# Patient Record
Sex: Male | Born: 1950 | Race: Black or African American | Hispanic: No | Marital: Married | Smoking: Current every day smoker
Health system: Southern US, Community
[De-identification: ages and names within clinical notes are randomized; demographics above are authoritative.]

## PROBLEM LIST (undated history)

## (undated) DIAGNOSIS — F329 Major depressive disorder, single episode, unspecified: Secondary | ICD-10-CM

## (undated) DIAGNOSIS — I10 Essential (primary) hypertension: Principal | ICD-10-CM

## (undated) DIAGNOSIS — C679 Malignant neoplasm of bladder, unspecified: Secondary | ICD-10-CM

## (undated) DIAGNOSIS — J302 Other seasonal allergic rhinitis: Secondary | ICD-10-CM

## (undated) DIAGNOSIS — F32A Depression, unspecified: Secondary | ICD-10-CM

## (undated) DIAGNOSIS — F17200 Nicotine dependence, unspecified, uncomplicated: Secondary | ICD-10-CM

## (undated) DIAGNOSIS — M1990 Unspecified osteoarthritis, unspecified site: Secondary | ICD-10-CM

## (undated) DIAGNOSIS — C61 Malignant neoplasm of prostate: Secondary | ICD-10-CM

## (undated) HISTORY — DX: Essential (primary) hypertension: I10

## (undated) HISTORY — DX: Malignant neoplasm of bladder, unspecified: C67.9

## (undated) HISTORY — PX: HERNIA REPAIR: SHX51

## (undated) HISTORY — DX: Nicotine dependence, unspecified, uncomplicated: F17.200

## (undated) HISTORY — PX: COLONOSCOPY: SHX174

## (undated) HISTORY — DX: Unspecified osteoarthritis, unspecified site: M19.90

## (undated) HISTORY — DX: Other seasonal allergic rhinitis: J30.2

---

## 1998-01-05 DIAGNOSIS — C679 Malignant neoplasm of bladder, unspecified: Secondary | ICD-10-CM

## 1998-01-05 HISTORY — DX: Malignant neoplasm of bladder, unspecified: C67.9

## 1998-01-05 HISTORY — PX: OTHER SURGICAL HISTORY: SHX169

## 2000-01-06 HISTORY — PX: OTHER SURGICAL HISTORY: SHX169

## 2000-05-13 ENCOUNTER — Other Ambulatory Visit: Admission: RE | Admit: 2000-05-13 | Discharge: 2000-05-13 | Payer: Self-pay | Admitting: Urology

## 2000-12-30 ENCOUNTER — Other Ambulatory Visit: Admission: RE | Admit: 2000-12-30 | Discharge: 2000-12-30 | Payer: Self-pay | Admitting: Urology

## 2002-01-05 HISTORY — PX: FOOT SURGERY: SHX648

## 2002-11-16 ENCOUNTER — Encounter: Payer: Self-pay | Admitting: Orthopedic Surgery

## 2002-12-27 ENCOUNTER — Encounter: Payer: Self-pay | Admitting: Orthopedic Surgery

## 2003-01-15 ENCOUNTER — Ambulatory Visit (HOSPITAL_COMMUNITY): Admission: RE | Admit: 2003-01-15 | Discharge: 2003-01-15 | Payer: Self-pay | Admitting: *Deleted

## 2005-03-06 ENCOUNTER — Ambulatory Visit (HOSPITAL_COMMUNITY): Admission: RE | Admit: 2005-03-06 | Discharge: 2005-03-06 | Payer: Self-pay | Admitting: General Surgery

## 2005-10-01 ENCOUNTER — Emergency Department (HOSPITAL_COMMUNITY): Admission: EM | Admit: 2005-10-01 | Discharge: 2005-10-01 | Payer: Self-pay | Admitting: Emergency Medicine

## 2005-10-26 ENCOUNTER — Ambulatory Visit: Payer: Self-pay | Admitting: Family Medicine

## 2005-11-16 ENCOUNTER — Encounter: Payer: Self-pay | Admitting: Family Medicine

## 2005-11-16 LAB — CONVERTED CEMR LAB: PSA: 1.42 ng/mL

## 2005-12-22 ENCOUNTER — Ambulatory Visit: Payer: Self-pay | Admitting: Family Medicine

## 2007-02-09 ENCOUNTER — Ambulatory Visit (HOSPITAL_COMMUNITY): Admission: RE | Admit: 2007-02-09 | Discharge: 2007-02-09 | Payer: Self-pay | Admitting: Family Medicine

## 2007-02-09 ENCOUNTER — Ambulatory Visit: Payer: Self-pay | Admitting: Family Medicine

## 2007-02-10 ENCOUNTER — Encounter: Payer: Self-pay | Admitting: Family Medicine

## 2007-02-10 LAB — CONVERTED CEMR LAB
BUN: 15 mg/dL (ref 6–23)
Basophils Absolute: 0 10*3/uL (ref 0.0–0.1)
Basophils Relative: 1 % (ref 0–1)
CO2: 23 meq/L (ref 19–32)
Calcium: 8.8 mg/dL (ref 8.4–10.5)
Chloride: 106 meq/L (ref 96–112)
Cholesterol: 189 mg/dL (ref 0–200)
Creatinine, Ser: 1.03 mg/dL (ref 0.40–1.50)
Eosinophils Absolute: 0.1 10*3/uL (ref 0.0–0.7)
Eosinophils Relative: 1 % (ref 0–5)
Glucose, Bld: 111 mg/dL — ABNORMAL HIGH (ref 70–99)
HCT: 47.4 % (ref 39.0–52.0)
HDL: 47 mg/dL (ref >39)
Hemoglobin: 15.5 g/dL (ref 13.0–17.0)
LDL Cholesterol: 127 mg/dL — ABNORMAL HIGH (ref 0–99)
Lymphocytes Relative: 29 % (ref 12–46)
Lymphs Abs: 1.9 10*3/uL (ref 0.7–4.0)
MCHC: 32.7 g/dL (ref 30.0–36.0)
MCV: 91.2 fL (ref 78.0–100.0)
Monocytes Absolute: 0.5 10*3/uL (ref 0.1–1.0)
Monocytes Relative: 8 % (ref 3–12)
Neutro Abs: 4 10*3/uL (ref 1.7–7.7)
Neutrophils Relative %: 62 % (ref 43–77)
PSA: 1.37 ng/mL (ref 0.10–4.00)
Platelets: 236 10*3/uL (ref 150–400)
Potassium: 4.2 meq/L (ref 3.5–5.3)
RBC: 5.2 M/uL (ref 4.22–5.81)
RDW: 13.8 % (ref 11.5–15.5)
Sodium: 142 meq/L (ref 135–145)
Total CHOL/HDL Ratio: 4
Triglycerides: 73 mg/dL (ref <150)
VLDL: 15 mg/dL (ref 0–40)
WBC: 6.5 10*3/uL (ref 4.0–10.5)

## 2007-02-11 ENCOUNTER — Encounter: Payer: Self-pay | Admitting: Family Medicine

## 2007-02-11 LAB — CONVERTED CEMR LAB: Hgb A1c MFr Bld: 6.4 % — ABNORMAL HIGH (ref 4.6–6.1)

## 2007-02-22 ENCOUNTER — Ambulatory Visit (HOSPITAL_COMMUNITY): Admission: RE | Admit: 2007-02-22 | Discharge: 2007-02-22 | Payer: Self-pay | Admitting: General Surgery

## 2007-02-22 ENCOUNTER — Encounter: Payer: Self-pay | Admitting: Family Medicine

## 2007-02-22 LAB — HM COLONOSCOPY: HM Colonoscopy: NORMAL

## 2007-02-25 ENCOUNTER — Encounter: Payer: Self-pay | Admitting: Family Medicine

## 2007-02-25 LAB — CONVERTED CEMR LAB
Glucose, 2 hour: 88 mg/dL (ref 70–139)
Glucose, Fasting: 115 mg/dL — ABNORMAL HIGH (ref 70–99)

## 2007-04-11 ENCOUNTER — Ambulatory Visit (HOSPITAL_COMMUNITY): Admission: RE | Admit: 2007-04-11 | Discharge: 2007-04-11 | Payer: Self-pay | Admitting: Family Medicine

## 2007-04-11 ENCOUNTER — Ambulatory Visit: Payer: Self-pay | Admitting: Family Medicine

## 2007-04-11 LAB — CONVERTED CEMR LAB
BUN: 11 mg/dL (ref 6–23)
Basophils Absolute: 0 10*3/uL (ref 0.0–0.1)
Basophils Relative: 1 % (ref 0–1)
CO2: 27 meq/L (ref 19–32)
Calcium: 8.7 mg/dL (ref 8.4–10.5)
Chloride: 102 meq/L (ref 96–112)
Creatinine, Ser: 1.06 mg/dL (ref 0.40–1.50)
Eosinophils Absolute: 0.1 10*3/uL (ref 0.0–0.7)
Eosinophils Relative: 2 % (ref 0–5)
Glucose, Bld: 156 mg/dL — ABNORMAL HIGH (ref 70–99)
HCT: 43.7 % (ref 39.0–52.0)
Hemoglobin: 14.8 g/dL (ref 13.0–17.0)
Lymphocytes Relative: 39 % (ref 12–46)
Lymphs Abs: 1.7 10*3/uL (ref 0.7–4.0)
MCHC: 33.9 g/dL (ref 30.0–36.0)
MCV: 89.7 fL (ref 78.0–100.0)
Monocytes Absolute: 0.5 10*3/uL (ref 0.1–1.0)
Monocytes Relative: 10 % (ref 3–12)
Neutro Abs: 2.1 10*3/uL (ref 1.7–7.7)
Neutrophils Relative %: 48 % (ref 43–77)
Platelets: 215 10*3/uL (ref 150–400)
Potassium: 3.8 meq/L (ref 3.5–5.3)
RBC: 4.87 M/uL (ref 4.22–5.81)
RDW: 14.1 % (ref 11.5–15.5)
Sodium: 136 meq/L (ref 135–145)
WBC: 4.4 10*3/uL (ref 4.0–10.5)

## 2007-04-12 ENCOUNTER — Encounter: Payer: Self-pay | Admitting: Family Medicine

## 2007-04-12 LAB — CONVERTED CEMR LAB: Hgb A1c MFr Bld: 6.2 % — ABNORMAL HIGH

## 2007-04-13 ENCOUNTER — Encounter: Payer: Self-pay | Admitting: Family Medicine

## 2007-04-13 DIAGNOSIS — F17200 Nicotine dependence, unspecified, uncomplicated: Secondary | ICD-10-CM

## 2007-04-13 DIAGNOSIS — C679 Malignant neoplasm of bladder, unspecified: Secondary | ICD-10-CM | POA: Insufficient documentation

## 2007-10-14 ENCOUNTER — Ambulatory Visit: Payer: Self-pay | Admitting: Family Medicine

## 2007-10-14 DIAGNOSIS — R5381 Other malaise: Secondary | ICD-10-CM

## 2007-10-14 DIAGNOSIS — R7303 Prediabetes: Secondary | ICD-10-CM

## 2007-10-14 DIAGNOSIS — R5383 Other fatigue: Secondary | ICD-10-CM

## 2007-10-19 ENCOUNTER — Encounter: Payer: Self-pay | Admitting: Family Medicine

## 2007-10-19 LAB — CONVERTED CEMR LAB
BUN: 19 mg/dL (ref 6–23)
Basophils Absolute: 0 10*3/uL (ref 0.0–0.1)
Basophils Relative: 1 % (ref 0–1)
CO2: 22 meq/L (ref 19–32)
Calcium: 8.9 mg/dL (ref 8.4–10.5)
Chloride: 109 meq/L (ref 96–112)
Cholesterol: 209 mg/dL — ABNORMAL HIGH (ref 0–200)
Creatinine, Ser: 1.09 mg/dL (ref 0.40–1.50)
Eosinophils Absolute: 0.1 10*3/uL (ref 0.0–0.7)
Eosinophils Relative: 2 % (ref 0–5)
Glucose, Bld: 96 mg/dL (ref 70–99)
HCT: 47 % (ref 39.0–52.0)
HDL: 47 mg/dL (ref >39)
Hemoglobin: 15.4 g/dL (ref 13.0–17.0)
LDL Cholesterol: 146 mg/dL — ABNORMAL HIGH (ref 0–99)
Lymphocytes Relative: 39 % (ref 12–46)
Lymphs Abs: 2.2 10*3/uL (ref 0.7–4.0)
MCHC: 32.8 g/dL (ref 30.0–36.0)
MCV: 91.4 fL (ref 78.0–100.0)
Monocytes Absolute: 0.4 10*3/uL (ref 0.1–1.0)
Monocytes Relative: 7 % (ref 3–12)
Neutro Abs: 2.8 10*3/uL (ref 1.7–7.7)
Neutrophils Relative %: 51 % (ref 43–77)
Platelets: 197 10*3/uL (ref 150–400)
Potassium: 4.3 meq/L (ref 3.5–5.3)
RBC: 5.14 M/uL (ref 4.22–5.81)
RDW: 14 % (ref 11.5–15.5)
Sodium: 143 meq/L (ref 135–145)
Total CHOL/HDL Ratio: 4.4
Triglycerides: 81 mg/dL (ref <150)
VLDL: 16 mg/dL (ref 0–40)
WBC: 5.5 10*3/uL (ref 4.0–10.5)

## 2007-11-01 ENCOUNTER — Telehealth: Payer: Self-pay | Admitting: Family Medicine

## 2008-03-22 ENCOUNTER — Telehealth: Payer: Self-pay | Admitting: Family Medicine

## 2008-08-14 ENCOUNTER — Encounter: Payer: Self-pay | Admitting: Family Medicine

## 2008-08-16 ENCOUNTER — Ambulatory Visit: Payer: Self-pay | Admitting: Family Medicine

## 2008-08-16 LAB — CONVERTED CEMR LAB
Bilirubin Urine: NEGATIVE
Blood in Urine, dipstick: NEGATIVE
Glucose, Urine, Semiquant: NEGATIVE
Nitrite: NEGATIVE
Protein, U semiquant: NEGATIVE
Specific Gravity, Urine: 1.02
Urobilinogen, UA: 0.2
WBC Urine, dipstick: NEGATIVE
pH: 5

## 2008-08-17 DIAGNOSIS — E785 Hyperlipidemia, unspecified: Secondary | ICD-10-CM | POA: Insufficient documentation

## 2008-08-31 ENCOUNTER — Encounter: Payer: Self-pay | Admitting: Family Medicine

## 2008-09-03 LAB — CONVERTED CEMR LAB
BUN: 14 mg/dL (ref 6–23)
Basophils Absolute: 0 10*3/uL (ref 0.0–0.1)
Basophils Relative: 0 % (ref 0–1)
CO2: 24 meq/L (ref 19–32)
Calcium: 8.9 mg/dL (ref 8.4–10.5)
Chloride: 108 meq/L (ref 96–112)
Cholesterol: 163 mg/dL (ref 0–200)
Creatinine, Ser: 0.95 mg/dL (ref 0.40–1.50)
Eosinophils Absolute: 0.1 10*3/uL (ref 0.0–0.7)
Eosinophils Relative: 2 % (ref 0–5)
Glucose, Bld: 95 mg/dL (ref 70–99)
HCT: 40.9 % (ref 39.0–52.0)
HDL: 42 mg/dL (ref >39)
Hemoglobin: 13.1 g/dL (ref 13.0–17.0)
LDL Cholesterol: 109 mg/dL — ABNORMAL HIGH (ref 0–99)
Lymphocytes Relative: 34 % (ref 12–46)
Lymphs Abs: 1.9 10*3/uL (ref 0.7–4.0)
MCHC: 32 g/dL (ref 30.0–36.0)
MCV: 92.1 fL (ref 78.0–100.0)
Monocytes Absolute: 0.4 10*3/uL (ref 0.1–1.0)
Monocytes Relative: 6 % (ref 3–12)
Neutro Abs: 3.1 10*3/uL (ref 1.7–7.7)
Neutrophils Relative %: 57 % (ref 43–77)
Platelets: 208 10*3/uL (ref 150–400)
Potassium: 4.1 meq/L (ref 3.5–5.3)
RBC: 4.44 M/uL (ref 4.22–5.81)
RDW: 13.5 % (ref 11.5–15.5)
Sodium: 141 meq/L (ref 135–145)
TSH: 2.168 microintl units/mL (ref 0.350–4.500)
Total CHOL/HDL Ratio: 3.9
Triglycerides: 59 mg/dL (ref <150)
VLDL: 12 mg/dL (ref 0–40)
WBC: 5.5 10*3/uL (ref 4.0–10.5)

## 2008-10-02 ENCOUNTER — Ambulatory Visit: Payer: Self-pay | Admitting: Family Medicine

## 2009-01-03 ENCOUNTER — Ambulatory Visit: Payer: Self-pay | Admitting: Family Medicine

## 2009-07-06 ENCOUNTER — Emergency Department (HOSPITAL_COMMUNITY): Admission: EM | Admit: 2009-07-06 | Discharge: 2009-07-06 | Payer: Self-pay | Admitting: Emergency Medicine

## 2009-09-19 ENCOUNTER — Ambulatory Visit: Payer: Self-pay | Admitting: Family Medicine

## 2009-09-19 DIAGNOSIS — J309 Allergic rhinitis, unspecified: Secondary | ICD-10-CM

## 2009-09-19 DIAGNOSIS — K625 Hemorrhage of anus and rectum: Secondary | ICD-10-CM | POA: Insufficient documentation

## 2009-09-19 LAB — CONVERTED CEMR LAB: OCCULT 1: NEGATIVE

## 2009-09-23 ENCOUNTER — Telehealth: Payer: Self-pay | Admitting: Physician Assistant

## 2009-10-15 ENCOUNTER — Telehealth: Payer: Self-pay | Admitting: Family Medicine

## 2009-10-21 ENCOUNTER — Ambulatory Visit: Payer: Self-pay | Admitting: Family Medicine

## 2009-10-21 DIAGNOSIS — L909 Atrophic disorder of skin, unspecified: Secondary | ICD-10-CM | POA: Insufficient documentation

## 2009-10-21 DIAGNOSIS — L919 Hypertrophic disorder of the skin, unspecified: Secondary | ICD-10-CM

## 2009-10-22 ENCOUNTER — Telehealth (INDEPENDENT_AMBULATORY_CARE_PROVIDER_SITE_OTHER): Payer: Self-pay | Admitting: *Deleted

## 2009-10-24 ENCOUNTER — Telehealth: Payer: Self-pay | Admitting: Family Medicine

## 2009-10-24 ENCOUNTER — Encounter: Payer: Self-pay | Admitting: Family Medicine

## 2009-10-24 LAB — CONVERTED CEMR LAB
BUN: 19 mg/dL (ref 6–23)
Basophils Absolute: 0 10*3/uL (ref 0.0–0.1)
Basophils Relative: 1 % (ref 0–1)
CO2: 23 meq/L (ref 19–32)
Calcium: 8.7 mg/dL (ref 8.4–10.5)
Chloride: 108 meq/L (ref 96–112)
Cholesterol: 188 mg/dL (ref 0–200)
Creatinine, Ser: 1.04 mg/dL (ref 0.40–1.50)
Eosinophils Absolute: 0.1 10*3/uL (ref 0.0–0.7)
Eosinophils Relative: 1 % (ref 0–5)
Glucose, Bld: 94 mg/dL (ref 70–99)
HCT: 43.1 % (ref 39.0–52.0)
HDL: 48 mg/dL (ref >39)
Hemoglobin: 14.3 g/dL (ref 13.0–17.0)
LDL Cholesterol: 129 mg/dL — ABNORMAL HIGH (ref 0–99)
Lymphocytes Relative: 31 % (ref 12–46)
Lymphs Abs: 2 10*3/uL (ref 0.7–4.0)
MCHC: 33.2 g/dL (ref 30.0–36.0)
MCV: 90.9 fL (ref 78.0–100.0)
Monocytes Absolute: 0.4 10*3/uL (ref 0.1–1.0)
Monocytes Relative: 6 % (ref 3–12)
Neutro Abs: 3.8 10*3/uL (ref 1.7–7.7)
Neutrophils Relative %: 61 % (ref 43–77)
PSA: 3.4 ng/mL (ref 0.10–4.00)
Platelets: 221 10*3/uL (ref 150–400)
Potassium: 4.3 meq/L (ref 3.5–5.3)
RBC: 4.74 M/uL (ref 4.22–5.81)
RDW: 13.7 % (ref 11.5–15.5)
Sodium: 142 meq/L (ref 135–145)
TSH: 1.391 microintl units/mL (ref 0.350–4.500)
Total CHOL/HDL Ratio: 3.9
Triglycerides: 55 mg/dL (ref <150)
VLDL: 11 mg/dL (ref 0–40)
WBC: 6.3 10*3/uL (ref 4.0–10.5)

## 2009-10-27 DIAGNOSIS — M25569 Pain in unspecified knee: Secondary | ICD-10-CM

## 2009-11-26 ENCOUNTER — Ambulatory Visit: Payer: Self-pay | Admitting: Orthopedic Surgery

## 2009-11-26 DIAGNOSIS — M1710 Unilateral primary osteoarthritis, unspecified knee: Secondary | ICD-10-CM

## 2009-11-27 ENCOUNTER — Encounter: Payer: Self-pay | Admitting: Orthopedic Surgery

## 2009-12-17 ENCOUNTER — Ambulatory Visit (HOSPITAL_COMMUNITY)
Admission: RE | Admit: 2009-12-17 | Discharge: 2009-12-17 | Payer: Self-pay | Source: Home / Self Care | Attending: Urology | Admitting: Urology

## 2009-12-19 ENCOUNTER — Ambulatory Visit: Payer: Self-pay | Admitting: Family Medicine

## 2009-12-24 ENCOUNTER — Encounter: Payer: Self-pay | Admitting: Orthopedic Surgery

## 2010-01-09 LAB — BASIC METABOLIC PANEL
BUN: 11 mg/dL (ref 6–23)
CO2: 29 mEq/L (ref 19–32)
Calcium: 8.9 mg/dL (ref 8.4–10.5)
Chloride: 106 mEq/L (ref 96–112)
Creatinine, Ser: 1.04 mg/dL (ref 0.4–1.5)
GFR calc Af Amer: >60 mL/min (ref >60)
GFR calc non Af Amer: >60 mL/min (ref >60)
Glucose, Bld: 121 mg/dL — ABNORMAL HIGH (ref 70–99)
Potassium: 3.9 mEq/L (ref 3.5–5.1)
Sodium: 142 mEq/L (ref 135–145)

## 2010-01-09 LAB — CBC
HCT: 42.9 % (ref 39.0–52.0)
Hemoglobin: 14.5 g/dL (ref 13.0–17.0)
MCH: 30.3 pg (ref 26.0–34.0)
MCHC: 33.8 g/dL (ref 30.0–36.0)
MCV: 89.6 fL (ref 78.0–100.0)
Platelets: 180 10*3/uL (ref 150–400)
RBC: 4.79 MIL/uL (ref 4.22–5.81)
RDW: 13.9 % (ref 11.5–15.5)
WBC: 4.8 10*3/uL (ref 4.0–10.5)

## 2010-01-13 ENCOUNTER — Ambulatory Visit (HOSPITAL_COMMUNITY)
Admission: RE | Admit: 2010-01-13 | Discharge: 2010-01-13 | Payer: Self-pay | Source: Home / Self Care | Attending: Urology | Admitting: Urology

## 2010-01-21 ENCOUNTER — Encounter: Payer: Self-pay | Admitting: Family Medicine

## 2010-01-21 ENCOUNTER — Ambulatory Visit: Admit: 2010-01-21 | Payer: Self-pay | Admitting: Family Medicine

## 2010-01-22 ENCOUNTER — Telehealth: Payer: Self-pay | Admitting: Orthopedic Surgery

## 2010-01-23 ENCOUNTER — Encounter: Payer: Self-pay | Admitting: Orthopedic Surgery

## 2010-01-27 ENCOUNTER — Encounter: Payer: Self-pay | Admitting: Family Medicine

## 2010-01-27 NOTE — H&P (Signed)
NAMEMODESTO, GANOE              ACCOUNT NO.:  [id number]  MEDICAL RECORD NO.:  [id number]          PATIENT TYPE:  AMB  LOCATION:  DAY                           FACILITY:  [facility]  PHYSICIAN:  Dennie Maizes, M.D.   DATE OF BIRTH:  [birth year]  DATE OF ADMISSION:  01/13/2010 DATE OF DISCHARGE:  LH                             HISTORY & PHYSICAL   He is coming to the short-stay center on January 13, 2010.  CHIEF COMPLAINT:  History of carcinoma bladder, suspicious urine cytology.  HISTORY OF PRESENT ILLNESS:  This 60 year old male has been under my care for several years.  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  Initial diagnosis was made in 2000, and the patient was treated with transurethral resection of the bladder tumors.  He had a bladder tumor recurrence in 2001.  He received  intravesical BCG treatment.  He had been doing well until recently.  His urine cytology revealed atypical urinary cells suspicious for malignancy, severely dysplastic cells are noted.  Cystoscopy was done under local anesthesia in the office.  There was moderate hypertrophy of the prostate with partial obscuration of the bladder neck area.  The bladder revealed no evidence of recurrent bladder tumor. However, there is erythema noted in the posterior wall of the bladder. It is noted on the left side.  The patient is brought to the operating room today for cystoscopy, bladder biopsy, retrograde pyelogram to rule out upper tract disease.  He is voiding well at present.  He denied having any fever, chills, abdominal pain, or hematuria.  PAST MEDICAL HISTORY:  History of non-muscle invasive carcinoma of the bladder, status post TURBT, chronic prostatitis, impotence, status post atherectomy knee surgery, status post left inguinal hernia repair.  MEDICATIONS:  None.  ALLERGIES:  None.  FAMILY HISTORY:  Unremarkable.  REVIEW OF SYSTEMS:  No history of chest pain or  shortness of breath  PERSONAL HISTORY:  The patient is a smoker.  He drinks occasionally.  SOCIAL HISTORY:  He is married.  PHYSICAL EXAMINATION:  HEENT:  Normal. LUNGS:  Clear to auscultation. HEART:  Regular rate and rhythm.  No murmurs. ABDOMEN:  Soft.  No palpable flank mass or CVA tenderness.  Bladder is not palpable. GU:  Penis and testes are normally. RECTAL:  Benign prostate, about 44-43 grams in size.  IMPRESSION:  History of carcinoma of the bladder, suspicious urine cytology.  PLAN: 1. I have discussed with the patient regarding the significance of     suspicious urine cytology and the need to rule out carcinoma in     situ.  He is brought to the short-stay center today for cystoscopy,     multiple bladder biopsies, bilateral retrograde pyelograms under     anesthesia.  I have discussed with the patient regarding the     diagnosis, operative details, outpatient treatment, outcome,     possible risks and complications, and he has agreed for the     procedure to be done.     Dennie Maizes, M.D.     SK/MEDQ  D:  01/12/2010  T:  01/12/2010  Job:  [id number]  cc:   Dr. Loleta Chance, [facility] Fax: [phone]  Electronically Signed by Dennie Maizes M.D. on 01/27/2010 09:37:19 AM

## 2010-01-27 NOTE — Op Note (Signed)
  Matthew Adkins, Matthew Adkins              ACCOUNT NO.:  [id number]  MEDICAL RECORD NO.:  [id number]          PATIENT TYPE:  AMB  LOCATION:  DAY                           FACILITY:  [facility]  PHYSICIAN:  Dennie Maizes, M.D.   DATE OF BIRTH:  04/21/1950  DATE OF PROCEDURE:  01/13/2010 DATE OF DISCHARGE:                              OPERATIVE REPORT   PREOPERATIVE DIAGNOSES: 1. History of non-muscle invasive carcinoma of bladder. 2. Abnormal urine cytology.  POSTOPERATIVE DIAGNOSES: 1. History of non-muscle invasive carcinoma of bladder. 2. Abnormal urine cytology.  OPERATION:  Cystoscopy, bilateral retrograde pyelograms, multiple bladder biopsies, and fulguration.  ANESTHESIA:  Spinal.  SURGEON:  Dennie Maizes, MD  COMPLICATIONS:  None.  ESTIMATED BLOOD LOSS:  Minimal.  SPECIMEN:  Bladder biopsies x4.  DRAINS:  None.  INDICATIONS FOR PROCEDURE:  This 60 year old male has a history of non- muscle invasive carcinoma of bladder.  He has been treated with intravesical BCG in the past.  BCG demonstrated cytology of normal size, suspicious malignant cells.  Cystoscopy in the office revealed a small pustular erythema on the left lateral of the bladder.  The patient was taken to the operating room today for further evaluation of cystoscopy by retrograde pyelograms, multiple bladder biopsies, possible fulguration of bladder tumor.  DESCRIPTION OF PROCEDURE:  Spinal anesthesia was induced and the patient was placed on the OR table in the dorsal lithotomy position.  The lower abdomen and genitalia were prepped and draped in a sterile fashion. Cystoscopy was done with a 20-French scope.  Urethra was normal.  There was moderate hypertrophy of the prostate with partial obstruction in the bladder neck area.  The bladder was then examined and found to be mildly trabeculated.  The trigone and ureteral orifice were normal.  There was a small paraureteral diverticulum on the right lateral wall  of the bladder.  There was a small patch of erythema on the left lateral wall of bladder.  There was no evidence of recurrent bladder tumor.  With rigid biopsy forceps, mucosal biopsies were done from the right lateral wall, posterior left lateral wall, and trigone of the bladder. The biopsy on the left lateral wall was done on the patchy erythema. The biopsy sites were then fulgurated using a Greenwald electrode.  Complete hemostasis was obtained.  There was no active bleeding.  Instruments were removed.  The patient was transferred to the PACU in a satisfactory condition.     Dennie Maizes, M.D.     SK/MEDQ  D:  01/13/2010  T:  01/14/2010  Job:  [id number]  cc:   Milus Mallick. Lodema Hong, M.D. Fax: [phone]  Electronically Signed by Dennie Maizes M.D. on 01/27/2010 09:38:57 AM

## 2010-01-29 ENCOUNTER — Telehealth: Payer: Self-pay | Admitting: Family Medicine

## 2010-01-29 ENCOUNTER — Telehealth (INDEPENDENT_AMBULATORY_CARE_PROVIDER_SITE_OTHER): Payer: Self-pay | Admitting: *Deleted

## 2010-01-30 LAB — SURGICAL PCR SCREEN
MRSA, PCR: NEGATIVE
Staphylococcus aureus: NEGATIVE

## 2010-01-31 ENCOUNTER — Emergency Department (HOSPITAL_COMMUNITY)
Admission: EM | Admit: 2010-01-31 | Discharge: 2010-01-31 | Payer: Self-pay | Source: Home / Self Care | Admitting: Emergency Medicine

## 2010-01-31 LAB — COMPREHENSIVE METABOLIC PANEL
ALT: 20 U/L (ref 0–53)
AST: 21 U/L (ref 0–37)
Albumin: 3.9 g/dL (ref 3.5–5.2)
Alkaline Phosphatase: 51 U/L (ref 39–117)
BUN: 17 mg/dL (ref 6–23)
CO2: 26 mEq/L (ref 19–32)
Calcium: 8.6 mg/dL (ref 8.4–10.5)
Chloride: 105 mEq/L (ref 96–112)
Creatinine, Ser: 1.05 mg/dL (ref 0.4–1.5)
GFR calc Af Amer: >60 mL/min (ref >60)
GFR calc non Af Amer: >60 mL/min (ref >60)
Glucose, Bld: 86 mg/dL (ref 70–99)
Potassium: 3.9 mEq/L (ref 3.5–5.1)
Sodium: 140 mEq/L (ref 135–145)
Total Bilirubin: 0.5 mg/dL (ref 0.3–1.2)
Total Protein: 6.7 g/dL (ref 6.0–8.3)

## 2010-01-31 LAB — DIFFERENTIAL
Basophils Absolute: 0 10*3/uL (ref 0.0–0.1)
Basophils Relative: 1 % (ref 0–1)
Eosinophils Absolute: 0.1 10*3/uL (ref 0.0–0.7)
Eosinophils Relative: 2 % (ref 0–5)
Lymphocytes Relative: 34 % (ref 12–46)
Lymphs Abs: 1.9 10*3/uL (ref 0.7–4.0)
Monocytes Absolute: 0.5 10*3/uL (ref 0.1–1.0)
Monocytes Relative: 8 % (ref 3–12)
Neutro Abs: 3 10*3/uL (ref 1.7–7.7)
Neutrophils Relative %: 54 % (ref 43–77)

## 2010-01-31 LAB — CBC
HCT: 38.1 % — ABNORMAL LOW (ref 39.0–52.0)
Hemoglobin: 13 g/dL (ref 13.0–17.0)
MCH: 30.6 pg (ref 26.0–34.0)
MCHC: 34.1 g/dL (ref 30.0–36.0)
MCV: 89.6 fL (ref 78.0–100.0)
Platelets: 182 10*3/uL (ref 150–400)
RBC: 4.25 MIL/uL (ref 4.22–5.81)
RDW: 13.9 % (ref 11.5–15.5)
WBC: 5.4 10*3/uL (ref 4.0–10.5)

## 2010-01-31 LAB — POCT CARDIAC MARKERS
CKMB, poc: 2 ng/mL (ref 1.0–8.0)
Myoglobin, poc: 127 ng/mL (ref 12–200)
Troponin i, poc: <0.05 ng/mL (ref 0.00–0.09)

## 2010-02-02 LAB — CONVERTED CEMR LAB: OCCULT 1: NEGATIVE

## 2010-02-03 ENCOUNTER — Encounter: Payer: Self-pay | Admitting: Family Medicine

## 2010-02-04 NOTE — Assessment & Plan Note (Signed)
Summary: OV   Vital Signs:  Patient profile:   60 year old male Height:      73 inches Weight:      209.75 pounds BMI:     27.77 O2 Sat:      100 % on Room air Pulse rate:   57 / minute Pulse rhythm:   regular Resp:     16 per minute BP sitting:   130 / 90  (right arm) Cuff size:   regular  Vitals Entered By: Mauricia Area CMA (October 21, 2009 10:43 AM)  Nutrition Counseling: Patient's BMI is greater than 25 and therefore counseled on weight management options.  O2 Flow:  Room air CC: follow up   Primary Care Provider:  Syliva Overman MD  CC:  follow up.  History of Present Illness: Reports  that he has been doing fairly well.. Denies recent fever or chills. Denies sinus pressure, nasal congestion , ear pain or sore throat. Denies chest congestion, or cough productive of sputum. Denies chest pain, palpitations, PND, orthopnea or leg swelling. Denies abdominal pain, nausea, vomitting, diarrhea or constipation. Denies change in bowel movements or bloody stool. Denies dysuria , frequency, incontinence or hesitancy. c/o roght knee  pain, with tenderness and  reduced mobility. Denies headaches, vertigo, seizures. Denies depression, anxiety or insomnia. c/o skin tag under eye, wants it removed.   Current Medications (verified): 1)  Aspirin 81 Mg Tbec (Aspirin) .... Take 1 Tablet By Mouth Once A Day 2)  Centrum .Marland Kitchen.. 1 Tab Once A Day  Allergies (verified): 1)  ! Codeine  Review of Systems      See HPI General:  Complains of sleep disorder; knee painn disturbing sleep. GI:  Complains of bloody stools; intermittent painless rectal bleeding x 1 month. MS:  Complains of joint pain; right knee pain x 2 weeks medial aspect, esp at night,brace helps, no recent trauma. Derm:  Complains of lesion(s); skin tag under right eye wants it removed. Endo:  Denies cold intolerance, excessive thirst, excessive urination, and heat intolerance. Heme:  Denies abnormal bruising and  bleeding. Allergy:  Complains of seasonal allergies; denies hives or rash and itching eyes.  Physical Exam  General:  Well-developed,well-nourished,in no acute distress; alert,appropriate and cooperative throughout examination HEENT: No facial asymmetry,  EOMI, No sinus tenderness, TM's Clear, oropharynx  pink and moist.   Chest: Clear to auscultation bilaterally.Decreased air entry bilaterally  CVS: S1, S2, No murmurs, No S3.   Abd: Soft, Nontender.  MS: Adequate ROM spine, hips, shoulders and reduced in right knee which is tender over medial aspect  Ext: No edema.   CNS: CN 2-12 intact, power tone and sensation normal throughout.   Skin: Intact, skin tag under right eye  Psych: Good eye contact, normal affect.  Memory intact, not anxious or depressed appearing.    Impression & Recommendations:  Problem # 1:  SKIN TAG (ICD-701.9) Assessment Comment Only  Future Orders: Dermatology Referral (Derma) ... 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   Problem # 4:  KNEE PAIN, RIGHT, ACUTE (ICD-719.46) Assessment: Deteriorated  His updated medication list for this problem includes:    Aspirin 81 Mg Tbec (Aspirin) .Marland Kitchen... Take 1 tablet by mouth once a day    Ibuprofen 800 Mg Tabs (Ibuprofen) .Marland Kitchen... Take 1 tablet by mouth three times a day for  7 days, then 1 to tabs daily  as needed for uncontrolled pain  Complete Medication List: 1)  Aspirin 81 Mg Tbec (Aspirin) .... Take 1 tablet by mouth once a day 2)  Centrum  .Marland Kitchen.. 1 tab once a day 3)  Ibuprofen 800 Mg Tabs (Ibuprofen) .... Take 1 tablet by mouth three times a day for 7 days, then 1 to tabs daily  as needed for uncontrolled pain  Other Orders: T-Basic Metabolic Panel [phone]) T-Lipid Profile [id number]) T-CBC w/Diff  [phone]) T-TSH [phone]) T-PSA [phone]) Tdap => 39yrs IM (02725) Admin 1st Vaccine (36644) Gastroenterology Referral (GI)  Patient Instructions: 1)  CPe in 3 months. 2)  BMP prior to visit, ICD-9: 3)  Lipid Panel prior to visit, ICD-9: 4)  TSH prior to visit, ICD-9:   fasting this week pls 5)  CBC w/ Diff prior to visit, ICD-9:, vit D 6)  PSA prior to visit, ICD-9:, h pylori, dyspepsia 7)  you wwill get a script for vimovo to use for 1 week for the right knee pain, pls call if it continues..you will be refered to dermatology for the skin tag  to be removed. 8)  tdAp today 9)  you will be referred to GI to eval new rectal bleeding Prescriptions: VIMOVO 500-20 MG TBEC (NAPROXEN-ESOMEPRAZOLE) Take 1 tablet by mouth two times a day for 1 week, then as needed  #60 x 0   Entered and Authorized by:   Syliva Overman MD   Signed by:   Syliva Overman MD on 10/21/2009   Method used:   Print then Give to Patient   RxID:   [id number]    Orders Added: 1)  Est. Patient Level IV [33295] 2)  T-Basic Metabolic Panel [[id number]] 3)  T-Lipid Profile [[id number]] 4)  T-CBC w/Diff [[id number]] 5)  T-TSH [[id number]] 6)  T-PSA [31517-61607] 7)  Tdap => 74yrs IM [37106] 8)  Admin 1st Vaccine [26948] 9)  Gastroenterology Referral [GI] 10)  Dermatology Referral [Derma]   Immunizations Administered:  Tetanus Vaccine:    Vaccine Type: Tdap    Site: right deltoid    Mfr: GlaxoSmithKline    Dose: 0.5 ml    Route: IM    Given by: Adella Hare LPN    Exp. Date: 10/25/2011    Lot #: [id number]    VIS given: 11/23/07 version given October 21, 2009.   Immunizations Administered:  Tetanus Vaccine:    Vaccine Type: Tdap    Site: right deltoid    Mfr: GlaxoSmithKline    Dose: 0.5 ml    Route: IM    Given by: Adella Hare LPN    Exp. Date: 10/25/2011    Lot #: [id number]    VIS given: 11/23/07 version given October 21, 2009.

## 2010-02-04 NOTE — Assessment & Plan Note (Signed)
Summary: KNEE PAIN/NEEDS XRAYS/BCBS/CAF   Vital Signs:  Patient profile:   60 year old male Height:      75 inches Weight:      215 pounds Pulse rate:   76 / minute Resp:     16 per minute  Vitals Entered By: Fuller Canada MD (November 26, 2009 4:12 PM)  Visit Type:  new patient Referring Juleen Sorrels:  self Primary Mariabella Nilsen:  Syliva Overman MD  CC:  right knee pain.  History of Present Illness: This is a 60 year old male presents with RIGHT knee pain primarily over the medial aspect which he describes as burning 5/10 intermittent pain which came on gradually and has progressed over the years and especially over the last month.  He denies any swelling but no stiffness after sitting.  He says there is a catch in his knee no locking.  He did have arthroscopy RIGHT knee proximal 14 years ago for torn cartilage.  He sees a [facility] for his back.  Treatment so far includes Aleve and ibuprofen and a knee strap.  Allergies: 1)  ! Codeine  Family History: Mother died brain tumor Father died heart attack, in his late 7's , this was when he was discovered to have heart disease 5 living sisters some of whom are HTN, And Diabetic One sister died of aneurysm in her brain 1 living brother who is healthy Family History of Arthritis  Social History: unemployed Married Two children 36 and 28 Current Smoker Alcohol use-yes Drug use-no caffeine yes 12th grade  Review of Systems Constitutional:  Denies weight loss, weight gain, fever, chills, and fatigue. Cardiovascular:  Denies chest pain, palpitations, fainting, and murmurs. Respiratory:  Denies short of breath, wheezing, couch, tightness, pain on inspiration, and snoring . Gastrointestinal:  Denies heartburn, nausea, vomiting, diarrhea, constipation, and blood in your stools. Genitourinary:  Denies frequency, urgency, difficulty urinating, painful urination, flank pain, and bleeding in urine. Neurologic:  Denies numbness,  tingling, unsteady gait, dizziness, tremors, and seizure. Musculoskeletal:  Complains of joint pain and stiffness; denies swelling, instability, redness, heat, and muscle pain. Endocrine:  Denies excessive thirst, exessive urination, and heat or cold intolerance. Psychiatric:  Denies nervousness, depression, anxiety, and hallucinations. Skin:  Denies changes in the skin, poor healing, rash, itching, and redness. HEENT:  Denies blurred or double vision, eye pain, redness, and watering. Immunology:  Denies seasonal allergies, sinus problems, and allergic to bee stings. Hemoatologic:  Denies easy bleeding and brusing.  Physical Exam  Additional Exam:  GEN: well developed, well nourished, normal grooming and hygiene, no deformity and normal body habitus.   CDV: pulses are normal, no edema, no erythema. no tenderness  Lymph: normal lymph nodes   Skin: no rashes, skin lesions or open sores   NEURO: normal coordination, reflexes, sensation.   Psyche: awake, alert and oriented. Mood normal   upper extremities are well lined without contracture subluxation atrophy or tremor.  RIGHT lower extremity tenderness is noted over the medial joint line with mild varus alignment.  He has a mild to moderate joint effusion on the RIGHT knee He has a lack of extension of about 5 he has full flexion.  He has normal strength in his knee and his knee is stable and has a negative McMurray sign.  His LEFT knee is very similar with minimal joint line tenderness full range of motion normal strength and ligamentous stability.   Impression & Recommendations:  Problem # 1:  DEGENERATIVE JOINT DISEASE, RIGHT KNEE (ICD-715.96) Assessment New  x-rays are obtained of the RIGHT knee AP lateral and tangential view of the RIGHT patella.  There is mild varus deformity multiple loose bodies which appear to be chondrocalcinosis type lesions.  He also has significant medial joint line joint space narrowing with  patellofemoral joint space narrowing  Impression severe arthritis RIGHT knee  I discussed this with the patient he seems to have minimal symptoms at least looking at the x-ray you would expect more problems at this time.  I did tell him that he will come to knee replacement in the future but for now he should continue to exercise, keep his weight down and take medication as needed  Aspiration injection RIGHT knee  We aspirated approximately 15 cc of clear yellow fluid and then injected the knee as follows Verbal consent was obtained. The knee was prepped with alcohol and ethyl chloride. 1 cc of depomedrol 40mg /cc and 4 cc of lidocaine 1% was injected. there were no complications.  His updated medication list for this problem includes:        Hydrocodone-acetaminophen 5-325 Mg Tabs (Hydrocodone-acetaminophen) .Marland Kitchen... 1 q 6 hrs prnpain  Orders: New Patient Level IV (19147) Knee x-ray,  3 views (82956) Joint Aspirate / Injection, Large (20610) Depo- Medrol 40mg  (J1030)  Medications Added to Medication List This Visit: 1)  Hydrocodone-acetaminophen 5-325 Mg Tabs (Hydrocodone-acetaminophen) .Marland Kitchen.. 1 q 6 hrs prnpain  Patient Instructions: 1)  You have received an injection of cortisone today. You may experience increased pain at the injection site. Apply ice pack to the area for 20 minutes every 2 hours and take 2 xtra strength tylenol every 8 hours. This increased pain will usually resolve in 24 hours. The injection will take effect in 3-10 days.  2)  take the hydrocodone as needed for severe pain  Prescriptions: HYDROCODONE-ACETAMINOPHEN 5-325 MG TABS (HYDROCODONE-ACETAMINOPHEN) 1 q 6 hrs prnpain  #60 x 5   Entered and Authorized by:   Fuller Canada MD   Signed by:   Fuller Canada MD on 11/26/2009   Method used:   Print then Give to Patient   RxID:   [id number]    Orders Added: 1)  New Patient Level IV [28413] 2)  Knee x-ray,  3 views [73562] 3)  Joint Aspirate / Injection,  Large [20610] 4)  Depo- Medrol 40mg  [J1030]

## 2010-02-04 NOTE — Progress Notes (Signed)
Summary: returning your call  Phone Note Call from Patient   Summary of Call: patient stopped by and states you called him yesterday please call him at [phone]. Initial call taken by: Lind Guest,  October 22, 2009 11:00 AM  Follow-up for Phone Call        spoke with him about referrals Follow-up by: Rudene Anda,  October 22, 2009 2:12 PM

## 2010-02-04 NOTE — Progress Notes (Signed)
  Phone Note Outgoing Call   Summary of Call: Advise pt that we received  a notice from his pharmacy that the prescription allergy medication is not covered by his ins.  He will need to continue using over the counter allergy meds.   Initial call taken by: Esperanza Sheets PA,  September 23, 2009 2:51 PM  Follow-up for Phone Call        called patient, left message Follow-up by: Adella Hare LPN,  September 23, 2009 3:22 PM  Additional Follow-up for Phone Call Additional follow up Details #1::        patient aware Additional Follow-up by: Adella Hare LPN,  September 23, 2009 4:14 PM

## 2010-02-04 NOTE — Progress Notes (Signed)
Summary: Initial evaluation  Initial evaluation   Imported By: Jacklynn Ganong 11/25/2009 10:47:17  _____________________________________________________________________  External Attachment:    Type:   Image     Comment:   External Document

## 2010-02-04 NOTE — Progress Notes (Signed)
Summary: Progress note  Progress note   Imported By: Jacklynn Ganong 11/25/2009 10:48:00  _____________________________________________________________________  External Attachment:    Type:   Image     Comment:   External Document

## 2010-02-04 NOTE — Letter (Signed)
Summary: Letter  Letter   Imported By: Lind Guest 10/30/2009 13:43:41  _____________________________________________________________________  External Attachment:    Type:   Image     Comment:   External Document

## 2010-02-04 NOTE — Assessment & Plan Note (Signed)
Summary: office visit   Vital Signs:  Patient profile:   60 year old male Height:      73 inches Weight:      210.75 pounds BMI:     27.91 O2 Sat:      95 % Pulse rate:   62 / minute Resp:     16 per minute BP sitting:   120 / 80  (left arm) Cuff size:   large  Vitals Entered By: Everitt Amber LPN (September 19, 2009 4:01 PM) CC: Follow up visit and CPE    Primary Provider:  Syliva Overman MD  CC:  Follow up visit and CPE .  History of Present Illness: Pt was originally scheduled for a physical, but he is 3mos early.  His physical is not due until December. Pt c/o sinus congestion x yrs.  Has been using over the counter sinus meds and states they dont work as well and requests a prescription.  His congestion is yr round though it varies.  No cough, difficulty breathing, sneezing or fever.  Pt reports 2 incidences of red blood after BM last week.  No pain, no constipation.  No hx of hemorrhoids.  Hx of colon polyps. Last colonoscopy 2/09 with Dr Lovell Sheehan.    Hx of bladder CA.  Last saw urologist approx 1 yr ago.    Current Medications (verified): 1)  Aspirin 81 Mg Tbec (Aspirin) .... Take 1 Tablet By Mouth Once A Day  Allergies (verified): 1)  ! Codeine  Past History:  Past medical history reviewed for relevance to current acute and chronic problems.  Past Medical History: Reviewed history from 04/13/2007 and no changes required. Current Problems:  BLADDER CANCER (ICD-188.9) NICOTINE ADDICTION (ICD-305.1)  Review of Systems General:  Denies chills and fever. ENT:  Complains of nasal congestion; denies earache, postnasal drainage, sinus pressure, and sore throat. CV:  Denies chest pain or discomfort and palpitations. Resp:  Denies cough and shortness of breath. GI:  Complains of bloody stools; denies abdominal pain, change in bowel habits, constipation, dark tarry stools, indigestion, nausea, and vomiting.  Physical Exam  General:   Well-developed,well-nourished,in no acute distress; alert,appropriate and cooperative throughout examination Head:  Normocephalic and atraumatic without obvious abnormalities. No apparent alopecia or balding. Ears:  External ear exam shows no significant lesions or deformities.  Otoscopic examination reveals clear canals, tympanic membranes are intact bilaterally without bulging, retraction, inflammation or discharge. Hearing is grossly normal bilaterally. Nose:  no external deformity.  nasal turbs mod swollen, and boggy Mouth:  Oral mucosa and oropharynx without lesions or exudates.  Teeth in good repair. Neck:  No deformities, masses, or tenderness noted. Lungs:  Normal respiratory effort, chest expands symmetrically. Lungs are clear to auscultation, no crackles or wheezes. Heart:  Normal rate and regular rhythm. S1 and S2 normal without gallop, murmur, click, rub or other extra sounds. Abdomen:  Bowel sounds positive,abdomen soft and non-tender without masses, organomegaly or hernias noted. Rectal:  No external abnormalities noted. Normal sphincter tone. No rectal masses or tenderness. Prostate:  Prostate gland firm and smooth, no enlargement, nodularity, tenderness, mass, asymmetry or induration. Cervical Nodes:  No lymphadenopathy noted Psych:  Cognition and judgment appear intact. Alert and cooperative with normal attention span and concentration. No apparent delusions, illusions, hallucinations   Impression & Recommendations:  Problem # 1:  ALLERGIC RHINITIS (ICD-477.9) Assessment Unchanged  His updated medication list for this problem includes:    Levocetirizine Dihydrochloride 5 Mg Tabs (Levocetirizine dihydrochloride) .Marland Kitchen... Take one daily  for nasal congestion and allergies    Azelastine Hcl 137 Mcg/spray Soln (Azelastine hcl) ..... Use 1spray each nostril twice daily  Problem # 2:  RECTAL BLEEDING (ICD-569.3) Assessment: Improved  Discussed with pt probable fissure or hemorrhoid.   Will get last colonoscopy results. Pt to call if has another incident of rectal bleeding.  Orders: Hemoccult Guaiac-1 spec.(in office) (82270)  Problem # 3:  BLADDER CANCER (ICD-188.9) Assessment: Comment Only  Pt due for f/u with urologist.  Orders: Urology Referral (Urology)  Problem # 4:  NICOTINE ADDICTION (ICD-305.1) Assessment: Comment Only  Encouraged smoking cessation   Complete Medication List: 1)  Aspirin 81 Mg Tbec (Aspirin) .... Take 1 tablet by mouth once a day 2)  Levocetirizine Dihydrochloride 5 Mg Tabs (Levocetirizine dihydrochloride) .... Take one daily for nasal congestion and allergies 3)  Azelastine Hcl 137 Mcg/spray Soln (Azelastine hcl) .... Use 1spray each nostril twice daily  Other Orders: Influenza Vaccine NON MCR (16109)  Patient Instructions: 1)  Schedule physical in December with Dr Lodema Hong. 2)  I have prescribed an allergy pill and nasal spray for congestion. 3)  Contact your urologist for follow up. 4)  Notifiy [place] if you have any more rectal bleeding. Prescriptions: AZELASTINE HCL 137 MCG/SPRAY SOLN (AZELASTINE HCL) use 1spray each nostril twice daily  #1 x 5   Entered and Authorized by:   Esperanza Sheets PA   Signed by:   Esperanza Sheets PA on 09/19/2009   Method used:   Electronically to        [facility] (retail)       [address]       [place], [place]  [address]       Ph: [phone]       Fax: [phone]   RxID:   [id number] LEVOCETIRIZINE DIHYDROCHLORIDE 5 MG TABS (LEVOCETIRIZINE DIHYDROCHLORIDE) take one daily for nasal congestion and allergies  #30 x 11   Entered and Authorized by:   Esperanza Sheets PA   Signed by:   Esperanza Sheets PA on 09/19/2009   Method used:   Electronically to        [facility] (retail)       [address]       [place], [place]  [address]       Ph: [phone]       Fax: [phone]   RxID:   [id number]    Influenza  Vaccine    Vaccine Type: Fluvax Non-MCR    Site: left deltoid    Mfr: novartis     Dose: 0.5 ml    Route: IM    Given by: Everitt Amber LPN    Exp. Date: 05/2010    Lot #: [id number]   Laboratory Results    Stool - Occult Blood Hemmoccult #1: negative Date: 09/19/2009

## 2010-02-04 NOTE — Progress Notes (Signed)
Summary: less expensive medication  Phone Note Call from Patient   Summary of Call: Patient states you gave him a medication for his knee he states he had a coupon that was suppose to be free, but the pharmacy said since he had insurance he would have to pay his $70 copay for the medication.  He wanted to know if you could send him another prescription that might not be so expensive. [phone] Initial call taken by: Curtis Sites,  October 24, 2009 10:50 AM  Follow-up for Phone Call        ibuprofen sent in and pt aware Follow-up by: Syliva Overman MD,  October 24, 2009 5:07 PM    New/Updated Medications: IBUPROFEN 800 MG TABS (IBUPROFEN) Take 1 tablet by mouth three times a day for 7 days, then 1 to tabs daily  as needed for uncontrolled pain Prescriptions: IBUPROFEN 800 MG TABS (IBUPROFEN) Take 1 tablet by mouth three times a day for 7 days, then 1 to tabs daily  as needed for uncontrolled pain  #50 x 0   Entered and Authorized by:   Syliva Overman MD   Signed by:   Syliva Overman MD on 10/24/2009   Method used:   Electronically to        [facility] (retail)       [address]       [place], [place]  [address]       Ph: [phone]       Fax: [phone]   RxID:   [id number]

## 2010-02-04 NOTE — Procedures (Signed)
Summary: COLONOSCOPY  COLONOSCOPY   Imported By: Lind Guest 09/20/2009 14:48:44  _____________________________________________________________________  External Attachment:    Type:   Image     Comment:   External Document

## 2010-02-04 NOTE — Progress Notes (Signed)
  Phone Note From Pharmacy   Caller: [facility] Summary of Call: levocetirizine 5mg  not covered alternative? Initial call taken by: Adella Hare LPN,  October 15, 2009 8:59 AM  Follow-up for Phone Call        See 09-23-09 phone note.  Pt was advised to use over the counter. Follow-up by: Esperanza Sheets PA,  October 15, 2009 1:17 PM  Additional Follow-up for Phone Call Additional follow up Details #1::        noted Additional Follow-up by: Adella Hare LPN,  October 15, 2009 2:50 PM

## 2010-02-04 NOTE — Letter (Signed)
Summary: history form  history form   Imported By: Jacklynn Ganong 11/27/2009 11:21:16  _____________________________________________________________________  External Attachment:    Type:   Image     Comment:   External Document

## 2010-02-06 ENCOUNTER — Encounter: Payer: Self-pay | Admitting: Orthopedic Surgery

## 2010-02-06 ENCOUNTER — Telehealth: Payer: Self-pay | Admitting: Orthopedic Surgery

## 2010-02-06 NOTE — Progress Notes (Signed)
Summary: prescription  Phone Note Call from Patient   Summary of Call: patient called in and states he needs the synvise 8 mg/ml injection please send to [facility] [phone], and fax [phone].  He said that you can call them to okay the medication they can then send him the prescription.  Please call him at [phone] Initial call taken by: Curtis Sites,  January 29, 2010 9:24 AM  Follow-up for Phone Call        I faxed [facility] the rx for synvisc Follow-up by: Ether Griffins,  January 29, 2010 9:46 AM

## 2010-02-06 NOTE — Progress Notes (Signed)
Summary: Will Synvisc injection help him?  Phone Note Call from Patient   Summary of Call: Matthew Adkins (10/23/1950) asked if you think the Synvisc injections would help his knee pain. Says he cannot have a knee replacement right now. If you think the injections wil help him I will schedule an appointment for him to come in. His # [phone] Initial call taken by: Jacklynn Ganong,  January 22, 2010 4:30 PM  Follow-up for Phone Call        we need to get it pre approved !  Follow-up by: Fuller Canada MD,  January 22, 2010 4:37 PM  Additional Follow-up for Phone Call Additional follow up Details #1::        Patient advised Additional Follow-up by: Jacklynn Ganong,  January 22, 2010 5:09 PM

## 2010-02-06 NOTE — Assessment & Plan Note (Signed)
Summary: office visit   Vital Signs:  Patient profile:   60 year old male Height:      75 inches Weight:      209.75 pounds BMI:     26.31 O2 Sat:      98 % on Room air Pulse rate:   64 / minute Pulse rhythm:   regular Resp:     16 per minute BP sitting:   130 / 82  (left arm)  Vitals Entered By: Adella Hare LPN (December 20, 2009 8:01 AM)  Nutrition Counseling: Patient's BMI is greater than 25 and therefore counseled on weight management options.  O2 Flow:  Room air CC: follow-up visit Is Patient Diabetic? No   Primary Care Provider:  Syliva Overman MD  CC:  follow-up visit.  History of Present Illness: Pt had a recent urology visit, painless hematuria note , and  he is sched for cystoscopy in Jan due to diffuse bladder wall thickeneing.his stated concern is whether he should see another urologist for a 2nd opinion on management of this problem, I advised that i saw no reason for him to do this, states hiswife wanted to know, and he agrees. Reports  that he has been  doing well. Denies recent fever or chills. Denies sinus pressure, nasal congestion , ear pain or sore throat. Denies chest congestion, or cough productive of sputum. Denies chest pain, palpitations, PND, orthopnea or leg swelling. Denies abdominal pain, nausea, vomitting, diarrhea or constipation. Denies change in bowel movements or bloody stool. Denies dysuria , frequency, incontinence or hesitancy. Occasional  joint pain, swelling, and  reduced mobility. Denies headaches, vertigo, seizures. Denies depression, anxiety or insomnia. Denies  rash, lesions, or itch.     Preventive Screening-Counseling & Management  Alcohol-Tobacco     Smoking Cessation Counseling: yes  Current Medications (verified): 1)  Aspirin 81 Mg Tbec (Aspirin) .... Take 1 Tablet By Mouth Once A Day 2)  Centrum .Marland Kitchen.. 1 Tab Once A Day  Allergies (verified): 1)  ! Codeine  Review of Systems      See HPI Eyes:  Denies blurring  and discharge. MS:  Complains of joint pain and stiffness. Psych:  Denies anxiety and depression. Endo:  Denies cold intolerance, excessive hunger, excessive thirst, and excessive urination. Heme:  Denies abnormal bruising and bleeding. Allergy:  Denies hives or rash and itching eyes.  Physical Exam  General:  Well-developed,well-nourished,in no acute distress; alert,appropriate and cooperative throughout examination HEENT: No facial asymmetry,  EOMI, No sinus tenderness, TM's Clear, oropharynx  pink and moist.   Chest: Clear to auscultation bilaterally.  CVS: S1, S2, No murmurs, No S3.   Abd: Soft, Nontender.  MS: Adequate ROM spine, hips, shoulders and knees.  Ext: No edema.   CNS: CN 2-12 intact, power tone and sensation normal throughout.   Skin: Intact, no visible lesions or rashes.  Psych: Good eye contact, normal affect.  Memory intact, not anxious or depressed appearing.    Impression & Recommendations:  Problem # 1:  DEGENERATIVE JOINT DISEASE, RIGHT KNEE (ICD-715.96) Assessment Unchanged  The following medications were removed from the medication list:    Ibuprofen 800 Mg Tabs (Ibuprofen) .Marland Kitchen... Take 1 tablet by mouth three times a day for 7 days, then 1 to tabs daily  as needed for uncontrolled pain    Hydrocodone-acetaminophen 5-325 Mg Tabs (Hydrocodone-acetaminophen) .Marland Kitchen... 1 q 6 hrs prnpain His updated medication list for this problem includes:    Aspirin 81 Mg Tbec (Aspirin) .Marland Kitchen... Take 1  tablet by mouth once a day  Problem # 2:  DYSLIPIDEMIA (ICD-272.4) Assessment: Deteriorated    HDL:48 (10/22/2009), 42 (08/31/2008)  LDL:129 (10/22/2009), 109 (08/31/2008)  Chol:188 (10/22/2009), 163 (08/31/2008)  Trig:55 (10/22/2009), 59 (08/31/2008) Low fat dietdiscussed and encouraged  Problem # 3:  NICOTINE ADDICTION (ICD-305.1) Assessment: Improved  Encouraged smoking cessation and discussed different methods for smoking cessation.  smokeson avg 2/day now  Problem # 4:   BLADDER CANCER (ICD-188.9) Assessment: Comment Only new hematuria with diffuse bladder wall thickeneing,for cystoscopy in the new yr  Complete Medication List: 1)  Aspirin 81 Mg Tbec (Aspirin) .... Take 1 tablet by mouth once a day 2)  Centrum  .Marland Kitchen.. 1 tab once a day  Patient Instructions: 1)  Follow up appointment in 5.35months 2)  Tobacco is very bad for your health and your loved ones! You Should stop smoking!. 3)  Stop Smoking Tips: Choose a Quit date. Cut down before the Quit date. decide what you will do as a substitute when you feel the urge to smoke(gum,toothpick,exercise). 4)  It is important that you exercise regularly at least 20 minutes 5 times a week. If you develop chest pain, have severe difficulty breathing, or feel very tired , stop exercising immediately and seek medical attention. 5)  You need to lose weight. Consider a lower calorie diet and regular exercise.  6)  pls cut back on use of oils , fatty foods, fried foods, red meat   Orders Added: 1)  Est. Patient Level IV [54098]

## 2010-02-06 NOTE — Medication Information (Signed)
Summary: RX Folder brace  RX Folder brace   Imported By: Cammie Sickle 01/03/2010 10:27:05  _____________________________________________________________________  External Attachment:    Type:   Image     Comment:   External Document

## 2010-02-06 NOTE — Miscellaneous (Signed)
Summary: synvisc rx  Clinical Lists Changes  Medications: Added new medication of SYNVISC 8 MG/ML INJ (HYLAN) 1 BOX OF 3 - Signed Rx of SYNVISC 8 MG/ML INJ (HYLAN) 1 BOX OF 3;  #1 x 0;  Signed;  Entered by: Fuller Canada MD;  Authorized by: Fuller Canada MD;  Method used: Print then Give to Patient    Prescriptions: SYNVISC 8 MG/ML INJ (HYLAN) 1 BOX OF 3  #1 x 0   Entered and Authorized by:   Fuller Canada MD   Signed by:   Fuller Canada MD on 01/23/2010   Method used:   Print then Give to Patient   RxID:   [id number]

## 2010-02-06 NOTE — Progress Notes (Signed)
Summary: new medication  Phone Note Call from Patient   Summary of Call: Patient called in states that he is starting on new medication on monday for bladder problem need to talk with someone about it please called patient back at [phone] cell # Initial call taken by: Lind Guest,  January 29, 2010 1:42 PM  Follow-up for Phone Call        Dr Earney Mallet gave him a med for his bladder and I tried to get more information from him but he said he had asked for the doctor to call him, not the nurse. I told him I was just trying to get more information for you and he said he already gave that to the receptionist. I told him that it would be after you had time and all the patients were gone. He said that was fine  Follow-up by: Everitt Amber LPN,  January 30, 2010 10:01 AM    spoke with pt, he intends to start treatments this monday as per the recommendation of his treating urologist. His wife on the other hand is wanting him to get a 2nd opinion from an oncologist to answer quest about treatment options, and to understand if tests have been done to determine if tnhe cancer is spread. izI encouraged her to spk wih Dr Rito Ehrlich but she was not interested  She is to call back later today with the names of oncologistsshe wants to consult with.Her husband is aware of the difference in opinions.

## 2010-02-06 NOTE — Letter (Signed)
Summary: 1st missed no show  1st missed no show   Imported By: Lind Guest 01/22/2010 10:24:28  _____________________________________________________________________  External Attachment:    Type:   Image     Comment:   External Document

## 2010-02-12 NOTE — Medication Information (Signed)
Summary: Synvisc clarification request faxed to [facility]  Synvisc clarification request faxed to [facility]   Imported By: Jacklynn Ganong 02/07/2010 08:53:02  _____________________________________________________________________  External Attachment:    Type:   Image     Comment:   External Document

## 2010-02-12 NOTE — Progress Notes (Signed)
Summary: [facility] mail in pharmacy information request  Phone Note From Pharmacy   Caller: [facility] mail in pharmacy Summary of Call: Domingo Dimes from [employer] [phone], opt 2, called re: Synvisc Rx.  She fwd'd the call to [facility], Pharmacologist. Verifying how often Rx for Synvisc and for which knee? Please call PH [phone], Ref # [id number]. Initial call taken by: Cammie Sickle,  February 06, 2010 10:58 AM  Follow-up for Phone Call        right knee and injection once per week for 3 weeks Follow-up by: Ether Griffins,  February 06, 2010 11:30 AM  Additional Follow-up for Phone Call Additional follow up Details #1::        Phone call completed per [facility], pharmacy tech [place]. Additional Follow-up by: Cammie Sickle,  February 07, 2010 8:50 AM

## 2010-02-20 ENCOUNTER — Ambulatory Visit: Payer: BC Managed Care – PPO | Admitting: Orthopedic Surgery

## 2010-02-20 NOTE — Letter (Signed)
Summary: [facility]   Imported By: Lind Guest 02/10/2010 13:55:51  _____________________________________________________________________  External Attachment:    Type:   Image     Comment:   External Document

## 2010-02-20 NOTE — Letter (Signed)
Summary: [facility]   Imported By: Lind Guest 02/14/2010 10:47:46  _____________________________________________________________________  External Attachment:    Type:   Image     Comment:   External Document

## 2010-02-25 ENCOUNTER — Encounter: Payer: Self-pay | Admitting: Family Medicine

## 2010-02-25 ENCOUNTER — Ambulatory Visit (HOSPITAL_COMMUNITY): Payer: BC Managed Care – PPO | Admitting: Oncology

## 2010-02-25 DIAGNOSIS — C679 Malignant neoplasm of bladder, unspecified: Secondary | ICD-10-CM

## 2010-03-03 ENCOUNTER — Ambulatory Visit (INDEPENDENT_AMBULATORY_CARE_PROVIDER_SITE_OTHER): Payer: BC Managed Care – PPO | Admitting: Orthopedic Surgery

## 2010-03-03 ENCOUNTER — Encounter: Payer: Self-pay | Admitting: Orthopedic Surgery

## 2010-03-03 DIAGNOSIS — M1710 Unilateral primary osteoarthritis, unspecified knee: Secondary | ICD-10-CM

## 2010-03-12 ENCOUNTER — Encounter: Payer: Self-pay | Admitting: Family Medicine

## 2010-03-12 ENCOUNTER — Encounter: Payer: Self-pay | Admitting: Orthopedic Surgery

## 2010-03-12 ENCOUNTER — Ambulatory Visit (INDEPENDENT_AMBULATORY_CARE_PROVIDER_SITE_OTHER): Payer: BC Managed Care – PPO | Admitting: Orthopedic Surgery

## 2010-03-12 DIAGNOSIS — M1710 Unilateral primary osteoarthritis, unspecified knee: Secondary | ICD-10-CM

## 2010-03-13 NOTE — Assessment & Plan Note (Signed)
Summary: Synvisc injection #1 RT knee/OK for Mon appt/Dr H/Caf   Visit Type:  Follow-up Referring Provider:  self Primary Provider:  Syliva Overman MD  CC:  right knee pain.  History of Present Illness: I saw Matthew Adkins in the office today for a followup visit.  He is a 60 years old man with the complaint of:  right knee.  Medications: Hydrocodone-acetaminophen 5-325 Mg Tabs (Hydrocodone-acetaminophen) .Marland Kitchen... 1 q 6 hrs prn pain  Last seen on 11-26-09.  Today, 1st Synvisc injection  The patient brought in his own. His Synvisc injection, and we injected one vial into the RIGHT knee under sterile conditions  Verbal consent was obtained. The knee was prepped with alcohol and ethyl chloride.  1 vial of synvisc was injected into the knee.    Allergies: 1)  ! Codeine   Impression & Recommendations:  Problem # 1:  DEGENERATIVE JOINT DISEASE, RIGHT KNEE (ICD-715.96) Assessment Unchanged  His updated medication list for this problem includes:    Aspirin 81 Mg Tbec (Aspirin) .Marland Kitchen... Take 1 tablet by mouth once a day    Synvisc 8 Mg/ml Inj (Hylan) .Marland Kitchen... 1 box of 3  Orders: Synvisc (40981X)  Patient Instructions: 1)  Please schedule a follow-up appointment in 1 week. 2)  come back for 2nd in jection of synvisic   Orders Added: 1)  Synvisc [20610M]

## 2010-03-17 ENCOUNTER — Encounter: Payer: Self-pay | Admitting: Orthopedic Surgery

## 2010-03-17 ENCOUNTER — Encounter: Payer: Self-pay | Admitting: Family Medicine

## 2010-03-18 NOTE — Letter (Signed)
Summary: [facility]   Imported By: Lind Guest 03/12/2010 16:32:03  _____________________________________________________________________  External Attachment:    Type:   Image     Comment:   External Document

## 2010-03-18 NOTE — Assessment & Plan Note (Signed)
Summary: SYNVISC INJECTION #2 RT KNEE/BCBS/CAF   Visit Type:  Follow-up Referring Provider:  self Primary Provider:  Syliva Overman MD  CC:  synvisc injection knee.  History of Present Illness: I saw Matthew Adkins in the office today for a followup visit.  He is a 60 years old man with the complaint of:  right knee.  Medications: Hydrocodone-acetaminophen 5-325 Mg Tabs (Hydrocodone-acetaminophen) .Marland Kitchen... 1 q 6 hrs prn pain   On Cipro for 7 days from PCP, Flomax for prostate.  Today, 2nd Synvisc injection  Feels somewhat more mobile after 1st injection.  Pain level of knee is about 2 today.  The patient is having pain and loss of function in the knee. Other conservative measures have failed (nsaids, rest, exercise, attempts at wt loss)    The patient brought in his own. His Synvisc injection, and we injected one vial into the RIGHT knee under sterile conditions  Verbal consent was obtained. The knee was prepped with alcohol and ethyl chloride.  1 vial of synvisc was injected into the knee.      Allergies: 1)  ! Codeine   Impression & Recommendations:  Problem # 1:  DEGENERATIVE JOINT DISEASE, RIGHT KNEE (ICD-715.96) Assessment Comment Only  His updated medication list for this problem includes:    Aspirin 81 Mg Tbec (Aspirin) .Marland Kitchen... Take 1 tablet by mouth once a day    Synvisc 8 Mg/ml Inj (Hylan) .Marland Kitchen... 1 box of 3  Orders: Synvisc (20610M)  Patient Instructions: 1)  f/u 1 week for 3 rd injection    Orders Added: 1)  Synvisc [20610M]

## 2010-03-19 ENCOUNTER — Telehealth: Payer: Self-pay | Admitting: Family Medicine

## 2010-03-20 ENCOUNTER — Ambulatory Visit (INDEPENDENT_AMBULATORY_CARE_PROVIDER_SITE_OTHER): Payer: BC Managed Care – PPO | Admitting: Orthopedic Surgery

## 2010-03-20 ENCOUNTER — Encounter: Payer: Self-pay | Admitting: Orthopedic Surgery

## 2010-03-20 DIAGNOSIS — M1710 Unilateral primary osteoarthritis, unspecified knee: Secondary | ICD-10-CM

## 2010-03-25 NOTE — Progress Notes (Signed)
  Phone Note Call from Patient   Summary of Call: Patient called in with the same symptoms as wife, fever, aching, congestion and wanted to be seen today. No appts available and advised urgent care. Did not want to go. Told him to try calling back in the morning to see if we had any cancellations is all I knew to tell them. Again, he wanted me to send the message to Dr anyway to see if anything could be sent in since they could not be seen Initial call taken by: Everitt Amber LPN,  March 19, 2010 8:23 AM  Follow-up for Phone Call        advise omly symptomatic meds are offered , and it may well be viraland this may be all he needs, but he will have to see how his symptoms progress. so tylenol otc for pain an fever 1 to2 every 6 to 8 hours, also fluids and rest, for ches congestion , robitussin, or mucinex.  Follow-up by: Syliva Overman MD,  March 19, 2010 8:37 AM  Additional Follow-up for Phone Call Additional follow up Details #1::        Patient decided to go to urgent care Additional Follow-up by: Everitt Amber LPN,  March 19, 2010 8:39 AM

## 2010-03-25 NOTE — Assessment & Plan Note (Signed)
Summary: SYNVISC INJEC #3 RT KNEE/BCBS/CAF   Visit Type:  Follow-up Referring Provider:  self Primary Provider:  Syliva Overman MD  CC:  right knee pain.  History of Present Illness: I saw Matthew Adkins in the office today for a followup visit.  He is a 60 years old man with the complaint of:  right knee  Medications: Hydrocodone-acetaminophen 5-325 Mg Tabs (Hydrocodone-acetaminophen) .Marland Kitchen... 1 q 6 hrs prn pain   On Cipro for 7 days from PCP, Flomax for prostate.  Today, 3rd Synvisc injection.  Patient states his knee is feeling better. He is also taking Zithromax for bronchitis.   The patient is having pain and loss of function in the knee. Other conservative measures have failed (nsaids, rest, exercise, attempts at wt loss)   Verbal consent was obtained. The RIGHT knee was prepped with alcohol and ethyl chloride.    1 vial of synvisc was injected into the right knee.    Allergies: 1)  ! Codeine  Physical Exam  Nose:  clear nasal discharge.   Additional Exam:  no changes in the knee exam    Other Orders: Synvisc (20610M)  Patient Instructions: 1)  xrays right knee in 3 months    Orders Added: 1)  Synvisc [20610M]

## 2010-03-25 NOTE — Letter (Signed)
Summary: [facility]   Imported By: Lind Guest 03/21/2010 13:24:17  _____________________________________________________________________  External Attachment:    Type:   Image     Comment:   External Document

## 2010-04-03 NOTE — Letter (Signed)
Summary: [facility]  [facility]   Imported By: Lind Guest 03/25/2010 15:28:33  _____________________________________________________________________  External Attachment:    Type:   Image     Comment:   External Document

## 2010-05-13 ENCOUNTER — Ambulatory Visit: Payer: BC Managed Care – PPO | Admitting: Orthopedic Surgery

## 2010-05-19 ENCOUNTER — Telehealth: Payer: Self-pay | Admitting: Family Medicine

## 2010-05-19 NOTE — Telephone Encounter (Signed)
Noted and agree. 

## 2010-05-19 NOTE — Telephone Encounter (Signed)
Noted  

## 2010-05-20 NOTE — H&P (Signed)
Matthew Adkins, [name]              ACCOUNT NO.:  [id number]   MEDICAL RECORD NO.:  [id number]          PATIENT TYPE:  AMB   LOCATION:  DAY                           FACILITY:  [facility]   PHYSICIAN:  Dalia Heading, M.D.  DATE OF BIRTH:  22-Apr-1950   DATE OF ADMISSION:  DATE OF DISCHARGE:  LH                              HISTORY & PHYSICAL   PREADMISSION HISTORY AND PHYSICAL   CHIEF COMPLAINT:  History of colon polyps.   HISTORY OF PRESENT ILLNESS:  The patient is a 60 year old, black male,  who is referred for endoscopic evaluation.  He needs a colonoscopy for a  followup of colon polyps.  He last had a colonoscopy over five years  ago.  No abdominal pain, weight loss, nausea, vomiting, diarrhea,  constipation, melena, or hematochezia have been noted.  There is no  family history of colon carcinoma.   PAST MEDICAL HISTORY:  Unremarkable.   PAST SURGICAL HISTORY:  Bladder and foot surgeries.   CURRENT MEDICATIONS:  None.   ALLERGIES:  No known drug allergies.   REVIEW OF SYSTEMS:  The patient smokes a half a pack of cigarettes a  day.  Denies any significant alcohol use.  He denies any other  cardiopulmonary difficulties or bleeding disorders.   PHYSICAL EXAMINATION:  GENERAL:  The patient is a well-developed and  well-nourished, black male in no acute distress.  LUNGS:  Clear to auscultation with equal breath sounds bilaterally.  HEART:  Regular rate and rhythm without S3, S4, or murmurs.  ABDOMEN:  Soft, nontender, and nondistended.  No hepatosplenomegaly or  masses are noted.  RECTAL EXAMINATION:  Deferred to the procedure.   IMPRESSION:  History of colon polyps.   PLAN:  The patient is scheduled for a colonoscopy on February 22, 2007.  The risks and benefits of the procedure including bleeding and  perforation were fully explained to the patient, who gave informed  consent.      Dalia Heading, M.D.  Electronically Signed     MAJ/MEDQ  D:  02/15/2007  T:   02/16/2007  Job:  [id number]   cc:   Matthew Adkins, M.D.  Fax: [phone]

## 2010-05-23 NOTE — H&P (Signed)
Matthew Adkins, Matthew Adkins              ACCOUNT NO.:  [id number]   MEDICAL RECORD NO.:  [id number]          PATIENT TYPE:  AMB   LOCATION:  DAY                           FACILITY:  [facility]   PHYSICIAN:  Jerolyn Shin C. Katrinka Blazing, M.D.   DATE OF BIRTH:  1950-05-13   DATE OF ADMISSION:  DATE OF DISCHARGE:  LH                                HISTORY & PHYSICAL   HISTORY OF PRESENT ILLNESS:  A 60 year old male with need for screening  colonoscopy.  The patient has no GI symptoms.  He has guaiac-negative stool.  No family history of colon cancer.  He is scheduled for his initial  screening colonoscopy.   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   MEDICATIONS:  None.   SURGERY:  Bilateral foot surgery, recurrent bladder surgery for transitional  cell cancer, right knee surgery, left inguinal hernia repair.   ALLERGIES:  None.   SOCIAL HISTORY:  He is married.  He is employed.  He smokes one pack of  cigarettes per day.  He does not drink.  No drug use.   REVIEW OF SYSTEMS:  Negative.   PHYSICAL EXAMINATION:  VITAL SIGNS:  Blood pressure 112/74, pulse 78,  respirations 20, weight 280 pounds.  HEENT:  Unremarkable.  NECK:  Supple.  No JVD, bruit, adenopathy, or thyromegaly.  CHEST:  Clear to auscultation.  HEART:  Regular rate and rhythm without murmur, gallop, or rub.  ABDOMEN:  Soft, nontender, no masses.  RECTAL:  Normal.  Stool guaiac negative.  EXTREMITIES:  No cyanosis, clubbing, or edema.  NEUROLOGIC:  No focal motor, sensory, or cerebellar deficits.   IMPRESSION:  1.  Need for screening colonoscopy.  2.  History of transitional cell carcinoma of the bladder status post      resection.  3.  Osteoarthritis.   PLAN:  Total colonoscopy.      Dirk Dress. Katrinka Blazing, M.D.  Electronically Signed     LCS/MEDQ  D:  03/05/2005  T:  03/05/2005  Job:  [id number]   cc:   [facility]  Fax:  [phone]

## 2010-05-27 ENCOUNTER — Encounter: Payer: Self-pay | Admitting: Orthopedic Surgery

## 2010-05-27 ENCOUNTER — Ambulatory Visit (INDEPENDENT_AMBULATORY_CARE_PROVIDER_SITE_OTHER): Payer: BC Managed Care – PPO | Admitting: Orthopedic Surgery

## 2010-05-27 DIAGNOSIS — M5430 Sciatica, unspecified side: Secondary | ICD-10-CM

## 2010-05-27 DIAGNOSIS — M5431 Sciatica, right side: Secondary | ICD-10-CM

## 2010-05-27 MED ORDER — PREDNISONE (PAK) 10 MG PO TABS: ORAL_TABLET | ORAL | Status: DC

## 2010-05-27 MED ORDER — GABAPENTIN 100 MG PO CAPS: 100.0000 mg | ORAL_CAPSULE | Freq: Three times a day (TID) | ORAL | Status: DC

## 2010-05-27 MED ORDER — HYDROCODONE-ACETAMINOPHEN 5-325 MG PO TABS: 1.0000 | ORAL_TABLET | Freq: Four times a day (QID) | ORAL | Status: DC | PRN

## 2010-05-27 NOTE — Patient Instructions (Signed)
Limit lifting   Use heat on back

## 2010-05-27 NOTE — Progress Notes (Signed)
History as noted  A 60 year old male with osteoarthritis RIGHT knee presents with recurrent RIGHT lower extremity radicular pain.  He had a similar episode over 10 years ago.  He went to the beach last week and started to feel some twinges in his back and RIGHT side which worsened starting yesterday associated with radicular pain in the RIGHT lower extremity and back pain  Review of systems no recent excessive weight loss, bowel or bladder dysfunction.  History of bladder cancer  Symptoms present for 2 days  Exam shows a well-developed well-nourished male plumbing and hygiene are normal.  He has normal pulse in his RIGHT foot skin is intact he has tenderness in his lower back positive Lasegue's sign at 60 normal sensation the RIGHT foot.  Muscle tone is excellent.  Hip range of motion is normal.  Impression sciatica  Recommend medication rest heat  If no improvement x-ray back next visit

## 2010-06-17 ENCOUNTER — Ambulatory Visit: Payer: BC Managed Care – PPO | Admitting: Orthopedic Surgery

## 2010-06-17 ENCOUNTER — Encounter: Payer: Self-pay | Admitting: Orthopedic Surgery

## 2010-06-24 ENCOUNTER — Encounter: Payer: Self-pay | Admitting: Family Medicine

## 2010-06-25 ENCOUNTER — Ambulatory Visit (INDEPENDENT_AMBULATORY_CARE_PROVIDER_SITE_OTHER): Payer: BC Managed Care – PPO | Admitting: Family Medicine

## 2010-06-25 ENCOUNTER — Ambulatory Visit: Payer: BC Managed Care – PPO | Admitting: Family Medicine

## 2010-06-25 ENCOUNTER — Encounter: Payer: Self-pay | Admitting: Family Medicine

## 2010-06-25 ENCOUNTER — Ambulatory Visit: Payer: BC Managed Care – PPO | Admitting: Orthopedic Surgery

## 2010-06-25 VITALS — BP 102/80 | HR 82 | Resp 16 | Ht 75.0 in | Wt 205.0 lb

## 2010-06-25 DIAGNOSIS — E785 Hyperlipidemia, unspecified: Secondary | ICD-10-CM

## 2010-06-25 DIAGNOSIS — Z23 Encounter for immunization: Secondary | ICD-10-CM

## 2010-06-25 DIAGNOSIS — F17200 Nicotine dependence, unspecified, uncomplicated: Secondary | ICD-10-CM

## 2010-06-25 DIAGNOSIS — Z2911 Encounter for prophylactic immunotherapy for respiratory syncytial virus (RSV): Secondary | ICD-10-CM

## 2010-06-25 DIAGNOSIS — E739 Lactose intolerance, unspecified: Secondary | ICD-10-CM

## 2010-06-25 DIAGNOSIS — J309 Allergic rhinitis, unspecified: Secondary | ICD-10-CM

## 2010-06-25 DIAGNOSIS — F329 Major depressive disorder, single episode, unspecified: Secondary | ICD-10-CM

## 2010-06-25 DIAGNOSIS — F320 Major depressive disorder, single episode, mild: Secondary | ICD-10-CM | POA: Insufficient documentation

## 2010-06-25 DIAGNOSIS — Z13820 Encounter for screening for osteoporosis: Secondary | ICD-10-CM

## 2010-06-25 DIAGNOSIS — R5381 Other malaise: Secondary | ICD-10-CM

## 2010-06-25 DIAGNOSIS — C679 Malignant neoplasm of bladder, unspecified: Secondary | ICD-10-CM

## 2010-06-25 LAB — CBC WITH DIFFERENTIAL/PLATELET
Basophils Absolute: 0 10*3/uL (ref 0.0–0.1)
HCT: 45.9 % (ref 39.0–52.0)
Lymphocytes Relative: 32 % (ref 12–46)
Monocytes Absolute: 0.4 10*3/uL (ref 0.1–1.0)
Neutro Abs: 2.7 10*3/uL (ref 1.7–7.7)
Neutrophils Relative %: 56 % (ref 43–77)
Platelets: 222 10*3/uL (ref 150–400)
RDW: 13.7 % (ref 11.5–15.5)
WBC: 4.8 10*3/uL (ref 4.0–10.5)

## 2010-06-25 LAB — BASIC METABOLIC PANEL
Chloride: 106 mEq/L (ref 96–112)
Potassium: 4.4 mEq/L (ref 3.5–5.3)
Sodium: 140 mEq/L (ref 135–145)

## 2010-06-25 LAB — HEMOGLOBIN A1C
Hgb A1c MFr Bld: 6.2 % — ABNORMAL HIGH (ref <5.7)
Mean Plasma Glucose: 131 mg/dL — ABNORMAL HIGH (ref <117)

## 2010-06-25 MED ORDER — FLUOXETINE HCL 10 MG PO CAPS: 10.0000 mg | ORAL_CAPSULE | Freq: Every day | ORAL | Status: DC

## 2010-06-25 NOTE — Assessment & Plan Note (Signed)
Improving, still trying to quit

## 2010-06-25 NOTE — Patient Instructions (Signed)
F/u in 2 .5 months  CBC  , chem7, tsh, vit d and hba1c   Today, fatigue, dehydration  New med to be started for anxiety and mild depression

## 2010-06-25 NOTE — Assessment & Plan Note (Signed)
Recently treated earluier this year for recurrence, had difficulty with BCG , however treatment completed

## 2010-06-25 NOTE — Progress Notes (Signed)
  Subjective:    Patient ID: Matthew Adkins, male    DOB: 05/18/1950, 60 y.o.   MRN: [id number]  HPI 2 week h/o increasee d sluggishness and fatigue, anxious at times and depressed, reports some social withdrawal and anhidonia, not suicidal nor homicidal, overwhelmed with health challenges in his wife and in himself. Succesfuly treated for recurrent bladder ca earlier this year, and will continue to follow with urology   Review of Systems Denies recent fever or chills. Denies sinus pressure, nasal congestion, ear pain or sore throat. Denies chest congestion, productive cough or wheezing. Denies chest pains, palpitations, paroxysmal nocturnal dyspnea, orthopnea and leg swelling Denies abdominal pain, nausea, vomiting,diarrhea or constipation.  Denies rectal bleeding or change in bowel movement. Denies dysuria, frequency, hesitancy or incontinence. Denies joint pain, swelling and limitation in mobility. Denies headaches, seizure, numbness, or tingling. Denies skin break down or rash.        Objective:   Physical Exam Patient alert and oriented and in no Cardiopulmonary distress.  HEENT: No facial asymmetry, EOMI, no sinus tenderness, TM's clear, Oropharynx pink and moist.  Neck supple no adenopathy.  Chest: Clear to auscultation bilaterally.  CVS: S1, S2 no murmurs, no S3.  ABD: Soft non tender. Bowel sounds normal.  Ext: No edema  MS: Adequate ROM spine, shoulders, hips and knees.  Skin: Intact, no ulcerations or rash noted.  Psych: Good eye contact, flat affect. Memory intact depressed appearing.Mildly tearful at times  CNS: CN 2-12 intact, power, tone and sensation normal throughout.        Assessment & Plan:

## 2010-06-25 NOTE — Assessment & Plan Note (Signed)
Deteriorated, sample of n/s nasal spray given to pt

## 2010-06-25 NOTE — Assessment & Plan Note (Signed)
Pt to start med today, this is a new diagnosis, he is not suicidal nor homicidal and is not hallucinating

## 2010-06-26 ENCOUNTER — Telehealth: Payer: Self-pay | Admitting: Family Medicine

## 2010-06-26 NOTE — Telephone Encounter (Signed)
Med was refilled yesterday

## 2010-06-27 ENCOUNTER — Telehealth: Payer: Self-pay | Admitting: Family Medicine

## 2010-06-27 DIAGNOSIS — F329 Major depressive disorder, single episode, unspecified: Secondary | ICD-10-CM

## 2010-06-27 LAB — VITAMIN D 1,25 DIHYDROXY
Vitamin D 1, 25 (OH)2 Total: 70 pg/mL (ref 18–72)
Vitamin D2 1, 25 (OH)2: <8 pg/mL
Vitamin D3 1, 25 (OH)2: 70 pg/mL

## 2010-06-27 MED ORDER — FLUOXETINE HCL 10 MG PO CAPS: 10.0000 mg | ORAL_CAPSULE | Freq: Every day | ORAL | Status: DC

## 2010-06-27 NOTE — Telephone Encounter (Signed)
Med resent, patient states pharmacy doesn't have med

## 2010-09-02 ENCOUNTER — Telehealth: Payer: Self-pay | Admitting: Family Medicine

## 2010-09-02 NOTE — Telephone Encounter (Signed)
pls give a work in appt tomorroow

## 2010-09-03 ENCOUNTER — Ambulatory Visit (INDEPENDENT_AMBULATORY_CARE_PROVIDER_SITE_OTHER): Payer: BC Managed Care – PPO | Admitting: Family Medicine

## 2010-09-03 ENCOUNTER — Encounter: Payer: Self-pay | Admitting: Family Medicine

## 2010-09-03 VITALS — BP 124/82 | HR 61 | Resp 14 | Ht 75.0 in | Wt 208.0 lb

## 2010-09-03 DIAGNOSIS — K625 Hemorrhage of anus and rectum: Secondary | ICD-10-CM

## 2010-09-03 DIAGNOSIS — R7309 Other abnormal glucose: Secondary | ICD-10-CM

## 2010-09-03 DIAGNOSIS — F17200 Nicotine dependence, unspecified, uncomplicated: Secondary | ICD-10-CM

## 2010-09-03 DIAGNOSIS — E739 Lactose intolerance, unspecified: Secondary | ICD-10-CM

## 2010-09-03 DIAGNOSIS — E785 Hyperlipidemia, unspecified: Secondary | ICD-10-CM

## 2010-09-03 DIAGNOSIS — C679 Malignant neoplasm of bladder, unspecified: Secondary | ICD-10-CM

## 2010-09-03 DIAGNOSIS — R7303 Prediabetes: Secondary | ICD-10-CM

## 2010-09-03 DIAGNOSIS — M25569 Pain in unspecified knee: Secondary | ICD-10-CM

## 2010-09-03 DIAGNOSIS — R1013 Epigastric pain: Secondary | ICD-10-CM

## 2010-09-03 NOTE — Assessment & Plan Note (Signed)
Low fat diet discussed and encouraged, rept labs in 2 to 3 months

## 2010-09-03 NOTE — Progress Notes (Signed)
  Subjective:    Patient ID: Matthew Adkins, male    DOB: November 15, 1950, 60 y.o.   MRN: [id number]  HPI Pt presents for the 2nd time this year with spontaneous bright red rectal bleeding, no straining or pain. Last colonoscopy in 2009 normal except fopr a polyp, no pathology on that, no known fam h/o colon ca, personal h/o bladder cancer. Denies change in bowel movements. Feels well otherwise C/o intermittent reflux symptoms. Has prilosec at home and will use this  Review of Systems Denies recent fever or chills. Denies sinus pressure, nasal congestion, ear pain or sore throat. Denies chest congestion, productive cough or wheezing. Denies chest pains, palpitations and leg swelling Denies abdominal pain, nausea, vomiting,diarrhea or constipation.   Denies dysuria, frequency, hesitancy or incontinence. Denies joint pain, swelling and limitation in mobility. Denies headaches, seizures, numbness, or tingling. C/o some anxiety regarding his wife's health, feels he is handling it wellDenies skin break down or rash.        Objective:   Physical Exam Patient alert and oriented and in no cardiopulmonary distress.  HEENT: No facial asymmetry, EOMI, no sinus tenderness,  oropharynx pink and moist.  Neck supple no adenopathy.  Chest: Clear to auscultation bilaterally.  CVS: S1, S2 no murmurs, no S3.  ABD: Soft non tender. Bowel sounds normal. Rectal: not done, 2nd episode, no current bleeding, h/o polyp, needs GI eval Ext: No edema  MS: Adequate ROM spine, shoulders, hips and knees.  Skin: Intact, no ulcerations or rash noted.  Psych: Good eye contact, normal affect. Memory intact not anxious or depressed appearing.  CNS: CN 2-12 intact, power, tone and sensation normal throughout.        Assessment & Plan:

## 2010-09-03 NOTE — Patient Instructions (Addendum)
F/u end October.  Fasting lipid, and hBA1C 3 to 5 days before visit.   You are being referred to dr Jena Gauss about the rectal bleeding  H pylori today, pls start prilosec one daily fer reflux or indigestion.  pls stop smoking this will improve your health

## 2010-09-03 NOTE — Assessment & Plan Note (Signed)
Improved, states he smokes at home for stress relief , no quit date set

## 2010-09-03 NOTE — Assessment & Plan Note (Signed)
Persistent pain and has upcoming appt with ortho

## 2010-09-03 NOTE — Assessment & Plan Note (Signed)
Currently no evidence of disease per recent report, f/u in 2013 with urology

## 2010-09-03 NOTE — Assessment & Plan Note (Signed)
Dietary management to reduce risk of or prevent diabetes, rept lab in October

## 2010-09-03 NOTE — Assessment & Plan Note (Addendum)
Recurrent episode, painless, h/o polyp which was not biopsied in 2009, GI eval

## 2010-09-05 LAB — HELICOBACTER PYLORI  ANTIBODY, IGM: Helicobacter pylori, IgM: 7.4 U/mL (ref <9.0)

## 2010-09-10 MED ORDER — AMOXICILL-CLARITHRO-LANSOPRAZ PO MISC: Freq: Two times a day (BID) | ORAL | Status: AC

## 2010-09-10 NOTE — Progress Notes (Signed)
Addended by: Adella Hare B on: 09/10/2010 09:32 AM   Modules accepted: Orders

## 2010-09-11 ENCOUNTER — Ambulatory Visit (INDEPENDENT_AMBULATORY_CARE_PROVIDER_SITE_OTHER): Payer: BC Managed Care – PPO | Admitting: Family Medicine

## 2010-09-11 ENCOUNTER — Telehealth: Payer: Self-pay | Admitting: Family Medicine

## 2010-09-11 ENCOUNTER — Encounter: Payer: Self-pay | Admitting: Family Medicine

## 2010-09-11 VITALS — BP 110/70 | HR 67 | Resp 16 | Ht 75.0 in | Wt 208.0 lb

## 2010-09-11 DIAGNOSIS — R5383 Other fatigue: Secondary | ICD-10-CM

## 2010-09-11 DIAGNOSIS — R5381 Other malaise: Secondary | ICD-10-CM

## 2010-09-11 DIAGNOSIS — K625 Hemorrhage of anus and rectum: Secondary | ICD-10-CM

## 2010-09-11 DIAGNOSIS — R42 Dizziness and giddiness: Secondary | ICD-10-CM

## 2010-09-11 MED ORDER — FLUTICASONE PROPIONATE 50 MCG/ACT NA SUSP: 1.0000 | Freq: Two times a day (BID) | NASAL | Status: DC

## 2010-09-11 NOTE — Patient Instructions (Signed)
Get the blood work done Drink plenty of fluid Take it easy the weekend You can try the nasal spray If you still have symptoms on Monday please call If things get worse, you have chest pain, difficulty breathing or get worse- weakness on one side versus the other than go to the ER

## 2010-09-11 NOTE — Progress Notes (Signed)
  Subjective:    Patient ID: Matthew Adkins, male    DOB: 04-Sep-1950, 60 y.o.   MRN: [id number]  HPI  Dizziness and fatigue x 1 1/2 weeks, denies room spinning,  feels off, no syncope, no seizure, no falls, slight headaache, initially thought it was a sinus problem. Took an Careers adviser which helped after 1st dose but not the second. Worse with walking at times- he just doesn't feel himself. Newest medication for H pylori. Denies any recent rectal bleeding  Review of Systems  No fever, no chills, no CP, no SOB, + nausea, no emesis, no change in bowel, no sick contacts. +nasal congestion     Objective:   Physical Exam   GEN- NAD, alert and oriented x3 HEENT- PERRL, EOMI, non injected sclera, pink conjunctiva, MMM, oropharynx clear, small pupils- unable to visuzlize disc on fundoscopic exam Neck- Supple, no thryomegaly, shotty right nodes CVS- RRR, no murmur RESP-CTAB EXT- No edema Pulses- Radial, DP- 2+ Neuro- CNII-XII grossly in tact, normal finger to nose, normal rapid alternating motions, neg rhomberg, normal gait, normal mentation        Assessment & Plan:   Dizziness- unclear cause, he is not describing typical vertigo. Vitals stable.  Will treat nasal congestion with flonase, pt to rest. Will obtain CBC to check for anemia, lytes and TSH. Discussed meclizine but his episodes are very short and vary therefore will hold at this time. Given red flags  Fatigue- per above for dizziness

## 2010-09-12 LAB — TSH: TSH: 0.942 u[IU]/mL (ref 0.350–4.500)

## 2010-09-12 LAB — CBC
Hemoglobin: 14.2 g/dL (ref 13.0–17.0)
MCHC: 33.3 g/dL (ref 30.0–36.0)
RDW: 13.7 % (ref 11.5–15.5)
WBC: 5.6 10*3/uL (ref 4.0–10.5)

## 2010-09-12 LAB — BASIC METABOLIC PANEL
Chloride: 108 mEq/L (ref 96–112)
Glucose, Bld: 82 mg/dL (ref 70–99)
Potassium: 4.2 mEq/L (ref 3.5–5.3)
Sodium: 141 mEq/L (ref 135–145)

## 2010-09-12 NOTE — Telephone Encounter (Signed)
PATIENT WAS GIVEN OFFICE VISIT

## 2010-09-15 ENCOUNTER — Ambulatory Visit: Payer: BC Managed Care – PPO | Admitting: Internal Medicine

## 2010-09-16 ENCOUNTER — Ambulatory Visit: Payer: BC Managed Care – PPO | Admitting: Gastroenterology

## 2010-09-16 ENCOUNTER — Encounter: Payer: Self-pay | Admitting: Gastroenterology

## 2010-09-16 ENCOUNTER — Ambulatory Visit (INDEPENDENT_AMBULATORY_CARE_PROVIDER_SITE_OTHER): Payer: BC Managed Care – PPO | Admitting: Gastroenterology

## 2010-09-16 VITALS — BP 133/76 | HR 75 | Temp 97.7°F | Ht 75.0 in | Wt 209.2 lb

## 2010-09-16 DIAGNOSIS — K625 Hemorrhage of anus and rectum: Secondary | ICD-10-CM

## 2010-09-16 NOTE — Progress Notes (Signed)
  Subjective:    Patient ID: Matthew Adkins, male    DOB: 07-31-1950, 60 y.o.   MRN: [id number]  PCP: [facility]  HPI Pt has a personal history of polyps. No path available. SAW BLOOD ONE TIME IN THE STOOL AND WHEN HE WIPED. Last TCS: 2-3 years ago. No problems with sedation. No nausea, vomiting, melena, diarrhea, constipation, problems swallowing or heartburn or indigestion.  Past Medical History  Diagnosis Date  . Bladder cancer   . Nicotine addiction   . Seasonal allergies     Past Surgical History  Procedure Date  . Transurethral resection of bladder 2000  . Athroscopy of right knee 2002  . Foot surgery 2004  . Colonoscopy [facility]   Allergies  Allergen Reactions  . Codeine     Current Outpatient Prescriptions  Medication Sig Dispense Refill  . amoxicillin-clarithromycin-lansoprazole (PREVPAC) combo pack Take by mouth 2 (two) times daily. Follow package directions.  1 kit  0  . aspirin (ASPIRIN LOW DOSE) 81 MG EC tablet Take 81 mg by mouth daily.        Marland Kitchen FLUoxetine (PROZAC) 10 MG capsule Take 1 capsule (10 mg total) by mouth daily.  30 capsule  2  . Multiple Vitamins-Minerals (CENTRUM PO) Take 1 tablet by mouth daily.       . Tamsulosin HCl (FLOMAX) 0.4 MG CAPS Take 0.4 mg by mouth daily after supper.       . fluticasone (FLONASE) 50 MCG/ACT nasal spray Place 1 spray into the nose 3 times/day as needed-between meals & bedtime. congestion       . HYDROcodone-acetaminophen (VICODIN) 5-500 MG per tablet Take 1 tablet by mouth every 4 (four) hours as needed. For pain        Family History  Problem Relation Age of Onset  . Heart attack Father   . Diabetes Sister   . Hypertension Sister   . Aneurysm Sister     brain   . Arthritis      family history   . Colon cancer Neg Hx   . Colon polyps Neg Hx     History   Social History  . Marital Status: Married    Spouse Name: N/A    Number of Children: 2   Occupational History  . unemployed     Social History Main Topics    . Smoking status: Current Everyday Smoker -- 0.3 packs/day    Types: Cigarettes  .    Marland Kitchen Alcohol Use: Yes  . Drug Use: No  .     Review of Systems  All other systems reviewed and are negative.       Objective:   Physical Exam  Vitals reviewed. Constitutional: He is oriented to person, place, and time. He appears well-developed and well-nourished. No distress.  HENT:  Head: Normocephalic and atraumatic.  Eyes: Pupils are equal, round, and reactive to light. No scleral icterus.  Neck: Normal range of motion. Neck supple.  Cardiovascular: Normal rate, regular rhythm and normal heart sounds.   Pulmonary/Chest: Effort normal and breath sounds normal.  Abdominal: Soft. Bowel sounds are normal. He exhibits no distension.  Lymphadenopathy:    He has no cervical adenopathy.  Neurological: He is alert and oriented to person, place, and time.          Assessment & Plan:

## 2010-09-17 ENCOUNTER — Ambulatory Visit (INDEPENDENT_AMBULATORY_CARE_PROVIDER_SITE_OTHER): Payer: BC Managed Care – PPO | Admitting: Orthopedic Surgery

## 2010-09-17 ENCOUNTER — Encounter: Payer: Self-pay | Admitting: Orthopedic Surgery

## 2010-09-17 ENCOUNTER — Other Ambulatory Visit: Payer: Self-pay | Admitting: Gastroenterology

## 2010-09-17 DIAGNOSIS — M1710 Unilateral primary osteoarthritis, unspecified knee: Secondary | ICD-10-CM

## 2010-09-17 DIAGNOSIS — K625 Hemorrhage of anus and rectum: Secondary | ICD-10-CM

## 2010-09-17 DIAGNOSIS — M5136 Other intervertebral disc degeneration, lumbar region: Secondary | ICD-10-CM

## 2010-09-17 DIAGNOSIS — M5137 Other intervertebral disc degeneration, lumbosacral region: Secondary | ICD-10-CM

## 2010-09-17 NOTE — Patient Instructions (Addendum)
Attention: You have  indicated that you are still smoking.  If you are interested in quitting please see your primary care physician regarding measures you can take to improve your health and stop smoking.   You have received a steroid shot. 15% of patients experience increased pain at the injection site with in the next 24 hours. This is best treated with ice and tylenol extra strength 2 tabs every 8 hours. If you are still having pain please call the office.

## 2010-09-17 NOTE — Progress Notes (Signed)
Follow up for x-rays on his lumbar spine based on his RIGHT lower extremity pain radicular symptoms and giving way of the RIGHT leg  The patient is ready to retire however his employer as advised him to reconsider  He is unable to continue working because of the pain he is having when he is ambulating.  He noticed when he was out of work over the summer and the holiday he could function just fine.  I will speak to the retirement coordinator at care for [phone] retirement Ms Waynette Buttery discuss his disability  We injected his LEFT knee  His RIGHT knee has been treated with Synvisc injections and has been injected and he is on hydrocodone for pain although he doesn't take it  Diagnoses #1 osteoarthritis RIGHT knee  Diagnosis #2 osteoarthritis LEFT knee  Diagnosis #3 x-rays today show lumbar degenerative disc disease with scoliosis reactive.  Followup later for preop for knee replacement.

## 2010-09-22 ENCOUNTER — Ambulatory Visit: Payer: BC Managed Care – PPO | Admitting: Family Medicine

## 2010-09-24 ENCOUNTER — Encounter: Payer: Self-pay | Admitting: Gastroenterology

## 2010-09-24 DIAGNOSIS — K625 Hemorrhage of anus and rectum: Secondary | ICD-10-CM | POA: Insufficient documentation

## 2010-09-24 NOTE — Assessment & Plan Note (Addendum)
Most likely 2o to hemorrhoids OR PROCTITIS, LESS LIKELY COLON CA OR POLYPS  REPORT FROM [facility] REVIEWED. POLYP REMOVED BUT NOT RETRIEVED. TCS 9/21-MOVIPREP. OPV PRN.

## 2010-09-25 ENCOUNTER — Telehealth: Payer: Self-pay | Admitting: Gastroenterology

## 2010-09-25 MED ORDER — SODIUM CHLORIDE 0.45 % IV SOLN
Freq: Once | INTRAVENOUS | Status: AC
  Administered 2010-09-26: 12:00:00 via INTRAVENOUS

## 2010-09-25 NOTE — Telephone Encounter (Signed)
Pts procedure time changed due to a cancellation- he is aware of the change and ok with it

## 2010-09-26 ENCOUNTER — Encounter (HOSPITAL_COMMUNITY): Payer: Self-pay | Admitting: *Deleted

## 2010-09-26 ENCOUNTER — Other Ambulatory Visit: Payer: Self-pay | Admitting: Gastroenterology

## 2010-09-26 ENCOUNTER — Ambulatory Visit (HOSPITAL_COMMUNITY)
Admission: RE | Admit: 2010-09-26 | Discharge: 2010-09-26 | Disposition: A | Discharge status: Home / Self Care | Payer: BC Managed Care – PPO | Source: Ambulatory Visit | Attending: Gastroenterology | Admitting: Gastroenterology

## 2010-09-26 ENCOUNTER — Encounter (HOSPITAL_COMMUNITY)
Admission: RE | Disposition: A | Discharge status: Home / Self Care | Payer: Self-pay | Source: Ambulatory Visit | Attending: Gastroenterology

## 2010-09-26 DIAGNOSIS — K648 Other hemorrhoids: Secondary | ICD-10-CM

## 2010-09-26 DIAGNOSIS — Z860100 Personal history of colon polyps, unspecified: Secondary | ICD-10-CM | POA: Insufficient documentation

## 2010-09-26 DIAGNOSIS — D126 Benign neoplasm of colon, unspecified: Secondary | ICD-10-CM | POA: Insufficient documentation

## 2010-09-26 DIAGNOSIS — K625 Hemorrhage of anus and rectum: Secondary | ICD-10-CM

## 2010-09-26 DIAGNOSIS — K5730 Diverticulosis of large intestine without perforation or abscess without bleeding: Secondary | ICD-10-CM

## 2010-09-26 DIAGNOSIS — K921 Melena: Secondary | ICD-10-CM | POA: Insufficient documentation

## 2010-09-26 HISTORY — PX: COLONOSCOPY: SHX5424

## 2010-09-26 SURGERY — COLONOSCOPY
Anesthesia: Moderate Sedation

## 2010-09-26 MED ORDER — MIDAZOLAM HCL 5 MG/5ML IJ SOLN
INTRAMUSCULAR | Status: DC | PRN
  Administered 2010-09-26: 1 mg via INTRAVENOUS
  Administered 2010-09-26: 2 mg via INTRAVENOUS
  Administered 2010-09-26: 1 mg via INTRAVENOUS

## 2010-09-26 MED ORDER — MEPERIDINE HCL 100 MG/ML IJ SOLN
INTRAMUSCULAR | Status: AC
  Filled 2010-09-26: qty 2

## 2010-09-26 MED ORDER — MEPERIDINE HCL 100 MG/ML IJ SOLN
INTRAMUSCULAR | Status: DC | PRN
  Administered 2010-09-26: 50 mg via INTRAVENOUS
  Administered 2010-09-26: 25 mg via INTRAVENOUS

## 2010-09-26 MED ORDER — MIDAZOLAM HCL 5 MG/5ML IJ SOLN
INTRAMUSCULAR | Status: AC
  Filled 2010-09-26: qty 10

## 2010-09-26 NOTE — Interval H&P Note (Signed)
History and Physical Interval Note:   09/26/2010   12:35 PM   Quillian Quince  has presented today for surgery, with the diagnosis of rectal bleeding  The various methods of treatment have been discussed with the patient and family. After consideration of risks, benefits and other options for treatment, the patient has consented to  Procedure(s): COLONOSCOPY as a surgical intervention .  I have reviewed the patients' chart and labs.  Questions were answered to the patient's satisfaction.     Jonette Eva  MD

## 2010-09-26 NOTE — H&P (Signed)
Reason for Visit     Rectal Bleeding        Vitals - Last Recorded       BP Pulse Temp(Src) Ht Wt BMI    133/76  75  97.7 F (36.5 C) (Temporal)  6\' 3"  (1.905 m)  209 lb 3.2 oz (94.892 kg)  26.15 kg/m2          Progress Notes     Jonette Eva, MD  09/24/2010  1:53 PM  Signed    Subjective:      Patient ID: Matthew Adkins, male    DOB: 20-Jun-1950, 60 y.o.   MRN: [id number]   PCP: [facility]   HPI Pt has a personal history of polyps. No path available. SAW BLOOD ONE TIME IN THE STOOL AND WHEN HE WIPED. Last TCS: 2-3 years ago. No problems with sedation. No nausea, vomiting, melena, diarrhea, constipation, problems swallowing or heartburn or indigestion.    Past Medical History   Diagnosis  Date   .  Bladder cancer     .  Nicotine addiction     .  Seasonal allergies         Past Surgical History   Procedure  Date   .  Transurethral resection of bladder  2000   .  Athroscopy of right knee  2002   .  Foot surgery  2004   .  Colonoscopy  [facility]    Allergies   Allergen  Reactions   .  Codeine         Current Outpatient Prescriptions   Medication  Sig  Dispense  Refill   .  amoxicillin-clarithromycin-lansoprazole (PREVPAC) combo pack  Take by mouth 2 (two) times daily. Follow package directions.   1 kit   0   .  aspirin (ASPIRIN LOW DOSE) 81 MG EC tablet  Take 81 mg by mouth daily.           Marland Kitchen  FLUoxetine (PROZAC) 10 MG capsule  Take 1 capsule (10 mg total) by mouth daily.   30 capsule   2   .  Multiple Vitamins-Minerals (CENTRUM PO)  Take 1 tablet by mouth daily.          .  Tamsulosin HCl (FLOMAX) 0.4 MG CAPS  Take 0.4 mg by mouth daily after supper.          .  fluticasone (FLONASE) 50 MCG/ACT nasal spray  Place 1 spray into the nose 3 times/day as needed-between meals & bedtime. congestion          .  HYDROcodone-acetaminophen (VICODIN) 5-500 MG per tablet  Take 1 tablet by mouth every 4 (four) hours as needed. For pain             Family History   Problem   Relation  Age of Onset   .  Heart attack  Father     .  Diabetes  Sister     .  Hypertension  Sister     .  Aneurysm  Sister         brain    .  Arthritis           family history    .  Colon cancer  Neg Hx     .  Colon polyps  Neg Hx         History       Social History   .  Marital Status:  Married  Spouse Name:  N/A       Number of Children:  2       Occupational History   .  unemployed          Social History Main Topics   .  Smoking status:  Current Everyday Smoker -- 0.3 packs/day       Types:  Cigarettes   .       Marland Kitchen  Alcohol Use:  Yes   .  Drug Use:  No   .          Review of Systems  All other systems reviewed and are negative.     Objective:    Physical Exam  Vitals reviewed. Constitutional: He is oriented to person, place, and time. He appears well-developed and well-nourished. No distress.  HENT:   Head: Normocephalic and atraumatic.  Eyes: Pupils are equal, round, and reactive to light. No scleral icterus.  Neck: Normal range of motion. Neck supple.  Cardiovascular: Normal rate, regular rhythm and normal heart sounds.   Pulmonary/Chest: Effort normal and breath sounds normal.  Abdominal: Soft. Bowel sounds are normal. He exhibits no distension.  Lymphadenopathy:    He has no cervical adenopathy.  Neurological: He is alert and oriented to person, place, and time.  Assessment & Plan:              BRBPR (bright red blood per rectum) - Jonette Eva, MD  09/24/2010  1:51 PM  Addendum Most likely 2o to hemorrhoids OR PROCTITIS, LESS LIKELY COLON CA OR POLYPS   REPORT FROM [facility] REVIEWED. POLYP REMOVED BUT NOT RETRIEVED. TCS 9/21-MOVIPREP. OPV PRN.

## 2010-09-29 ENCOUNTER — Telehealth: Payer: Self-pay | Admitting: Family Medicine

## 2010-09-29 NOTE — Telephone Encounter (Signed)
noted 

## 2010-09-30 ENCOUNTER — Telehealth: Payer: Self-pay | Admitting: Gastroenterology

## 2010-09-30 NOTE — Telephone Encounter (Signed)
Please call pt. He had simple adenomas removed from hIS colon. TCS in 10 years. High fiber diet.

## 2010-09-30 NOTE — Telephone Encounter (Signed)
Pt informed

## 2010-10-01 NOTE — Telephone Encounter (Signed)
Reminder in epic to have tcs in 10 years 

## 2010-10-03 ENCOUNTER — Encounter (HOSPITAL_COMMUNITY): Payer: Self-pay | Admitting: Gastroenterology

## 2010-10-30 ENCOUNTER — Telehealth: Payer: Self-pay | Admitting: Orthopedic Surgery

## 2010-10-30 NOTE — Telephone Encounter (Signed)
Called patient, relayed that form is ready,  per Dr. Romeo Apple.

## 2010-11-05 ENCOUNTER — Encounter: Payer: Self-pay | Admitting: Family Medicine

## 2010-11-10 ENCOUNTER — Ambulatory Visit (INDEPENDENT_AMBULATORY_CARE_PROVIDER_SITE_OTHER): Payer: BC Managed Care – PPO | Admitting: Family Medicine

## 2010-11-10 ENCOUNTER — Encounter: Payer: Self-pay | Admitting: Family Medicine

## 2010-11-10 VITALS — BP 118/74 | HR 72 | Resp 16 | Ht 75.0 in | Wt 208.0 lb

## 2010-11-10 DIAGNOSIS — F17200 Nicotine dependence, unspecified, uncomplicated: Secondary | ICD-10-CM

## 2010-11-10 DIAGNOSIS — M1990 Unspecified osteoarthritis, unspecified site: Secondary | ICD-10-CM

## 2010-11-10 DIAGNOSIS — G4700 Insomnia, unspecified: Secondary | ICD-10-CM

## 2010-11-10 DIAGNOSIS — M25569 Pain in unspecified knee: Secondary | ICD-10-CM

## 2010-11-10 DIAGNOSIS — R7309 Other abnormal glucose: Secondary | ICD-10-CM

## 2010-11-10 DIAGNOSIS — E739 Lactose intolerance, unspecified: Secondary | ICD-10-CM

## 2010-11-10 DIAGNOSIS — F329 Major depressive disorder, single episode, unspecified: Secondary | ICD-10-CM

## 2010-11-10 DIAGNOSIS — R7303 Prediabetes: Secondary | ICD-10-CM

## 2010-11-10 DIAGNOSIS — M129 Arthropathy, unspecified: Secondary | ICD-10-CM

## 2010-11-10 DIAGNOSIS — F3289 Other specified depressive episodes: Secondary | ICD-10-CM

## 2010-11-10 MED ORDER — TEMAZEPAM 15 MG PO CAPS: 15.0000 mg | ORAL_CAPSULE | Freq: Every evening | ORAL | Status: DC | PRN

## 2010-11-10 NOTE — Progress Notes (Signed)
  Subjective:    Patient ID: Matthew Adkins, male    DOB: [birth year], 60 y.o.   MRN: [id number]  HPI Pt has upcoming right knee surgery and was recently taken out of work by ortho for this. States both knees are bad Was not taking fluoxetine. He went to Ed at [facility] with dizziness and light headedness and has since started the medication, feels better. C/o poor sleep, request med. Smoking 10 ciggs/day , and wants quit before his surgery   Review of Systems See HPI Denies recent fever or chills. Denies sinus pressure, nasal congestion, ear pain or sore throat. Denies chest congestion, productive cough or wheezing. Denies chest pains, palpitations and leg swelling Denies abdominal pain, nausea, vomiting,diarrhea or constipation.   Denies dysuria, frequency, hesitancy or incontinence. Denies headaches, seizures, numbness, or tingling. Reports improvement in depression, at one time had been inconsistent in taking his medication, and actually ended up in the ED at [facility] as a result. Since then he has been consistent. C/o difficulty falling and staying asleep, a chronic [problem, his sleep habits are also poor Denies skin break down or rash.        Objective:   Physical Exam Patient alert and oriented and in no cardiopulmonary distress.  HEENT: No facial asymmetry, EOMI, no sinus tenderness,  oropharynx pink and moist.  Neck supple no adenopathy.  Chest: Clear to auscultation bilaterally.  CVS: S1, S2 no murmurs, no S3.  ABD: Soft non tender. Bowel sounds normal.  Ext: No edema  MS: Adequate ROM spine, shoulders, hips and knees.  Skin: Intact, no ulcerations or rash noted.  Psych: Good eye contact, normal affect. Memory intact not anxious or depressed appearing.  CNS: CN 2-12 intact, power, tone and sensation normal throughout.        Assessment & Plan:

## 2010-11-10 NOTE — Patient Instructions (Addendum)
F/u in 4 months.  You were prediabetic in June, you need HBA1C today, and you also need to follow a low carb diet   Please think about quitting smoking.  This is very important for your health.  Consider setting a quit date, then cutting back or switching brands to prepare to stop.  Also think of the money you will save every day by not smoking.  Quick Tips to Quit Smoking: Fix a date i.e. keep a date in mind from when you would not touch a tobacco product to smoke  Keep yourself busy and block your mind with work loads or reading books or watching movies in malls where smoking is not allowed  Vanish off the things which reminds you about smoking for example match box, or your favorite lighter, or the pipe you used for smoking, or your favorite jeans and shirt with which you used to enjoy smoking, or the club where you used to do smoking  Try to avoid certain people places and incidences where and with whom smoking is a common factor to add on  Praise yourself with some token gifts from the money you saved by stopping smoking  Anti Smoking teams are there to help you. Join their programs  Anti-smoking Gums are there in many medical shops. Try them to quit smoking   Side-effects of Smoking: Disease caused by smoking cigarettes are emphysema, bronchitis, heart failures  Premature death  Cancer is the major side effect of smoking  Heart attacks and strokes are the quick effects of smoking causing sudden death  Some smokers lives end up with limbs amputated  Breathing problem or fast breathing is another side effect of smoking  Due to more intakes of smokes, carbon mono-oxide goes into your brain and other muscles of the body which leads to swelling of the veins and blockage to the air passage to lungs  Carbon monoxide blocks blood vessels which leads to blockage in the flow of blood to different major body organs like heart lungs and thus leads to attacks and deaths  During pregnancy smoking is  very harmful and leads to premature birth of the infant, spontaneous abortions, low weight of the infant during birth  Fat depositions to narrow and blocked blood vessels causing heart attacks  In many cases cigarette smoking caused infertility in men    HBa1C in 4 months  New med to help with sleep, you also need to practice good sleep habits

## 2010-11-14 LAB — HEMOGLOBIN A1C: Mean Plasma Glucose: 128 mg/dL — ABNORMAL HIGH (ref <117)

## 2010-11-16 NOTE — Assessment & Plan Note (Signed)
Sleep hygiene discussed, med to be started

## 2010-11-16 NOTE — Assessment & Plan Note (Signed)
Elevated HBa1C, counseled re necessary dietary changes and encouraged to go to class also

## 2010-11-16 NOTE — Assessment & Plan Note (Signed)
Out of work currently and ha upcoming surgery

## 2010-11-16 NOTE — Assessment & Plan Note (Signed)
Unchanged , cessation counseling done 

## 2010-11-16 NOTE — Assessment & Plan Note (Signed)
Improved on medicatopn

## 2010-11-17 ENCOUNTER — Other Ambulatory Visit: Payer: Self-pay | Admitting: Family Medicine

## 2010-12-18 ENCOUNTER — Encounter: Payer: Self-pay | Admitting: Orthopedic Surgery

## 2010-12-18 ENCOUNTER — Ambulatory Visit (INDEPENDENT_AMBULATORY_CARE_PROVIDER_SITE_OTHER): Payer: BC Managed Care – PPO | Admitting: Orthopedic Surgery

## 2010-12-18 VITALS — Ht 75.0 in | Wt 202.0 lb

## 2010-12-18 DIAGNOSIS — M1710 Unilateral primary osteoarthritis, unspecified knee: Secondary | ICD-10-CM

## 2010-12-18 NOTE — Progress Notes (Signed)
Routine followup.  Scheduled to have a preop today, but his wife has to have I nerve stimulator put in her back, but wants to wait.  He will have some new papers do need to start from today regarding a Management consultant   New papers for car loan will start from today

## 2010-12-18 NOTE — Patient Instructions (Addendum)
Call us in Jan for pre op

## 2011-01-20 ENCOUNTER — Ambulatory Visit: Payer: BC Managed Care – PPO | Admitting: Orthopedic Surgery

## 2011-01-21 ENCOUNTER — Encounter: Payer: Self-pay | Admitting: Family Medicine

## 2011-01-22 ENCOUNTER — Ambulatory Visit (INDEPENDENT_AMBULATORY_CARE_PROVIDER_SITE_OTHER): Payer: BC Managed Care – PPO | Admitting: Family Medicine

## 2011-01-22 ENCOUNTER — Encounter: Payer: Self-pay | Admitting: Family Medicine

## 2011-01-22 VITALS — BP 120/80 | HR 81 | Resp 16 | Ht 75.0 in | Wt 203.8 lb

## 2011-01-22 DIAGNOSIS — E739 Lactose intolerance, unspecified: Secondary | ICD-10-CM

## 2011-01-22 DIAGNOSIS — M25569 Pain in unspecified knee: Secondary | ICD-10-CM

## 2011-01-22 DIAGNOSIS — K148 Other diseases of tongue: Secondary | ICD-10-CM

## 2011-01-22 DIAGNOSIS — K625 Hemorrhage of anus and rectum: Secondary | ICD-10-CM

## 2011-01-22 DIAGNOSIS — F17200 Nicotine dependence, unspecified, uncomplicated: Secondary | ICD-10-CM

## 2011-01-22 DIAGNOSIS — F329 Major depressive disorder, single episode, unspecified: Secondary | ICD-10-CM

## 2011-01-22 DIAGNOSIS — G4700 Insomnia, unspecified: Secondary | ICD-10-CM

## 2011-01-22 NOTE — Assessment & Plan Note (Signed)
Smokes about 7 per day, will start patches

## 2011-01-22 NOTE — Patient Instructions (Signed)
F/U in 3.5 months.  You are referred to eNT to evaluate the lesion on your tongue.  You need to quit smoking, I recommend use of the patches, lowest strength 7 for 3 weeks, then stop, no smoking with patch   HBA1C today, and in 3.5 months before next visit, you are prediabetic and need to follow a low carb diet to prevent diabetes.  Continue current medication

## 2011-01-22 NOTE — Assessment & Plan Note (Signed)
improved

## 2011-01-23 LAB — HEMOGLOBIN A1C
Hgb A1c MFr Bld: 6.2 % — ABNORMAL HIGH (ref <5.7)
Mean Plasma Glucose: 131 mg/dL — ABNORMAL HIGH (ref <117)

## 2011-01-26 NOTE — Assessment & Plan Note (Signed)
Reports difficulty falling and staying asleep, will prescribe med, sleep hygiene discussed

## 2011-01-26 NOTE — Assessment & Plan Note (Signed)
Recurrent episodes, recently evaluated by GI

## 2011-01-26 NOTE — Assessment & Plan Note (Signed)
Hyperpigmented lesion on tongue irregular border, varied pigmentation, long h/o nicotine, will refer to ENT

## 2011-01-26 NOTE — Progress Notes (Signed)
  Subjective:    Patient ID: Matthew Adkins, male    DOB: [birth year], 61 y.o.   MRN: [id number]  HPI The PT is here for follow up and re-evaluation of chronic medical conditions, medication management and review of any available recent lab and radiology data.  Preventive health is updated, specifically  Cancer screening and Immunization.   Questions or concerns regarding consultations or procedures which the PT has had in the interim are  addressed. The PT denies any adverse reactions to current medications since the last visit.  C/o dark lesion on his tongue, painless and not bleeding, noticed approx 1 week ago Poor sleep, needs med to sleep adequately, no regular exercise, ongoing nicotine use, wants to quit and will use patches to  Help, no quit date set     Review of Systems See HPI Denies recent fever or chills. Denies sinus pressure, nasal congestion, ear pain or sore throat. Denies chest congestion, productive cough or wheezing. Denies chest pains, palpitations and leg swelling Denies abdominal pain, nausea, vomiting,diarrhea or constipation.   Denies dysuria, frequency, hesitancy or incontinence. Denies joint pain, swelling and limitation in mobility. Denies headaches, seizures, numbness, or tingling. Denies uncontrolled  depression, anxiety or insomnia. Denies skin break down or rash.        Objective:   Physical Exam Patient alert and oriented and in no cardiopulmonary distress.  HEENT: No facial asymmetry, EOMI, no sinus tenderness,  oropharynx pink and moist.  Neck supple no adenopathy.Tongue has hyperpigmented lesion posteriorly, irreg border and irreg pigmentation Chest: Clear to auscultation bilaterally.decreased air entry bilaterally  CVS: S1, S2 no murmurs, no S3.  ABD: Soft non tender. Bowel sounds normal.  Ext: No edema  MS: Adequate ROM spine, shoulders, hips and knees.  Skin: Intact, no ulcerations or rash noted.  Psych: Good eye contact, normal  affect. Memory intact not anxious or depressed appearing.  CNS: CN 2-12 intact, power, tone and sensation normal throughout.        Assessment & Plan:

## 2011-01-26 NOTE — Assessment & Plan Note (Signed)
Persistently elevated HBA1C, fam h/o DM, counseled again re need to reduce carbs and start regular exercise, will follow every 4 month

## 2011-01-26 NOTE — Assessment & Plan Note (Signed)
Followed by ortho, maintained on pain med, plan for surgery

## 2011-02-24 ENCOUNTER — Ambulatory Visit (HOSPITAL_COMMUNITY): Payer: BC Managed Care – PPO | Admitting: Oncology

## 2011-02-24 ENCOUNTER — Telehealth: Payer: Self-pay | Admitting: Family Medicine

## 2011-02-24 ENCOUNTER — Other Ambulatory Visit: Payer: Self-pay | Admitting: Family Medicine

## 2011-02-24 DIAGNOSIS — G4730 Sleep apnea, unspecified: Secondary | ICD-10-CM

## 2011-02-24 NOTE — Telephone Encounter (Signed)
Pt and spouse c/o excessive fatigue and daytime sleepiness, snoring and stopping breathing while asleep states he was diagnosed with sleep apnea in the past. Pls sched appt for sleep study at [facility], referral is entered

## 2011-03-04 ENCOUNTER — Ambulatory Visit (INDEPENDENT_AMBULATORY_CARE_PROVIDER_SITE_OTHER): Payer: BC Managed Care – PPO | Admitting: Orthopedic Surgery

## 2011-03-04 ENCOUNTER — Encounter: Payer: Self-pay | Admitting: Orthopedic Surgery

## 2011-03-04 VITALS — BP 98/64 | Ht 75.0 in | Wt 203.0 lb

## 2011-03-04 DIAGNOSIS — M1710 Unilateral primary osteoarthritis, unspecified knee: Secondary | ICD-10-CM

## 2011-03-04 DIAGNOSIS — M179 Osteoarthritis of knee, unspecified: Secondary | ICD-10-CM | POA: Insufficient documentation

## 2011-03-04 NOTE — Progress Notes (Signed)
Patient ID: Matthew Adkins, male   DOB: 24-Feb-1950, 61 y.o.   MRN: [id number] Chief Complaint  Patient presents with  . Follow-up    recheck and xray bilateral knees/ discuss surgery   The patient has recently stopped working because of his knees and he has noticed A decrease in the amount of pain he said.  He has some discomfort here and there but his pain is now 2/10.  There is no locking catching or giving way his pain is primarily medial he has not required any medications  He has x-rays scheduled for today  X-rays show severe disease both knees RIGHT greater than LEFT with medial joint space narrowing multiple osteophytes sclerosis and subchondral cyst formation and loose bodies and severe medial joint space narrowing.  However he is asymptomatic for the most part and we will x-ray him again in July.

## 2011-03-04 NOTE — Patient Instructions (Signed)
Instructions were given to perform activities as tolerated call us sooner if things start to change in terms of increasing pain

## 2011-03-09 ENCOUNTER — Telehealth: Payer: Self-pay | Admitting: Orthopedic Surgery

## 2011-03-09 NOTE — Telephone Encounter (Signed)
He is not a candidate due to the severe condition of his knee

## 2011-03-09 NOTE — Telephone Encounter (Signed)
Matthew Adkins asking if he can get Synvisc injections in his left knee. His # [phone]

## 2011-03-10 ENCOUNTER — Ambulatory Visit: Payer: BC Managed Care – PPO | Admitting: Family Medicine

## 2011-03-10 NOTE — Telephone Encounter (Signed)
Tried to call, no answer

## 2011-03-16 ENCOUNTER — Telehealth: Payer: Self-pay | Admitting: Orthopedic Surgery

## 2011-03-16 NOTE — Telephone Encounter (Signed)
Per request regarding patient's form for disability/out of work ([employer]), form + medical office notes had been faxed 03/13/11 (and re-faxed 03/16/11) to attn: Despina Hick, short term disability dpeartment to fax # [phone], ph# [phone] ext [phone].

## 2011-03-16 NOTE — Telephone Encounter (Signed)
Reached the patient, relayed doctor's answer

## 2011-04-10 ENCOUNTER — Other Ambulatory Visit: Payer: Self-pay | Admitting: Family Medicine

## 2011-04-27 ENCOUNTER — Telehealth: Payer: Self-pay | Admitting: Family Medicine

## 2011-05-06 ENCOUNTER — Other Ambulatory Visit: Payer: Self-pay | Admitting: Family Medicine

## 2011-05-06 ENCOUNTER — Other Ambulatory Visit: Payer: Self-pay

## 2011-05-06 MED ORDER — EPINEPHRINE 0.3 MG/0.3ML IJ DEVI: INTRAMUSCULAR | Status: DC

## 2011-05-22 ENCOUNTER — Ambulatory Visit: Payer: BC Managed Care – PPO | Admitting: Family Medicine

## 2011-06-02 ENCOUNTER — Ambulatory Visit: Payer: BC Managed Care – PPO | Admitting: Family Medicine

## 2011-06-30 ENCOUNTER — Ambulatory Visit (INDEPENDENT_AMBULATORY_CARE_PROVIDER_SITE_OTHER): Payer: BC Managed Care – PPO

## 2011-06-30 ENCOUNTER — Encounter: Payer: Self-pay | Admitting: Orthopedic Surgery

## 2011-06-30 ENCOUNTER — Ambulatory Visit (INDEPENDENT_AMBULATORY_CARE_PROVIDER_SITE_OTHER): Payer: BC Managed Care – PPO | Admitting: Orthopedic Surgery

## 2011-06-30 ENCOUNTER — Ambulatory Visit: Payer: BC Managed Care – PPO

## 2011-06-30 ENCOUNTER — Ambulatory Visit (INDEPENDENT_AMBULATORY_CARE_PROVIDER_SITE_OTHER): Payer: BC Managed Care – PPO | Admitting: Family Medicine

## 2011-06-30 ENCOUNTER — Encounter: Payer: Self-pay | Admitting: Family Medicine

## 2011-06-30 VITALS — BP 140/100 | Ht 75.0 in | Wt 201.0 lb

## 2011-06-30 VITALS — BP 130/80 | HR 58 | Resp 18 | Ht 75.0 in | Wt 205.1 lb

## 2011-06-30 DIAGNOSIS — M25569 Pain in unspecified knee: Secondary | ICD-10-CM

## 2011-06-30 DIAGNOSIS — F17200 Nicotine dependence, unspecified, uncomplicated: Secondary | ICD-10-CM

## 2011-06-30 DIAGNOSIS — E739 Lactose intolerance, unspecified: Secondary | ICD-10-CM

## 2011-06-30 DIAGNOSIS — M1710 Unilateral primary osteoarthritis, unspecified knee: Secondary | ICD-10-CM

## 2011-06-30 DIAGNOSIS — K148 Other diseases of tongue: Secondary | ICD-10-CM

## 2011-06-30 DIAGNOSIS — C679 Malignant neoplasm of bladder, unspecified: Secondary | ICD-10-CM

## 2011-06-30 DIAGNOSIS — F329 Major depressive disorder, single episode, unspecified: Secondary | ICD-10-CM

## 2011-06-30 LAB — HEMOGLOBIN A1C: Mean Plasma Glucose: 131 mg/dL — ABNORMAL HIGH (ref <117)

## 2011-06-30 MED ORDER — BUPROPION HCL ER (SR) 150 MG PO TB12: 150.0000 mg | ORAL_TABLET | Freq: Two times a day (BID) | ORAL | Status: DC

## 2011-06-30 NOTE — Patient Instructions (Signed)
Call when you are ready for surgery

## 2011-06-30 NOTE — Patient Instructions (Addendum)
F/u in 4 month  You need to quit smoking by July 1, new med to be started to help with this  You are referred for a chest CT scan  You need HBA1C today    Other  Fasting labs will be ordered when I have info from Dr Chancy Milroy office.  Blood pressure is normal  You will be referred to a new urologist when I get your old notes

## 2011-06-30 NOTE — Progress Notes (Signed)
  Subjective:    Patient ID: Quillian Quince, male    DOB: 24-Jul-1950, 61 y.o.   MRN: [id number] Chief Complaint  Patient presents with  . Follow-up    recheck bilat knees, work status?    HPI chief complaint is bilateral knee pain. Patient has bilateral os arthritis of the knee the right knee is worse than the left on x-ray but he has pain in either side occasionally. Right now he is fairly asymptomatic since he has not had to walk at work. He is on extended leave at this time.  The bony bilateral knee replacements. His social situation with his wife has improved as she is beginning to walk and he does not have to provide 24 7 care.  His stiffness is noted in the morning and after riding for long distances and is primarily medially.  He has no catching locking or giving way.    Review of Systems     Objective:   Physical Exam  Examination deferred RF x-rays bilateral knees bilateral osteoarthritis right worse on left on x-ray both knees could stand knee replacement surgery which we discussed today.      Assessment & Plan:  Osteoarthritis both knees we discussed knee replacement surgery when the patient has severe pain. He says he would like to have his knees done in about a month. He will call us when he is ready and we will schedule him for knee replacement per his request right versus left

## 2011-07-20 ENCOUNTER — Ambulatory Visit (HOSPITAL_COMMUNITY)
Admission: RE | Admit: 2011-07-20 | Discharge: 2011-07-20 | Disposition: A | Discharge status: Home / Self Care | Payer: BC Managed Care – PPO | Source: Ambulatory Visit | Attending: Family Medicine | Admitting: Family Medicine

## 2011-07-20 DIAGNOSIS — R911 Solitary pulmonary nodule: Secondary | ICD-10-CM | POA: Insufficient documentation

## 2011-07-20 DIAGNOSIS — F17200 Nicotine dependence, unspecified, uncomplicated: Secondary | ICD-10-CM

## 2011-07-20 DIAGNOSIS — C679 Malignant neoplasm of bladder, unspecified: Secondary | ICD-10-CM | POA: Insufficient documentation

## 2011-07-20 MED ORDER — IOHEXOL 300 MG/ML  SOLN
80.0000 mL | Freq: Once | INTRAMUSCULAR | Status: AC | PRN
  Administered 2011-07-20: 80 mL via INTRAVENOUS

## 2011-07-21 ENCOUNTER — Other Ambulatory Visit: Payer: Self-pay | Admitting: Family Medicine

## 2011-07-21 DIAGNOSIS — Z8551 Personal history of malignant neoplasm of bladder: Secondary | ICD-10-CM

## 2011-07-21 DIAGNOSIS — R911 Solitary pulmonary nodule: Secondary | ICD-10-CM

## 2011-07-21 DIAGNOSIS — F17210 Nicotine dependence, cigarettes, uncomplicated: Secondary | ICD-10-CM

## 2011-07-21 NOTE — Assessment & Plan Note (Signed)
Pt states a wiullingness to quit ,  Quit date has been set, he will start medication to assist with this

## 2011-07-21 NOTE — Progress Notes (Signed)
  Subjective:    Patient ID: Matthew Adkins, male    DOB: 1950/05/24, 61 y.o.   MRN: [id number]  HPI The PT is here for follow up and re-evaluation of chronic medical conditions, medication management and review of any available recent lab and radiology data.He was sent in from orthopedic office after being told that his blood pressure was high, this would be a new diagnosis  Preventive health is updated, specifically  Cancer screening and Immunization.   Questions or concerns regarding consultations or procedures which the PT has had in the interim are  addressed. The PT denies any adverse reactions to current medications since the last visit.  States he really wants to stop smoking and wants help ith medication for this.Unwilling to pin down a quit date Expects knee surgery later this year    Review of Systems See HPI Denies recent fever or chills. Denies sinus pressure, nasal congestion, ear pain or sore throat. Denies chest congestion, productive cough or wheezing. Denies chest pains, palpitations and leg swelling Denies abdominal pain, nausea, vomiting,diarrhea or constipation.   Denies dysuria, frequency, hesitancy or incontinence. Denies headaches, seizures, numbness, or tingling. Denies uncontrolled  depression, anxiety or insomnia. Denies skin break down or rash.        Objective:   Physical Exam  Patient alert and oriented and in no cardiopulmonary distress.  HEENT: No facial asymmetry, EOMI, no sinus tenderness,  oropharynx pink and moist.  Neck supple no adenopathy.  Chest: Clear to auscultation bilaterally.Decreased throughout  CVS: S1, S2 no murmurs, no S3.  ABD: Soft non tender. Bowel sounds normal.  Ext: No edema  MS: Adequate ROM spine, shoulders, hips and reduced in  knees.  Skin: Intact, no ulcerations or rash noted.  Psych: Good eye contact, normal affect. Memory intact not anxious or depressed appearing.  CNS: CN 2-12 intact, power, tone and  sensation normal throughout.       Assessment & Plan:

## 2011-07-21 NOTE — Assessment & Plan Note (Signed)
Improved and controlled on prozac

## 2011-07-21 NOTE — Assessment & Plan Note (Signed)
Pt currently between urologist, previous one has retired, needs to  establish his care a soon as possible

## 2011-07-21 NOTE — Assessment & Plan Note (Signed)
Unchanged, low carb diet discussed and encouraged to lower chance of becoming diabetic

## 2011-07-21 NOTE — Assessment & Plan Note (Signed)
Severe, surgery planned in the future

## 2011-08-05 ENCOUNTER — Ambulatory Visit: Payer: BC Managed Care – PPO | Admitting: Orthopedic Surgery

## 2011-08-12 ENCOUNTER — Ambulatory Visit: Payer: BC Managed Care – PPO | Admitting: Orthopedic Surgery

## 2011-08-24 ENCOUNTER — Institutional Professional Consult (permissible substitution): Payer: BC Managed Care – PPO | Admitting: Pulmonary Disease

## 2011-08-26 ENCOUNTER — Institutional Professional Consult (permissible substitution): Payer: BC Managed Care – PPO | Admitting: Pulmonary Disease

## 2011-08-31 ENCOUNTER — Other Ambulatory Visit: Payer: Self-pay

## 2011-08-31 ENCOUNTER — Telehealth: Payer: Self-pay | Admitting: Family Medicine

## 2011-08-31 MED ORDER — EPINEPHRINE 0.3 MG/0.3ML IJ DEVI: INTRAMUSCULAR | Status: DC

## 2011-08-31 NOTE — Telephone Encounter (Signed)
Med refill sent in as requested.

## 2011-09-04 ENCOUNTER — Telehealth: Payer: Self-pay | Admitting: Family Medicine

## 2011-09-06 ENCOUNTER — Encounter (HOSPITAL_COMMUNITY): Payer: Self-pay | Admitting: *Deleted

## 2011-09-06 ENCOUNTER — Emergency Department (HOSPITAL_COMMUNITY)
Admission: EM | Admit: 2011-09-06 | Discharge: 2011-09-06 | Disposition: A | Discharge status: Home / Self Care | Payer: BC Managed Care – PPO | Attending: Emergency Medicine | Admitting: Emergency Medicine

## 2011-09-06 DIAGNOSIS — F17200 Nicotine dependence, unspecified, uncomplicated: Secondary | ICD-10-CM | POA: Insufficient documentation

## 2011-09-06 DIAGNOSIS — I10 Essential (primary) hypertension: Secondary | ICD-10-CM

## 2011-09-06 DIAGNOSIS — Z8551 Personal history of malignant neoplasm of bladder: Secondary | ICD-10-CM | POA: Insufficient documentation

## 2011-09-06 NOTE — ED Provider Notes (Signed)
History   Scribed for Benny Lennert, MD, the patient was seen in room APA08/APA08 . This chart was scribed by Lewanda Rife.    CSN: [id number]  Arrival date & time 09/06/11  1944   First MD Initiated Contact with Patient 09/06/11 2030      Chief Complaint  Patient presents with  . Hypertension    (Consider location/radiation/quality/duration/timing/severity/associated sxs/prior Treatment) Matthew Adkins is a 61 y.o. male who presents to the Emergency Department complaining of hypertension. Pt states he has not been diagnosed with hypertension in the past, but was feeling light-headed today. Pt states he is checking his blood pressure at home recently and has an appointment with his PCP on Tuesday. Pt is here today concerned about his blood pressure.  Patient is a 61 y.o. male presenting with hypertension. The history is provided by the patient.  Hypertension This is a new problem. The current episode started 12 to 24 hours ago. The problem occurs rarely (waxing and waning). The problem has not changed since onset.Pertinent negatives include no chest pain, no abdominal pain and no headaches. Associated symptoms comments: Light-headedness  . Nothing aggravates the symptoms. Nothing relieves the symptoms. He has tried nothing for the symptoms.    Past Medical History  Diagnosis Date  . Nicotine addiction   . Seasonal allergies   . Bladder cancer 2000    Past Surgical History  Procedure Date  . Athroscopy of right knee 2002  . Foot surgery 2004  . Colonoscopy MJ 2009 pmhX: POLYPS    POLYP REMOVED BUT NOT RETRIEVED  . Colonoscopy LS 2007 ARS    ? POLYPS, no path available  . Cancerous polyps removed from bladder 2000  . Colonoscopy 09/26/2010    Procedure: COLONOSCOPY;  Surgeon: Arlyce Harman, MD;  Location: [facility] SUITE;  Service: Endoscopy;  Laterality: N/A;  1:00    Family History  Problem Relation Age of Onset  . Heart attack Father   . Diabetes Sister   .  Hypertension Sister   . Aneurysm Sister     brain   . Arthritis      family history   . Colon cancer Neg Hx   . Colon polyps Neg Hx     History  Substance Use Topics  . Smoking status: Current Everyday Smoker -- 0.5 packs/day    Types: Cigarettes  . Smokeless tobacco: Not on file  . Alcohol Use: Yes     pint/month      Review of Systems  Constitutional: Negative for fatigue.  HENT: Negative for congestion, sinus pressure and ear discharge.   Eyes: Negative for discharge.  Respiratory: Negative for cough.   Cardiovascular: Negative for chest pain.  Gastrointestinal: Negative for abdominal pain and diarrhea.  Genitourinary: Negative for frequency and hematuria.  Musculoskeletal: Negative for back pain.  Skin: Negative for rash.  Neurological: Positive for light-headedness. Negative for seizures and headaches.  Hematological: Negative.   Psychiatric/Behavioral: Negative for hallucinations.  All other systems reviewed and are negative.    Allergies  Codeine  Home Medications   Current Outpatient Rx  Name Route Sig Dispense Refill  . ASPIRIN 81 MG PO TBEC Oral Take 81 mg by mouth daily.      . BUPROPION HCL ER (SR) 150 MG PO TB12 Oral Take 1 tablet (150 mg total) by mouth 2 (two) times daily. 60 tablet 3  . EPINEPHRINE 0.3 MG/0.3ML IJ DEVI  USE AS DIRECTED IF FEELING ILL AFTER BEE STINGS. 1 Device  3  . FLUTICASONE PROPIONATE 50 MCG/ACT NA SUSP Nasal Place 1 spray into the nose 3 times/day as needed-between meals & bedtime. congestion     . CENTRUM PO Oral Take 1 tablet by mouth daily.     Marland Kitchen PROZAC 10 MG PO CAPS  TAKE 1 CAPSULE BY MOUTH DAILY. 30 each 3  . TAMSULOSIN HCL 0.4 MG PO CAPS Oral Take 0.4 mg by mouth daily after supper.       BP 139/85  Pulse 60  Temp 98.4 F (36.9 C)  Resp 60  Ht 6\' 3"  (1.905 m)  Wt 205 lb (92.987 kg)  BMI 25.62 kg/m2  SpO2 99%  Physical Exam  Nursing note and vitals reviewed. Constitutional: He is oriented to person, place, and  time. He appears well-developed.  HENT:  Head: Normocephalic and atraumatic.  Eyes: Conjunctivae and EOM are normal. No scleral icterus.  Neck: Neck supple. No thyromegaly present.  Cardiovascular: Normal rate and regular rhythm.  Exam reveals no gallop and no friction rub.   No murmur heard. Pulmonary/Chest: No stridor. He has no wheezes. He has no rales. He exhibits no tenderness.  Abdominal: He exhibits no distension. There is no tenderness. There is no rebound.  Musculoskeletal: Normal range of motion. He exhibits no edema.  Lymphadenopathy:    He has no cervical adenopathy.  Neurological: He is oriented to person, place, and time. Coordination normal.  Skin: No rash noted. No erythema.  Psychiatric: He has a normal mood and affect. His behavior is normal.    ED Course  Procedures (including critical care time)  Labs Reviewed - No data to display No results found.   No diagnosis found.    MDM        The chart was scribed for me under my direct supervision.  I personally performed the history, physical, and medical decision making and all procedures in the evaluation of this patient.Benny Lennert, MD 09/06/11 2153

## 2011-09-06 NOTE — ED Notes (Signed)
Pt sts BP was checked at Chiropractor last Monday and BP was 157/86.  Pt sts continued to monitor and staying increased.  Current BP 139/85.

## 2011-09-06 NOTE — ED Notes (Signed)
Pt discharged. Pt stable at time of discharge. pt has no questions regarding discharge at this time. Pt voiced understanding of discharge instructions.  

## 2011-09-08 ENCOUNTER — Encounter: Payer: Self-pay | Admitting: Family Medicine

## 2011-09-08 ENCOUNTER — Ambulatory Visit (INDEPENDENT_AMBULATORY_CARE_PROVIDER_SITE_OTHER): Payer: BC Managed Care – PPO | Admitting: Family Medicine

## 2011-09-08 ENCOUNTER — Other Ambulatory Visit: Payer: Self-pay | Admitting: Family Medicine

## 2011-09-08 VITALS — BP 128/80 | HR 68 | Resp 18 | Ht 75.0 in | Wt 203.1 lb

## 2011-09-08 DIAGNOSIS — E739 Lactose intolerance, unspecified: Secondary | ICD-10-CM

## 2011-09-08 DIAGNOSIS — F329 Major depressive disorder, single episode, unspecified: Secondary | ICD-10-CM

## 2011-09-08 DIAGNOSIS — M5431 Sciatica, right side: Secondary | ICD-10-CM

## 2011-09-08 DIAGNOSIS — E785 Hyperlipidemia, unspecified: Secondary | ICD-10-CM

## 2011-09-08 DIAGNOSIS — F17200 Nicotine dependence, unspecified, uncomplicated: Secondary | ICD-10-CM

## 2011-09-08 DIAGNOSIS — R030 Elevated blood-pressure reading, without diagnosis of hypertension: Secondary | ICD-10-CM | POA: Insufficient documentation

## 2011-09-08 DIAGNOSIS — M5432 Sciatica, left side: Secondary | ICD-10-CM | POA: Insufficient documentation

## 2011-09-08 DIAGNOSIS — M5430 Sciatica, unspecified side: Secondary | ICD-10-CM

## 2011-09-08 MED ORDER — PREDNISONE (PAK) 5 MG PO TABS: 5.0000 mg | ORAL_TABLET | ORAL | Status: DC

## 2011-09-08 MED ORDER — METHYLPREDNISOLONE ACETATE 80 MG/ML IJ SUSP
80.0000 mg | Freq: Once | INTRAMUSCULAR | Status: AC
  Administered 2011-09-08: 80 mg via INTRAMUSCULAR

## 2011-09-08 MED ORDER — CLONIDINE HCL 0.1 MG PO TABS: 0.1000 mg | ORAL_TABLET | Freq: Every day | ORAL | Status: DC

## 2011-09-08 MED ORDER — KETOROLAC TROMETHAMINE 60 MG/2ML IJ SOLN
60.0000 mg | Freq: Once | INTRAMUSCULAR | Status: AC
  Administered 2011-09-08: 60 mg via INTRAMUSCULAR

## 2011-09-08 NOTE — Patient Instructions (Addendum)
F/u early November.  Please plan on quitting nicotine use totally by November. You report smoking an average of 5 cigarettes per day at this time  New medication at night time only for repeated episodes of slightly elevated blood pressure  Toradol and depo medrol in office for sciatic pain down right leg, and prednisone sent in for 6 days  HBA1C and fasting lchem 7 in November before visit.  You are prediabetic, and need to limit carbohydrates and sweets so that you do not become diabetic  Please see Dr Nile Riggs re eye drainage

## 2011-09-08 NOTE — Progress Notes (Signed)
  Subjective:    Patient ID: Matthew Adkins, male    DOB: April 11, 1950, 61 y.o.   MRN: [id number]  HPI Pt in for Ed follow up, was seen in Ed 2 days ago concerned about high blood pressure, he has been told in several offices that his blood pressure is high, and this concerns him.denies chest pain, pND, leg swelling or orthopnea. Currently smoking 10 cigarettes daily, still states he wants to quit , but continues to find this extremely difficult 3 week h/o right posterior leg pain down to toes, and stiffnes in low back , also tingling and numbness in the foot, has had this before Clear drainage from right eye only for the past week, no change in viion, no   Review of Systems See HPI Denies recent fever or chills. Denies sinus pressure, nasal congestion, ear pain or sore throat. Denies chest congestion, productive cough or wheezing. Denies chest pains, palpitations and leg swelling Denies abdominal pain, nausea, vomiting,diarrhea or constipation.   Denies dysuria, frequency, hesitancy or incontinence.  Denies headaches, seizures, numbness, or tingling. Denies depression, anxiety or insomnia. Denies skin break down or rash.        Objective:   Physical Exam Patient alert and oriented and in no cardiopulmonary distress.  HEENT: No facial asymmetry, EOMI, no sinus tenderness,  oropharynx pink and moist.  Neck supple no adenopathy.  Chest: Clear to auscultation bilaterally.Decreased air entry bilaterally  CVS: S1, S2 no murmurs, no S3.  ABD: Soft non tender. Bowel sounds normal.  Ext: No edema  MS: Adequate ROM spine, shoulders, hips and knees.  Skin: Intact, no ulcerations or rash noted.  Psych: Good eye contact, normal affect. Memory intact not anxious or depressed appearing.  CNS: CN 2-12 intact, power, tone and sensation normal throughout.        Assessment & Plan:

## 2011-09-19 NOTE — Assessment & Plan Note (Signed)
Unchanged. Patient counseled for approximately 5 minutes regarding the health risks of ongoing nicotine use, specifically all types of cancer, heart disease, stroke and respiratory failure. The options available for help with cessation ,the behavioral changes to assist the process, and the option to either gradully reduce usage  Or abruptly stop.is also discussed. Pt is also encouraged to set specific goals in number of cigarettes used daily, as well as to set a quit date.  

## 2011-09-19 NOTE — Assessment & Plan Note (Signed)
Repeated office visits with c/o elevated blood pressure. Mildly elevated at visit, will start low dose of medication. DASH diet and commitment to daily physical activity for a minimum of 30 minutes discussed and encouraged, as a part of hypertension management. The importance of attaining a healthy weight is also discussed.

## 2011-09-19 NOTE — Assessment & Plan Note (Signed)
Patient educated about the importance of limiting  Carbohydrate intake , the need to commit to daily physical activity for a minimum of 30 minutes , and to commit weight loss. The fact that changes in all these areas will reduce or eliminate all together the development of diabetes is stressed.    

## 2011-09-19 NOTE — Assessment & Plan Note (Signed)
Uncontrolled pain , anti inflammatory injection at visit

## 2011-09-19 NOTE — Assessment & Plan Note (Deleted)
Improved and controlled on medication 

## 2011-09-19 NOTE — Assessment & Plan Note (Signed)
Elevated LDL, pt counseled re theneed to lower fatty food intake to improve overall health, no meds at this time

## 2011-09-21 ENCOUNTER — Ambulatory Visit (INDEPENDENT_AMBULATORY_CARE_PROVIDER_SITE_OTHER): Payer: BC Managed Care – PPO | Admitting: Pulmonary Disease

## 2011-09-21 ENCOUNTER — Encounter: Payer: Self-pay | Admitting: Pulmonary Disease

## 2011-09-21 VITALS — BP 122/72 | HR 57 | Temp 98.4°F | Ht 75.0 in | Wt 206.0 lb

## 2011-09-21 DIAGNOSIS — R911 Solitary pulmonary nodule: Secondary | ICD-10-CM

## 2011-09-21 NOTE — Progress Notes (Signed)
  Subjective:    Patient ID: Matthew Adkins, male    DOB: June 08, 1950, 61 y.o.   MRN: [id number]  HPI The patient is a 61 year old male who I've been asked to see for a pulmonary nodule.  He has a long history of smoking, and recently underwent a CT chest that showed a 3 mm left upper lobe nodule but no significant lymphadenopathy.  The patient has never lived in [place] or [place], and he has had a negative TB skin test in the past.  He is unsure if he has ever been exposed to active tuberculosis, but thinks his mother may have had this.  He denies any significant cough or mucus production, and has not had hemoptysis.  He has a good appetite, and denies significant weight loss.  He states that his health maintenance is up to date, and his last colonoscopy was a year ago.  He does have a history of limited bladder cancer, and had surgery in 2000 with no evidence for recurrence.   Review of Systems  Constitutional: Negative for fever and unexpected weight change.  HENT: Negative for ear pain, nosebleeds, congestion, sore throat, rhinorrhea, sneezing, trouble swallowing, dental problem, postnasal drip and sinus pressure.   Eyes: Negative for redness and itching.  Respiratory: Negative for cough, chest tightness, shortness of breath and wheezing.   Cardiovascular: Negative for palpitations and leg swelling.  Gastrointestinal: Negative for nausea and vomiting.  Genitourinary: Negative for dysuria.  Musculoskeletal: Negative for joint swelling.  Skin: Negative for rash.  Neurological: Negative for headaches.  Hematological: Does not bruise/bleed easily.  Psychiatric/Behavioral: Negative for dysphoric mood. The patient is not nervous/anxious.        Objective:   Physical Exam Constitutional:  Well developed, no acute distress  HENT:  Nares patent without discharge  Oropharynx without exudate, palate and uvula are normal  Eyes:  Perrla, eomi, no scleral icterus  Neck:  No JVD, no  TMG  Cardiovascular:  Normal rate, regular rhythm, no rubs or gallops.  No murmurs        Intact distal pulses  Pulmonary :  Decreased bs, no stridor or respiratory distress   No rales, rhonchi, or wheezing  Abdominal:  Soft, nondistended, bowel sounds present.  No tenderness noted.   Musculoskeletal:  minimal ankle edema noted.  Lymph Nodes:  No cervical lymphadenopathy noted  Skin:  No cyanosis noted  Neurologic:  Alert, appropriate, moves all 4 extremities without obvious deficit.         Assessment & Plan:

## 2011-09-21 NOTE — Patient Instructions (Addendum)
Work on total smoking cessation.  This is the best way to reduce your risk of lung cancer.  Will schedule for a followup ct chest in one year to followup your lung spot.  We will put in a reminder for ourselves, but you need to call us next July to get xray scheduled.

## 2011-09-21 NOTE — Assessment & Plan Note (Signed)
The patient has a 3 mm left upper lobe nodule on recent CT chest, but no other significant findings.  He has a long history of tobacco abuse, and a remote history of bladder cancer that more than likely was self limiting.  He tells me that his health maintenance is otherwise up to date.  More than likely, this is a benign process, however, given his history, I think he needs followup using Fleischner criteria.  This would require him to have a followup scan in one year.

## 2011-09-29 ENCOUNTER — Encounter: Payer: Self-pay | Admitting: Gastroenterology

## 2011-09-30 ENCOUNTER — Encounter: Payer: Self-pay | Admitting: Gastroenterology

## 2011-09-30 ENCOUNTER — Ambulatory Visit (INDEPENDENT_AMBULATORY_CARE_PROVIDER_SITE_OTHER): Payer: BC Managed Care – PPO | Admitting: Gastroenterology

## 2011-09-30 ENCOUNTER — Encounter: Payer: Self-pay | Admitting: Orthopedic Surgery

## 2011-09-30 ENCOUNTER — Ambulatory Visit: Payer: BC Managed Care – PPO | Admitting: Gastroenterology

## 2011-09-30 ENCOUNTER — Ambulatory Visit (INDEPENDENT_AMBULATORY_CARE_PROVIDER_SITE_OTHER): Payer: BC Managed Care – PPO | Admitting: Orthopedic Surgery

## 2011-09-30 VITALS — BP 120/76 | Ht 75.0 in | Wt 204.0 lb

## 2011-09-30 VITALS — BP 140/85 | HR 61 | Temp 97.6°F | Ht 75.0 in | Wt 204.4 lb

## 2011-09-30 DIAGNOSIS — M5136 Other intervertebral disc degeneration, lumbar region: Secondary | ICD-10-CM | POA: Insufficient documentation

## 2011-09-30 DIAGNOSIS — M51369 Other intervertebral disc degeneration, lumbar region without mention of lumbar back pain or lower extremity pain: Secondary | ICD-10-CM | POA: Insufficient documentation

## 2011-09-30 DIAGNOSIS — K625 Hemorrhage of anus and rectum: Secondary | ICD-10-CM

## 2011-09-30 DIAGNOSIS — M1710 Unilateral primary osteoarthritis, unspecified knee: Secondary | ICD-10-CM | POA: Insufficient documentation

## 2011-09-30 DIAGNOSIS — M5137 Other intervertebral disc degeneration, lumbosacral region: Secondary | ICD-10-CM

## 2011-09-30 DIAGNOSIS — M179 Osteoarthritis of knee, unspecified: Secondary | ICD-10-CM | POA: Insufficient documentation

## 2011-09-30 DIAGNOSIS — K648 Other hemorrhoids: Secondary | ICD-10-CM

## 2011-09-30 NOTE — Progress Notes (Signed)
  Subjective:    Patient ID: Matthew Adkins, male    DOB: 06-03-1950, 61 y.o.   MRN: [id number]  PCP: SIMPSON  HPI LAST TCS SEP 2012: IH. 3-4 times saw fresh blood/old blood. LAST BRBPR: YESTERDAY. No constipation, rectal pain, itching, or pressure. Bms: once a day, no straining. Appetite: good. Smokes 3-4 cigs a day.  Past Medical History  Diagnosis Date  . Nicotine addiction   . Seasonal allergies   . Bladder cancer 2000   Past Surgical History  Procedure Date  . Athroscopy of right knee 2002  . Foot surgery 2004  . Colonoscopy MJ 2009 pmhX: POLYPS    POLYP REMOVED BUT NOT RETRIEVED  . Colonoscopy LS 2007 ARS    ? POLYPS, no path available  . Cancerous polyps removed from bladder 2000  . Colonoscopy 09/26/2010    sessile poylp(2) pan-colnoic diverticulosis,mild/internal hemorrhoids   Allergies  Allergen Reactions  . Bee Venom   . Codeine Nausea And Vomiting   Current Outpatient Prescriptions  Medication Sig Dispense Refill  . aspirin EC 81 MG tablet Take 81 mg by mouth daily.      . cloNIDine (CATAPRES) 0.1 MG tablet Take 1 tablet (0.1 mg total) by mouth at bedtime.    Marland Kitchen EPINEPHrine (EPIPEN) 0.3 mg/0.3 mL DEVI USE AS DIRECTED IF FEELING ILL AFTER BEE STINGS.    Marland Kitchen FLUoxetine (PROZAC) 10 MG capsule     . Multiple Vitamins-Minerals (CENTRUM PO) Take 1 tablet by mouth daily.     . Tamsulosin HCl (FLOMAX) 0.4 MG CAPS Take 0.4 mg by mouth daily after supper.     .           Review of Systems     Objective:   Physical Exam  Vitals reviewed. Constitutional: He is oriented to person, place, and time. He appears well-nourished. No distress.  HENT:  Head: Normocephalic and atraumatic.  Mouth/Throat: Oropharynx is clear and moist. No oropharyngeal exudate.  Eyes: Pupils are equal, round, and reactive to light. No scleral icterus.  Cardiovascular: Normal rate, regular rhythm and normal heart sounds.   Pulmonary/Chest: Effort normal and breath sounds normal. No  respiratory distress.  Abdominal: Soft. Bowel sounds are normal. He exhibits no distension. There is no tenderness.  Musculoskeletal: He exhibits no edema.  Neurological: He is alert and oriented to person, place, and time.       NO FOCAL DEFICITS   Psychiatric: He has a normal mood and affect.          Assessment & Plan:

## 2011-09-30 NOTE — Progress Notes (Signed)
Patient ID: Matthew Adkins, male   DOB: [birth year], 61 y.o.   MRN: [id number] Chief Complaint  Patient presents with  . Knee Pain    bilateral knee pain    Currently out of work pending knee replacement surgery bilaterally  Currently his symptoms are stabilized but he is having some right lower extremity radicular pain. He got an intramuscular shot of cortisone which helped the right leg pain for about a week. He still having some lower back discomfort  He is interested in physical therapy related to that  We will see him in December to get a definitive date on his surgery were knee replacement to be done in January  Diagnosis osteoarthritis bilateral knees  Diagnosis lower back pain with disc disease and radicular pain

## 2011-09-30 NOTE — Progress Notes (Signed)
Reminder in epic to follow up with SF in E30  In 6 months

## 2011-09-30 NOTE — Assessment & Plan Note (Signed)
DUE TO HEMORRHOIDS.  Use PREPARATION H 3 OR 4 TIMES A DAY FOR 5 DAYS. IF BLEEDING HASN'T COMPLETELY RESOLVED, REPEAT FOR ANOTHER 5 DAYS.  CALL FOR RX CREAM IF BLEEDING NOT RESOLVED IN 10 DAYS.  DRINK WATER.  EAT FIBER. AVOID ITEMS THAT CAUSE BLOATING.  FOLLOW UP IN 6 MOS.

## 2011-09-30 NOTE — Assessment & Plan Note (Signed)
SX NOT IDEALLY CONTROLLED.  Use PREPARATION H 3 OR 4 TIMES A DAY FOR 5 DAYS. IF BLEEDING HASN'T COMPLETELY RESOLVED, REPEAT FOR ANOTHER 5 DAYS.  CALL FOR RX CREAM IF BLEEDING NOT RESOLVED IN 10 DAYS.  DRINK WATER.  EAT FIBER. AVOID ITEMS THAT CAUSE BLOATING.  FOLLOW UP IN 6 MOS.

## 2011-09-30 NOTE — Patient Instructions (Addendum)
Use PREPARATION H 3 OR 4 TIMES A DAY FOR 5 DAYS. IF BLEEDING HASN'T COMPLETELY RESOLVED, REPEAT FOR ANOTHER 5 DAYS.  CALL FOR RX CREAM IF BLEEDING NOT RESOLVED IN 10 DAYS.  DRINK WATER.  EAT FIBER. AVOID ITEMS THAT CAUSE BLOATING.  FOLLOW UP IN 6 MOS.  Hemorrhoids Hemorrhoids are dilated (enlarged) veins around the rectum. THEY CAUSE RECTAL BLEEDING. Sometimes clots will form in the veins. This makes them swollen and painful. These are called thrombosed hemorrhoids.  Causes of hemorrhoids include:  Constipation.   Straining to have a bowel movement.   HEAVY LIFTING  HOME CARE INSTRUCTIONS  Eat a well balanced diet and drink 6 to 8 glasses of water every day to avoid constipation. You may also use a bulk laxative.   Avoid straining to have bowel movements.   Keep anal area dry and clean.   Do not use a donut shaped pillow or sit on the toilet for long periods. This increases blood pooling and pain.   Move your bowels when your body has the urge; this will require less straining and will decrease pain and pressure.

## 2011-09-30 NOTE — Progress Notes (Signed)
Faxed to PCP

## 2011-10-07 ENCOUNTER — Ambulatory Visit: Payer: BC Managed Care – PPO | Admitting: Gastroenterology

## 2011-10-07 ENCOUNTER — Ambulatory Visit (HOSPITAL_COMMUNITY): Payer: BC Managed Care – PPO | Attending: Orthopedic Surgery | Admitting: Physical Therapy

## 2011-10-20 ENCOUNTER — Encounter: Payer: Self-pay | Admitting: Orthopedic Surgery

## 2011-10-27 ENCOUNTER — Ambulatory Visit: Payer: BC Managed Care – PPO | Admitting: Family Medicine

## 2011-11-12 ENCOUNTER — Ambulatory Visit: Payer: BC Managed Care – PPO | Admitting: Family Medicine

## 2011-11-12 NOTE — Telephone Encounter (Signed)
II am guessing that the nurse Merry Proud  has took care of this

## 2011-11-17 ENCOUNTER — Telehealth: Payer: Self-pay | Admitting: Orthopedic Surgery

## 2011-11-17 ENCOUNTER — Ambulatory Visit (HOSPITAL_COMMUNITY): Payer: BC Managed Care – PPO | Admitting: Physical Therapy

## 2011-11-17 ENCOUNTER — Other Ambulatory Visit: Payer: Self-pay | Admitting: Orthopedic Surgery

## 2011-11-17 DIAGNOSIS — M5431 Sciatica, right side: Secondary | ICD-10-CM

## 2011-11-17 MED ORDER — HYDROCODONE-ACETAMINOPHEN 5-325 MG PO TABS: 1.0000 | ORAL_TABLET | Freq: Four times a day (QID) | ORAL | Status: DC | PRN

## 2011-11-17 NOTE — Telephone Encounter (Signed)
Matthew Adkins asked if you will call in a prescription for pain medicine to [facility].  He is having pain in his right knee and back, said you told him to call if he needed a prescription His # [phone]

## 2011-11-18 ENCOUNTER — Other Ambulatory Visit: Payer: Self-pay | Admitting: Orthopedic Surgery

## 2011-11-18 DIAGNOSIS — M5431 Sciatica, right side: Secondary | ICD-10-CM

## 2011-11-18 MED ORDER — HYDROCODONE-ACETAMINOPHEN 5-325 MG PO TABS: 1.0000 | ORAL_TABLET | Freq: Four times a day (QID) | ORAL | Status: AC | PRN

## 2011-11-19 ENCOUNTER — Ambulatory Visit (HOSPITAL_COMMUNITY)
Admission: RE | Admit: 2011-11-19 | Discharge: 2011-11-19 | Disposition: A | Discharge status: Home / Self Care | Payer: BC Managed Care – PPO | Source: Ambulatory Visit | Attending: Orthopedic Surgery | Admitting: Orthopedic Surgery

## 2011-11-19 DIAGNOSIS — IMO0001 Reserved for inherently not codable concepts without codable children: Secondary | ICD-10-CM | POA: Insufficient documentation

## 2011-11-19 DIAGNOSIS — M5416 Radiculopathy, lumbar region: Secondary | ICD-10-CM | POA: Insufficient documentation

## 2011-11-19 DIAGNOSIS — M545 Low back pain, unspecified: Secondary | ICD-10-CM | POA: Insufficient documentation

## 2011-11-19 DIAGNOSIS — M5136 Other intervertebral disc degeneration, lumbar region: Secondary | ICD-10-CM | POA: Diagnosis present

## 2011-11-19 NOTE — Evaluation (Signed)
Physical Therapy Evaluation  Patient Details  Name: Matthew Adkins MRN: [id number] Date of Birth: Feb 05, 1950  Today's Date: 11/19/2011 Time: 1310-1345 PT Time Calculation (min): 35 min Chages: 1 eval, 10' Self Care Visit#: 1  of 12   Re-eval: 12/19/11 Assessment Diagnosis: low back pain Next MD Visit: Dr. Romeo Apple  Past Medical History:  Past Medical History  Diagnosis Date  . Nicotine addiction   . Seasonal allergies   . Bladder cancer 2000   Past Surgical History:  Past Surgical History  Procedure Date  . Athroscopy of right knee 2002  . Foot surgery 2004  . Colonoscopy MJ 2009 pmhX: POLYPS    POLYP REMOVED BUT NOT RETRIEVED  . Colonoscopy LS 2007 ARS    ? POLYPS, no path available  . Cancerous polyps removed from bladder 2000  . Colonoscopy 09/26/2010    sessile poylp(2) pan-colnoic diverticulosis,mild/internal hemorrhoids   Subjective Symptoms/Limitations Pertinent History: Pt is referred to PT for low back pain w/R leg radiculopathy into his feet he has been suffering with this for about 3 months and reports insiduous onset.  How long can you sit comfortably?: no more than 30 minutes in a car How long can you stand comfortably?: 15 minutes worst when he is  How long can you walk comfortably?: no to bad Patient Stated Goals: "I want my pain to go away." Pain Assessment Currently in Pain?: Yes Pain Score:   3 (Range: 1-8/10.) Pain Location: Back Pain Type: Acute pain Pain Relieving Factors: standing up and moving around.   Prior Functioning  Prior Function Vocation Requirements: Currently he is not working.  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   Sensation/Coordination/Flexibility/Functional Tests Coordination Gross Motor Movements are Fluid and Coordinated: No Coordination and Movement Description: decreased coordination to multifidus, min cueing for TrA and PF. Functional Tests Functional  Tests: ODI: 60%  Assessment RLE PROM (degrees) RLE Overall PROM Comments: lacking approximatly 10 degress to knee extension RLE Strength Right Hip Flexion: 3+/5 Right Hip Extension: 3+/5 Right Hip ABduction: 3+/5 Right Hip ADduction: 3+/5 Right Knee Flexion: 4/5 Right Knee Extension: 4/5 Palpation Palpation: mild pain and tenderness to low back errector spinae with decrease PA mobility to L4-5 SP  Mobility/Balance  Ambulation/Gait Ambulation/Gait: Yes Gait Pattern: Lateral hip instability Posture/Postural Control Posture/Postural Control: Postural limitations Postural Limitations: 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  After examiniation he is able to perform core contractions w/mod cueing (most needed w/mulifidus due to ridigid low back).  Pt will benefit from skilled therapeutic intervention in order to improve on the following deficits: Decreased coordination;Impaired perceived functional ability;Impaired flexibility;Decreased strength;Decreased range of motion;Improper body mechanics PT Frequency: Min 3X/week PT Duration: 4 weeks    Goals Home Exercise Program Pt will Perform Home Exercise Program: Independently PT Goal: Perform Home Exercise Program - Progress: Goal set today PT Short Term Goals Time to Complete Short Term Goals: 2 weeks PT Short Term Goal 1: Pt will report pain range of 0-5/10.  PT Short Term Goal 2: Pt will improve core coordination and  demonstrate independent TrA, PF and multifidus activiation.  PT Short Term Goal 3: Pt will improve R LE strength by 1 muscle grade.  PT Short Term Goal 4: Pt will improve Hamstring flexibility to 50 degrees in 90/90 position.  PT Long Term Goals Time to Complete Long Term Goals: 4 weeks PT Long Term Goal 1: Pt will report a reduction in radicular symptoms to RLE by 75% and pain less than 3/10 for 50% of his day.  PT Long Term Goal 2: Pt will improve core strenght and coordination in roder to tolerate standing with appropriate posture for an entire therapy regime.  Long Term Goal 3: Pt will improve his ODI to less than 40% for improved QOL.  Problem List Patient Active Problem List  Diagnosis  . BLADDER CANCER  . Prediabetes  . DYSLIPIDEMIA  . NICOTINE ADDICTION  . ALLERGIC RHINITIS  . RECTAL BLEEDING  . SKIN TAG  . DEGENERATIVE JOINT DISEASE, RIGHT KNEE  . KNEE PAIN, RIGHT, ACUTE  . FATIGUE  . Depression  . BRBPR (bright red blood per rectum)  . Insomnia  . Tongue lesion  . OA (osteoarthritis) of knee  . Osteoarthritis, knee  . Elevated blood-pressure reading without diagnosis of hypertension  . Sciatica of right side  . Pulmonary nodule  . Hemorrhoids, internal  . Arthritis of knee, degenerative  . Degenerative disc disease, lumbar  . Lumbago  . Lumbar radiculopathy    PT Plan of Care PT Home Exercise Plan: see scanned report PT Patient Instructions: discussed proper body mechanics when standing, sitting and with standing dynamic movement to decrease lumbar flexion Consulted and Agree with Plan of Care: Patient  Annett Fabian, PT 11/19/2011, 2:56 PM  Physician Documentation Your signature is required to indicate approval of the treatment plan as stated above.  Please sign and either send electronically or make a copy of this report for your files and return this physician signed original.   Please mark one 1.__approve of plan  2. ___approve of plan with the following  conditions.   ______________________________                                                          _____________________ Physician Signature                                                                                                             Date

## 2011-11-24 ENCOUNTER — Ambulatory Visit (HOSPITAL_COMMUNITY)
Admission: RE | Admit: 2011-11-24 | Discharge: 2011-11-24 | Disposition: A | Discharge status: Home / Self Care | Payer: BC Managed Care – PPO | Source: Ambulatory Visit | Attending: Family Medicine | Admitting: Family Medicine

## 2011-11-24 NOTE — Progress Notes (Signed)
Physical Therapy Treatment Patient Details  Name: Matthew Adkins MRN: [id number] Date of Birth: [birth year]  Today's Date: 11/24/2011 Time: 0857-0927 PT Time Calculation (min): 30 min  Visit#: 2  of 12   Re-eval: 12/18/11    Authorization:    Authorization Time Period:    Authorization Visit#:   of     Subjective: Symptoms/Limitations Symptoms: Pt states he has been doing his exercises. Pain Assessment Currently in Pain?: Yes Pain Score:   3 Pain Location: Back Pain Orientation: Right Pain Type: Acute pain    Exercise/Treatments      Stretches Active Hamstring Stretch: 3 reps;30 seconds Single Knee to Chest Stretch: 3 reps;30 seconds   Seated Other Seated Lumbar Exercises: heel roll in/out x 10 Supine Clam: 10 reps Bent Knee Raise: 10 reps Bridge: 10 reps Straight Leg Raise: 10 reps  Physical Therapy Assessment and Plan PT Assessment and Plan Clinical Impression Statement: Pt needed cuing to complete exercises with good stabilization.  Pt tend to lose stab on left side.  PT Plan: begin functional squat; prone ex next treatment.     Goals    Problem List Patient Active Problem List  Diagnosis  . BLADDER CANCER  . Prediabetes  . DYSLIPIDEMIA  . NICOTINE ADDICTION  . ALLERGIC RHINITIS  . RECTAL BLEEDING  . SKIN TAG  . DEGENERATIVE JOINT DISEASE, RIGHT KNEE  . KNEE PAIN, RIGHT, ACUTE  . FATIGUE  . Depression  . BRBPR (bright red blood per rectum)  . Insomnia  . Tongue lesion  . OA (osteoarthritis) of knee  . Osteoarthritis, knee  . Elevated blood-pressure reading without diagnosis of hypertension  . Sciatica of right side  . Pulmonary nodule  . Hemorrhoids, internal  . Arthritis of knee, degenerative  . Degenerative disc disease, lumbar  . Lumbago  . Lumbar radiculopathy    PT - End of Session Activity Tolerance: Patient tolerated treatment well General Behavior During Session: [facility] for tasks performed Cognition: [facility] for tasks  performed  GP    RUSSELL,CINDY 11/24/2011, 9:28 AM

## 2011-12-01 ENCOUNTER — Ambulatory Visit (HOSPITAL_COMMUNITY)
Admission: RE | Admit: 2011-12-01 | Discharge: 2011-12-01 | Disposition: A | Discharge status: Home / Self Care | Payer: BC Managed Care – PPO | Source: Ambulatory Visit | Attending: Family Medicine | Admitting: Family Medicine

## 2011-12-01 NOTE — Progress Notes (Signed)
Physical Therapy Treatment Patient Details  Name: Matthew Adkins MRN: [id number] Date of Birth: 07/27/1950  Today's Date: 12/01/2011 Time: 0852-0930 PT Time Calculation (min): 38 min  Visit#: 3  of 12   Re-eval: 12/18/11  Charge: therex 38'  Subjective: Symptoms/Limitations Symptoms: Pt reported LBP pain scale 3/10, not bad radicular pain this morning until weight bearing. Pain Assessment Currently in Pain?: Yes Pain Score:   3 Pain Location: Back Pain Orientation: Lower;Right  Objective:   Exercise/Treatments Stretches Active Hamstring Stretch: 1 rep;30 seconds Passive Hamstring Stretch: 2 reps;30 seconds;Limitations Passive Hamstring Stretch Limitations: with rope Single Knee to Chest Stretch: 3 reps;30 seconds Standing Functional Squats: 10 reps;3 seconds Supine Clam: 10 reps Bent Knee Raise: 10 reps Bridge: 10 reps Straight Leg Raise: 10 reps Prone  Single Arm Raise: 5 reps;5 seconds Straight Leg Raise: 5 reps;5 seconds Other Prone Lumbar Exercises: Heel squeeze 8x 5"  Physical Therapy Assessment and Plan PT Assessment and Plan Clinical Impression Statement: Added functional squats and prone exercises with min cueing for form and stability, pt able to demonstrate with good control.  Pt reported pain reducand and no c/o of radicular pain through session. PT Plan: Progress therex per PT POC.    Goals    Problem List Patient Active Problem List  Diagnosis  . BLADDER CANCER  . Prediabetes  . DYSLIPIDEMIA  . NICOTINE ADDICTION  . ALLERGIC RHINITIS  . RECTAL BLEEDING  . SKIN TAG  . DEGENERATIVE JOINT DISEASE, RIGHT KNEE  . KNEE PAIN, RIGHT, ACUTE  . FATIGUE  . Depression  . BRBPR (bright red blood per rectum)  . Insomnia  . Tongue lesion  . OA (osteoarthritis) of knee  . Osteoarthritis, knee  . Elevated blood-pressure reading without diagnosis of hypertension  . Sciatica of right side  . Pulmonary nodule  . Hemorrhoids, internal  . Arthritis of  knee, degenerative  . Degenerative disc disease, lumbar  . Lumbago  . Lumbar radiculopathy    PT - End of Session Activity Tolerance: Patient tolerated treatment well General Behavior During Session: [facility] for tasks performed Cognition: [facility] for tasks performed  GP    Juel Burrow 12/01/2011, 9:52 AM

## 2011-12-02 ENCOUNTER — Inpatient Hospital Stay (HOSPITAL_COMMUNITY): Admission: RE | Admit: 2011-12-02 | Payer: BC Managed Care – PPO | Source: Ambulatory Visit

## 2011-12-09 ENCOUNTER — Ambulatory Visit (INDEPENDENT_AMBULATORY_CARE_PROVIDER_SITE_OTHER): Payer: BC Managed Care – PPO | Admitting: Orthopedic Surgery

## 2011-12-09 DIAGNOSIS — M179 Osteoarthritis of knee, unspecified: Secondary | ICD-10-CM

## 2011-12-09 DIAGNOSIS — M1710 Unilateral primary osteoarthritis, unspecified knee: Secondary | ICD-10-CM

## 2011-12-09 MED ORDER — HYDROCODONE-ACETAMINOPHEN 5-325 MG PO TABS: 1.0000 | ORAL_TABLET | ORAL | Status: DC | PRN

## 2011-12-09 NOTE — Patient Instructions (Signed)
activities as tolerated 

## 2011-12-10 ENCOUNTER — Encounter: Payer: Self-pay | Admitting: Orthopedic Surgery

## 2011-12-10 ENCOUNTER — Other Ambulatory Visit (HOSPITAL_COMMUNITY): Payer: Self-pay | Admitting: General Surgery

## 2011-12-10 ENCOUNTER — Ambulatory Visit (HOSPITAL_COMMUNITY)
Admission: RE | Admit: 2011-12-10 | Discharge: 2011-12-10 | Disposition: A | Discharge status: Home / Self Care | Payer: Disability Insurance | Source: Ambulatory Visit | Attending: General Surgery | Admitting: General Surgery

## 2011-12-10 ENCOUNTER — Other Ambulatory Visit: Payer: Self-pay | Admitting: Family Medicine

## 2011-12-10 DIAGNOSIS — M1710 Unilateral primary osteoarthritis, unspecified knee: Secondary | ICD-10-CM | POA: Insufficient documentation

## 2011-12-10 DIAGNOSIS — M25561 Pain in right knee: Secondary | ICD-10-CM

## 2011-12-10 DIAGNOSIS — M549 Dorsalgia, unspecified: Secondary | ICD-10-CM | POA: Insufficient documentation

## 2011-12-10 DIAGNOSIS — IMO0002 Reserved for concepts with insufficient information to code with codable children: Secondary | ICD-10-CM | POA: Insufficient documentation

## 2011-12-10 DIAGNOSIS — M25569 Pain in unspecified knee: Secondary | ICD-10-CM | POA: Insufficient documentation

## 2011-12-10 NOTE — Progress Notes (Signed)
Patient ID: Matthew Adkins, male   DOB: 12-25-1950, 61 y.o.   MRN: [id number] Chief Complaint  Patient presents with  . Follow-up    Osteoarthritis both knees, lumbar disc disease.   1. OA (osteoarthritis) of knee  HYDROcodone-acetaminophen (NORCO/VICODIN) 5-325 MG per tablet    Currently the patient is functioning at a reasonable level now that he is not up on his feet all day. He still has bilateral knee discomfort and lower back pain. He improved after physical therapy for the lower back. His activities of daily living seem to have improved somewhat.  At this point he is not as symptomatic as he was so we decided to wait on any surgery I will follow him a 3 month basis and let him continue with hydrocodone and activity modification before recommending any knee surgery at this time

## 2012-01-11 ENCOUNTER — Encounter: Payer: Self-pay | Admitting: Orthopedic Surgery

## 2012-01-11 ENCOUNTER — Telehealth: Payer: Self-pay | Admitting: Orthopedic Surgery

## 2012-01-11 NOTE — Telephone Encounter (Signed)
Miner Koral wants a new RTW note with anticipated RTW date of 02/05/12.  If approved, fax to [facility] in Benefits at fax # [phone]

## 2012-01-11 NOTE — Telephone Encounter (Signed)
Approved.  

## 2012-01-11 NOTE — Telephone Encounter (Signed)
Note typed and faxed 

## 2012-03-09 ENCOUNTER — Ambulatory Visit (INDEPENDENT_AMBULATORY_CARE_PROVIDER_SITE_OTHER): Payer: BC Managed Care – PPO | Admitting: Orthopedic Surgery

## 2012-03-09 ENCOUNTER — Encounter: Payer: Self-pay | Admitting: Orthopedic Surgery

## 2012-03-09 VITALS — BP 122/58 | Ht 75.0 in | Wt 204.0 lb

## 2012-03-09 MED ORDER — GABAPENTIN 100 MG PO CAPS: ORAL_CAPSULE | ORAL | Status: DC

## 2012-03-09 NOTE — Patient Instructions (Addendum)
Take gabapentin when you get numbness

## 2012-03-09 NOTE — Progress Notes (Signed)
Patient ID: Matthew Adkins, male   DOB: 08-21-1950, 63 y.o.   MRN: [id number] Chief Complaint  Patient presents with  . 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 He also has a degenerative condition his lumbar spine and is complaining of radicular pain in his right leg worse after driving  Review of systems no bowel or bladder dysfunction occasional stiffness right knee  Today we will put him on a new medication gabapentin 100 mg 1-3 tablets as needed for radicular symptoms right leg  Impression established problem stable  Impression established problem worse  Prescription prescribed followup 3 months

## 2012-03-10 ENCOUNTER — Encounter: Payer: Self-pay | Admitting: *Deleted

## 2012-04-27 ENCOUNTER — Telehealth: Payer: Self-pay | Admitting: Orthopedic Surgery

## 2012-04-27 NOTE — Telephone Encounter (Signed)
Matthew Adkins says the Gabapentin 100mg  is not helping his legs.  He takes 3 pills when his leg goes numb, says he has been taking them about all the time  Now and still has the numbness.  If you change medicines he uses [facility] # [phone]

## 2012-04-27 NOTE — Telephone Encounter (Signed)
Tell Matthew Adkins we need to send him for a series of epidural steroid injections and we need to an MRI 1st

## 2012-04-28 ENCOUNTER — Other Ambulatory Visit: Payer: Self-pay | Admitting: *Deleted

## 2012-04-28 DIAGNOSIS — M48061 Spinal stenosis, lumbar region without neurogenic claudication: Secondary | ICD-10-CM

## 2012-04-28 NOTE — Telephone Encounter (Signed)
Ordered MRI 

## 2012-05-02 ENCOUNTER — Telehealth: Payer: Self-pay | Admitting: Radiology

## 2012-05-02 NOTE — Telephone Encounter (Signed)
Patient has MRI at [facility] on 05-04-12 at 10:45. Patient has [employer], authorization [id number] and it expires on 05-31-12. Patient will be scheduled for ESI injections after his MRI results come back.

## 2012-05-04 ENCOUNTER — Ambulatory Visit (HOSPITAL_COMMUNITY)
Admission: RE | Admit: 2012-05-04 | Discharge: 2012-05-04 | Disposition: A | Discharge status: Home / Self Care | Payer: BC Managed Care – PPO | Source: Ambulatory Visit | Attending: Orthopedic Surgery | Admitting: Orthopedic Surgery

## 2012-05-04 DIAGNOSIS — M545 Low back pain, unspecified: Secondary | ICD-10-CM | POA: Insufficient documentation

## 2012-05-04 DIAGNOSIS — M5126 Other intervertebral disc displacement, lumbar region: Secondary | ICD-10-CM | POA: Insufficient documentation

## 2012-05-04 DIAGNOSIS — Z8546 Personal history of malignant neoplasm of prostate: Secondary | ICD-10-CM | POA: Insufficient documentation

## 2012-05-04 DIAGNOSIS — M48061 Spinal stenosis, lumbar region without neurogenic claudication: Secondary | ICD-10-CM

## 2012-05-04 DIAGNOSIS — M79609 Pain in unspecified limb: Secondary | ICD-10-CM | POA: Insufficient documentation

## 2012-05-16 ENCOUNTER — Telehealth: Payer: Self-pay | Admitting: Orthopedic Surgery

## 2012-05-16 NOTE — Telephone Encounter (Signed)
Matthew Adkins asked if you will call him with his MRI results.  His # [phone]

## 2012-05-18 ENCOUNTER — Other Ambulatory Visit: Payer: Self-pay | Admitting: Orthopedic Surgery

## 2012-05-18 DIAGNOSIS — M48061 Spinal stenosis, lumbar region without neurogenic claudication: Secondary | ICD-10-CM

## 2012-05-18 NOTE — Progress Notes (Signed)
Results given.

## 2012-06-07 ENCOUNTER — Ambulatory Visit (HOSPITAL_COMMUNITY): Payer: BC Managed Care – PPO | Admitting: Physical Therapy

## 2012-06-09 ENCOUNTER — Ambulatory Visit (INDEPENDENT_AMBULATORY_CARE_PROVIDER_SITE_OTHER): Payer: BC Managed Care – PPO | Admitting: Orthopedic Surgery

## 2012-06-09 VITALS — BP 106/60 | Ht 75.0 in | Wt 204.0 lb

## 2012-06-09 DIAGNOSIS — IMO0002 Reserved for concepts with insufficient information to code with codable children: Secondary | ICD-10-CM

## 2012-06-09 DIAGNOSIS — M5416 Radiculopathy, lumbar region: Secondary | ICD-10-CM

## 2012-06-09 DIAGNOSIS — M1710 Unilateral primary osteoarthritis, unspecified knee: Secondary | ICD-10-CM

## 2012-06-09 NOTE — Patient Instructions (Signed)
Call when ready to schedule surgery

## 2012-06-10 ENCOUNTER — Encounter: Payer: Self-pay | Admitting: Orthopedic Surgery

## 2012-06-10 NOTE — Progress Notes (Signed)
Patient ID: Matthew Adkins, male   DOB: 1950-06-06, 62 y.o.   MRN: [id number] Chief Complaint  Patient presents with  . Follow-up    3 month recheck knee, back response to medicine   BP 106/60  Ht 6\' 3"  (1.905 m)  Wt 204 lb (92.534 kg)  BMI 25.5 kg/m2  Encounter Diagnoses  Name Primary?  Marland Kitchen Arthritis of knee, degenerative Yes  . Lumbar radiculopathy     Followup status post MRI for radiculopathy in the right lower extremity, followup bilateral degenerative joint disease both knees.  The patient is on Neurontin. He is doing well on Neurontin with only occasional radicular symptoms exacerbated by sitting in a car riding for 1/2-2 hours at a time otherwise he is primarily symptom free. His pain is well-controlled Norco 5 mg in terms of his knees. He does have some trouble getting up from a low chair.  Review of systems neurologic there is no bowel or bladder dysfunction.  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 He will call us when he is ready.

## 2012-07-06 ENCOUNTER — Ambulatory Visit (HOSPITAL_COMMUNITY)
Admission: RE | Admit: 2012-07-06 | Discharge: 2012-07-06 | Disposition: A | Discharge status: Home / Self Care | Payer: BC Managed Care – PPO | Source: Ambulatory Visit | Attending: Pulmonary Disease | Admitting: Pulmonary Disease

## 2012-07-06 ENCOUNTER — Other Ambulatory Visit: Payer: BC Managed Care – PPO

## 2012-07-06 DIAGNOSIS — R911 Solitary pulmonary nodule: Secondary | ICD-10-CM | POA: Insufficient documentation

## 2012-07-06 DIAGNOSIS — Z09 Encounter for follow-up examination after completed treatment for conditions other than malignant neoplasm: Secondary | ICD-10-CM | POA: Insufficient documentation

## 2012-07-18 ENCOUNTER — Encounter: Payer: Self-pay | Admitting: Pulmonary Disease

## 2012-07-18 ENCOUNTER — Ambulatory Visit (INDEPENDENT_AMBULATORY_CARE_PROVIDER_SITE_OTHER): Payer: BC Managed Care – PPO | Admitting: Pulmonary Disease

## 2012-07-18 VITALS — BP 138/86 | HR 56 | Temp 97.5°F | Ht 75.0 in | Wt 198.2 lb

## 2012-07-18 DIAGNOSIS — R911 Solitary pulmonary nodule: Secondary | ICD-10-CM

## 2012-07-18 NOTE — Progress Notes (Signed)
  Subjective:    Patient ID: Matthew Adkins, male    DOB: 07-Nov-1950, 62 y.o.   MRN: [id number]  HPI No visit, scheduled   Review of Systems  Constitutional: Negative for fever and unexpected weight change.  HENT: Negative for ear pain, nosebleeds, congestion, sore throat, rhinorrhea, sneezing, trouble swallowing, dental problem, postnasal drip and sinus pressure.   Eyes: Negative for redness and itching.  Respiratory: Negative for cough, chest tightness, shortness of breath and wheezing.   Cardiovascular: Negative for palpitations and leg swelling.  Gastrointestinal: Negative for nausea and vomiting.  Genitourinary: Negative for dysuria.  Musculoskeletal: Negative for joint swelling.  Skin: Negative for rash.  Neurological: Negative for headaches.  Hematological: Does not bruise/bleed easily.  Psychiatric/Behavioral: Negative for dysphoric mood. The patient is not nervous/anxious.        Objective:   Physical Exam        Assessment & Plan:

## 2012-07-18 NOTE — Patient Instructions (Signed)
No further f/u ct chest needed.

## 2012-07-25 ENCOUNTER — Other Ambulatory Visit: Payer: Self-pay | Admitting: *Deleted

## 2012-07-25 DIAGNOSIS — M1710 Unilateral primary osteoarthritis, unspecified knee: Secondary | ICD-10-CM

## 2012-07-25 MED ORDER — HYDROCODONE-ACETAMINOPHEN 5-325 MG PO TABS: 1.0000 | ORAL_TABLET | ORAL | Status: DC | PRN

## 2012-09-15 ENCOUNTER — Telehealth: Payer: Self-pay | Admitting: Orthopedic Surgery

## 2012-09-15 NOTE — Telephone Encounter (Signed)
Patient called and stopped in, asking if Dr Romeo Apple can see/treat right leg pain, which is radiating down right leg.  He does not h ave a regular follow up appointment scheduled, as surgery was discussed at last visit - note indicates:  "Patient Instructions"    "Call when ready to schedule surgery"      Please advise patient, ph# [phone].

## 2012-09-15 NOTE — Telephone Encounter (Signed)
Schedule him for 3:00 Monday afternoon and also should have another patient, and at 3:00 Monday afternoon please place him on the schedule as requested

## 2012-09-15 NOTE — Telephone Encounter (Signed)
Routing to Dr Harrison 

## 2012-09-15 NOTE — Telephone Encounter (Signed)
Informed patient of appointment with Dr. Romeo Apple Monday at 3:00 pm

## 2012-09-19 ENCOUNTER — Ambulatory Visit: Payer: BC Managed Care – PPO | Admitting: Orthopedic Surgery

## 2012-09-20 ENCOUNTER — Ambulatory Visit (INDEPENDENT_AMBULATORY_CARE_PROVIDER_SITE_OTHER): Payer: BC Managed Care – PPO | Admitting: Orthopedic Surgery

## 2012-09-20 ENCOUNTER — Encounter: Payer: Self-pay | Admitting: Orthopedic Surgery

## 2012-09-20 VITALS — BP 116/69 | Ht 75.0 in | Wt 204.0 lb

## 2012-09-20 DIAGNOSIS — M5410 Radiculopathy, site unspecified: Secondary | ICD-10-CM | POA: Insufficient documentation

## 2012-09-20 DIAGNOSIS — M5137 Other intervertebral disc degeneration, lumbosacral region: Secondary | ICD-10-CM

## 2012-09-20 DIAGNOSIS — M5136 Other intervertebral disc degeneration, lumbar region: Secondary | ICD-10-CM

## 2012-09-20 DIAGNOSIS — IMO0002 Reserved for concepts with insufficient information to code with codable children: Secondary | ICD-10-CM

## 2012-09-20 NOTE — Progress Notes (Signed)
Patient ID: Matthew Adkins, male   DOB: 11-01-1950, 62 y.o.   MRN: [id number]  Chief Complaint  Patient presents with  . Knee Pain    Right knee pain radiating down leg    The patient comes in with a history of degenerative disc disease osteoarthritis of both knees but on this occasion complains of pain running down the back of his leg into his foot associated with numbness and tingling but no weakness the symptoms started approximately 2-3 weeks ago and became constant last week. Although he did not have weakness in the leg he noticed that when he laid in the fetal position at night it was difficult for him to put weight on his right lower extremity. His symptoms are worse with driving but did not affect his bowel or bladder  As noted genitourinary symptoms are negative numbness and tingling without gait disturbance gastrointestinal constipation not an issue loose stool not an issue  Vital signs are stable as recorded appearance is normal is oriented x3 his mood is normal he ambulates with no assistive device and no evidence of a left his right and left leg have normal sensation and reflexes are 0-1+ knee and ankle bilaterally bilateral distal pulses are intact skin over the right leg is normal strength is normal stability of the knee hip and ankle are normal in range of motion is preserved no tenderness is noted in the knee joint but there is pain and tenderness in the posterior thigh  I reviewed his MRI again  He has a history of degenerative disc disease with the following MRI findings L3-4: Bulge slightly greater right lateral position with associate endplate degenerative changes.  This approaches but does not compress the exiting right L3 nerve root.  Facet joint degenerative changes.  Ligamentum flavum hypertrophy.  Dorsal epidural fat. Mild to slightly moderate thecal sac narrowing.   L4-5:  Moderate bulge/broad-based protrusion most notable centrally with central annular tear.   Ligamentum flavum hypertrophy and facet joint degenerative changes.  Moderate bilateral lateral recess stenosis greater on the left.  Mild to moderate spinal stenosis.   Recommend continue gabapentin 100 mg 3 times a day And epidural steroid injection series with a followup scheduled for 6 weeks

## 2012-09-20 NOTE — Patient Instructions (Addendum)
L4-5 ESI SERIES    Epidural Steroid Injection An epidural steroid injection is given to relieve pain in the neck, back, or legs. This procedure involves injecting a steroid and numbing medicine (local anesthetic) into the epidural space. The epidural space is the space between the outer covering of the spinal cord and the vertebra. The epidural steroid injection helps in reducing the pain that is caused by the irritation or swelling of the nerve root. However, it does not cure the underlying problem. The injection may be given for the following conditions:  Changes in the soft, gel-like cushion between two vertebrae (disk) due to wear and tear.  A reduction in the space within the spinal canal.  Slipped or herniated disk.  Low back (lumbar) sprain.  Sciatica. This is shooting pain that radiates down the buttocks and the back of the leg due to compression of the nerve.  Traumatic compression fracture of the vertebra.  Pain that develops after a surgery of the spine.  Pain that arises after an attack of viral infection affecting the nerves (shingles). LET YOUR CAREGIVER KNOW ABOUT:   Allergies to food or medicine.  Medicines taken, including vitamins, herbs, eyedrops, over-the-counter medicines, and creams.  Use of steroids (by mouth or creams).  Previous problems with anesthetics or numbing medicines.  History of bleeding problems or blood clots.  Previous surgery.  Other health problems, including diabetes and kidney problems.  Possibility of pregnancy, if this applies.     RISKS AND COMPLICATIONS The complications due to the needle insertion are:  Headache.  Bleeding.  Infection.  Allergic reaction to the medicines.  Damage to the nerves. The complications due to the steroid are:  Weight gain.  Hot flashes.  Mood swings.  Lack of sleep.  Increase in blood sugar levels, especially if you are diabetic.  Retention of water. The response to this  procedure depends on the underlying cause of the pain and its duration. Patients who have long-term (chronic) pain are less likely to benefit from epidural steroids than are patients whose pain comes on strong and suddenly. BEFORE THE PROCEDURE   The caregiver may ask about your symptoms, do a detailed exam, and advise some tests. These tests may include imaging studies. Your caregiver may review the results of your tests and discuss the procedure with you.  Ask your caregiver about changing or stopping your regular medicines. You may be advised to stop taking blood-thinning medicines a few days before the procedure.  You may also be given medicines to reduce your anxiety. PROCEDURE  You will remain awake during the whole procedure. Although, you may receive medicine to make you sleepy. You will be asked to lie on your stomach. The site of the injection is cleansed. Then, the injection site is numbed using a small amount of medicine that numbs the area (local anesthetic). A hollow needle is directed through your skin into the epidural space with the help of an X-ray. The X-ray helps to ensure that the steroid is delivered closest to the affected nerve. You may have some minimal discomfort at this time. Once the needle is in the right position, the local anesthetic and the steroid are injected into the epidural space. The needle is then removed. The skin is cleaned and a bandage is applied. The entire procedure takes only a few minutes, although repeated injections may be required (up to 3 to 4 injections over several weeks).  AFTER THE PROCEDURE   You may be monitored for a  short time before you go home.  You may feel weakness or numbness in your arm or leg, which disappears within 1 to 2 hours.  Someone must drive you home or accompany you home if you are taking a taxi.  You may be allowed to eat, drink, and take your regular medicine.  Your pain may improve or worsen right after the  procedure.  You may feel the beneficial effect of the steroid a few days later.  You may have soreness at the site of the injection.  If you have only partial relief of the pain, the injection may be repeated once or even twice within 4 to 8 weeks of the initial injection. Document Released: 03/31/2007 Document Revised: 03/16/2011 Document Reviewed: 05/03/2008 [facility] Patient Information 2014 [place], [place].  MRI SHOWED L3-4: Bulge slightly greater right lateral position with associate  endplate degenerative changes. This approaches but does not  compress the exiting right L3 nerve root. Facet joint degenerative  changes. Ligamentum flavum hypertrophy. Dorsal epidural fat.  Mild to slightly moderate thecal sac narrowing.  L4-5: Moderate bulge/broad-based protrusion most notable centrally  with central annular tear. Ligamentum flavum hypertrophy and facet  joint degenerative changes. Moderate bilateral lateral recess  stenosis greater on the left. Mild to moderate spinal stenosis.

## 2012-09-21 ENCOUNTER — Other Ambulatory Visit: Payer: Self-pay | Admitting: *Deleted

## 2012-09-21 DIAGNOSIS — M48061 Spinal stenosis, lumbar region without neurogenic claudication: Secondary | ICD-10-CM

## 2012-09-28 ENCOUNTER — Ambulatory Visit
Admission: RE | Admit: 2012-09-28 | Discharge: 2012-09-28 | Disposition: A | Discharge status: Home / Self Care | Payer: BC Managed Care – PPO | Source: Ambulatory Visit | Attending: Orthopedic Surgery | Admitting: Orthopedic Surgery

## 2012-09-28 VITALS — BP 150/91 | HR 51

## 2012-09-28 DIAGNOSIS — M5416 Radiculopathy, lumbar region: Secondary | ICD-10-CM

## 2012-09-28 DIAGNOSIS — M5410 Radiculopathy, site unspecified: Secondary | ICD-10-CM

## 2012-09-28 DIAGNOSIS — M48061 Spinal stenosis, lumbar region without neurogenic claudication: Secondary | ICD-10-CM

## 2012-09-28 MED ORDER — METHYLPREDNISOLONE ACETATE 40 MG/ML INJ SUSP (RADIOLOG
120.0000 mg | Freq: Once | INTRAMUSCULAR | Status: AC
  Administered 2012-09-28: 120 mg via EPIDURAL

## 2012-09-28 MED ORDER — IOHEXOL 180 MG/ML  SOLN
1.0000 mL | Freq: Once | INTRAMUSCULAR | Status: AC | PRN
  Administered 2012-09-28: 1 mL via EPIDURAL

## 2012-11-03 ENCOUNTER — Ambulatory Visit: Payer: BC Managed Care – PPO | Admitting: Orthopedic Surgery

## 2012-11-03 ENCOUNTER — Encounter: Payer: Self-pay | Admitting: Orthopedic Surgery

## 2012-11-16 ENCOUNTER — Telehealth: Payer: Self-pay | Admitting: Orthopedic Surgery

## 2012-11-16 NOTE — Telephone Encounter (Signed)
Matthew Adkins wants to get another ESI.  Does he need a new order?

## 2012-11-16 NOTE — Telephone Encounter (Signed)
Faxed order for second ESI to [facility]

## 2012-11-17 ENCOUNTER — Other Ambulatory Visit: Payer: Self-pay | Admitting: Orthopedic Surgery

## 2012-11-17 DIAGNOSIS — M48061 Spinal stenosis, lumbar region without neurogenic claudication: Secondary | ICD-10-CM

## 2012-11-22 ENCOUNTER — Ambulatory Visit
Admission: RE | Admit: 2012-11-22 | Discharge: 2012-11-22 | Disposition: A | Discharge status: Home / Self Care | Payer: BC Managed Care – PPO | Source: Ambulatory Visit | Attending: Orthopedic Surgery | Admitting: Orthopedic Surgery

## 2012-11-22 DIAGNOSIS — M48061 Spinal stenosis, lumbar region without neurogenic claudication: Secondary | ICD-10-CM

## 2012-11-22 MED ORDER — METHYLPREDNISOLONE ACETATE 40 MG/ML INJ SUSP (RADIOLOG
120.0000 mg | Freq: Once | INTRAMUSCULAR | Status: AC
  Administered 2012-11-22: 120 mg via EPIDURAL

## 2012-11-22 MED ORDER — IOHEXOL 180 MG/ML  SOLN
1.0000 mL | Freq: Once | INTRAMUSCULAR | Status: AC | PRN
  Administered 2012-11-22: 1 mL via EPIDURAL

## 2013-01-13 ENCOUNTER — Other Ambulatory Visit: Payer: Self-pay | Admitting: Family Medicine

## 2013-01-31 ENCOUNTER — Encounter: Payer: Self-pay | Admitting: Family Medicine

## 2013-01-31 ENCOUNTER — Ambulatory Visit (INDEPENDENT_AMBULATORY_CARE_PROVIDER_SITE_OTHER): Payer: BC Managed Care – PPO | Admitting: Family Medicine

## 2013-01-31 ENCOUNTER — Encounter (INDEPENDENT_AMBULATORY_CARE_PROVIDER_SITE_OTHER): Payer: Self-pay

## 2013-01-31 VITALS — BP 120/70 | HR 69 | Temp 99.3°F | Resp 16 | Ht 75.0 in | Wt 191.0 lb

## 2013-01-31 DIAGNOSIS — F32A Depression, unspecified: Secondary | ICD-10-CM

## 2013-01-31 DIAGNOSIS — E785 Hyperlipidemia, unspecified: Secondary | ICD-10-CM

## 2013-01-31 DIAGNOSIS — F17200 Nicotine dependence, unspecified, uncomplicated: Secondary | ICD-10-CM

## 2013-01-31 DIAGNOSIS — C679 Malignant neoplasm of bladder, unspecified: Secondary | ICD-10-CM

## 2013-01-31 DIAGNOSIS — M179 Osteoarthritis of knee, unspecified: Secondary | ICD-10-CM

## 2013-01-31 DIAGNOSIS — R7303 Prediabetes: Secondary | ICD-10-CM

## 2013-01-31 DIAGNOSIS — F329 Major depressive disorder, single episode, unspecified: Secondary | ICD-10-CM

## 2013-01-31 DIAGNOSIS — B9689 Other specified bacterial agents as the cause of diseases classified elsewhere: Secondary | ICD-10-CM

## 2013-01-31 DIAGNOSIS — R7309 Other abnormal glucose: Secondary | ICD-10-CM

## 2013-01-31 DIAGNOSIS — J208 Acute bronchitis due to other specified organisms: Principal | ICD-10-CM

## 2013-01-31 DIAGNOSIS — M1710 Unilateral primary osteoarthritis, unspecified knee: Secondary | ICD-10-CM

## 2013-01-31 DIAGNOSIS — IMO0002 Reserved for concepts with insufficient information to code with codable children: Secondary | ICD-10-CM

## 2013-01-31 DIAGNOSIS — J209 Acute bronchitis, unspecified: Secondary | ICD-10-CM

## 2013-01-31 DIAGNOSIS — R5383 Other fatigue: Secondary | ICD-10-CM

## 2013-01-31 DIAGNOSIS — A499 Bacterial infection, unspecified: Secondary | ICD-10-CM

## 2013-01-31 DIAGNOSIS — F3289 Other specified depressive episodes: Secondary | ICD-10-CM

## 2013-01-31 DIAGNOSIS — R5381 Other malaise: Secondary | ICD-10-CM

## 2013-01-31 LAB — LIPID PANEL
Cholesterol: 189 mg/dL (ref 0–200)
HDL: 58 mg/dL (ref >39)
LDL Cholesterol: 119 mg/dL — ABNORMAL HIGH (ref 0–99)
Total CHOL/HDL Ratio: 3.3 Ratio
Triglycerides: 60 mg/dL (ref <150)
VLDL: 12 mg/dL (ref 0–40)

## 2013-01-31 LAB — BASIC METABOLIC PANEL
BUN: 13 mg/dL (ref 6–23)
CO2: 28 mEq/L (ref 19–32)
CREATININE: 0.91 mg/dL (ref 0.50–1.35)
Calcium: 9.2 mg/dL (ref 8.4–10.5)
Chloride: 103 mEq/L (ref 96–112)
GLUCOSE: 114 mg/dL — AB (ref 70–99)
Potassium: 4.2 mEq/L (ref 3.5–5.3)
Sodium: 141 mEq/L (ref 135–145)

## 2013-01-31 LAB — HEMOGLOBIN A1C
HEMOGLOBIN A1C: 6 % — AB (ref <5.7)
MEAN PLASMA GLUCOSE: 126 mg/dL — AB (ref <117)

## 2013-01-31 LAB — TSH: TSH: 0.865 u[IU]/mL (ref 0.350–4.500)

## 2013-01-31 MED ORDER — PROMETHAZINE-DM 6.25-15 MG/5ML PO SYRP: ORAL_SOLUTION | ORAL | Status: DC

## 2013-01-31 MED ORDER — BENZONATATE 100 MG PO CAPS: 100.0000 mg | ORAL_CAPSULE | Freq: Three times a day (TID) | ORAL | Status: DC | PRN

## 2013-01-31 MED ORDER — CEFTRIAXONE SODIUM 500 MG IJ SOLR
500.0000 mg | Freq: Once | INTRAMUSCULAR | Status: AC
  Administered 2013-01-31: 500 mg via INTRAMUSCULAR

## 2013-01-31 MED ORDER — FLUOXETINE HCL (PMDD) 10 MG PO CAPS: 1.0000 | ORAL_CAPSULE | Freq: Every day | ORAL | Status: DC

## 2013-01-31 MED ORDER — LEVOFLOXACIN 500 MG PO TABS: 500.0000 mg | ORAL_TABLET | Freq: Every day | ORAL | Status: DC

## 2013-01-31 NOTE — Patient Instructions (Addendum)
F/u in early May, with rectal, call if you need before  New for depression is fluoxetine    You are being treated for acute bacterial bronchitis, rocephin in office and medication is sent in (3)   Labs today, HBA1C, chem 74m lipid, TSH and HBa1c    Please work on smoking cesstion you need to quit.  You are referred for a chest CT scan

## 2013-02-01 ENCOUNTER — Encounter (HOSPITAL_COMMUNITY): Payer: Self-pay | Admitting: Emergency Medicine

## 2013-02-01 ENCOUNTER — Emergency Department (HOSPITAL_COMMUNITY)
Admission: EM | Admit: 2013-02-01 | Discharge: 2013-02-02 | Disposition: A | Discharge status: Home / Self Care | Payer: BC Managed Care – PPO | Attending: Emergency Medicine | Admitting: Emergency Medicine

## 2013-02-01 DIAGNOSIS — F17200 Nicotine dependence, unspecified, uncomplicated: Secondary | ICD-10-CM | POA: Insufficient documentation

## 2013-02-01 DIAGNOSIS — R52 Pain, unspecified: Secondary | ICD-10-CM | POA: Insufficient documentation

## 2013-02-01 DIAGNOSIS — Z8709 Personal history of other diseases of the respiratory system: Secondary | ICD-10-CM | POA: Insufficient documentation

## 2013-02-01 DIAGNOSIS — J209 Acute bronchitis, unspecified: Secondary | ICD-10-CM | POA: Insufficient documentation

## 2013-02-01 DIAGNOSIS — Z7982 Long term (current) use of aspirin: Secondary | ICD-10-CM | POA: Insufficient documentation

## 2013-02-01 DIAGNOSIS — Z79899 Other long term (current) drug therapy: Secondary | ICD-10-CM | POA: Insufficient documentation

## 2013-02-01 DIAGNOSIS — Z8551 Personal history of malignant neoplasm of bladder: Secondary | ICD-10-CM | POA: Insufficient documentation

## 2013-02-01 DIAGNOSIS — Z8659 Personal history of other mental and behavioral disorders: Secondary | ICD-10-CM | POA: Insufficient documentation

## 2013-02-01 DIAGNOSIS — Z792 Long term (current) use of antibiotics: Secondary | ICD-10-CM | POA: Insufficient documentation

## 2013-02-01 NOTE — ED Notes (Signed)
PT C/O COUGH, CONGESTION, AND HEADACHE X 3DAYS.

## 2013-02-02 ENCOUNTER — Emergency Department (HOSPITAL_COMMUNITY): Payer: BC Managed Care – PPO

## 2013-02-02 MED ORDER — ALBUTEROL SULFATE HFA 108 (90 BASE) MCG/ACT IN AERS
2.0000 | INHALATION_SPRAY | RESPIRATORY_TRACT | Status: DC | PRN
  Filled 2013-02-02: qty 6.7

## 2013-02-02 MED ORDER — PREDNISONE 50 MG PO TABS
60.0000 mg | ORAL_TABLET | Freq: Once | ORAL | Status: AC
  Administered 2013-02-02: 60 mg via ORAL
  Filled 2013-02-02 (×2): qty 1

## 2013-02-02 MED ORDER — ACETAMINOPHEN 500 MG PO TABS
1000.0000 mg | ORAL_TABLET | Freq: Once | ORAL | Status: AC
  Administered 2013-02-02: 1000 mg via ORAL
  Filled 2013-02-02: qty 2

## 2013-02-02 MED ORDER — IPRATROPIUM-ALBUTEROL 0.5-2.5 (3) MG/3ML IN SOLN
3.0000 mL | Freq: Once | RESPIRATORY_TRACT | Status: AC
  Administered 2013-02-02: 3 mL via RESPIRATORY_TRACT
  Filled 2013-02-02: qty 3

## 2013-02-02 MED ORDER — PREDNISONE 50 MG PO TABS: 50.0000 mg | ORAL_TABLET | Freq: Every day | ORAL | Status: DC

## 2013-02-02 NOTE — ED Notes (Signed)
Discharge instructions given and reviewed with patient.  Prescription given for Prednisone; effects and use explained.  Patient verbalized understanding to take medication as directed and to continue taking his antibiotic he received from [facility] yesterday.  Patient ambulatory; discharged home in good condition.

## 2013-02-02 NOTE — Discharge Instructions (Signed)
Continue taking your antibiotic until it is all gone.\   Acute Bronchitis Bronchitis is inflammation of the airways that extend from the windpipe into the lungs (bronchi). The inflammation often causes mucus to develop. This leads to a cough, which is the most common symptom of bronchitis.  In acute bronchitis, the condition usually develops suddenly and goes away over time, usually in a couple weeks. Smoking, allergies, and asthma can make bronchitis worse. Repeated episodes of bronchitis may cause further lung problems.  CAUSES Acute bronchitis is most often caused by the same virus that causes a cold. The virus can spread from person to person (contagious).  SIGNS AND SYMPTOMS   Cough.   Fever.   Coughing up mucus.   Body aches.   Chest congestion.   Chills.   Shortness of breath.   Sore throat.  DIAGNOSIS  Acute bronchitis is usually diagnosed through a physical exam. Tests, such as chest X-rays, are sometimes done to rule out other conditions.  TREATMENT  Acute bronchitis usually goes away in a couple weeks. Often times, no medical treatment is necessary. Medicines are sometimes given for relief of fever or cough. Antibiotics are usually not needed but may be prescribed in certain situations. In some cases, an inhaler may be recommended to help reduce shortness of breath and control the cough. A cool mist vaporizer may also be used to help thin bronchial secretions and make it easier to clear the chest.  HOME CARE INSTRUCTIONS  Get plenty of rest.   Drink enough fluids to keep your urine clear or pale yellow (unless you have a medical condition that requires fluid restriction). Increasing fluids may help thin your secretions and will prevent dehydration.   Only take over-the-counter or prescription medicines as directed by your health care provider.   Avoid smoking and secondhand smoke. Exposure to cigarette smoke or irritating chemicals will make bronchitis  worse. If you are a smoker, consider using nicotine gum or skin patches to help control withdrawal symptoms. Quitting smoking will help your lungs heal faster.   Reduce the chances of another bout of acute bronchitis by washing your hands frequently, avoiding people with cold symptoms, and trying not to touch your hands to your mouth, nose, or eyes.   Follow up with your health care provider as directed.  SEEK MEDICAL CARE IF: Your symptoms do not improve after 1 week of treatment.  SEEK IMMEDIATE MEDICAL CARE IF:  You develop an increased fever or chills.   You have chest pain.   You have severe shortness of breath.  You have bloody sputum.   You develop dehydration.  You develop fainting.  You develop repeated vomiting.  You develop a severe headache. MAKE SURE YOU:   Understand these instructions.  Will watch your condition.  Will get help right away if you are not doing well or get worse. Document Released: 01/30/2004 Document Revised: 08/24/2012 Document Reviewed: 06/14/2012 [facility] Patient Information 2014 [place].  Albuterol inhalation aerosol What is this medicine? ALBUTEROL (al [place]) is a bronchodilator. It helps open up the airways in your lungs to make it easier to breathe. This medicine is used to treat and to prevent bronchospasm. This medicine may be used for other purposes; ask your health care provider or pharmacist if you have questions. COMMON BRAND NAME(S): Proair HFA, Proventil HFA, Proventil, Respirol , Ventolin HFA, Ventolin What should I tell my health care provider before I take this medicine? They need to know if you have  any of the following conditions: -diabetes -heart disease or irregular heartbeat -high blood pressure -pheochromocytoma -seizures -thyroid disease -an unusual or allergic reaction to albuterol, levalbuterol, sulfites, other medicines, foods, dyes, or preservatives -pregnant or trying to get  pregnant -breast-feeding How should I use this medicine? This medicine is for inhalation through the mouth. Follow the directions on your prescription label. Take your medicine at regular intervals. Do not use more often than directed. Make sure that you are using your inhaler correctly. Ask you doctor or health care provider if you have any questions. Talk to your pediatrician regarding the use of this medicine in children. Special care may be needed. Overdosage: If you think you have taken too much of this medicine contact a poison control center or emergency room at once. NOTE: This medicine is only for you. Do not share this medicine with others. What if I miss a dose? If you miss a dose, use it as soon as you can. If it is almost time for your next dose, use only that dose. Do not use double or extra doses. What may interact with this medicine? -anti-infectives like chloroquine and pentamidine -caffeine -cisapride -diuretics -medicines for colds -medicines for depression or for emotional or psychotic conditions -medicines for weight loss including some herbal products -methadone -some antibiotics like clarithromycin, erythromycin, levofloxacin, and linezolid -some heart medicines -steroid hormones like dexamethasone, cortisone, hydrocortisone -theophylline -thyroid hormones This list may not describe all possible interactions. Give your health care provider a list of all the medicines, herbs, non-prescription drugs, or dietary supplements you use. Also tell them if you smoke, drink alcohol, or use illegal drugs. Some items may interact with your medicine. What should I watch for while using this medicine? Tell your doctor or health care professional if your symptoms do not improve. Do not use extra albuterol. If your asthma or bronchitis gets worse while you are using this medicine, call your doctor right away. If your mouth gets dry try chewing sugarless gum or sucking hard candy.  Drink water as directed. What side effects may I notice from receiving this medicine? Side effects that you should report to your doctor or health care professional as soon as possible: -allergic reactions like skin rash, itching or hives, swelling of the face, lips, or tongue -breathing problems -chest pain -feeling faint or lightheaded, falls -high blood pressure -irregular heartbeat -fever -muscle cramps or weakness -pain, tingling, numbness in the hands or feet -vomiting Side effects that usually do not require medical attention (report to your doctor or health care professional if they continue or are bothersome): -cough -difficulty sleeping -headache -nervousness or trembling -stomach upset -stuffy or runny nose -throat irritation -unusual taste This list may not describe all possible side effects. Call your doctor for medical advice about side effects. You may report side effects to FDA at 1-800-FDA-1088. Where should I keep my medicine? Keep out of the reach of children. Store at room temperature between 15 and 30 degrees C (59 and 86 degrees F). The contents are under pressure and may burst when exposed to heat or flame. Do not freeze. This medicine does not work as well if it is too cold. Throw away any unused medicine after the expiration date. Inhalers need to be thrown away after the labeled number of puffs have been used or by the expiration date; whichever comes first. Ventolin HFA should be thrown away 12 months after removing from foil pouch. Check the instructions that come with your medicine. NOTE: This  sheet is a summary. It may not cover all possible information. If you have questions about this medicine, talk to your doctor, pharmacist, or health care provider.  2014, Elsevier/Gold Standard. (2012-06-09 10:57:17)  Prednisone tablets What is this medicine? PREDNISONE (PRED ni sone) is a corticosteroid. It is commonly used to treat inflammation of the skin, joints,  lungs, and other organs. Common conditions treated include asthma, allergies, and arthritis. It is also used for other conditions, such as blood disorders and diseases of the adrenal glands. This medicine may be used for other purposes; ask your health care provider or pharmacist if you have questions. COMMON BRAND NAME(S): Deltasone, Predone, Sterapred DS, Sterapred What should I tell my health care provider before I take this medicine? They need to know if you have any of these conditions: -Cushing's syndrome -diabetes -glaucoma -heart disease -high blood pressure -infection (especially a virus infection such as chickenpox, cold sores, or herpes) -kidney disease -liver disease -mental illness -myasthenia gravis -osteoporosis -seizures -stomach or intestine problems -thyroid disease -an unusual or allergic reaction to lactose, prednisone, other medicines, foods, dyes, or preservatives -pregnant or trying to get pregnant -breast-feeding How should I use this medicine? Take this medicine by mouth with a glass of water. Follow the directions on the prescription label. Take this medicine with food. If you are taking this medicine once a day, take it in the morning. Do not take more medicine than you are told to take. Do not suddenly stop taking your medicine because you may develop a severe reaction. Your doctor will tell you how much medicine to take. If your doctor wants you to stop the medicine, the dose may be slowly lowered over time to avoid any side effects. Talk to your pediatrician regarding the use of this medicine in children. Special care may be needed. Overdosage: If you think you have taken too much of this medicine contact a poison control center or emergency room at once. NOTE: This medicine is only for you. Do not share this medicine with others. What if I miss a dose? If you miss a dose, take it as soon as you can. If it is almost time for your next dose, talk to your  doctor or health care professional. You may need to miss a dose or take an extra dose. Do not take double or extra doses without advice. What may interact with this medicine? Do not take this medicine with any of the following medications: -metyrapone -mifepristone This medicine may also interact with the following medications: -aminoglutethimide -amphotericin B -aspirin and aspirin-like medicines -barbiturates -certain medicines for diabetes, like glipizide or glyburide -cholestyramine -cholinesterase inhibitors -cyclosporine -digoxin -diuretics -ephedrine -male hormones, like estrogens and birth control pills -isoniazid -ketoconazole -NSAIDS, medicines for pain and inflammation, like ibuprofen or naproxen -phenytoin -rifampin -toxoids -vaccines -warfarin This list may not describe all possible interactions. Give your health care provider a list of all the medicines, herbs, non-prescription drugs, or dietary supplements you use. Also tell them if you smoke, drink alcohol, or use illegal drugs. Some items may interact with your medicine. What should I watch for while using this medicine? Visit your doctor or health care professional for regular checks on your progress. If you are taking this medicine over a prolonged period, carry an identification card with your name and address, the type and dose of your medicine, and your doctor's name and address. This medicine may increase your risk of getting an infection. Tell your doctor or health care professional if  you are around anyone with measles or chickenpox, or if you develop sores or blisters that do not heal properly. If you are going to have surgery, tell your doctor or health care professional that you have taken this medicine within the last twelve months. Ask your doctor or health care professional about your diet. You may need to lower the amount of salt you eat. This medicine may affect blood sugar levels. If you have  diabetes, check with your doctor or health care professional before you change your diet or the dose of your diabetic medicine. What side effects may I notice from receiving this medicine? Side effects that you should report to your doctor or health care professional as soon as possible: -allergic reactions like skin rash, itching or hives, swelling of the face, lips, or tongue -changes in emotions or moods -changes in vision -depressed mood -eye pain -fever or chills, cough, sore throat, pain or difficulty passing urine -increased thirst -swelling of ankles, feet Side effects that usually do not require medical attention (report to your doctor or health care professional if they continue or are bothersome): -confusion, excitement, restlessness -headache -nausea, vomiting -skin problems, acne, thin and shiny skin -trouble sleeping -weight gain This list may not describe all possible side effects. Call your doctor for medical advice about side effects. You may report side effects to FDA at 1-800-FDA-1088. Where should I keep my medicine? Keep out of the reach of children. Store at room temperature between 15 and 30 degrees C (59 and 86 degrees F). Protect from light. Keep container tightly closed. Throw away any unused medicine after the expiration date. NOTE: This sheet is a summary. It may not cover all possible information. If you have questions about this medicine, talk to your doctor, pharmacist, or health care provider.  2014, Elsevier/Gold Standard. (2010-08-07 10:57:14)

## 2013-02-02 NOTE — ED Notes (Signed)
Patient awaiting disposition.

## 2013-02-02 NOTE — ED Provider Notes (Signed)
CSN: [id number]     Arrival date & time 02/01/13  2230 History   First MD Initiated Contact with Patient 02/02/13 0022     Chief Complaint  Patient presents with  . Nasal Congestion   (Consider location/radiation/quality/duration/timing/severity/associated sxs/prior Treatment) The history is provided by the patient and the spouse.   63 year old male has had chest congestion and nonproductive cough for the last 3 days. There've been no fever chills or sweats. He denies any sore throat. There've been mild bodyaches. There's been no nausea or vomiting or diarrhea. He saw his PCP yesterday who started him on levofloxacin, but he is not feeling any better. There's been no dyspnea. His wife states that he was supposed to be on oxygen several years ago but had refused.  Past Medical History  Diagnosis Date  . Nicotine addiction   . Seasonal allergies   . Bladder cancer 2000   Past Surgical History  Procedure Laterality Date  . Athroscopy of right knee  2002  . Foot surgery  2004  . Colonoscopy  MJ 2009 pmhX: POLYPS    POLYP REMOVED BUT NOT RETRIEVED  . Colonoscopy  LS 2007 ARS    ? POLYPS, no path available  . Cancerous polyps removed from bladder  2000  . Colonoscopy  09/26/2010    sessile poylp(2) pan-colnoic diverticulosis,mild/internal hemorrhoids   Family History  Problem Relation Age of Onset  . Heart attack Father   . Diabetes Sister   . Hypertension Sister   . Aneurysm Sister     brain   . Arthritis      family history   . Colon cancer Neg Hx   . Colon polyps Neg Hx    History  Substance Use Topics  . Smoking status: Current Every Day Smoker -- 0.50 packs/day for 30 years    Types: Cigarettes  . Smokeless tobacco: Not on file  . Alcohol Use: Yes     Comment: pint/month    Review of Systems  All other systems reviewed and are negative.    Allergies  Bee venom and Codeine  Home Medications   Current Outpatient Rx  Name  Route  Sig  Dispense  Refill  .  aspirin EC 81 MG tablet   Oral   Take 81 mg by mouth daily.         . benzonatate (TESSALON) 100 MG capsule   Oral   Take 1 capsule (100 mg total) by mouth 3 (three) times daily as needed for cough.   30 capsule   0   . EXPIRED: cloNIDine (CATAPRES) 0.1 MG tablet   Oral   Take 1 tablet (0.1 mg total) by mouth at bedtime.   30 tablet   5   . EPINEPHrine (EPIPEN) 0.3 mg/0.3 mL DEVI      USE AS DIRECTED IF FEELING ILL AFTER BEE STINGS.   1 Device   3   . FLUoxetine (PROZAC) 10 MG capsule               . Fluoxetine HCl, PMDD, 10 MG CAPS   Oral   Take 1 capsule (10 mg total) by mouth daily.   30 each   5   . HYDROcodone-acetaminophen (NORCO/VICODIN) 5-325 MG per tablet   Oral   Take 1 tablet by mouth every 4 (four) hours as needed for pain.   90 tablet   5   . levofloxacin (LEVAQUIN) 500 MG tablet   Oral   Take 1 tablet (500 mg  total) by mouth daily.   7 tablet   0   . Multiple Vitamins-Minerals (CENTRUM PO)   Oral   Take 1 tablet by mouth daily.          . promethazine-dextromethorphan (PROMETHAZINE-DM) 6.25-15 MG/5ML syrup      One teaspoon at bedtime as needed, for excessive cough   240 mL   0   . Tamsulosin HCl (FLOMAX) 0.4 MG CAPS   Oral   Take 0.4 mg by mouth daily after supper.           BP 121/69  Pulse 67  Temp(Src) 98.4 F (36.9 C) (Oral)  Resp 16  Ht 6\' 3"  (1.905 m)  Wt 193 lb (87.544 kg)  BMI 24.12 kg/m2  SpO2 100% Physical Exam  Nursing note and vitals reviewed.  63 year old male, resting comfortably and in no acute distress. Vital signs are normal. Oxygen saturation is 100%, which is normal. Head is normocephalic and atraumatic. PERRLA, EOMI. Oropharynx is clear. Neck is nontender and supple without adenopathy or JVD. Back is nontender and there is no CVA tenderness. Lungs are have a slightly prolonged exhalation phase without rales, wheezes, or rhonchi. Chest is nontender. Heart has regular rate and rhythm without  murmur. Abdomen is soft, flat, nontender without masses or hepatosplenomegaly and peristalsis is normoactive. Extremities have no cyanosis or edema, full range of motion is present. Skin is warm and dry without rash. Neurologic: Mental status is normal, cranial nerves are intact, there are no motor or sensory deficits.  ED Course  Procedures (including critical care time) Imaging Review Dg Chest 2 View  02/02/2013   CLINICAL DATA:  Cough, congestion, headache  EXAM: CHEST  2 VIEW  COMPARISON:  01/31/2010  FINDINGS: The heart size and mediastinal contours are within normal limits. Both lungs are clear. The visualized skeletal structures are unremarkable. Stable hyperinflation.  IMPRESSION: No active cardiopulmonary disease.   Electronically Signed   By: Daryll Brod M.D.   On: 02/02/2013 01:25     MDM   1. Acute bronchitis    Respiratory tract infection consistent with influenza, possible bronchitis. His RD on appropriate antibiotics should he actually has pneumonia but chest x-ray will be checked. Given a therapeutic trial of albuterol with ipratropium. Old records are reviewed and he was seen in his doctor's office yesterday. Of note, he does have a borderline elevated hemoglobin A1c and a mildly elevated LDL cholesterol.  He feels much better after nebulizer treatment. He is given a dose of prednisone as discharge with prescription for prednisone and is given an albuterol inhaler to take home.  Delora Fuel, MD [id number]

## 2013-02-02 NOTE — ED Notes (Signed)
Patient requesting coffee; patient instructed to wait until xray results have come back.

## 2013-02-03 ENCOUNTER — Ambulatory Visit (HOSPITAL_COMMUNITY)
Admission: RE | Admit: 2013-02-03 | Discharge: 2013-02-03 | Disposition: A | Discharge status: Home / Self Care | Payer: BC Managed Care – PPO | Source: Ambulatory Visit | Attending: Family Medicine | Admitting: Family Medicine

## 2013-02-03 DIAGNOSIS — Z8551 Personal history of malignant neoplasm of bladder: Secondary | ICD-10-CM | POA: Insufficient documentation

## 2013-02-03 DIAGNOSIS — Z87891 Personal history of nicotine dependence: Secondary | ICD-10-CM | POA: Insufficient documentation

## 2013-02-03 DIAGNOSIS — F17200 Nicotine dependence, unspecified, uncomplicated: Secondary | ICD-10-CM

## 2013-02-03 DIAGNOSIS — J449 Chronic obstructive pulmonary disease, unspecified: Secondary | ICD-10-CM | POA: Insufficient documentation

## 2013-02-03 DIAGNOSIS — J4489 Other specified chronic obstructive pulmonary disease: Secondary | ICD-10-CM | POA: Insufficient documentation

## 2013-02-07 ENCOUNTER — Ambulatory Visit: Payer: BC Managed Care – PPO | Admitting: Family Medicine

## 2013-02-22 NOTE — Assessment & Plan Note (Signed)
Updated lab needed at/ before next visit. Patient educated about the importance of limiting  Carbohydrate intake , the need to commit to daily physical activity for a minimum of 30 minutes , and to commit weight loss. The fact that changes in all these areas will reduce or eliminate all together the development of diabetes is stressed.    

## 2013-02-22 NOTE — Assessment & Plan Note (Signed)
Slightly improved though lDL still high Hyperlipidemia:Low fat diet discussed and encouraged.

## 2013-02-22 NOTE — Assessment & Plan Note (Signed)
Chronic pain and reduced mobility, managed conservatively by ortho at this time

## 2013-02-22 NOTE — Assessment & Plan Note (Signed)
worsened symptoms off medication , resume fluoxetine, mainly anxiety, not suicidal or homicidal, but a feeling of hopelesness is present

## 2013-02-22 NOTE — Assessment & Plan Note (Signed)
Unchanged. Patient counseled for approximately 5 minutes regarding the health risks of ongoing nicotine use, specifically all types of cancer, heart disease, stroke and respiratory failure. The options available for help with cessation ,the behavioral changes to assist the process, and the option to either gradully reduce usage  Or abruptly stop.is also discussed. Pt is also encouraged to set specific goals in number of cigarettes used daily, as well as to set a quit date.  

## 2013-02-22 NOTE — Assessment & Plan Note (Signed)
Antibiotics and decongestants prescribed medication also administered at office visit.    

## 2013-02-22 NOTE — Assessment & Plan Note (Signed)
Treated with no evidence of recurrence, followed by urology

## 2013-02-22 NOTE — Progress Notes (Signed)
   Subjective:    Patient ID: Matthew Adkins, male    DOB: 04/08/1950, 63 y.o.   MRN: [id number]  HPI 1 week h/o excessive chest congestion, cough productive of thick yellow sputum, fever and chills.Chest sore from excessive cough. Increasing malaise , poor sleep due to excessive cough, and reduced  appetite. Denies sinus pressure, nasal congestion or drainage,  ear pain or sore throat. No benefit from OTC medication used.     Review of Systems See HPI Denies sinus pressure, nasal congestion, ear pain or sore throat.  Denies chest pains, palpitations and leg swelling Denies abdominal pain, nausea, vomiting,diarrhea or constipation.   Denies dysuria, frequency, hesitancy or incontinence. bilateral knee pain still holding out on surgery, also mc/o back pain Denies headaches, seizures, numbness, or tingling. C/o depression and  anxiety states mainly over the health of his loved ones and becomes tearful. Denies suicidal or homicidal ideation. Denies skin break down or rash.        Objective:   Physical Exam BP 120/70  Pulse 69  Temp(Src) 99.3 F (37.4 C) (Oral)  Resp 16  Ht 6\' 3"  (1.905 m)  Wt 191 lb (86.637 kg)  BMI 23.87 kg/m2  SpO2 96%  Patient alert and oriented and in no cardiopulmonary distress.  HEENT: No facial asymmetry, EOMI, no sinus tenderness,  oropharynx pink and moist.  Neck supple no adenopathy.  Chest: decreased though adequate air entry, few crackles , no wheezes CVS: S1, S2 no murmurs, no S3.  ABD: Soft non tender. Bowel sounds normal.  Ext: No edema  MS: adequate though reduced ROM spine,  and knees.  Skin: Intact, no ulcerations or rash noted.  Psych: Good eye contact, flatl affect. Memory intact anxious , depressed appearing.Tearful at times  CNS: CN 2-12 intact, power, tone and sensation normal throughout.        Assessment & Plan:  Acute bacterial bronchitis Antibiotics and decongestants prescribed medication also administered at  office visit.    Depression worsened symptoms off medication , resume fluoxetine, mainly anxiety, not suicidal or homicidal, but a feeling of hopelesness is present  OA (osteoarthritis) of knee Chronic pain and reduced mobility, managed conservatively by ortho at this time  NICOTINE ADDICTION Unchanged Patient counseled for approximately 5 minutes regarding the health risks of ongoing nicotine use, specifically all types of cancer, heart disease, stroke and respiratory failure. The options available for help with cessation ,the behavioral changes to assist the process, and the option to either gradully reduce usage  Or abruptly stop.is also discussed. Pt is also encouraged to set specific goals in number of cigarettes used daily, as well as to set a quit date.   Prediabetes Updated lab needed at/ before next visit. Patient educated about the importance of limiting  Carbohydrate intake , the need to commit to daily physical activity for a minimum of 30 minutes , and to commit weight loss. The fact that changes in all these areas will reduce or eliminate all together the development of diabetes is stressed.     BLADDER CANCER Treated with no evidence of recurrence, followed by urology  DYSLIPIDEMIA Slightly improved though lDL still high Hyperlipidemia:Low fat diet discussed and encouraged.

## 2013-03-09 ENCOUNTER — Telehealth: Payer: Self-pay | Admitting: Family Medicine

## 2013-03-09 NOTE — Telephone Encounter (Signed)
I spoke with pt earlier this pm , stated he had not called in and did npot need to speak with me, he will come to office next week with ghois wife for her OV, he is concerned about her "nerves"

## 2013-03-10 NOTE — Telephone Encounter (Signed)
Noted  

## 2013-04-04 ENCOUNTER — Telehealth: Payer: Self-pay | Admitting: Orthopedic Surgery

## 2013-04-04 NOTE — Telephone Encounter (Signed)
Routing to Dr Harrison 

## 2013-04-04 NOTE — Telephone Encounter (Signed)
Matthew Adkins asked for a prescription for Hydrocodone 5/325  His phone # [phone]

## 2013-04-06 ENCOUNTER — Other Ambulatory Visit: Payer: Self-pay | Admitting: Orthopedic Surgery

## 2013-04-06 MED ORDER — HYDROCODONE-ACETAMINOPHEN 5-325 MG PO TABS: 1.0000 | ORAL_TABLET | ORAL | Status: DC | PRN

## 2013-04-06 NOTE — Telephone Encounter (Signed)
Refilled per Dr. Aline Brochure, and called patient to pick up.

## 2013-04-06 NOTE — Telephone Encounter (Signed)
Ok

## 2013-05-09 ENCOUNTER — Ambulatory Visit: Payer: BC Managed Care – PPO | Admitting: Family Medicine

## 2013-08-04 ENCOUNTER — Other Ambulatory Visit: Payer: Self-pay | Admitting: Family Medicine

## 2013-08-24 ENCOUNTER — Encounter: Payer: Self-pay | Admitting: Family Medicine

## 2013-08-24 ENCOUNTER — Encounter (INDEPENDENT_AMBULATORY_CARE_PROVIDER_SITE_OTHER): Payer: Self-pay

## 2013-08-24 ENCOUNTER — Ambulatory Visit (INDEPENDENT_AMBULATORY_CARE_PROVIDER_SITE_OTHER): Payer: BC Managed Care – PPO | Admitting: Family Medicine

## 2013-08-24 ENCOUNTER — Telehealth: Payer: Self-pay

## 2013-08-24 VITALS — BP 128/82 | HR 96 | Temp 98.7°F | Resp 18 | Ht 75.0 in | Wt 194.0 lb

## 2013-08-24 DIAGNOSIS — F17200 Nicotine dependence, unspecified, uncomplicated: Secondary | ICD-10-CM

## 2013-08-24 DIAGNOSIS — F3289 Other specified depressive episodes: Secondary | ICD-10-CM

## 2013-08-24 DIAGNOSIS — IMO0002 Reserved for concepts with insufficient information to code with codable children: Secondary | ICD-10-CM

## 2013-08-24 DIAGNOSIS — M5416 Radiculopathy, lumbar region: Secondary | ICD-10-CM

## 2013-08-24 DIAGNOSIS — F32A Depression, unspecified: Secondary | ICD-10-CM

## 2013-08-24 DIAGNOSIS — F329 Major depressive disorder, single episode, unspecified: Secondary | ICD-10-CM

## 2013-08-24 DIAGNOSIS — J309 Allergic rhinitis, unspecified: Secondary | ICD-10-CM

## 2013-08-24 DIAGNOSIS — R7303 Prediabetes: Secondary | ICD-10-CM

## 2013-08-24 DIAGNOSIS — J45909 Unspecified asthma, uncomplicated: Secondary | ICD-10-CM | POA: Insufficient documentation

## 2013-08-24 DIAGNOSIS — R7309 Other abnormal glucose: Secondary | ICD-10-CM

## 2013-08-24 LAB — HEMOGLOBIN A1C
Hgb A1c MFr Bld: 6.1 % — ABNORMAL HIGH (ref <5.7)
Mean Plasma Glucose: 128 mg/dL — ABNORMAL HIGH (ref <117)

## 2013-08-24 MED ORDER — METHYLPREDNISOLONE ACETATE 80 MG/ML IJ SUSP
80.0000 mg | Freq: Once | INTRAMUSCULAR | Status: AC
  Administered 2013-08-24: 80 mg via INTRAMUSCULAR

## 2013-08-24 MED ORDER — PREDNISONE (PAK) 5 MG PO TABS: 5.0000 mg | ORAL_TABLET | ORAL | Status: DC

## 2013-08-24 MED ORDER — PROMETHAZINE-DM 6.25-15 MG/5ML PO SYRP: ORAL_SOLUTION | ORAL | Status: DC

## 2013-08-24 MED ORDER — MONTELUKAST SODIUM 10 MG PO TABS: 10.0000 mg | ORAL_TABLET | Freq: Every day | ORAL | Status: DC

## 2013-08-24 NOTE — Telephone Encounter (Signed)
Noted.  Patient will be in by 11

## 2013-08-24 NOTE — Telephone Encounter (Signed)
Advise him to come in this am asap

## 2013-08-24 NOTE — Patient Instructions (Addendum)
F/u in December call if you need me before  You are treated for allergic bronchitis  Two tablets prescribed ,  and one syrup for bedtime use if needed   Depo medrol in office today  HBa1c today

## 2013-08-26 NOTE — Progress Notes (Signed)
   Subjective:    Patient ID: Matthew Adkins, male    DOB: 02/08/1950, 63 y.o.   MRN: [id number]  HPI The PT is here for follow up and re-evaluation of chronic medical conditions, medication management and review of any available recent lab and radiology data.  Preventive health is updated, specifically  Cancer screening and Immunization.    The PT denies any adverse reactions to current medications since the last visit.  4 day h/o increased cough, wheeze and chest tightness. Sputum is clear, also having increased nasal congestion and sinus drainage, no fever or chills, no know sick contact    Review of Systems See HPI Denies recent fever or chills. Denies chest pains, palpitations and leg swelling Denies abdominal pain, nausea, vomiting,diarrhea or constipation.   Denies dysuria, frequency, hesitancy or incontinence. Denies uncontrolled  joint pain, swelling and limitation in mobility. Denies headaches, seizures, numbness, or tingling. Denies uncontrolled  depression, anxiety or insomnia.Wife's health has improved, so his has also Denies skin break down or rash.        Objective:   Physical Exam BP 128/82  Pulse 96  Temp(Src) 98.7 F (37.1 C)  Resp 18  Ht 6\' 3"  (1.905 m)  Wt 194 lb (87.998 kg)  BMI 24.25 kg/m2  SpO2 98% Patient alert and oriented and in no cardiopulmonary distress.  HEENT: No facial asymmetry, EOMI,   oropharynx pink and moist.  Neck supple no JVD, no mass. Conjunctival erythema, nasal mucosa eryhtematous and edematous Chest: Clear to auscultation bilaterally.No wheezes or crackles, decreased air entry   CVS: S1, S2 no murmurs, no S3.Regular rate.  ABD: Soft non tender.   Ext: No edema  MS: Adequate ROM spine, shoulders, hips and knees.  Skin: Intact, no ulcerations or rash noted.  Psych: Good eye contact, normal affect. Memory intact not anxious or depressed appearing.  CNS: CN 2-12 intact, power,  normal throughout.no focal deficits  noted.        Assessment & Plan:  Allergic bronchitis Acute flare, steroids and singulair, also cough suppressant Pt to call if symptoms suggestive of acute infection develop in next 1 week  Prediabetes Deteriorated Patient educated about the importance of limiting  Carbohydrate intake , the need to commit to daily physical activity for a minimum of 30 minutes , and to commit weight loss. The fact that changes in all these areas will reduce or eliminate all together the development of diabetes is stressed.     ALLERGIC RHINITIS Uncontrolled x 1 week, increased symptoms , aggressive management to start  Lumbar radiculopathy Improved , no c/o pain at this time  Depression Controlled, no change in medication   NICOTINE ADDICTION Improved, reducing gradually, trying to quit, no date set as yet. Patient counseled for approximately 5 minutes regarding the health risks of ongoing nicotine use, specifically all types of cancer, heart disease, stroke and respiratory failure. The options available for help with cessation ,the behavioral changes to assist the process, and the option to either gradully reduce usage  Or abruptly stop.is also discussed. Pt is also encouraged to set specific goals in number of cigarettes used daily, as well as to set a quit date.   Insomnia Sleep hygiene reviewed and written information offered also. Prescription sent for  medication needed.

## 2013-08-26 NOTE — Assessment & Plan Note (Signed)
Uncontrolled x 1 week, increased symptoms , aggressive management to start

## 2013-08-26 NOTE — Assessment & Plan Note (Signed)
Improved , no c/o pain at this time

## 2013-08-26 NOTE — Assessment & Plan Note (Signed)
Sleep hygiene reviewed and written information offered also. Prescription sent for  medication needed.  

## 2013-08-26 NOTE — Assessment & Plan Note (Signed)
Improved, reducing gradually, trying to quit, no date set as yet. Patient counseled for approximately 5 minutes regarding the health risks of ongoing nicotine use, specifically all types of cancer, heart disease, stroke and respiratory failure. The options available for help with cessation ,the behavioral changes to assist the process, and the option to either gradully reduce usage  Or abruptly stop.is also discussed. Pt is also encouraged to set specific goals in number of cigarettes used daily, as well as to set a quit date.

## 2013-08-26 NOTE — Assessment & Plan Note (Signed)
Acute flare, steroids and singulair, also cough suppressant Pt to call if symptoms suggestive of acute infection develop in next 1 week

## 2013-08-26 NOTE — Assessment & Plan Note (Addendum)
Deteriorated Patient educated about the importance of limiting  Carbohydrate intake , the need to commit to daily physical activity for a minimum of 30 minutes , and to commit weight loss. The fact that changes in all these areas will reduce or eliminate all together the development of diabetes is stressed.    

## 2013-08-26 NOTE — Assessment & Plan Note (Signed)
Controlled, no change in medication  

## 2013-09-14 ENCOUNTER — Other Ambulatory Visit: Payer: Self-pay | Admitting: Family Medicine

## 2013-10-09 ENCOUNTER — Telehealth: Payer: Self-pay

## 2013-10-09 DIAGNOSIS — J45909 Unspecified asthma, uncomplicated: Secondary | ICD-10-CM

## 2013-10-09 MED ORDER — BENZONATATE 100 MG PO CAPS: 100.0000 mg | ORAL_CAPSULE | Freq: Three times a day (TID) | ORAL | Status: DC | PRN

## 2013-10-09 MED ORDER — PREDNISONE (PAK) 5 MG PO TABS: 5.0000 mg | ORAL_TABLET | ORAL | Status: DC

## 2013-10-09 NOTE — Telephone Encounter (Signed)
Meds sent in per verbal order.

## 2014-01-10 ENCOUNTER — Other Ambulatory Visit: Payer: Self-pay | Admitting: *Deleted

## 2014-01-10 MED ORDER — HYDROCODONE-ACETAMINOPHEN 5-325 MG PO TABS: 1.0000 | ORAL_TABLET | ORAL | Status: DC | PRN

## 2014-01-16 ENCOUNTER — Telehealth: Payer: Self-pay | Admitting: Orthopedic Surgery

## 2014-01-16 ENCOUNTER — Other Ambulatory Visit: Payer: Self-pay | Admitting: *Deleted

## 2014-01-16 MED ORDER — HYDROCODONE-ACETAMINOPHEN 5-325 MG PO TABS: 1.0000 | ORAL_TABLET | ORAL | Status: AC | PRN

## 2014-01-16 NOTE — Telephone Encounter (Signed)
Patient is calling requesting a refill on his pain medication HYDROcodone-acetaminophen (NORCO/VICODIN) 5-325 MG per tablet [[id number]] please advise?

## 2014-01-17 NOTE — Telephone Encounter (Signed)
Prescription available, patient aware  

## 2014-01-18 NOTE — Telephone Encounter (Signed)
Patient picked up Rx

## 2014-01-24 ENCOUNTER — Other Ambulatory Visit: Payer: Self-pay | Admitting: Family Medicine

## 2014-05-14 ENCOUNTER — Other Ambulatory Visit: Payer: Self-pay

## 2014-05-14 DIAGNOSIS — R7303 Prediabetes: Secondary | ICD-10-CM

## 2014-05-14 DIAGNOSIS — E785 Hyperlipidemia, unspecified: Secondary | ICD-10-CM

## 2014-05-14 DIAGNOSIS — Z125 Encounter for screening for malignant neoplasm of prostate: Secondary | ICD-10-CM

## 2014-05-19 ENCOUNTER — Other Ambulatory Visit: Payer: Self-pay | Admitting: Family Medicine

## 2014-05-19 DIAGNOSIS — E785 Hyperlipidemia, unspecified: Secondary | ICD-10-CM | POA: Diagnosis not present

## 2014-05-19 DIAGNOSIS — R7309 Other abnormal glucose: Secondary | ICD-10-CM | POA: Diagnosis not present

## 2014-05-19 DIAGNOSIS — R5381 Other malaise: Secondary | ICD-10-CM | POA: Diagnosis not present

## 2014-05-20 LAB — CBC WITH DIFFERENTIAL/PLATELET
Basophils Absolute: 0.1 10*3/uL (ref 0.0–0.1)
Basophils Relative: 1 % (ref 0–1)
Eosinophils Absolute: 0.1 10*3/uL (ref 0.0–0.7)
Eosinophils Relative: 2 % (ref 0–5)
HCT: 44.7 % (ref 39.0–52.0)
Hemoglobin: 15.3 g/dL (ref 13.0–17.0)
Lymphocytes Relative: 27 % (ref 12–46)
Lymphs Abs: 1.7 10*3/uL (ref 0.7–4.0)
MCH: 31 pg (ref 26.0–34.0)
MCHC: 34.2 g/dL (ref 30.0–36.0)
MCV: 90.5 fL (ref 78.0–100.0)
MPV: 11.1 fL (ref 8.6–12.4)
Monocytes Absolute: 0.5 10*3/uL (ref 0.1–1.0)
Monocytes Relative: 8 % (ref 3–12)
NEUTROS PCT: 62 % (ref 43–77)
Neutro Abs: 3.9 10*3/uL (ref 1.7–7.7)
Platelets: 212 10*3/uL (ref 150–400)
RBC: 4.94 MIL/uL (ref 4.22–5.81)
RDW: 13.5 % (ref 11.5–15.5)
WBC: 6.3 10*3/uL (ref 4.0–10.5)

## 2014-05-20 LAB — LIPID PANEL
Cholesterol: 184 mg/dL (ref 0–200)
HDL: 52 mg/dL (ref >=40)
LDL Cholesterol: 118 mg/dL — ABNORMAL HIGH (ref 0–99)
TRIGLYCERIDES: 69 mg/dL (ref <150)
Total CHOL/HDL Ratio: 3.5 Ratio
VLDL: 14 mg/dL (ref 0–40)

## 2014-05-20 LAB — TSH: TSH: 1.463 u[IU]/mL (ref 0.350–4.500)

## 2014-05-21 ENCOUNTER — Encounter: Payer: BC Managed Care – PPO | Admitting: Family Medicine

## 2014-05-21 LAB — COMPREHENSIVE METABOLIC PANEL
ALT: 12 U/L (ref 0–53)
AST: 10 U/L (ref 0–37)
Albumin: 3.8 g/dL (ref 3.5–5.2)
Alkaline Phosphatase: 52 U/L (ref 39–117)
BUN: 14 mg/dL (ref 6–23)
CHLORIDE: 103 meq/L (ref 96–112)
CO2: 26 meq/L (ref 19–32)
Calcium: 8.9 mg/dL (ref 8.4–10.5)
Creat: 0.96 mg/dL (ref 0.50–1.35)
Glucose, Bld: 105 mg/dL — ABNORMAL HIGH (ref 70–99)
Potassium: 4.3 mEq/L (ref 3.5–5.3)
SODIUM: 138 meq/L (ref 135–145)
TOTAL PROTEIN: 6.3 g/dL (ref 6.0–8.3)
Total Bilirubin: 0.8 mg/dL (ref 0.2–1.2)

## 2014-05-22 LAB — HEMOGLOBIN A1C
HEMOGLOBIN A1C: 6.2 % — AB (ref <5.7)
Mean Plasma Glucose: 131 mg/dL — ABNORMAL HIGH (ref <117)

## 2014-05-30 ENCOUNTER — Ambulatory Visit (INDEPENDENT_AMBULATORY_CARE_PROVIDER_SITE_OTHER): Payer: Medicare Other | Admitting: Family Medicine

## 2014-05-30 ENCOUNTER — Encounter: Payer: Self-pay | Admitting: Family Medicine

## 2014-05-30 VITALS — BP 122/72 | HR 73 | Resp 18 | Ht 75.0 in | Wt 195.0 lb

## 2014-05-30 DIAGNOSIS — Z0000 Encounter for general adult medical examination without abnormal findings: Secondary | ICD-10-CM

## 2014-05-30 DIAGNOSIS — Z23 Encounter for immunization: Secondary | ICD-10-CM

## 2014-05-30 DIAGNOSIS — Z8551 Personal history of malignant neoplasm of bladder: Secondary | ICD-10-CM

## 2014-05-30 DIAGNOSIS — Z720 Tobacco use: Secondary | ICD-10-CM

## 2014-05-30 DIAGNOSIS — R972 Elevated prostate specific antigen [PSA]: Secondary | ICD-10-CM

## 2014-05-30 DIAGNOSIS — Z1211 Encounter for screening for malignant neoplasm of colon: Secondary | ICD-10-CM

## 2014-05-30 DIAGNOSIS — F17200 Nicotine dependence, unspecified, uncomplicated: Secondary | ICD-10-CM

## 2014-05-30 DIAGNOSIS — Z125 Encounter for screening for malignant neoplasm of prostate: Secondary | ICD-10-CM

## 2014-05-30 DIAGNOSIS — Z1159 Encounter for screening for other viral diseases: Secondary | ICD-10-CM

## 2014-05-30 DIAGNOSIS — F17210 Nicotine dependence, cigarettes, uncomplicated: Secondary | ICD-10-CM

## 2014-05-30 DIAGNOSIS — J45909 Unspecified asthma, uncomplicated: Secondary | ICD-10-CM

## 2014-05-30 LAB — POC HEMOCCULT BLD/STL (OFFICE/1-CARD/DIAGNOSTIC): FECAL OCCULT BLD: NEGATIVE

## 2014-05-30 MED ORDER — MONTELUKAST SODIUM 10 MG PO TABS: 10.0000 mg | ORAL_TABLET | Freq: Every day | ORAL | Status: DC

## 2014-05-30 NOTE — Patient Instructions (Signed)
Annual wrllness in 5 month, call if you need me before  Prevnar today  You are referred for abdominal US to screen for AAA (aortic aneurysm) due to long smoking history , also for low dose chest scan due to over 30 pack year history.  PLEASE STOP SMOKING TODAY, call 1800 Quit now for help  You are referred to urologist due to h/o bladder cancer  Reduce chesse, chocolate and sweets blood sugar and bad cholesterol are slightly high  BP is excellent  Please work on good  health habits so that your health will improve. 1. Commitment to daily physical activity for 30 to 60  minutes, if you are able to do this.  2. Commitment to wise food choices. Aim for half of your  food intake to be vegetable and fruit, one quarter starchy foods, and one quarter protein. Try to eat on a regular schedule  3 meals per day, snacking between meals should be limited to vegetables or fruits or small portions of nuts. 64 ounces of water per day is generally recommended, unless you have specific health conditions, like heart failure or kidney failure where you will need to limit fluid intake.  3. Commitment to sufficient and a  good quality of physical and mental rest daily, generally between 6 to 8 hours per day.  WITH PERSISTANCE AND PERSEVERANCE, THE IMPOSSIBLE , BECOMES THE NORM!   Thanks for choosing [facility], we consider it a privelige to serve you. Singulair is refilled

## 2014-05-30 NOTE — Progress Notes (Signed)
   Subjective:    Patient ID: Matthew Adkins, male    DOB: 1950-11-28, 64 y.o.   MRN: [id number]  HPI Patient is in for annual physical exam. No other health concerns are expressed or addressed at the visit. Recent labs, if available are reviewed. Immunization is reviewed , and  updated if needed.    Review of Systems See HPI Denies recent fever or chills. Denies sinus pressure, nasal congestion, ear pain or sore throat. Denies chest congestion, productive cough or wheezing. Denies chest pains, palpitations and leg swelling Denies abdominal pain, nausea, vomiting,diarrhea or constipation.   Denies dysuria, frequency, hesitancy or incontinence. Denies joint pain, swelling and limitation in mobility. Denies headaches, seizures, numbness, or tingling. Denies uncontrolled  depression, anxiety or insomnia. Denies skin break down or rash.        Objective:   Physical Exam  BP 122/72 mmHg  Pulse 73  Resp 18  Ht 6\' 3"  (1.905 m)  Wt 195 lb 0.6 oz (88.47 kg)  BMI 24.38 kg/m2  SpO2 99% Pleasant well nourished male, alert and oriented x 3, in no cardio-pulmonary distress. Afebrile. HEENT No facial trauma or asymetry. Sinuses non tender. EOMI, PERTL,  External ears normal, tympanic membranes clear. Oropharynx moist, no exudate, fair  dentition. Neck: supple, no adenopathy,JVD or thyromegaly.No bruits.  Chest: Clear to ascultation bilaterally.No crackles or wheezes.Decreased though adequate air entry  Non tender to palpation  Breast: No asymetry,no masses. No nipple discharge or inversion. No axillary or supraclavicular adenopathy  Cardiovascular system; Heart sounds normal,  S1 and  S2 ,no S3.  No murmur, or thrill. Apical beat not displaced Peripheral pulses normal.  Abdomen: Soft, non tender, no organomegaly or masses. No bruits. Bowel sounds normal. No guarding, tenderness or rebound.  Rectal:  Normal sphincter tone. No hemorrhoids or  masses. guaiac  negative stool. Prostate smooth and firm  GU: Not examined  Musculoskeletal exam: Full ROM of spine, hips , shoulders and knees. No deformity ,swelling or crepitus noted. No muscle wasting or atrophy.   Neurologic: Cranial nerves 2 to 12 intact. Power, tone ,sensation and reflexes normal throughout. No disturbance in gait. No tremor.  Skin: Intact, no ulceration, erythema , scaling or rash noted. Pigmentation normal throughout  Psych; Normal mood and affect. Judgement and concentration normal        Assessment & Plan:  Annual physical exam Annual exam as documented. Counseling done  re healthy lifestyle involving commitment to 150 minutes exercise per week, heart healthy diet, and nicotine cessation.The importance of adequate sleep also discussed. Regular seat belt use and home safety, is also discussed. Changes in health habits are decided on by the patient with goals and time frames  set for achieving them. Immunization and cancer screening needs are specifically addressed at this visit.    Need for vaccination with 13-polyvalent pneumococcal conjugate vaccine After obtaining informed consent, the vaccine is  administered by LPN.    NICOTINE ADDICTION Patient counseled for approximately 5 minutes regarding the health risks of ongoing nicotine use, specifically all types of cancer, heart disease, stroke and respiratory failure. The options available for help with cessation ,the behavioral changes to assist the process, and the option to either gradully reduce usage  Or abruptly stop.is also discussed. Pt is also encouraged to set specific goals in number of cigarettes used daily, as well as to set a quit date.  Number of cigarettes/cigars currently smoking daily: 7 to 10

## 2014-05-31 ENCOUNTER — Other Ambulatory Visit: Payer: Self-pay | Admitting: Family Medicine

## 2014-05-31 DIAGNOSIS — Z8551 Personal history of malignant neoplasm of bladder: Secondary | ICD-10-CM

## 2014-05-31 DIAGNOSIS — R972 Elevated prostate specific antigen [PSA]: Secondary | ICD-10-CM

## 2014-05-31 LAB — PSA: PSA: 19.85 ng/mL — ABNORMAL HIGH (ref <=4.00)

## 2014-05-31 LAB — HIV ANTIBODY (ROUTINE TESTING W REFLEX): HIV: NONREACTIVE

## 2014-06-01 ENCOUNTER — Other Ambulatory Visit: Payer: Self-pay | Admitting: Urology

## 2014-06-01 ENCOUNTER — Ambulatory Visit (INDEPENDENT_AMBULATORY_CARE_PROVIDER_SITE_OTHER): Payer: Medicare Other | Admitting: Urology

## 2014-06-01 DIAGNOSIS — Z8551 Personal history of malignant neoplasm of bladder: Secondary | ICD-10-CM | POA: Diagnosis not present

## 2014-06-01 DIAGNOSIS — R351 Nocturia: Secondary | ICD-10-CM | POA: Diagnosis not present

## 2014-06-01 DIAGNOSIS — R972 Elevated prostate specific antigen [PSA]: Secondary | ICD-10-CM | POA: Diagnosis not present

## 2014-06-01 DIAGNOSIS — N401 Enlarged prostate with lower urinary tract symptoms: Secondary | ICD-10-CM

## 2014-06-05 ENCOUNTER — Ambulatory Visit (HOSPITAL_COMMUNITY)
Admission: RE | Admit: 2014-06-05 | Discharge: 2014-06-05 | Disposition: A | Discharge status: Home / Self Care | Payer: Medicare Other | Source: Ambulatory Visit | Attending: Family Medicine | Admitting: Family Medicine

## 2014-06-05 DIAGNOSIS — Z87891 Personal history of nicotine dependence: Secondary | ICD-10-CM | POA: Insufficient documentation

## 2014-06-05 DIAGNOSIS — Z1389 Encounter for screening for other disorder: Secondary | ICD-10-CM | POA: Insufficient documentation

## 2014-06-05 DIAGNOSIS — E785 Hyperlipidemia, unspecified: Secondary | ICD-10-CM | POA: Insufficient documentation

## 2014-06-05 DIAGNOSIS — Z136 Encounter for screening for cardiovascular disorders: Secondary | ICD-10-CM | POA: Diagnosis not present

## 2014-06-05 DIAGNOSIS — F17200 Nicotine dependence, unspecified, uncomplicated: Secondary | ICD-10-CM

## 2014-06-06 DIAGNOSIS — R972 Elevated prostate specific antigen [PSA]: Secondary | ICD-10-CM | POA: Diagnosis not present

## 2014-06-07 ENCOUNTER — Encounter: Payer: Self-pay | Admitting: Family Medicine

## 2014-06-07 DIAGNOSIS — Z0000 Encounter for general adult medical examination without abnormal findings: Secondary | ICD-10-CM | POA: Insufficient documentation

## 2014-06-07 DIAGNOSIS — Z23 Encounter for immunization: Secondary | ICD-10-CM | POA: Insufficient documentation

## 2014-06-07 NOTE — Assessment & Plan Note (Signed)
Patient counseled for approximately 5 minutes regarding the health risks of ongoing nicotine use, specifically all types of cancer, heart disease, stroke and respiratory failure. The options available for help with cessation ,the behavioral changes to assist the process, and the option to either gradully reduce usage  Or abruptly stop.is also discussed. Pt is also encouraged to set specific goals in number of cigarettes used daily, as well as to set a quit date.  Number of cigarettes/cigars currently smoking daily: 7 to 10  

## 2014-06-07 NOTE — Assessment & Plan Note (Signed)
Annual exam as documented. Counseling done  re healthy lifestyle involving commitment to 150 minutes exercise per week, heart healthy diet, and nicotine cessation.The importance of adequate sleep also discussed. Regular seat belt use and home safety, is also discussed. Changes in health habits are decided on by the patient with goals and time frames  set for achieving them. Immunization and cancer screening needs are specifically addressed at this visit.

## 2014-06-07 NOTE — Assessment & Plan Note (Signed)
After obtaining informed consent, the vaccine is  administered by LPN.  

## 2014-06-11 ENCOUNTER — Ambulatory Visit (HOSPITAL_COMMUNITY)
Admission: RE | Admit: 2014-06-11 | Discharge: 2014-06-11 | Disposition: A | Discharge status: Home / Self Care | Payer: Medicare Other | Source: Ambulatory Visit | Attending: Family Medicine | Admitting: Family Medicine

## 2014-06-11 ENCOUNTER — Encounter (HOSPITAL_COMMUNITY): Payer: Self-pay

## 2014-06-11 ENCOUNTER — Ambulatory Visit (HOSPITAL_COMMUNITY): Payer: BC Managed Care – PPO

## 2014-06-11 DIAGNOSIS — Z8551 Personal history of malignant neoplasm of bladder: Secondary | ICD-10-CM | POA: Diagnosis not present

## 2014-06-11 DIAGNOSIS — I700 Atherosclerosis of aorta: Secondary | ICD-10-CM | POA: Insufficient documentation

## 2014-06-11 DIAGNOSIS — R972 Elevated prostate specific antigen [PSA]: Secondary | ICD-10-CM

## 2014-06-11 DIAGNOSIS — J432 Centrilobular emphysema: Secondary | ICD-10-CM | POA: Diagnosis not present

## 2014-06-11 DIAGNOSIS — N281 Cyst of kidney, acquired: Secondary | ICD-10-CM | POA: Insufficient documentation

## 2014-06-11 DIAGNOSIS — F17200 Nicotine dependence, unspecified, uncomplicated: Secondary | ICD-10-CM | POA: Insufficient documentation

## 2014-06-11 DIAGNOSIS — K769 Liver disease, unspecified: Secondary | ICD-10-CM | POA: Diagnosis not present

## 2014-06-11 DIAGNOSIS — J439 Emphysema, unspecified: Secondary | ICD-10-CM | POA: Diagnosis not present

## 2014-06-11 DIAGNOSIS — R05 Cough: Secondary | ICD-10-CM | POA: Diagnosis present

## 2014-06-11 MED ORDER — IOHEXOL 300 MG/ML  SOLN
80.0000 mL | Freq: Once | INTRAMUSCULAR | Status: AC | PRN
  Administered 2014-06-11: 80 mL via INTRAVENOUS

## 2014-07-02 ENCOUNTER — Other Ambulatory Visit: Payer: Self-pay | Admitting: Urology

## 2014-07-02 DIAGNOSIS — R972 Elevated prostate specific antigen [PSA]: Secondary | ICD-10-CM

## 2014-07-06 ENCOUNTER — Encounter (HOSPITAL_COMMUNITY): Payer: Self-pay

## 2014-07-06 ENCOUNTER — Ambulatory Visit (INDEPENDENT_AMBULATORY_CARE_PROVIDER_SITE_OTHER): Payer: Medicare Other | Admitting: Urology

## 2014-07-06 ENCOUNTER — Ambulatory Visit (HOSPITAL_COMMUNITY)
Admission: RE | Admit: 2014-07-06 | Discharge: 2014-07-06 | Disposition: A | Discharge status: Home / Self Care | Payer: Medicare Other | Source: Ambulatory Visit | Attending: Urology | Admitting: Urology

## 2014-07-06 DIAGNOSIS — R972 Elevated prostate specific antigen [PSA]: Secondary | ICD-10-CM | POA: Diagnosis not present

## 2014-07-06 DIAGNOSIS — Z8551 Personal history of malignant neoplasm of bladder: Secondary | ICD-10-CM | POA: Diagnosis not present

## 2014-07-06 DIAGNOSIS — C61 Malignant neoplasm of prostate: Secondary | ICD-10-CM | POA: Insufficient documentation

## 2014-07-06 DIAGNOSIS — N401 Enlarged prostate with lower urinary tract symptoms: Secondary | ICD-10-CM | POA: Diagnosis not present

## 2014-07-06 HISTORY — PX: PROSTATE BIOPSY: SHX241

## 2014-07-06 HISTORY — DX: Malignant neoplasm of prostate: C61

## 2014-07-06 MED ORDER — CEFTRIAXONE SODIUM 1 G IJ SOLR
1.0000 g | Freq: Once | INTRAMUSCULAR | Status: AC
  Administered 2014-07-06: 1 g via INTRAMUSCULAR

## 2014-07-06 MED ORDER — LIDOCAINE HCL (PF) 1 % IJ SOLN
INTRAMUSCULAR | Status: AC
  Administered 2014-07-06: 5 mL
  Filled 2014-07-06: qty 5

## 2014-07-06 MED ORDER — LIDOCAINE HCL (PF) 2 % IJ SOLN
INTRAMUSCULAR | Status: AC
  Administered 2014-07-06: 10 mL
  Filled 2014-07-06: qty 10

## 2014-07-06 MED ORDER — CEFTRIAXONE SODIUM 1 G IJ SOLR
INTRAMUSCULAR | Status: AC
  Administered 2014-07-06: 1 g via INTRAMUSCULAR
  Filled 2014-07-06: qty 10

## 2014-07-06 NOTE — Discharge Instructions (Signed)
Transrectal Ultrasound-Guided Biopsy °A transrectal ultrasound-guided biopsy is a procedure to remove samples of tissue from your prostate using ultrasound images to guide the procedure. The procedure is usually done to evaluate the prostate gland of men who have an elevated prostate-specific antigen (PSA). PSA is a blood test to screen for prostate cancer. The biopsy samples are taken to check for prostate cancer.  °LET YOUR HEALTH CARE PROVIDER KNOW ABOUT: °· Any allergies you have. °· All medicines you are taking, including vitamins, herbs, eye drops, creams, and over-the-counter medicines. °· Previous problems you or members of your family have had with the use of anesthetics. °· Any blood disorders you have. °· Previous surgeries you have had. °· Medical conditions you have. °RISKS AND COMPLICATIONS °Generally, this is a safe procedure. However, as with any procedure, problems can occur. Possible problems include: °· Infection of your prostate. °· Bleeding from your rectum or blood in your urine. °· Difficulty urinating. °· Nerve damage (this is usually temporary). °· Damage to surrounding structures such as blood vessels, organs, and muscles, which would require other procedures. °BEFORE THE PROCEDURE °· Do not eat or drink anything after midnight on the night before the procedure or as directed by your health care provider. °· Take medicines only as directed by your health care provider. °· Your health care provider may have you stop taking certain medicines 5-7 days before the procedure. °· You will be given an enema before the procedure. During an enema, a liquid is injected into your rectum to clear out waste. °· You may have lab tests the day of your procedure.   °· Plan to have someone take you home after the procedure. °PROCEDURE  °· You will be given medicine to help you relax (sedative) before the procedure. An IV tube will be inserted into one of your veins and used to give fluids and  medicine. °· You will be given antibiotic medicine to reduce the risk of an infection. °· You will be placed on your side for the procedure. °· A probe with lubricated gel will be placed into your rectum, and images will be taken of your prostate and surrounding structures. °· Numbing medicine will be injected into the prostate before the biopsy samples are taken. °· A biopsy needle will then be inserted and guided to your prostate with the use of the ultrasound images. °· Samples of prostate tissue will be taken, and the needle will then be removed. °· The biopsy samples will be sent to a lab to be analyzed. Results are usually back in 2-3 days. °AFTER THE PROCEDURE °· You will be taken to a recovery area where you will be monitored. °· You may have some discomfort in the rectal area. You will be given pain medicines to control this. °· You may be allowed to go home the same day, or you may need to stay in the hospital overnight. °Document Released: 05/08/2013 Document Reviewed: 08/10/2012 °[employer]® Patient Information ©2015 [employer]. This information is not intended to replace advice given to you by your health care provider. Make sure you discuss any questions you have with your health care provider. ° °

## 2014-07-10 ENCOUNTER — Other Ambulatory Visit: Payer: Self-pay | Admitting: Family Medicine

## 2014-07-13 ENCOUNTER — Other Ambulatory Visit: Payer: Self-pay | Admitting: Urology

## 2014-07-13 DIAGNOSIS — C61 Malignant neoplasm of prostate: Secondary | ICD-10-CM

## 2014-07-16 ENCOUNTER — Encounter (HOSPITAL_COMMUNITY): Payer: Self-pay

## 2014-07-16 ENCOUNTER — Encounter (HOSPITAL_COMMUNITY)
Admission: RE | Admit: 2014-07-16 | Discharge: 2014-07-16 | Disposition: A | Discharge status: Home / Self Care | Payer: Medicare Other | Source: Ambulatory Visit | Attending: Urology | Admitting: Urology

## 2014-07-16 DIAGNOSIS — C61 Malignant neoplasm of prostate: Secondary | ICD-10-CM | POA: Diagnosis not present

## 2014-07-16 MED ORDER — TECHNETIUM TC 99M MEDRONATE IV KIT
25.0000 | PACK | Freq: Once | INTRAVENOUS | Status: AC | PRN
  Administered 2014-07-16: 27 via INTRAVENOUS

## 2014-07-18 ENCOUNTER — Encounter (HOSPITAL_COMMUNITY): Payer: Self-pay

## 2014-07-18 ENCOUNTER — Ambulatory Visit (HOSPITAL_COMMUNITY)
Admission: RE | Admit: 2014-07-18 | Discharge: 2014-07-18 | Disposition: A | Discharge status: Home / Self Care | Payer: Medicare Other | Source: Ambulatory Visit | Attending: Urology | Admitting: Urology

## 2014-07-18 DIAGNOSIS — C61 Malignant neoplasm of prostate: Secondary | ICD-10-CM | POA: Diagnosis not present

## 2014-07-18 DIAGNOSIS — R978 Other abnormal tumor markers: Secondary | ICD-10-CM | POA: Diagnosis not present

## 2014-07-18 DIAGNOSIS — R972 Elevated prostate specific antigen [PSA]: Secondary | ICD-10-CM | POA: Diagnosis not present

## 2014-07-18 LAB — POCT I-STAT CREATININE: Creatinine, Ser: 1.1 mg/dL (ref 0.61–1.24)

## 2014-07-18 MED ORDER — IOHEXOL 300 MG/ML  SOLN
100.0000 mL | Freq: Once | INTRAMUSCULAR | Status: AC | PRN
  Administered 2014-07-18: 100 mL via INTRAVENOUS

## 2014-07-20 ENCOUNTER — Other Ambulatory Visit: Payer: Self-pay

## 2014-07-20 ENCOUNTER — Emergency Department (HOSPITAL_COMMUNITY)
Admission: EM | Admit: 2014-07-20 | Discharge: 2014-07-20 | Disposition: A | Discharge status: Home / Self Care | Payer: Medicare Other | Attending: Emergency Medicine | Admitting: Emergency Medicine

## 2014-07-20 ENCOUNTER — Encounter (HOSPITAL_COMMUNITY): Payer: Self-pay | Admitting: *Deleted

## 2014-07-20 DIAGNOSIS — Z7982 Long term (current) use of aspirin: Secondary | ICD-10-CM | POA: Diagnosis not present

## 2014-07-20 DIAGNOSIS — R55 Syncope and collapse: Secondary | ICD-10-CM | POA: Insufficient documentation

## 2014-07-20 DIAGNOSIS — R42 Dizziness and giddiness: Secondary | ICD-10-CM | POA: Diagnosis not present

## 2014-07-20 DIAGNOSIS — Z8551 Personal history of malignant neoplasm of bladder: Secondary | ICD-10-CM | POA: Diagnosis not present

## 2014-07-20 DIAGNOSIS — R739 Hyperglycemia, unspecified: Secondary | ICD-10-CM | POA: Diagnosis not present

## 2014-07-20 DIAGNOSIS — Z720 Tobacco use: Secondary | ICD-10-CM | POA: Diagnosis not present

## 2014-07-20 DIAGNOSIS — Z79899 Other long term (current) drug therapy: Secondary | ICD-10-CM | POA: Insufficient documentation

## 2014-07-20 DIAGNOSIS — R61 Generalized hyperhidrosis: Secondary | ICD-10-CM | POA: Diagnosis not present

## 2014-07-20 LAB — COMPREHENSIVE METABOLIC PANEL
ALK PHOS: 47 U/L (ref 38–126)
ALT: 14 U/L — AB (ref 17–63)
ANION GAP: 7 (ref 5–15)
AST: 15 U/L (ref 15–41)
Albumin: 3.8 g/dL (ref 3.5–5.0)
BUN: 12 mg/dL (ref 6–20)
CO2: 28 mmol/L (ref 22–32)
CREATININE: 1.02 mg/dL (ref 0.61–1.24)
Calcium: 8.7 mg/dL — ABNORMAL LOW (ref 8.9–10.3)
Chloride: 105 mmol/L (ref 101–111)
GFR calc non Af Amer: >60 mL/min (ref >60)
Glucose, Bld: 146 mg/dL — ABNORMAL HIGH (ref 65–99)
POTASSIUM: 4.2 mmol/L (ref 3.5–5.1)
SODIUM: 140 mmol/L (ref 135–145)
TOTAL PROTEIN: 6.8 g/dL (ref 6.5–8.1)
Total Bilirubin: 0.8 mg/dL (ref 0.3–1.2)

## 2014-07-20 LAB — CBC
HEMATOCRIT: 43.8 % (ref 39.0–52.0)
Hemoglobin: 14.4 g/dL (ref 13.0–17.0)
MCH: 30.7 pg (ref 26.0–34.0)
MCHC: 32.9 g/dL (ref 30.0–36.0)
MCV: 93.4 fL (ref 78.0–100.0)
Platelets: 162 10*3/uL (ref 150–400)
RBC: 4.69 MIL/uL (ref 4.22–5.81)
RDW: 13.8 % (ref 11.5–15.5)
WBC: 5 10*3/uL (ref 4.0–10.5)

## 2014-07-20 LAB — URINALYSIS, ROUTINE W REFLEX MICROSCOPIC
Bilirubin Urine: NEGATIVE
GLUCOSE, UA: NEGATIVE mg/dL
Ketones, ur: NEGATIVE mg/dL
LEUKOCYTES UA: NEGATIVE
NITRITE: NEGATIVE
Protein, ur: NEGATIVE mg/dL
Specific Gravity, Urine: >1.03 — ABNORMAL HIGH (ref 1.005–1.030)
Urobilinogen, UA: 0.2 mg/dL (ref 0.0–1.0)
pH: 5.5 (ref 5.0–8.0)

## 2014-07-20 LAB — URINE MICROSCOPIC-ADD ON

## 2014-07-20 LAB — CBG MONITORING, ED: GLUCOSE-CAPILLARY: 120 mg/dL — AB (ref 65–99)

## 2014-07-20 LAB — TROPONIN I: Troponin I: <0.03 ng/mL (ref <0.031)

## 2014-07-20 MED ORDER — METFORMIN HCL 500 MG PO TABS: 500.0000 mg | ORAL_TABLET | Freq: Two times a day (BID) | ORAL | Status: DC

## 2014-07-20 NOTE — ED Provider Notes (Signed)
CSN: [id number]     Arrival date & time 07/20/14  54 History   First MD Initiated Contact with Patient 07/20/14 1703     Chief Complaint  Patient presents with  . Hyperglycemia     (Consider location/radiation/quality/duration/timing/severity/associated sxs/prior Treatment) Patient is a 64 y.o. male presenting with hyperglycemia. The history is provided by the patient.  Hyperglycemia Associated symptoms: diaphoresis   Associated symptoms: no abdominal pain and no chest pain    patient presents with lightheadedness. States he was at home after having golfed in the morning and got up and began to feel lightheaded. states he began to sweat. He fell a little dizzy and drink some orange juice since he thought his sugar may have been low. He checked it on his wife's meter and was found to be in the 400s. He rechecked again and went up to 500. Then took 2 of his wife's metformin and came to the ER. States it was 120 upon arrival. No urinary frequency. No chest pain. No weight loss or weight gain. No polyuria or polydipsia.  Past Medical History  Diagnosis Date  . Nicotine addiction   . Seasonal allergies   . Bladder cancer 2000   Past Surgical History  Procedure Laterality Date  . Athroscopy of right knee  2002  . Foot surgery  2004  . Colonoscopy  MJ 2009 pmhX: POLYPS    POLYP REMOVED BUT NOT RETRIEVED  . Colonoscopy  LS 2007 ARS    ? POLYPS, no path available  . Cancerous polyps removed from bladder  2000  . Colonoscopy  09/26/2010    sessile poylp(2) pan-colnoic diverticulosis,mild/internal hemorrhoids   Family History  Problem Relation Age of Onset  . Heart attack Father   . Diabetes Sister   . Hypertension Sister   . Aneurysm Sister     brain   . Arthritis      family history   . Colon cancer Neg Hx   . Colon polyps Neg Hx    History  Substance Use Topics  . Smoking status: Current Every Day Smoker -- 0.50 packs/day for 30 years    Types: Cigarettes  . Smokeless  tobacco: Not on file  . Alcohol Use: Yes     Comment: pint/month    Review of Systems  Constitutional: Positive for diaphoresis and activity change. Negative for appetite change.  Respiratory: Negative for chest tightness.   Cardiovascular: Negative for chest pain.  Gastrointestinal: Negative for abdominal pain.  Genitourinary: Negative for enuresis.  Musculoskeletal: Negative for back pain.  Skin: Negative for wound.  Neurological: Positive for light-headedness. Negative for syncope and headaches.      Allergies  Bee venom and Codeine  Home Medications   Prior to Admission medications   Medication Sig Start Date End Date Taking? Authorizing Provider  aspirin EC 81 MG tablet Take 81 mg by mouth 3 (three) times a week.    Yes Historical Provider, MD  EPINEPHrine (EPIPEN) 0.3 mg/0.3 mL DEVI USE AS DIRECTED IF FEELING ILL AFTER BEE STINGS. 08/31/11  Yes Fayrene Helper, MD  FLUoxetine (PROZAC) 10 MG capsule TAKE 1 CAPSULE BY MOUTH DAILY. 07/10/14  Yes Fayrene Helper, MD  montelukast (SINGULAIR) 10 MG tablet Take 1 tablet (10 mg total) by mouth at bedtime. Patient taking differently: Take 10 mg by mouth daily as needed (FOR ALLERGIES).  05/30/14  Yes Fayrene Helper, MD  Multiple Vitamins-Minerals (CENTRUM PO) Take 1 tablet by mouth daily.    Yes Historical  Provider, MD  Tamsulosin HCl (FLOMAX) 0.4 MG CAPS Take 0.4 mg by mouth daily after supper.    Yes Historical Provider, MD  metFORMIN (GLUCOPHAGE) 500 MG tablet Take 1 tablet (500 mg total) by mouth 2 (two) times daily with a meal. 07/20/14   Davonna Belling, MD   BP 102/62 mmHg  Pulse 48  Temp(Src) 97.8 F (36.6 C) (Oral)  Resp 18  Ht 6\' 3"  (1.905 m)  Wt 195 lb (88.451 kg)  BMI 24.37 kg/m2  SpO2 100% Physical Exam  Constitutional: He appears well-developed.  HENT:  Head: Normocephalic.  Eyes: EOM are normal.  Neck: Neck supple.  Cardiovascular: Normal rate and regular rhythm.   Abdominal: Soft.   Musculoskeletal: Normal range of motion.  Neurological: He is alert.  Skin: Skin is warm.    ED Course  Procedures (including critical care time) Labs Review Labs Reviewed  COMPREHENSIVE METABOLIC PANEL - Abnormal; Notable for the following:    Glucose, Bld 146 (*)    Calcium 8.7 (*)    ALT 14 (*)    All other components within normal limits  URINALYSIS, ROUTINE W REFLEX MICROSCOPIC (NOT AT [facility]) - Abnormal; Notable for the following:    Specific Gravity, Urine >1.030 (*)    Hgb urine dipstick LARGE (*)    All other components within normal limits  URINE MICROSCOPIC-ADD ON - Abnormal; Notable for the following:    Bacteria, UA FEW (*)    All other components within normal limits  CBG MONITORING, ED - Abnormal; Notable for the following:    Glucose-Capillary 120 (*)    All other components within normal limits  CBC  TROPONIN I    Imaging Review No results found.   EKG Interpretation   Date/Time:  Friday July 20 2014 18:16:59 EDT Ventricular Rate:  47 PR Interval:  186 QRS Duration: 92 QT Interval:  466 QTC Calculation: 412 R Axis:   41 Text Interpretation:  Sinus bradycardia Abnormal R-wave progression, early  transition No significant change since last tracing Confirmed by Alvino Chapel   MD, Ovid Curd [phone]) on 07/20/2014 6:19:55 PM      MDM   Final diagnoses:  Hyperglycemia  Near syncope    Hyperglycemia. Has had elevated HgbA1C in the past, but not diabetes. Labs reassuring now. Sugar was 500 and will start metformin with follow up.    Davonna Belling, MD 07/23/14 [phone]

## 2014-07-20 NOTE — Discharge Instructions (Signed)
Hyperglycemia °Hyperglycemia occurs when the glucose (sugar) in your blood is too high. Hyperglycemia can happen for many reasons, but it most often happens to people who do not know they have diabetes or are not managing their diabetes properly.  °CAUSES  °Whether you have diabetes or not, there are other causes of hyperglycemia. Hyperglycemia can occur when you have diabetes, but it can also occur in other situations that you might not be as aware of, such as: °Diabetes °· If you have diabetes and are having problems controlling your blood glucose, hyperglycemia could occur because of some of the following reasons: °· Not following your meal plan. °· Not taking your diabetes medications or not taking it properly. °· Exercising less or doing less activity than you normally do. °· Being sick. °Pre-diabetes °· This cannot be ignored. Before people develop Type 2 diabetes, they almost always have "pre-diabetes." This is when your blood glucose levels are higher than normal, but not yet high enough to be diagnosed as diabetes. Research has shown that some long-term damage to the body, especially the heart and circulatory system, may already be occurring during pre-diabetes. If you take action to manage your blood glucose when you have pre-diabetes, you may delay or prevent Type 2 diabetes from developing. °Stress °· If you have diabetes, you may be "diet" controlled or on oral medications or insulin to control your diabetes. However, you may find that your blood glucose is higher than usual in the hospital whether you have diabetes or not. This is often referred to as "stress hyperglycemia." Stress can elevate your blood glucose. This happens because of hormones put out by the body during times of stress. If stress has been the cause of your high blood glucose, it can be followed regularly by your caregiver. That way he/she can make sure your hyperglycemia does not continue to get worse or progress to  diabetes. °Steroids °· Steroids are medications that act on the infection fighting system (immune system) to block inflammation or infection. One side effect can be a rise in blood glucose. Most people can produce enough extra insulin to allow for this rise, but for those who cannot, steroids make blood glucose levels go even higher. It is not unusual for steroid treatments to "uncover" diabetes that is developing. It is not always possible to determine if the hyperglycemia will go away after the steroids are stopped. A special blood test called an A1c is sometimes done to determine if your blood glucose was elevated before the steroids were started. °SYMPTOMS °· Thirsty. °· Frequent urination. °· Dry mouth. °· Blurred vision. °· Tired or fatigue. °· Weakness. °· Sleepy. °· Tingling in feet or leg. °DIAGNOSIS  °Diagnosis is made by monitoring blood glucose in one or all of the following ways: °· A1c test. This is a chemical found in your blood. °· Fingerstick blood glucose monitoring. °· Laboratory results. °TREATMENT  °First, knowing the cause of the hyperglycemia is important before the hyperglycemia can be treated. Treatment may include, but is not be limited to: °· Education. °· Change or adjustment in medications. °· Change or adjustment in meal plan. °· Treatment for an illness, infection, etc. °· More frequent blood glucose monitoring. °· Change in exercise plan. °· Decreasing or stopping steroids. °· Lifestyle changes. °HOME CARE INSTRUCTIONS  °· Test your blood glucose as directed. °· Exercise regularly. Your caregiver will give you instructions about exercise. Pre-diabetes or diabetes which comes on with stress is helped by exercising. °· Eat wholesome,   balanced meals. Eat often and at regular, fixed times. Your caregiver or nutritionist will give you a meal plan to guide your sugar intake.  Being at an ideal weight is important. If needed, losing as little as 10 to 15 pounds may help improve blood  glucose levels. SEEK MEDICAL CARE IF:   You have questions about medicine, activity, or diet.  You continue to have symptoms (problems such as increased thirst, urination, or weight gain). SEEK IMMEDIATE MEDICAL CARE IF:   You are vomiting or have diarrhea.  Your breath smells fruity.  You are breathing faster or slower.  You are very sleepy or incoherent.  You have numbness, tingling, or pain in your feet or hands.  You have chest pain.  Your symptoms get worse even though you have been following your caregiver's orders.  If you have any other questions or concerns. Document Released: 06/17/2000 Document Revised: 03/16/2011 Document Reviewed: 04/20/2011 [facility] Patient Information 2015 [place], [place]. This information is not intended to replace advice given to you by your health care provider. Make sure you discuss any questions you have with your health care provider.  Near-Syncope Near-syncope (commonly known as near fainting) is sudden weakness, dizziness, or feeling like you might pass out. During an episode of near-syncope, you may also develop pale skin, have tunnel vision, or feel sick to your stomach (nauseous). Near-syncope may occur when getting up after sitting or while standing for a long time. It is caused by a sudden decrease in blood flow to the brain. This decrease can result from various causes or triggers, most of which are not serious. However, because near-syncope can sometimes be a sign of something serious, a medical evaluation is required. The specific cause is often not determined. HOME CARE INSTRUCTIONS  Monitor your condition for any changes. The following actions may help to alleviate any discomfort you are experiencing:  Have someone stay with you until you feel stable.  Lie down right away and prop your feet up if you start feeling like you might faint. Breathe deeply and steadily. Wait until all the symptoms have passed. Most of these episodes last  only a few minutes. You may feel tired for several hours.   Drink enough fluids to keep your urine clear or pale yellow.   If you are taking blood pressure or heart medicine, get up slowly when seated or lying down. Take several minutes to sit and then stand. This can reduce dizziness.  Follow up with your health care provider as directed. SEEK IMMEDIATE MEDICAL CARE IF:   You have a severe headache.   You have unusual pain in the chest, abdomen, or back.   You are bleeding from the mouth or rectum, or you have black or tarry stool.   You have an irregular or very fast heartbeat.   You have repeated fainting or have seizure-like jerking during an episode.   You faint when sitting or lying down.   You have confusion.   You have difficulty walking.   You have severe weakness.   You have vision problems.  MAKE SURE YOU:   Understand these instructions.  Will watch your condition.  Will get help right away if you are not doing well or get worse. Document Released: 12/22/2004 Document Revised: 12/27/2012 Document Reviewed: 05/27/2012 [facility] Patient Information 2015 [place], [place]. This information is not intended to replace advice given to you by your health care provider. Make sure you discuss any questions you have with your health care  provider.

## 2014-07-20 NOTE — ED Notes (Addendum)
Pt states he got up to answer the door and he got dizzy, sweaty, and hot. States he drank some OJ and it was 433, then rechecked and was 466, and 500's. Pt states he took two of his wife's 5mg  Metformin an hour ago. NAD at this time. States he feels okay at present. CBG 120

## 2014-07-23 ENCOUNTER — Encounter: Payer: Self-pay | Admitting: Medical Oncology

## 2014-07-23 ENCOUNTER — Telehealth: Payer: Self-pay | Admitting: Medical Oncology

## 2014-07-23 NOTE — Telephone Encounter (Signed)
Oncology Nurse Navigator Documentation  Oncology Nurse Navigator Flowsheets 07/23/2014  Referral date to RadOnc/MedOnc 07/20/2014  Navigator Encounter Type Introductory phone call   I called pt to introduce myself as the Prostate Nurse Navigator and the Coordinator of the [facility].  1. I confirmed with the patient he is aware of his referral to the clinic 07/31/2014 arriving at 12:15pm.  2. I discussed the format of the clinic and the physicians he will be seeing that day.  3. I discussed where the clinic is located and how to contact me.  4. I confirmed his address and informed him I would be mailing a packet of information and forms to be completed. I asked him to bring them with him the day of his appointment.   He voiced understanding of the above. I asked him to call me if he has any questions or concerns regarding his appointments or the forms he needs to complete.

## 2014-07-23 NOTE — Progress Notes (Signed)
Oncology Nurse Navigator Documentation  Oncology Nurse Navigator Flowsheets 07/23/2014 07/23/2014  Referral date to RadOnc/MedOnc 07/20/2014 -  Navigator Encounter Type Introductory phone call Telephone- I called [facility] and requested prostate biopsy slides from 07/06/2014 be sent to Dr. Orene Desanctis at [facility].

## 2014-07-24 ENCOUNTER — Telehealth: Payer: Self-pay | Admitting: Medical Oncology

## 2014-07-25 ENCOUNTER — Ambulatory Visit (INDEPENDENT_AMBULATORY_CARE_PROVIDER_SITE_OTHER): Payer: Medicare Other | Admitting: Family Medicine

## 2014-07-25 ENCOUNTER — Encounter: Payer: Self-pay | Admitting: Family Medicine

## 2014-07-25 VITALS — BP 120/74 | HR 64 | Resp 16 | Ht 75.0 in | Wt 193.0 lb

## 2014-07-25 DIAGNOSIS — R7309 Other abnormal glucose: Secondary | ICD-10-CM

## 2014-07-25 DIAGNOSIS — E785 Hyperlipidemia, unspecified: Secondary | ICD-10-CM

## 2014-07-25 DIAGNOSIS — C61 Malignant neoplasm of prostate: Secondary | ICD-10-CM | POA: Diagnosis not present

## 2014-07-25 DIAGNOSIS — F17200 Nicotine dependence, unspecified, uncomplicated: Secondary | ICD-10-CM

## 2014-07-25 DIAGNOSIS — F17210 Nicotine dependence, cigarettes, uncomplicated: Secondary | ICD-10-CM | POA: Diagnosis not present

## 2014-07-25 DIAGNOSIS — R7303 Prediabetes: Secondary | ICD-10-CM

## 2014-07-25 DIAGNOSIS — Z720 Tobacco use: Secondary | ICD-10-CM

## 2014-07-25 DIAGNOSIS — F329 Major depressive disorder, single episode, unspecified: Secondary | ICD-10-CM

## 2014-07-25 DIAGNOSIS — F32A Depression, unspecified: Secondary | ICD-10-CM

## 2014-07-25 LAB — GLUCOSE, POCT (MANUAL RESULT ENTRY): POC GLUCOSE: 112 mg/dL — AB (ref 70–99)

## 2014-07-25 NOTE — Assessment & Plan Note (Signed)
Controlled, no change in medication  

## 2014-07-25 NOTE — Assessment & Plan Note (Signed)
Deteriorated recently due to stress of new dx  Of prostate cancer, working on quitting Patient counseled for approximately 5 minutes regarding the health risks of ongoing nicotine use, specifically all types of cancer, heart disease, stroke and respiratory failure. The options available for help with cessation ,the behavioral changes to assist the process, and the option to either gradully reduce usage  Or abruptly stop.is also discussed. Pt is also encouraged to set specific goals in number of cigarettes used daily, as well as to set a quit date.  Number of cigarettes/cigars currently smoking daily:10

## 2014-07-25 NOTE — Assessment & Plan Note (Signed)
Hyperlipidemia:Low fat diet discussed and encouraged.   Lipid Panel  Lab Results  Component Value Date   CHOL 184 05/19/2014   HDL 52 05/19/2014   LDLCALC 118* 05/19/2014   TRIG 69 05/19/2014   CHOLHDL 3.5 05/19/2014      Updated lab needed at/ before next visit.

## 2014-07-25 NOTE — Patient Instructions (Signed)
F/U 8/21 or after , call if you need me sooner   DO NOT TAKE METFORMIN  Fasting LDL, HBA1C, chem 7 and EGFR 8/14 or after  It is important that you exercise regularly at least 30 minutes 5 times a week. If you develop chest pain, have severe difficulty breathing, or feel very tired, stop exercising immediately and seek medical attention    YOU ARE NOT DIABETIC,  Keep working on healthy diet and regular physical activity  Plan to stop smoking as soon as possible also please

## 2014-07-25 NOTE — Assessment & Plan Note (Addendum)
Pt understands he is not diabetic , and needs to follow low car diet and commit to daily exercise to reduce risk of becoming diabetic. Blood sugar numbers at home varied from 18 to over 500 and strips were old Pt advised not to take metformin Will rept HBa1C  When due and bring him back to discuss

## 2014-07-25 NOTE — Progress Notes (Signed)
   Matthew Adkins     MRN: [id number]      DOB: Sep 29, 1950   HPI Matthew Adkins is in from recent Ed visit when he reported blood sugar over 500 and just before states the reading was 18. New dx of prediabetes and anxious. Does state that he knew if his blood sugar was 18 he would not be able to speak, nd al;so states the strips he was using were outdated. Recently learned that his [prostate cancer is localized which is a relief, and has upcoming appt with team next week as far as best treatment options  ROS Denies recent fever or chills. Denies sinus pressure, nasal congestion, ear pain or sore throat. Denies chest congestion, productive cough or wheezing. Denies chest pains, palpitations and leg swelling Denies abdominal pain, nausea, vomiting,diarrhea or constipation.   Denies dysuria, frequency, hesitancy or incontinence. Denies joint pain, swelling and limitation in mobility. Denies headaches, seizures, numbness, or tingling. Denies depression, anxiety or insomnia. Denies skin break down or rash.   PE  BP 120/74 mmHg  Pulse 64  Resp 16  Ht 6\' 3"  (1.905 m)  Wt 193 lb (87.544 kg)  BMI 24.12 kg/m2  SpO2 98%  Patient alert and oriented and in no cardiopulmonary distress.  HEENT: No facial asymmetry, EOMI,   oropharynx pink and moist.  Neck supple no JVD, no mass.  Chest: Clear to auscultation bilaterally.  CVS: S1, S2 no murmurs, no S3.Regular rate.  ABD: Soft non tender.   Ext: No edema  MS: Adequate ROM spine, shoulders, hips and knees.  Psych: Good eye contact, normal affect. Memory intact not anxious or depressed appearing.  CNS: CN 2-12 intact, power,  normal throughout.no focal deficits noted.   Assessment & Plan  Prediabetes Pt understands he is not diabetic , and needs to follow low car diet and commit to daily exercise to reduce risk of becoming diabetic. Blood sugar numbers at home varied from 18 to over 500 and strips were old Pt advised not to take  metformin Will rept HBa1C  When due and bring him back to discuss  Dyslipidemia Hyperlipidemia:Low fat diet discussed and encouraged.   Lipid Panel  Lab Results  Component Value Date   CHOL 184 05/19/2014   HDL 52 05/19/2014   LDLCALC 118* 05/19/2014   TRIG 69 05/19/2014   CHOLHDL 3.5 05/19/2014      Updated lab needed at/ before next visit.   NICOTINE ADDICTION Deteriorated recently due to stress of new dx  Of prostate cancer, working on quitting Patient counseled for approximately 5 minutes regarding the health risks of ongoing nicotine use, specifically all types of cancer, heart disease, stroke and respiratory failure. The options available for help with cessation ,the behavioral changes to assist the process, and the option to either gradully reduce usage  Or abruptly stop.is also discussed. Pt is also encouraged to set specific goals in number of cigarettes used daily, as well as to set a quit date.  Number of cigarettes/cigars currently smoking daily:10  Depression Controlled, no change in medication

## 2014-07-30 ENCOUNTER — Encounter: Payer: Self-pay | Admitting: Radiation Oncology

## 2014-07-30 ENCOUNTER — Telehealth: Payer: Self-pay | Admitting: Medical Oncology

## 2014-07-30 NOTE — Progress Notes (Signed)
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 Lelon Huh on 06/01/14 with an elevated PSA of 19.85.  Has a history of Bladder Cancer resected in 2000 by Dr. Maryland Pink.   Past/Anticipated interventions by urology, if any: Dr. 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  Nocturia 1-2. No straining.  Nausea/Vomiting, if any: None  Pain issues, if any:   SAFETY ISSUES:  Prior radiation? No   Pacemaker/ICD? No  Possible current pregnancy? N/A  Is the patient on methotrexate? No  Current Complaints / other details:

## 2014-07-30 NOTE — Telephone Encounter (Signed)
Oncology Nurse Navigator Documentation  Oncology Nurse Navigator Flowsheets 07/23/2014 07/23/2014 07/30/2014  Referral date to RadOnc/MedOnc 07/20/2014 - -  Navigator Encounter Type Introductory phone call Telephone Telephone- I left Matthew Adkins a reminder message for his appointment 07/31/14 arriving at 12:15. I asked him to bring back his completed medical forms and to have some lunch before he arrives. I asked him to call me back to confirm.  Treatment Phase - Other -  Time Spent with Patient 15 - (No Data)

## 2014-07-31 ENCOUNTER — Encounter: Payer: Self-pay | Admitting: Radiation Oncology

## 2014-07-31 ENCOUNTER — Ambulatory Visit
Admit: 2014-07-31 | Discharge: 2014-07-31 | Disposition: A | Discharge status: Home / Self Care | Payer: Medicare Other | Source: Ambulatory Visit | Attending: Radiation Oncology | Admitting: Radiation Oncology

## 2014-07-31 ENCOUNTER — Encounter: Payer: Self-pay | Admitting: Medical Oncology

## 2014-07-31 ENCOUNTER — Ambulatory Visit (HOSPITAL_BASED_OUTPATIENT_CLINIC_OR_DEPARTMENT_OTHER): Payer: Medicare Other | Admitting: Oncology

## 2014-07-31 ENCOUNTER — Encounter: Payer: Self-pay | Admitting: General Practice

## 2014-07-31 VITALS — BP 108/63 | HR 55 | Temp 97.8°F | Ht 75.0 in | Wt 190.5 lb

## 2014-07-31 DIAGNOSIS — C61 Malignant neoplasm of prostate: Secondary | ICD-10-CM | POA: Diagnosis not present

## 2014-07-31 HISTORY — DX: Malignant neoplasm of prostate: C61

## 2014-07-31 NOTE — Addendum Note (Signed)
Encounter addended by: Benn Moulder, RN on: 07/31/2014  6:37 PM<BR>     Documentation filed: Charges VN

## 2014-07-31 NOTE — Progress Notes (Signed)
Spiritual Care Note  Met with Mr Dockter in [facility] to introduce [facility] team/resources (including print materials and contact info).  Provided pastoral presence, reflective listening, and normalization of feelings.  Mr Ritchey reports good support and engaged readily in conversation; per pt, he likes to talk with people because he knows he may learn something from others.  He used opportunity to share and reflect on his story, verbalizing appreciation for conversation partner.  He is aware of ongoing chaplain availability for support, but please also page as needs arise.  Thank you.  Chaplain Lisa Lundeen, MDiv Pager [phone] Voicemail  [phone]  

## 2014-07-31 NOTE — Progress Notes (Unsigned)
                               Care Plan Summary  Name: Mr. Alaric Gladwin DOB: Jan 10, 1950   Your Medical Team:   Urologist -  Dr. Raynelle Bring, [facility]  Radiation Oncologist - Dr. Arloa Koh, [facility]   Medical Oncologist - Dr. Zola Button, [place]  Recommendations: 1) Surgery 2) Androgen Deprivation Therapy(Hormone Therapy) 3) Radiation Therapy  * These recommendations are based on information available as of today's consult.      Recommendations may change depending on the results of further tests or exams.    Next Steps: 1) Call Jabier Gauss or Dr. Alinda Money with your decision.    When appointments need to be scheduled, you will be contacted by [facility] and/or [facility].  Questions?  Please do not hesitate to call Cira Rue, RN, BSN, CRNI at [phone] any questions or concerns.  Shirlean Mylar is your Oncology Nurse Navigator and is available to assist you while you're receiving your medical care at [facility].

## 2014-07-31 NOTE — Progress Notes (Signed)
Please see consult note.  

## 2014-07-31 NOTE — Consult Note (Signed)
Chief Complaint  Prostate Cancer   Reason For Visit  Reason for consult: To discuss treatment options for prostate cancer and specifically to consider surgical therapy. Physician requesting consult: Dr. Irine Seal PCP: Dr. Moshe Cipro Location of consult: [facility]   History of Present Illness   Matthew Adkins is a 64 year old gentleman who was found to have an elevated PSA of 16.69 prompting a prostate needle biopsy by Dr. Jeffie Pollock on 07/06/14 that confirmed Gleason 4+3=7 adenocarcinoma of the prostate with 10 out of 12 biopsy cores positive for malignancy. He has no family history of prostate cancer.  A staging bone scan on 07/16/14 did not demonstrate findings worrisome for metastatic disease except for a small area of increased tracer uptake at the right acetabulum that were felt to be most likely degenerative changes.  In addition, other small sclerotic lesions were noted in the bony pelvis particular tore the right acetabulum where uptake was also noted on his bone scan.  However, evaluating his prior imaging from 2011 also demonstrated the sclerotic area and his right acetabulum which has since shown minimal if any growth indicating that this likely does not represent measurable cancer.    ** He does have a history of superficial bladder cancer diagnosed on TURBT in 2000 by Dr. [place] Pink.  He has not had recurrences.  Dr. Jeffie Pollock performed cystoscopy most recently on 07/06/14 without evidence of recurrence.  TNM stage: cT2b Nx M0 (induration along the right lateral border) PSA: 16.69 Gleason score: 4+3=7 Biopsy (07/06/14): 10/12 cores    Right: R lateral apex (5%, 3+3=6), R apex (10%, 3+3=6), R mid (5%, 3+3=6), R lateral base (90%, 3+4=7), R base (70%, 3+4=7)    Left: L lateral apex (20%, 3+4=7), L mid (90%, 3+4=7), L lateral mid (10%, 3+3=6), L base (70%, 4+3=7), L lateral base (50%, 3+4=7) Prostate volume: 23 cc  Nomogram OC disease: 11% EPE:  86% SVI: 31% LNI: 28% PFS (surgery): 34% at 5 years, 22% at 10 years  Urinary function: He has mild-to-moderate lower urinary tract symptoms.  IPSS is 9. Erectile function: He does have mild erectile dysfunction.  SHIM score is 18.  He can reliably obtain erections without medication.   Past Medical History  1. History of Anxiety (F41.9)  2. History of bladder cancer (Z85.51)  Surgical History  1. History of Bladder Surgery  2. History of Foot Surgery  3. History of Inguinal Hernia Repair  4. History of Knee Surgery  Current Meds  1. PROzac TABS;  Therapy: (Recorded:27May2016) to Recorded  2. Tamsulosin HCl - 0.4 MG Oral Capsule; TAKE 1 CAPSULE Daily;  Therapy: [phone] to (Evaluate:18Jun2017)  Requested for: 23Jun2016; Last  [id number] Ordered  Allergies  1. No Known Drug Allergies  Family History  1. No pertinent family history : Mother, Father  Social History   Alcohol use (Z78.9)   Current smoker (F17.200)   Married   Number of children   Retired  Review of Systems AU Complete-Male: Genitourinary, constitutional, skin, eye, otolaryngeal, hematologic/lymphatic, cardiovascular, pulmonary, endocrine, musculoskeletal, gastrointestinal, neurological and psychiatric system(s) were reviewed and pertinent findings if present are noted and are otherwise negative.    Physical Exam Constitutional: Well nourished and well developed . No acute distress.  ENT:. The ears and nose are normal in appearance.  Neck: The appearance of the neck is normal and no neck mass is present.  Pulmonary: No respiratory distress, normal respiratory rhythm and effort and clear bilateral breath sounds.  Cardiovascular: Heart rate and rhythm are normal . No peripheral edema.  Abdomen: The abdomen is soft and nontender. No masses are palpated. No CVA tenderness. No hernias are palpable. No hepatosplenomegaly noted.  Rectal: Prostate size is estimated to be 30 g. There is induration of the  right lateral prostate noted. No definite extraprostatic extension noted.  Lymphatics: The femoral and inguinal nodes are not enlarged or tender.  Skin: Normal skin turgor, no visible rash and no visible skin lesions.  Neuro/Psych:. Mood and affect are appropriate.    Results/Data  We have reviewed his medical records, PSA results, and pathology reports.  We have independently reviewed his imaging studies including his bone scan and CT scan as well as his historical imaging.  Findings are as dictated above.     Assessment  1. Prostate cancer (C61)  Discussion/Summary  Intermediate/high risk prostate cancer: I had a detailed discussion with Mr. Lipscomb today regarding his prostate cancer.  Fortunately, his risk for metastatic disease would appear to be relatively low based on review of his imaging studies.  However, he understands that we cannot absolutely rule out the presence of metastatic disease.  That being said, he is a candidate for therapy of curative intent with either primary surgery or primary radiation therapy.  Based on his discussions with Dr. Valere Dross, he will be offered IMRT with 2 years of androgen deprivation therapy.  He also is a candidate for a unilateral left nerve sparing robot-assisted laparoscopic radical prostatectomy and bilateral pelvic lymphadenectomy.  We have discussed the pros and cons of each of these approaches today in detail.   The patient was counseled about the natural history of prostate cancer and the standard treatment options that are available for prostate cancer. It was explained to him how his age and life expectancy, clinical stage, Gleason score, and PSA affect his prognosis, the decision to proceed with additional staging studies, as well as how that information influences recommended treatment strategies. We discussed the roles for active surveillance, radiation therapy, surgical therapy, androgen deprivation, as well as ablative therapy options for the  treatment of prostate cancer as appropriate to his individual cancer situation. We discussed the risks and benefits of these options with regard to their impact on cancer control and also in terms of potential adverse events, complications, and impact on quiality of life particularly related to urinary, bowel, and sexual function. The patient was encouraged to ask questions throughout the discussion today and all questions were answered to his stated satisfaction. In addition, the patient was provided with and/or directed to appropriate resources and literature for further education about prostate cancer and treatment options.   We discussed surgical therapy for prostate cancer including the different available surgical approaches. We discussed, in detail, the risks and expectations of surgery with regard to cancer control, urinary control, and erectile function as well as the expected postoperative recovery process. Additional risks of surgery including but not limited to bleeding, infection, hernia formation, nerve damage, lymphocele formation, bowel/rectal injury potentially necessitating colostomy, damage to the urinary tract resulting in urine leakage, urethral stricture, and the cardiopulmonary risks such as myocardial infarction, stroke, death, venothromboembolism, etc. were explained. The risk of open surgical conversion for robotic/laparoscopic prostatectomy was also discussed.   He will consider his options and notify me if he would like to proceed with surgical treatment.  My tentative plan would be to perform a unilateral left nerve sparing robot-assisted laparoscopic radical prostatectomy and pelvic lymphadenectomy.  Cc: Dr. Moshe Cipro Dr. Roxy Cedar  LNLGXQ Dr. Irine Seal Dr. Arloa Koh  A total of 55 minutes were spent in the overall care of the patient today with 45 minutes in direct face to face consultation.    Signatures Electronically signed by : Raynelle Bring, M.D.; Jul 31 2014  5:01PM  EST

## 2014-07-31 NOTE — Consult Note (Signed)
Reason for Referral: prostate cancer.  HPI: 64 year old gentleman currently of [place] where he lived the majority of his life.gentleman in reasonably good health and was found to have to have an elevated PSA up to 16.69 by his primary care provider. He subsequently was referred to Dr. Jeffie Pollock and subsequently under went a biopsy on 07/06/2014. His PSA showed a Gleason score 4+3 = 7 in 10 out of 12 cores. Staging workup including a bone scan showed no evidence of metastatic disease but slight abnormalities noted around the right acetabulum.CT scan of the abdomen and pelvis did not show any evidence of any lymphadenopathy and showed abnormalities around the right acetabulum that have been relatively chronic compared to previous CT scans. He was referred to the prostate cancer multidisciplinary clinic for discussion about treatment options. Clinically he reports his symptoms of lower urinary tract including nocturia and hesitancy that have been relieved by Flomax. He is not report any headaches or blurry vision or syncope. He does not report any fevers or chills or sweats. He does not report any back pain or shoulder pain or any bone pain. He does not report any nausea or vomiting or abdominal pain. He does not report any hematuria or dysuria. Remaining review of systems unremarkable.  Past Medical History  Diagnosis Date  . Nicotine addiction   . Seasonal allergies   . Bladder cancer 2000  . Prostate cancer 07/06/2014  :  Past Surgical History  Procedure Laterality Date  . Athroscopy of right knee  2002  . Foot surgery  2004  . Colonoscopy  MJ 2009 pmhX: POLYPS    POLYP REMOVED BUT NOT RETRIEVED  . Colonoscopy  LS 2007 ARS    ? POLYPS, no path available  . Cancerous polyps removed from bladder  2000  . Colonoscopy  09/26/2010    sessile poylp(2) pan-colnoic diverticulosis,mild/internal hemorrhoids  . Prostate biopsy  07/06/14  :   Current outpatient prescriptions:  .  aspirin  EC 81 MG tablet, Take 81 mg by mouth 3 (three) times a week. , Disp: , Rfl:  .  EPINEPHrine (EPIPEN) 0.3 mg/0.3 mL DEVI, USE AS DIRECTED IF FEELING ILL AFTER BEE STINGS., Disp: 1 Device, Rfl: 3 .  FLUoxetine (PROZAC) 10 MG capsule, TAKE 1 CAPSULE BY MOUTH DAILY., Disp: 30 capsule, Rfl: 3 .  montelukast (SINGULAIR) 10 MG tablet, Take 1 tablet (10 mg total) by mouth at bedtime. (Patient taking differently: Take 10 mg by mouth daily as needed (FOR ALLERGIES). ), Disp: 30 tablet, Rfl: 3 .  Multiple Vitamins-Minerals (CENTRUM PO), Take 1 tablet by mouth daily. , Disp: , Rfl:  .  Tamsulosin HCl (FLOMAX) 0.4 MG CAPS, Take 0.4 mg by mouth daily after supper. , Disp: , Rfl: :  Allergies  Allergen Reactions  . Bee Venom   . Codeine Nausea And Vomiting  :  Family History  Problem Relation Age of Onset  . Heart attack Father   . Diabetes Sister   . Hypertension Sister   . Aneurysm Sister     brain   . Arthritis      family history   . Colon cancer Neg Hx   . Colon polyps Neg Hx   :  History   Social History  . Marital Status: Married    Spouse Name: N/A  . Number of Children: 2  . Years of Education: N/A   Occupational History  . unemployed     Social History Main Topics  . Smoking status:  Current Every Day Smoker -- 0.50 packs/day for 30 years    Types: Cigarettes  . Smokeless tobacco: Not on file  . Alcohol Use: Yes     Comment: pint/month  . Drug Use: Yes    Special: Marijuana  . Sexual Activity: Not on file   Other Topics Concern  . Not on file   Social History Narrative  :  Pertinent items are noted in HPI.  Exam: ECOG 0 General appearance: alert and cooperative Head: Normocephalic, without obvious abnormality Nose: Nares normal. Septum midline. Mucosa normal. No drainage or sinus tenderness. Throat: lips, mucosa, and tongue normal; teeth and gums normal Neck: no adenopathy Back: negative Resp: clear to auscultation bilaterally Cardio: regular rate and rhythm,  S1, S2 normal, no murmur, click, rub or gallop GI: soft, non-tender; bowel sounds normal; no masses,  no organomegaly Extremities: extremities normal, atraumatic, no cyanosis or edema Pulses: 2+ and symmetric Lymph nodes: Cervical, supraclavicular, and axillary nodes normal.     Ct Pelvis W Contrast  07/18/2014   CLINICAL DATA:  Elevated PSA, positive prostate biopsies.  EXAM: CT PELVIS WITH CONTRAST  TECHNIQUE: Multidetector CT imaging of the pelvis was performed using the standard protocol following the bolus administration of intravenous contrast.  CONTRAST:  161mL OMNIPAQUE IOHEXOL 300 MG/ML  SOLN  COMPARISON:  Bone scan 07/16/2014.  CT abdomen pelvis 12/17/2009.  FINDINGS: Prostate is grossly normal in size and fairly uniform in attenuation. No contour nodularity or irregularity. No adenopathy. Scattered lymph nodes along the iliac chains are sub cm in short axis size.  Visualized portions of the small bowel and colon are unremarkable. Bladder wall is thickened but the bladder is under distended. Atherosclerotic calcification of the arterial vasculature. Right common iliac artery measures 1.6 cm and left common iliac artery, 1.9 cm.  A 6 mm sclerotic lesion in lateral right iliac wing (series 2, image 11) may have been millimetric on the prior exam. Additional scattered sclerotic lesions in the pelvis are unchanged. Partial osseous bridging of the sacroiliac joints.  IMPRESSION: 1. No metastatic adenopathy. 2. Sclerotic lesion in the lateral right iliac wing was likely present, but smaller, on 12/17/2009. Attention on followup exams is warranted. 3. Cannot exclude bladder wall thickening.   Electronically Signed   By: Lorin Picket M.D.   On: 07/18/2014 15:59   Nm Bone Scan Whole Body  07/16/2014   CLINICAL DATA:  New diagnosis of prostate malignancy, no bone pain, no history of fractures  EXAM: NUCLEAR MEDICINE WHOLE BODY BONE SCAN  TECHNIQUE: Whole body anterior and posterior images were  obtained approximately 3 hours after intravenous injection of radiopharmaceutical.  RADIOPHARMACEUTICALS:  Twenty-seven mCi Technetium-9m MDP IV  COMPARISON:  None  FINDINGS: There is adequate uptake of the radiopharmaceutical by the skeleton. Adequate soft tissue clearance and renal activity is observed. Motion artifact limits evaluation of the calvarium uptake over the spine, pelvis, and ribs is normal. A focus of increased uptake is noted along the superior margin of the acetabulum on the right most compatible with degenerative change. Mildly increased uptake is noted about the shoulders and elbows and wrists consistent with degenerative change. Mildly increased uptake associated with the knees and feet is thus consistent with degenerative change.  IMPRESSION: No abnormal uptake is demonstrated to suggest metastatic disease. A plain film series of the right hip would be useful to confirm presumed degenerative change along the superior margin of the acetabulum.   Electronically Signed   By: David  Martinique M.D.  On: 07/16/2014 13:54    Assessment and Plan:    64 year old gentleman with the prostate cancer diagnosed in July 2016. He presented with a Gleason score of 16.69 and a Gleason score of 4+3 = 7 in 10 out of 12 cores. His pathology was discussed with the reviewing pathologist as a part of the prostate cancer clinic. Imaging studies also reviewed by radiology. The sclerotic lesions in the pelvis compared to previous imaging studies did not show any changes indicating likely benign findings.  Treatment options were discussed with the patient today including radical prostatectomy versus radiation therapy with 2 years of antigen deprivation given the high-risk features associated with his cancer that includes high PSA, height Gleason score and volume of disease.  Risks and benefits of both approaches approaches were reviewed and the logistics of antigen deprivation were discussed. Complications  associated with this therapy. Includes weight gain, hot flashes, erectile dysfunction, cognitive decline among others. He'll consider his options and make up his mind in the near future. He is leaning toward surgery but certainly can change after further discussion with Dr. Alinda Money and Dr. Valere Dross.

## 2014-07-31 NOTE — Progress Notes (Signed)
[facility] NEW PATIENT EVALUATION  Name: Matthew Adkins MRN: [id number]  Date:   07/31/2014           DOB: [birth year]  Status: outpatient   CC: [name], MD  Raynelle Bring, MD    REFERRING PHYSICIAN: Raynelle Bring, MD   DIAGNOSIS: Stage T2b high-risk adenocarcinoma the prostate   HISTORY OF PRESENT ILLNESS:  Matthew Adkins is a 64 y.o. male who is  seen today through the courtesy Dr. Alinda Money at the prostate multidisciplinary clinic for evaluation of his stage T2b high-risk adenocarcinoma prostate.  His PSA was 3.4 approximately 4 years ago.  This past May his PSA rose to 19.85.  A repeat PSA on 06/06/2014 was 16.69.  He was seen by Dr. Jeffie Pollock who performed ultrasound-guided biopsies on 07/06/2014.  He was found to have 10 of 12 biopsies diagnostic for adenocarcinoma.  He was found have Gleason 7 (4+3) involving 70% of one core from the left base.  He had Gleason 7 (3+4) involving 90% of one core from the right lateral base, 70% of one core from the right base, 50% of one core from the left lateral base, 90% of one core from the left mid gland and 20% of one core from the left lateral apex.  He had Gleason 6 (3+3) involving 5% of one core from the right mid gland, less than 5% of one core from the right lateral apex, 10% of one core from the right apex, and 10% of one core from the left lateral mid gland.  His gland volume was 23 mL.  He is doing well from a GU and GI standpoint.  His I PSS score is 9.  He is been on tamsulosin for the past 2 years.  He does have mild erectile dysfunction.  Of note is that he did have excision of a superficial bladder cancer back in 2000 with Dr. Maryland Pink.  His staging workup included a bone scan and also a CT scan of the pelvis.  Scans through the pelvis showed small sclerotic foci representing either metastatic disease or small bone islands.  When compared to a CT scan from 2011, there was no significant change , and thus  these are not felt to represent metastatic disease.  A bone scan showed uptake along the right acetabulum, probably degenerative but really no significant change seen on his CT scan, thus we do not feel that this represents metastatic disease.  PREVIOUS RADIATION THERAPY: No   PAST MEDICAL HISTORY:  has a past medical history of Nicotine addiction; Seasonal allergies; Bladder cancer (2000); and Prostate cancer (07/06/2014).     PAST SURGICAL HISTORY:  Past Surgical History  Procedure Laterality Date  . Athroscopy of right knee  2002  . Foot surgery  2004  . Colonoscopy  MJ 2009 pmhX: POLYPS    POLYP REMOVED BUT NOT RETRIEVED  . Colonoscopy  LS 2007 ARS    ? POLYPS, no path available  . Cancerous polyps removed from bladder  2000  . Colonoscopy  09/26/2010    sessile poylp(2) pan-colnoic diverticulosis,mild/internal hemorrhoids  . Prostate biopsy  07/06/14     FAMILY HISTORY: family history includes Aneurysm in his sister; Arthritis in an other family member; Diabetes in his sister; Heart attack in his father; Hypertension in his sister. There is no history of Colon cancer or Colon polyps.  His father died of a heart attack at 7.  His mother died from a brain tumor or  metastatic disease to the brain is 60.  No family history of prostate cancer.     SOCIAL HISTORY:  reports that he has been smoking Cigarettes.  He has a 15 pack-year smoking history. He does not have any smokeless tobacco history on file. He reports that he drinks alcohol. He reports that he uses illicit drugs (Marijuana). Married, 2 children.  Retired  Chiropodist.   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     ALLERGIES: Bee venom and Codeine   MEDICATIONS:  Current Outpatient Prescriptions  Medication Sig Dispense Refill  . aspirin EC 81 MG tablet Take 81 mg by mouth 3 (three) times a week.     Marland Kitchen EPINEPHrine (EPIPEN) 0.3 mg/0.3 mL DEVI USE AS DIRECTED IF FEELING ILL AFTER BEE STINGS. 1 Device 3   . FLUoxetine (PROZAC) 10 MG capsule TAKE 1 CAPSULE BY MOUTH DAILY. 30 capsule 3  . montelukast (SINGULAIR) 10 MG tablet Take 1 tablet (10 mg total) by mouth at bedtime. (Patient taking differently: Take 10 mg by mouth daily as needed (FOR ALLERGIES). ) 30 tablet 3  . Multiple Vitamins-Minerals (CENTRUM PO) Take 1 tablet by mouth daily.     . Tamsulosin HCl (FLOMAX) 0.4 MG CAPS Take 0.4 mg by mouth daily after supper.      No current facility-administered medications for this encounter.     REVIEW OF SYSTEMS:  Pertinent items are noted in HPI.    PHYSICAL EXAM:  height is 6\' 3"  (1.905 m) and weight is 190 lb 8 oz (86.41 kg). His temperature is 97.8 F (36.6 C). His blood pressure is 108/63 and his pulse is 55.   Alert and oriented 64 year old African-American male appearing younger than his stated age.  Head and neck examination: Grossly unremarkable.  Rectal: The prostate gland is normal in size and is without focal induration except  along the right lateral border as noted by Dr.Borden. I feel that this is subtle.     LABORATORY DATA:  Lab Results  Component Value Date   WBC 5.0 07/20/2014   HGB 14.4 07/20/2014   HCT 43.8 07/20/2014   MCV 93.4 07/20/2014   PLT 162 07/20/2014   Lab Results  Component Value Date   NA 140 07/20/2014   K 4.2 07/20/2014   CL 105 07/20/2014   CO2 28 07/20/2014   Lab Results  Component Value Date   ALT 14* 07/20/2014   AST 15 07/20/2014   ALKPHOS 47 07/20/2014   BILITOT 0.8 07/20/2014    PSA 16.69 from 06/06/2014    IMPRESSION: Stage T2b high-risk adenocarcinoma prostate.  I explained to the patient that his prognosis is related to his stage, Gleason score, and PSA level.  His Gleason score of 7 along with a PSA over 10 places him in the high risk disease category.  Management options include surgery versus radiation therapy.  I favor external beam/IMRT over 5 weeks of external beam followed by a seed implant boost.  We discussed the potential  acute and late toxicities of external beam/IMR T.  We discussed the need for placement of 3 gold seed markers for image guidance.  We discussed bladder filling to minimize urinary toxicity.  We also discussed androgen deprivation therapy which ideally should be given for a total of 2 years, this has been shown to improve survival in patients undergoing external beam radiation therapy.  We discussed the side effects of androgen deprivation therapy including hot flashes fatigue, and loss of sex  drive.     PLAN: The patient will think things over and get back in touch with me if he wants to consider radiation therapy.  Otherwise, he will proceed with surgery with Dr. Alinda Money.  I spent 30  minutes face to face with the patient and more than 50% of that time was spent in counseling and/or coordination of care.

## 2014-08-06 ENCOUNTER — Encounter: Payer: Self-pay | Admitting: *Deleted

## 2014-08-06 NOTE — Progress Notes (Signed)
[facility] Psychosocial Distress Screening Clinical Social Work  Clinical Social Work was referred by distress screening protocol. The patient scored a 5 on the Psychosocial Distress Thermometer which indicates moderate distress. Patient was seen and assessed by [facility] on the same day as screen was completed. [facility] addressed patients needs and concerns and provided appropriate interventions.   ONCBCN DISTRESS SCREENING 07/31/2014  Screening Type Initial Screening  Distress experienced in past week (1-10) 5  Emotional problem type Adjusting to illness  Information Concerns Type Lack of info about diagnosis;Lack of info about treatment   Johnnye Lana, MSW, LCSW, OSW-C Clinical Social Worker [facility] [phone]

## 2014-08-09 ENCOUNTER — Telehealth: Payer: Self-pay | Admitting: Medical Oncology

## 2014-08-09 NOTE — Telephone Encounter (Signed)
Oncology Nurse Navigator Documentation  Oncology Nurse Navigator Flowsheets 07/30/2014 07/31/2014 08/09/2014  Referral date to RadOnc/MedOnc - - -  Navigator Encounter Type Telephone Clinic/MDC Telephone- I followed up with Matthew Adkins post Prostate [place]. He states that he has made his decision to do surgery for his prostate cancer. I informed him that I will notify Dr. Alinda Money and his office will contact him with a date. I asked him to call me with any questions or concerns and he voiced understanding.  Treatment Phase - - -  Barriers/Navigation Needs - - No barriers at this time  Interventions - - Coordination of Care  Support Groups/Services - - Friends and Family  Time Spent with Patient (No Data) 30 15

## 2014-08-13 ENCOUNTER — Other Ambulatory Visit: Payer: Self-pay | Admitting: Urology

## 2014-08-14 DIAGNOSIS — C61 Malignant neoplasm of prostate: Secondary | ICD-10-CM | POA: Diagnosis not present

## 2014-08-14 DIAGNOSIS — M6281 Muscle weakness (generalized): Secondary | ICD-10-CM | POA: Diagnosis not present

## 2014-08-21 DIAGNOSIS — M6281 Muscle weakness (generalized): Secondary | ICD-10-CM | POA: Diagnosis not present

## 2014-08-21 DIAGNOSIS — C61 Malignant neoplasm of prostate: Secondary | ICD-10-CM | POA: Diagnosis not present

## 2014-08-22 DIAGNOSIS — E785 Hyperlipidemia, unspecified: Secondary | ICD-10-CM | POA: Diagnosis not present

## 2014-08-22 DIAGNOSIS — R7309 Other abnormal glucose: Secondary | ICD-10-CM | POA: Diagnosis not present

## 2014-08-23 LAB — BASIC METABOLIC PANEL WITH GFR
BUN: 14 mg/dL (ref 7–25)
CHLORIDE: 106 mmol/L (ref 98–110)
CO2: 26 mmol/L (ref 20–31)
CREATININE: 0.94 mg/dL (ref 0.70–1.25)
Calcium: 8.9 mg/dL (ref 8.6–10.3)
GFR, Est African American: >89 mL/min (ref >=60)
GFR, Est Non African American: 85 mL/min (ref >=60)
Glucose, Bld: 89 mg/dL (ref 65–99)
Potassium: 4.3 mmol/L (ref 3.5–5.3)
Sodium: 140 mmol/L (ref 135–146)

## 2014-08-23 LAB — LDL CHOLESTEROL, DIRECT: Direct LDL: 105 mg/dL (ref <130)

## 2014-08-23 LAB — HEMOGLOBIN A1C
HEMOGLOBIN A1C: 6 % — AB (ref <5.7)
MEAN PLASMA GLUCOSE: 126 mg/dL — AB (ref <117)

## 2014-08-27 ENCOUNTER — Ambulatory Visit: Payer: Medicare Other | Admitting: Family Medicine

## 2014-08-27 ENCOUNTER — Encounter: Payer: Self-pay | Admitting: *Deleted

## 2014-08-28 ENCOUNTER — Telehealth: Payer: Self-pay | Admitting: *Deleted

## 2014-08-28 NOTE — Telephone Encounter (Signed)
Patient aware of labs.  

## 2014-08-28 NOTE — Telephone Encounter (Signed)
Pt called stating he did not mean to miss his appt yesterday he thought it was today, pt wants to know if his blood work results are back and pt stated he is having surgery Monday on his prostate and he will call back to reschedule his appt after the surgery

## 2014-08-28 NOTE — Patient Instructions (Addendum)
Matthew Adkins  08/28/2014   Your procedure is scheduled on: Monday 09/03/2014  Report to [facility] Main  Entrance take [place]  elevators to 3rd floor to  [place] at  [place] AM.  Call this number if you have problems the morning of surgery [phone]   Remember: ONLY 1 PERSON MAY GO WITH YOU TO SHORT STAY TO GET  READY MORNING OF [place].                        FOLLOW BOWEL PREP INSTRUCTIONS FROM DR. BORDEN'S OFFICE FOR THE DAY BEFORE SURGERY! FOLLOW CLEAR LIQUID DIET DAY BEFORE SURGERY ALL DAY ON Sunday 09/02/2014   Do not eat food or drink liquids :After Midnight.     Take these medicines the morning of surgery with A SIP OF WATER: PROZAC                               You may not have any metal on your body including hair pins and              piercings  Do not wear jewelry, make-up, lotions, powders or perfumes, deodorant             Do not wear nail polish.  Do not shave  48 hours prior to surgery.              Men may shave face and neck.   Do not bring valuables to the hospital. [place].  Contacts, dentures or bridgework may not be worn into surgery.  Leave suitcase in the car. After surgery it may be brought to your room.     Patients discharged the day of surgery will not be allowed to drive home.  Name and phone number of your driver:  Special Instructions: N/A              Please read over the following fact sheets you were given: _____________________________________________________________________             [facility] - Preparing for Surgery Before surgery, you can play an important role.  Because skin is not sterile, your skin needs to be as free of germs as possible.  You can reduce the number of germs on your skin by washing with CHG (chlorahexidine gluconate) soap before surgery.  CHG is an antiseptic cleaner which kills germs and bonds with the skin to continue killing  germs even after washing. Please DO NOT use if you have an allergy to CHG or antibacterial soaps.  If your skin becomes reddened/irritated stop using the CHG and inform your nurse when you arrive at Short Stay. Do not shave (including legs and underarms) for at least 48 hours prior to the first CHG shower.  You may shave your face/neck. Please follow these instructions carefully:  1.  Shower with CHG Soap the night before surgery and the  morning of Surgery.  2.  If you choose to wash your hair, wash your hair first as usual with your  normal  shampoo.  3.  After you shampoo, rinse your hair and body thoroughly to remove the  shampoo.  4.  Use CHG as you would any other liquid soap.  You can apply chg directly  to the skin and wash                       Gently with a scrungie or clean washcloth.  5.  Apply the CHG Soap to your body ONLY FROM THE NECK DOWN.   Do not use on face/ open                           Wound or open sores. Avoid contact with eyes, ears mouth and genitals (private parts).                       Wash face,  Genitals (private parts) with your normal soap.             6.  Wash thoroughly, paying special attention to the area where your surgery  will be performed.  7.  Thoroughly rinse your body with warm water from the neck down.  8.  DO NOT shower/wash with your normal soap after using and rinsing off  the CHG Soap.                9.  Pat yourself dry with a clean towel.            10.  Wear clean pajamas.            11.  Place clean sheets on your bed the night of your first shower and do not  sleep with pets. Day of Surgery : Do not apply any lotions/deodorants the morning of surgery.  Please wear clean clothes to the hospital/surgery center.  FAILURE TO FOLLOW THESE INSTRUCTIONS MAY RESULT IN THE CANCELLATION OF YOUR SURGERY PATIENT SIGNATURE_________________________________  NURSE  SIGNATURE__________________________________  ________________________________________________________________________   Matthew Adkins  An incentive spirometer is a tool that can help keep your lungs clear and active. This tool measures how well you are filling your lungs with each breath. Taking long deep breaths may help reverse or decrease the chance of developing breathing (pulmonary) problems (especially infection) following:  A long period of time when you are unable to move or be active. BEFORE THE PROCEDURE   If the spirometer includes an indicator to show your best effort, your nurse or respiratory therapist will set it to a desired goal.  If possible, sit up straight or lean slightly forward. Try not to slouch.  Hold the incentive spirometer in an upright position. INSTRUCTIONS FOR USE   Sit on the edge of your bed if possible, or sit up as far as you can in bed or on a chair.  Hold the incentive spirometer in an upright position.  Breathe out normally.  Place the mouthpiece in your mouth and seal your lips tightly around it.  Breathe in slowly and as deeply as possible, raising the piston or the ball toward the top of the column.  Hold your breath for 3-5 seconds or for as long as possible. Allow the piston or ball to fall to the bottom of the column.  Remove the mouthpiece from your mouth and breathe out normally.  Rest for a few seconds and repeat Steps 1 through 7 at least 10 times every 1-2 hours when you are awake. Take your time and take a few normal breaths between deep breaths.  The spirometer may include an indicator to show  your best effort. Use the indicator as a goal to work toward during each repetition.  After each set of 10 deep breaths, practice coughing to be sure your lungs are clear. If you have an incision (the cut made at the time of surgery), support your incision when coughing by placing a pillow or rolled up towels firmly against it. Once  you are able to get out of bed, walk around indoors and cough well. You may stop using the incentive spirometer when instructed by your caregiver.  RISKS AND COMPLICATIONS  Take your time so you do not get dizzy or light-headed.  If you are in pain, you may need to take or ask for pain medication before doing incentive spirometry. It is harder to take a deep breath if you are having pain. AFTER USE  Rest and breathe slowly and easily.  It can be helpful to keep track of a log of your progress. Your caregiver can provide you with a simple table to help with this. If you are using the spirometer at home, follow these instructions: [place] IF:   You are having difficultly using the spirometer.  You have trouble using the spirometer as often as instructed.  Your pain medication is not giving enough relief while using the spirometer.  You develop fever of 100.5 F (38.1 C) or higher. SEEK IMMEDIATE MEDICAL CARE IF:   You cough up bloody sputum that had not been present before.  You develop fever of 102 F (38.9 C) or greater.  You develop worsening pain at or near the incision site. MAKE SURE YOU:   Understand these instructions.  Will watch your condition.  Will get help right away if you are not doing well or get worse. Document Released: 05/04/2006 Document Revised: 03/16/2011 Document Reviewed: 07/05/2006 ExitCare Patient Information 2014 ExitCare, [place].   ________________________________________________________________________  WHAT IS A BLOOD TRANSFUSION? Blood Transfusion Information  A transfusion is the replacement of blood or some of its parts. Blood is made up of multiple cells which provide different functions.  Red blood cells carry oxygen and are used for blood loss replacement.  White blood cells fight against infection.  Platelets control bleeding.  Plasma helps clot blood.  Other blood products are available for specialized needs, such as  hemophilia or other clotting disorders. BEFORE THE TRANSFUSION  Who gives blood for transfusions?   Healthy volunteers who are fully evaluated to make sure their blood is safe. This is blood bank blood. Transfusion therapy is the safest it has ever been in the practice of medicine. Before blood is taken from a donor, a complete history is taken to make sure that person has no history of diseases nor engages in risky social behavior (examples are intravenous drug use or sexual activity with multiple partners). The donor's travel history is screened to minimize risk of transmitting infections, such as malaria. The donated blood is tested for signs of infectious diseases, such as HIV and hepatitis. The blood is then tested to be sure it is compatible with you in order to minimize the chance of a transfusion reaction. If you or a relative donates blood, this is often done in anticipation of surgery and is not appropriate for emergency situations. It takes many days to process the donated blood. RISKS AND COMPLICATIONS Although transfusion therapy is very safe and saves many lives, the main dangers of transfusion include:   Getting an infectious disease.  Developing a transfusion reaction. This is an allergic reaction to  something in the blood you were given. Every precaution is taken to prevent this. The decision to have a blood transfusion has been considered carefully by your caregiver before blood is given. Blood is not given unless the benefits outweigh the risks. AFTER THE TRANSFUSION  Right after receiving a blood transfusion, you will usually feel much better and more energetic. This is especially true if your red blood cells have gotten low (anemic). The transfusion raises the level of the red blood cells which carry oxygen, and this usually causes an energy increase.  The nurse administering the transfusion will monitor you carefully for complications. HOME CARE INSTRUCTIONS  No special  instructions are needed after a transfusion. You may find your energy is better. Speak with your caregiver about any limitations on activity for underlying diseases you may have. SEEK MEDICAL CARE IF:   Your condition is not improving after your transfusion.  You develop redness or irritation at the intravenous (IV) site. SEEK IMMEDIATE MEDICAL CARE IF:  Any of the following symptoms occur over the next 12 hours:  Shaking chills.  You have a temperature by mouth above 102 F (38.9 C), not controlled by medicine.  Chest, back, or muscle pain.  People around you feel you are not acting correctly or are confused.  Shortness of breath or difficulty breathing.  Dizziness and fainting.  You get a rash or develop hives.  You have a decrease in urine output.  Your urine turns a dark color or changes to pink, red, or brown. Any of the following symptoms occur over the next 10 days:  You have a temperature by mouth above 102 F (38.9 C), not controlled by medicine.  Shortness of breath.  Weakness after normal activity.  The white part of the eye turns yellow (jaundice).  You have a decrease in the amount of urine or are urinating less often.  Your urine turns a dark color or changes to pink, red, or brown. Document Released: 12/20/1999 Document Revised: 03/16/2011 Document Reviewed: 08/08/2007 ExitCare Patient Information 2014 ExitCare, [place].  _______________________________________________________________________   CLEAR LIQUID DIET   Foods Allowed                                                                     Foods Excluded  Coffee and tea, regular and decaf                             liquids that you cannot  Plain Jell-O in any flavor                                             see through such as: Fruit ices (not with fruit pulp)                                     milk, soups, orange juice  Iced Popsicles  All solid  food Carbonated beverages, regular and diet                                    Cranberry, grape and apple juices Sports drinks like Gatorade Lightly seasoned clear broth or consume(fat free) Sugar, honey syrup  Sample Menu Breakfast                                Lunch                                     Supper Cranberry juice                    Beef broth                            Chicken broth Jell-O                                     Grape juice                           Apple juice Coffee or tea                        Jell-O                                      Popsicle                                                Coffee or tea                        Coffee or tea  _____________________________________________________________________

## 2014-08-30 ENCOUNTER — Encounter (HOSPITAL_COMMUNITY): Payer: Self-pay

## 2014-08-30 ENCOUNTER — Encounter (HOSPITAL_COMMUNITY)
Admission: RE | Admit: 2014-08-30 | Discharge: 2014-08-30 | Disposition: A | Discharge status: Home / Self Care | Payer: Medicare Other | Source: Ambulatory Visit | Attending: Urology | Admitting: Urology

## 2014-08-30 DIAGNOSIS — C61 Malignant neoplasm of prostate: Secondary | ICD-10-CM | POA: Insufficient documentation

## 2014-08-30 DIAGNOSIS — Z01818 Encounter for other preprocedural examination: Secondary | ICD-10-CM | POA: Diagnosis not present

## 2014-08-30 HISTORY — DX: Depression, unspecified: F32.A

## 2014-08-30 HISTORY — DX: Major depressive disorder, single episode, unspecified: F32.9

## 2014-08-30 LAB — BASIC METABOLIC PANEL
ANION GAP: 4 — AB (ref 5–15)
BUN: 10 mg/dL (ref 6–20)
CHLORIDE: 107 mmol/L (ref 101–111)
CO2: 28 mmol/L (ref 22–32)
CREATININE: 0.99 mg/dL (ref 0.61–1.24)
Calcium: 9 mg/dL (ref 8.9–10.3)
GFR calc non Af Amer: >60 mL/min (ref >60)
Glucose, Bld: 98 mg/dL (ref 65–99)
POTASSIUM: 4.3 mmol/L (ref 3.5–5.1)
SODIUM: 139 mmol/L (ref 135–145)

## 2014-08-30 LAB — CBC
HEMATOCRIT: 45.1 % (ref 39.0–52.0)
HEMOGLOBIN: 15 g/dL (ref 13.0–17.0)
MCH: 31 pg (ref 26.0–34.0)
MCHC: 33.3 g/dL (ref 30.0–36.0)
MCV: 93.2 fL (ref 78.0–100.0)
PLATELETS: 214 10*3/uL (ref 150–400)
RBC: 4.84 MIL/uL (ref 4.22–5.81)
RDW: 13.8 % (ref 11.5–15.5)
WBC: 4.4 10*3/uL (ref 4.0–10.5)

## 2014-08-30 LAB — ABO/RH: ABO/RH(D): O POS

## 2014-08-30 NOTE — Progress Notes (Signed)
   08/30/14 1419  OBSTRUCTIVE SLEEP APNEA  Have you ever been diagnosed with sleep apnea through a sleep study? No  Do you snore loudly (loud enough to be heard through closed doors)?  1  Do you often feel tired, fatigued, or sleepy during the daytime? 0  Has anyone observed you stop breathing during your sleep? 0  Do you have, or are you being treated for high blood pressure? 0  BMI more than 35 kg/m2? 0  Age over 66 years old? 1  Neck circumference greater than 40 cm/16 inches? 1  Gender: 1

## 2014-08-31 NOTE — H&P (Signed)
Chief Complaint  Prostate Cancer    History of Present Illness   Matthew Adkins is a 64 year old gentleman who was found to have an elevated PSA of 16.69 prompting a prostate needle biopsy by Dr. Jeffie Pollock on 07/06/14 that confirmed Gleason 4+3=7 adenocarcinoma of the prostate with 10 out of 12 biopsy cores positive for malignancy. He has no family history of prostate cancer.  A staging bone scan on 07/16/14 did not demonstrate findings worrisome for metastatic disease except for a small area of increased tracer uptake at the right acetabulum that were felt to be most likely degenerative changes.  In addition, other small sclerotic lesions were noted in the bony pelvis particular tore the right acetabulum where uptake was also noted on his bone scan.  However, evaluating his prior imaging from 2011 also demonstrated the sclerotic area and his right acetabulum which has since shown minimal if any growth indicating that this likely does not represent measurable cancer.    ** He does have a history of superficial bladder cancer diagnosed on TURBT in 2000 by Dr. [place] Pink.  He has not had recurrences.  Dr. Jeffie Pollock performed cystoscopy most recently on 07/06/14 without evidence of recurrence.  TNM stage: cT2b Nx M0 (induration along the right lateral border) PSA: 16.69 Gleason score: 4+3=7 Biopsy (07/06/14): 10/12 cores    Right: R lateral apex (5%, 3+3=6), R apex (10%, 3+3=6), R mid (5%, 3+3=6), R lateral base (90%, 3+4=7), R base (70%, 3+4=7)    Left: L lateral apex (20%, 3+4=7), L mid (90%, 3+4=7), L lateral mid (10%, 3+3=6), L base (70%, 4+3=7), L lateral base (50%, 3+4=7) Prostate volume: 23 cc  Nomogram OC disease: 11% EPE: 86% SVI: 31% LNI: 28% PFS (surgery): 34% at 3 years, 22% at 8 years  Urinary function: He has mild-to-moderate lower urinary tract symptoms.  IPSS is 9. Erectile function: He does have mild erectile dysfunction.  SHIM score is 18.  He can reliably obtain erections without medication.    Past Medical History  1. History of Anxiety (F41.9)  2. History of bladder cancer (Z85.51)  Surgical History  1. History of Bladder Surgery  2. History of Foot Surgery  3. History of Inguinal Hernia Repair  4. History of Knee Surgery  Current Meds  1. PROzac TABS;  Therapy: (Recorded:27May2016) to Recorded  2. Tamsulosin HCl - 0.4 MG Oral Capsule; TAKE 1 CAPSULE Daily;  Therapy: [phone] to (Evaluate:18Jun2017)  Requested for: 23Jun2016; Last  [id number] Ordered  Allergies  1. No Known Drug Allergies  Family History  1. No pertinent family history : Mother, Father  Social History   Alcohol use (Z78.9)   Current smoker (F17.200)   Married   Number of children   Retired  Review of Systems AU Complete-Male: Genitourinary, constitutional, skin, eye, otolaryngeal, hematologic/lymphatic, cardiovascular, pulmonary, endocrine, musculoskeletal, gastrointestinal, neurological and psychiatric system(s) were reviewed and pertinent findings if present are noted and are otherwise negative.    Physical Exam Constitutional: Well nourished and well developed . No acute distress.  ENT:. The ears and nose are normal in appearance.  Neck: The appearance of the neck is normal and no neck mass is present.  Pulmonary: No respiratory distress, normal respiratory rhythm and effort and clear bilateral breath sounds.  Cardiovascular: Heart rate and rhythm are normal . No peripheral edema.  Abdomen: The abdomen is soft and nontender. No masses are palpated. No CVA tenderness. No hernias are palpable. No hepatosplenomegaly noted.     Assessment  1. Prostate  cancer (C61)  Discussion/Summary  Intermediate/high risk prostate cancer:He has elected to undergo surgical therapy with a unilateral left nerve sparing robot-assisted laparoscopic radical prostatectomy and pelvic lymphadenectomy.

## 2014-09-02 NOTE — Anesthesia Preprocedure Evaluation (Addendum)
Anesthesia Evaluation  Patient identified by MRN, date of birth, ID band Patient awake    Reviewed: Allergy & Precautions, Patient's Chart, lab work & pertinent test results  History of Anesthesia Complications Negative for: history of anesthetic complications  Airway Mallampati: II  TM Distance: >3 FB Neck ROM: Full    Dental  (+) Teeth Intact, Dental Advisory Given   Pulmonary Current Smoker,    Pulmonary exam normal       Cardiovascular negative cardio ROS  Rhythm:Regular Rate:Normal     Neuro/Psych PSYCHIATRIC DISORDERS Depression negative neurological ROS     GI/Hepatic negative GI ROS, Neg liver ROS,   Endo/Other  negative endocrine ROS  Renal/GU negative Renal ROS     Musculoskeletal   Abdominal   Peds  Hematology   Anesthesia Other Findings   Reproductive/Obstetrics                           Anesthesia Physical Anesthesia Plan  ASA: II  Anesthesia Plan: General   Post-op Pain Management:    Induction: Intravenous  Airway Management Planned: Oral ETT  Additional Equipment:   Intra-op Plan:   Post-operative Plan: Extubation in OR  Informed Consent: I have reviewed the patients History and Physical, chart, labs and discussed the procedure including the risks, benefits and alternatives for the proposed anesthesia with the patient or authorized representative who has indicated his/her understanding and acceptance.   Dental advisory given  Plan Discussed with: CRNA, Anesthesiologist and Surgeon  Anesthesia Plan Comments:        Anesthesia Quick Evaluation

## 2014-09-03 ENCOUNTER — Encounter (HOSPITAL_COMMUNITY)
Admission: RE | Disposition: A | Discharge status: Home / Self Care | Payer: Self-pay | Source: Ambulatory Visit | Attending: Urology

## 2014-09-03 ENCOUNTER — Encounter (HOSPITAL_COMMUNITY): Payer: Self-pay | Admitting: *Deleted

## 2014-09-03 ENCOUNTER — Inpatient Hospital Stay (HOSPITAL_COMMUNITY): Payer: Medicare Other | Admitting: Anesthesiology

## 2014-09-03 ENCOUNTER — Inpatient Hospital Stay (HOSPITAL_COMMUNITY)
Admission: RE | Admit: 2014-09-03 | Discharge: 2014-09-04 | DRG: 708 | Disposition: A | Discharge status: Home / Self Care | Payer: Medicare Other | Source: Ambulatory Visit | Attending: Urology | Admitting: Urology

## 2014-09-03 DIAGNOSIS — C61 Malignant neoplasm of prostate: Principal | ICD-10-CM | POA: Diagnosis present

## 2014-09-03 DIAGNOSIS — Z8551 Personal history of malignant neoplasm of bladder: Secondary | ICD-10-CM | POA: Diagnosis not present

## 2014-09-03 DIAGNOSIS — Z01812 Encounter for preprocedural laboratory examination: Secondary | ICD-10-CM

## 2014-09-03 DIAGNOSIS — F17200 Nicotine dependence, unspecified, uncomplicated: Secondary | ICD-10-CM | POA: Diagnosis not present

## 2014-09-03 DIAGNOSIS — N529 Male erectile dysfunction, unspecified: Secondary | ICD-10-CM | POA: Diagnosis present

## 2014-09-03 DIAGNOSIS — F17210 Nicotine dependence, cigarettes, uncomplicated: Secondary | ICD-10-CM | POA: Diagnosis present

## 2014-09-03 DIAGNOSIS — Z79899 Other long term (current) drug therapy: Secondary | ICD-10-CM | POA: Diagnosis not present

## 2014-09-03 DIAGNOSIS — C775 Secondary and unspecified malignant neoplasm of intrapelvic lymph nodes: Secondary | ICD-10-CM | POA: Diagnosis not present

## 2014-09-03 HISTORY — PX: LYMPHADENECTOMY: SHX5960

## 2014-09-03 HISTORY — PX: ROBOT ASSISTED LAPAROSCOPIC RADICAL PROSTATECTOMY: SHX5141

## 2014-09-03 LAB — TYPE AND SCREEN
ABO/RH(D): O POS
Antibody Screen: NEGATIVE

## 2014-09-03 LAB — HEMOGLOBIN AND HEMATOCRIT, BLOOD
HCT: 42.2 % (ref 39.0–52.0)
HEMOGLOBIN: 13.5 g/dL (ref 13.0–17.0)

## 2014-09-03 SURGERY — ROBOTIC ASSISTED LAPAROSCOPIC RADICAL PROSTATECTOMY LEVEL 2
Anesthesia: General

## 2014-09-03 MED ORDER — MORPHINE SULFATE (PF) 2 MG/ML IV SOLN
2.0000 mg | INTRAVENOUS | Status: DC | PRN
  Administered 2014-09-03 – 2014-09-04 (×3): 2 mg via INTRAVENOUS
  Filled 2014-09-03 (×4): qty 1

## 2014-09-03 MED ORDER — CEFAZOLIN SODIUM-DEXTROSE 2-3 GM-% IV SOLR
INTRAVENOUS | Status: AC
  Filled 2014-09-03: qty 50

## 2014-09-03 MED ORDER — SODIUM CHLORIDE 0.9 % IR SOLN
Status: DC | PRN
  Administered 2014-09-03: 1000 mL

## 2014-09-03 MED ORDER — GLYCOPYRROLATE 0.2 MG/ML IJ SOLN
INTRAMUSCULAR | Status: DC | PRN
  Administered 2014-09-03: 0.4 mg via INTRAVENOUS

## 2014-09-03 MED ORDER — SCOPOLAMINE 1 MG/3DAYS TD PT72: MEDICATED_PATCH | TRANSDERMAL | Status: DC | PRN

## 2014-09-03 MED ORDER — MIDAZOLAM HCL 2 MG/2ML IJ SOLN
INTRAMUSCULAR | Status: AC
  Filled 2014-09-03: qty 4

## 2014-09-03 MED ORDER — CEFAZOLIN SODIUM 1-5 GM-% IV SOLN
1.0000 g | Freq: Three times a day (TID) | INTRAVENOUS | Status: AC
  Administered 2014-09-03 (×2): 1 g via INTRAVENOUS
  Filled 2014-09-03 (×3): qty 50

## 2014-09-03 MED ORDER — SODIUM CHLORIDE 0.9 % IV BOLUS (SEPSIS)
1000.0000 mL | Freq: Once | INTRAVENOUS | Status: AC
  Administered 2014-09-03: 1000 mL via INTRAVENOUS

## 2014-09-03 MED ORDER — HYDROMORPHONE HCL 1 MG/ML IJ SOLN
0.2500 mg | INTRAMUSCULAR | Status: DC | PRN
  Administered 2014-09-03 (×2): 0.5 mg via INTRAVENOUS

## 2014-09-03 MED ORDER — BUPIVACAINE-EPINEPHRINE (PF) 0.25% -1:200000 IJ SOLN
INTRAMUSCULAR | Status: AC
  Filled 2014-09-03: qty 30

## 2014-09-03 MED ORDER — LACTATED RINGERS IV SOLN
INTRAVENOUS | Status: DC | PRN
  Administered 2014-09-03 (×3): via INTRAVENOUS

## 2014-09-03 MED ORDER — MONTELUKAST SODIUM 10 MG PO TABS
10.0000 mg | ORAL_TABLET | Freq: Every day | ORAL | Status: DC
  Filled 2014-09-03: qty 1

## 2014-09-03 MED ORDER — DEXAMETHASONE SODIUM PHOSPHATE 10 MG/ML IJ SOLN
INTRAMUSCULAR | Status: AC
  Filled 2014-09-03: qty 1

## 2014-09-03 MED ORDER — MIDAZOLAM HCL 5 MG/5ML IJ SOLN
INTRAMUSCULAR | Status: DC | PRN
  Administered 2014-09-03: 2 mg via INTRAVENOUS

## 2014-09-03 MED ORDER — PROPOFOL 10 MG/ML IV BOLUS
INTRAVENOUS | Status: DC | PRN
  Administered 2014-09-03: 200 mg via INTRAVENOUS

## 2014-09-03 MED ORDER — CETYLPYRIDINIUM CHLORIDE 0.05 % MT LIQD
7.0000 mL | Freq: Two times a day (BID) | OROMUCOSAL | Status: DC
  Administered 2014-09-03 – 2014-09-04 (×2): 7 mL via OROMUCOSAL

## 2014-09-03 MED ORDER — EPHEDRINE SULFATE 50 MG/ML IJ SOLN
INTRAMUSCULAR | Status: DC | PRN
  Administered 2014-09-03 (×4): 10 mg via INTRAVENOUS

## 2014-09-03 MED ORDER — INFLUENZA VAC SPLIT QUAD 0.5 ML IM SUSY
0.5000 mL | PREFILLED_SYRINGE | INTRAMUSCULAR | Status: DC
  Filled 2014-09-03 (×2): qty 0.5

## 2014-09-03 MED ORDER — DOCUSATE SODIUM 100 MG PO CAPS
100.0000 mg | ORAL_CAPSULE | Freq: Two times a day (BID) | ORAL | Status: DC
  Administered 2014-09-03 – 2014-09-04 (×2): 100 mg via ORAL
  Filled 2014-09-03 (×2): qty 1

## 2014-09-03 MED ORDER — DEXAMETHASONE SODIUM PHOSPHATE 4 MG/ML IJ SOLN
INTRAMUSCULAR | Status: DC | PRN
  Administered 2014-09-03: 10 mg via INTRAVENOUS

## 2014-09-03 MED ORDER — HEPARIN SODIUM (PORCINE) 1000 UNIT/ML IJ SOLN
INTRAMUSCULAR | Status: AC
  Filled 2014-09-03: qty 1

## 2014-09-03 MED ORDER — LACTATED RINGERS IV SOLN
INTRAVENOUS | Status: DC | PRN
  Administered 2014-09-03: 10:00:00

## 2014-09-03 MED ORDER — SULFAMETHOXAZOLE-TRIMETHOPRIM 800-160 MG PO TABS: 1.0000 | ORAL_TABLET | Freq: Two times a day (BID) | ORAL | Status: DC

## 2014-09-03 MED ORDER — LIDOCAINE HCL (CARDIAC) 20 MG/ML IV SOLN
INTRAVENOUS | Status: AC
  Filled 2014-09-03: qty 5

## 2014-09-03 MED ORDER — HYDROMORPHONE HCL 2 MG/ML IJ SOLN
INTRAMUSCULAR | Status: AC
Start: 2014-09-03 — End: 2014-09-03
  Filled 2014-09-03: qty 1

## 2014-09-03 MED ORDER — STERILE WATER FOR IRRIGATION IR SOLN
Status: DC | PRN
  Administered 2014-09-03: 1000 mL

## 2014-09-03 MED ORDER — KCL IN DEXTROSE-NACL 20-5-0.45 MEQ/L-%-% IV SOLN
INTRAVENOUS | Status: AC
  Filled 2014-09-03: qty 1000

## 2014-09-03 MED ORDER — GLYCOPYRROLATE 0.2 MG/ML IJ SOLN
INTRAMUSCULAR | Status: AC
  Filled 2014-09-03: qty 3

## 2014-09-03 MED ORDER — DIPHENHYDRAMINE HCL 50 MG/ML IJ SOLN
INTRAMUSCULAR | Status: AC
  Filled 2014-09-03: qty 1

## 2014-09-03 MED ORDER — BUPIVACAINE-EPINEPHRINE 0.25% -1:200000 IJ SOLN
INTRAMUSCULAR | Status: DC | PRN
  Administered 2014-09-03: 30 mL

## 2014-09-03 MED ORDER — KCL IN DEXTROSE-NACL 20-5-0.45 MEQ/L-%-% IV SOLN
INTRAVENOUS | Status: DC
  Administered 2014-09-03 – 2014-09-04 (×2): via INTRAVENOUS
  Filled 2014-09-03 (×4): qty 1000

## 2014-09-03 MED ORDER — METOCLOPRAMIDE HCL 5 MG/ML IJ SOLN
INTRAMUSCULAR | Status: DC | PRN
  Administered 2014-09-03: 10 mg via INTRAVENOUS

## 2014-09-03 MED ORDER — DIPHENHYDRAMINE HCL 12.5 MG/5ML PO ELIX: 12.5000 mg | ORAL_SOLUTION | Freq: Four times a day (QID) | ORAL | Status: DC | PRN

## 2014-09-03 MED ORDER — HYDROCODONE-ACETAMINOPHEN 5-325 MG PO TABS: 1.0000 | ORAL_TABLET | Freq: Four times a day (QID) | ORAL | Status: DC | PRN

## 2014-09-03 MED ORDER — HYDROMORPHONE HCL 1 MG/ML IJ SOLN
INTRAMUSCULAR | Status: DC | PRN
  Administered 2014-09-03 (×2): 1 mg via INTRAVENOUS

## 2014-09-03 MED ORDER — NEOSTIGMINE METHYLSULFATE 10 MG/10ML IV SOLN
INTRAVENOUS | Status: DC | PRN
  Administered 2014-09-03: 5 mg via INTRAVENOUS

## 2014-09-03 MED ORDER — ROCURONIUM BROMIDE 100 MG/10ML IV SOLN
INTRAVENOUS | Status: AC
  Filled 2014-09-03: qty 1

## 2014-09-03 MED ORDER — ACETAMINOPHEN 325 MG PO TABS: 650.0000 mg | ORAL_TABLET | ORAL | Status: DC | PRN

## 2014-09-03 MED ORDER — PROMETHAZINE HCL 25 MG/ML IJ SOLN
6.2500 mg | INTRAMUSCULAR | Status: DC | PRN
Start: 2014-09-03 — End: 2014-09-03

## 2014-09-03 MED ORDER — METOCLOPRAMIDE HCL 5 MG/ML IJ SOLN
INTRAMUSCULAR | Status: AC
  Filled 2014-09-03: qty 2

## 2014-09-03 MED ORDER — HYDROMORPHONE HCL 1 MG/ML IJ SOLN
INTRAMUSCULAR | Status: AC
  Filled 2014-09-03: qty 1

## 2014-09-03 MED ORDER — LIDOCAINE HCL (CARDIAC) 20 MG/ML IV SOLN
INTRAVENOUS | Status: DC | PRN
  Administered 2014-09-03: 50 mg via INTRAVENOUS

## 2014-09-03 MED ORDER — DIPHENHYDRAMINE HCL 50 MG/ML IJ SOLN
INTRAMUSCULAR | Status: DC | PRN
  Administered 2014-09-03: 12.5 mg via INTRAVENOUS

## 2014-09-03 MED ORDER — ROCURONIUM BROMIDE 100 MG/10ML IV SOLN
INTRAVENOUS | Status: DC | PRN
  Administered 2014-09-03: 50 mg via INTRAVENOUS
  Administered 2014-09-03: 5 mg via INTRAVENOUS
  Administered 2014-09-03: 10 mg via INTRAVENOUS

## 2014-09-03 MED ORDER — FENTANYL CITRATE (PF) 100 MCG/2ML IJ SOLN
INTRAMUSCULAR | Status: DC | PRN
  Administered 2014-09-03: 50 ug via INTRAVENOUS
  Administered 2014-09-03 (×2): 25 ug via INTRAVENOUS

## 2014-09-03 MED ORDER — ONDANSETRON HCL 4 MG/2ML IJ SOLN
INTRAMUSCULAR | Status: AC
Start: 2014-09-03 — End: 2014-09-03
  Filled 2014-09-03: qty 2

## 2014-09-03 MED ORDER — CEFAZOLIN SODIUM-DEXTROSE 2-3 GM-% IV SOLR
2.0000 g | INTRAVENOUS | Status: AC
  Administered 2014-09-03: 2 g via INTRAVENOUS

## 2014-09-03 MED ORDER — PROPOFOL 10 MG/ML IV BOLUS
INTRAVENOUS | Status: AC
  Filled 2014-09-03: qty 20

## 2014-09-03 MED ORDER — NEOSTIGMINE METHYLSULFATE 10 MG/10ML IV SOLN
INTRAVENOUS | Status: AC
Start: 2014-09-03 — End: 2014-09-03
  Filled 2014-09-03: qty 1

## 2014-09-03 MED ORDER — DIPHENHYDRAMINE HCL 50 MG/ML IJ SOLN: 12.5000 mg | Freq: Four times a day (QID) | INTRAMUSCULAR | Status: DC | PRN

## 2014-09-03 MED ORDER — FLUOXETINE HCL 10 MG PO CAPS
10.0000 mg | ORAL_CAPSULE | Freq: Every day | ORAL | Status: DC
  Administered 2014-09-04: 10 mg via ORAL
  Filled 2014-09-03: qty 1

## 2014-09-03 MED ORDER — FENTANYL CITRATE (PF) 250 MCG/5ML IJ SOLN
INTRAMUSCULAR | Status: AC
  Filled 2014-09-03: qty 25

## 2014-09-03 MED ORDER — KETOROLAC TROMETHAMINE 15 MG/ML IJ SOLN
15.0000 mg | Freq: Four times a day (QID) | INTRAMUSCULAR | Status: DC
  Administered 2014-09-03 – 2014-09-04 (×4): 15 mg via INTRAVENOUS
  Filled 2014-09-03 (×6): qty 1

## 2014-09-03 MED ORDER — SCOPOLAMINE 1 MG/3DAYS TD PT72
MEDICATED_PATCH | TRANSDERMAL | Status: AC
  Administered 2014-09-03: 1 via TRANSDERMAL
  Filled 2014-09-03: qty 1

## 2014-09-03 SURGICAL SUPPLY — 53 items
CABLE HIGH FREQUENCY MONO STRZ (ELECTRODE) ×4 IMPLANT
CATH FOLEY 2WAY SLVR 18FR 30CC (CATHETERS) ×4 IMPLANT
CATH ROBINSON RED A/P 16FR (CATHETERS) ×4 IMPLANT
CATH ROBINSON RED A/P 8FR (CATHETERS) ×4 IMPLANT
CATH TIEMANN FOLEY 18FR 5CC (CATHETERS) ×4 IMPLANT
CHLORAPREP W/TINT 26ML (MISCELLANEOUS) ×4 IMPLANT
CLIP LIGATING HEM O LOK PURPLE (MISCELLANEOUS) ×12 IMPLANT
CLOTH BEACON ORANGE TIMEOUT ST (SAFETY) ×4 IMPLANT
COVER SURGICAL LIGHT HANDLE (MISCELLANEOUS) ×8 IMPLANT
COVER TIP SHEARS 8 DVNC (MISCELLANEOUS) ×2 IMPLANT
COVER TIP SHEARS 8MM DA VINCI (MISCELLANEOUS) ×2
CUTTER ECHEON FLEX ENDO 45 340 (ENDOMECHANICALS) ×4 IMPLANT
DECANTER SPIKE VIAL GLASS SM (MISCELLANEOUS) ×4 IMPLANT
DRAPE SURG IRRIG POUCH 19X23 (DRAPES) ×4 IMPLANT
DRSG TEGADERM 4X4.75 (GAUZE/BANDAGES/DRESSINGS) ×4 IMPLANT
DRSG TEGADERM 6X8 (GAUZE/BANDAGES/DRESSINGS) IMPLANT
ELECT REM PT RETURN 9FT ADLT (ELECTROSURGICAL) ×4
ELECTRODE REM PT RTRN 9FT ADLT (ELECTROSURGICAL) ×2 IMPLANT
GAUZE SPONGE 2X2 8PLY STRL LF (GAUZE/BANDAGES/DRESSINGS) ×2 IMPLANT
GLOVE BIO SURGEON STRL SZ 6.5 (GLOVE) ×3 IMPLANT
GLOVE BIO SURGEONS STRL SZ 6.5 (GLOVE) ×1
GLOVE BIOGEL M STRL SZ7.5 (GLOVE) ×8 IMPLANT
GOWN STRL REUS W/TWL LRG LVL3 (GOWN DISPOSABLE) ×12 IMPLANT
HOLDER FOLEY CATH W/STRAP (MISCELLANEOUS) ×4 IMPLANT
IV LACTATED RINGERS 1000ML (IV SOLUTION) ×4 IMPLANT
KIT ACCESSORY DA VINCI DISP (KITS) ×2
KIT ACCESSORY DVNC DISP (KITS) ×2 IMPLANT
LIQUID BAND (GAUZE/BANDAGES/DRESSINGS) ×4 IMPLANT
MANIFOLD NEPTUNE II (INSTRUMENTS) ×4 IMPLANT
NDL SAFETY ECLIPSE 18X1.5 (NEEDLE) ×2 IMPLANT
NEEDLE HYPO 18GX1.5 SHARP (NEEDLE) ×2
PACK ROBOT UROLOGY CUSTOM (CUSTOM PROCEDURE TRAY) ×4 IMPLANT
PEN SKIN MARKING BROAD (MISCELLANEOUS) ×4 IMPLANT
RELOAD GREEN ECHELON 45 (STAPLE) ×4 IMPLANT
SET TUBE IRRIG SUCTION NO TIP (IRRIGATION / IRRIGATOR) ×4 IMPLANT
SHEET LAVH (DRAPES) ×4 IMPLANT
SOLUTION ELECTROLUBE (MISCELLANEOUS) ×4 IMPLANT
SPONGE GAUZE 2X2 STER 10/PKG (GAUZE/BANDAGES/DRESSINGS) ×2
SUT ETHILON 3 0 PS 1 (SUTURE) ×4 IMPLANT
SUT MNCRL 3 0 RB1 (SUTURE) ×2 IMPLANT
SUT MNCRL 3 0 VIOLET RB1 (SUTURE) ×6 IMPLANT
SUT MNCRL AB 4-0 PS2 18 (SUTURE) ×8 IMPLANT
SUT MONOCRYL 3 0 RB1 (SUTURE) ×8
SUT VIC AB 0 CT1 27 (SUTURE) ×2
SUT VIC AB 0 CT1 27XBRD ANTBC (SUTURE) ×2 IMPLANT
SUT VIC AB 0 UR5 27 (SUTURE) ×4 IMPLANT
SUT VIC AB 2-0 SH 27 (SUTURE) ×4
SUT VIC AB 2-0 SH 27X BRD (SUTURE) ×4 IMPLANT
SUT VICRYL 0 UR6 27IN ABS (SUTURE) ×8 IMPLANT
SYR 27GX1/2 1ML LL SAFETY (SYRINGE) ×4 IMPLANT
TOWEL OR 17X26 10 PK STRL BLUE (TOWEL DISPOSABLE) ×4 IMPLANT
TOWEL OR NON WOVEN STRL DISP B (DISPOSABLE) ×4 IMPLANT
WATER STERILE IRR 1500ML POUR (IV SOLUTION) ×4 IMPLANT

## 2014-09-03 NOTE — Op Note (Signed)
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 Robotic assisted laparoscopic radical prostatectomy (left nerve sparing) 2. Bilateral robotic assisted laparoscopic pelvic lymphadenectomy  Surgeon: [name]. M.D.  Assistant(s):  Debbrah Alar, PA-C  Anesthesia: General  Complications: None  EBL: 100 mL  IVF:  1500 mL crystalloid  Specimens: 1. Prostate and seminal vesicles 2. Right pelvic lymph nodes 3. Left pelvic lymph nodes  Disposition of specimens: Pathology  Drains: 1. 20 Fr coude catheter 2. # 19 Blake pelvic drain  Indication: Matthew Adkins is a 64 y.o. patient with clinically localized prostate cancer.  After a thorough review of the management options for treatment of prostate cancer, he elected to proceed with surgical therapy and the above procedure(s).  We have discussed the potential benefits and risks of the procedure, side effects of the proposed treatment, the likelihood of the patient achieving the goals of the procedure, and any potential problems that might occur during the procedure or recuperation. Informed consent has been obtained.  Description of procedure:  The patient was taken to the operating room and a general anesthetic was administered. He was given preoperative antibiotics, placed in the dorsal lithotomy position, and prepped and draped in the usual sterile fashion. Next a preoperative timeout was performed. A urethral catheter was placed into the bladder and a site was selected near the umbilicus for placement of the camera port. This was placed using a standard open Hassan technique which allowed entry into the peritoneal cavity under direct vision and without difficulty. A 12 mm port was placed and a pneumoperitoneum established. The camera was then used to  inspect the abdomen and there was no evidence of any intra-abdominal injuries or other abnormalities. The remaining abdominal ports were then placed. 8 mm robotic ports were placed in the right lower quadrant, left lower quadrant, and far left lateral abdominal wall. A 5 mm port was placed in the right upper quadrant and a 12 mm port was placed in the right lateral abdominal wall for laparoscopic assistance. All ports were placed under direct vision without difficulty. The surgical cart was then docked.   Utilizing the cautery scissors, the bladder was reflected posteriorly allowing entry into the space of Retzius and identification of the endopelvic fascia and prostate. The periprostatic fat was then removed from the prostate allowing full exposure of the endopelvic fascia. The endopelvic fascia was then incised from the apex back to the base of the prostate bilaterally and the underlying levator muscle fibers were swept laterally off the prostate thereby isolating the dorsal venous complex. The dorsal vein was then stapled and divided with a 45 mm Flex Echelon stapler. Attention then turned to the bladder neck which was divided anteriorly thereby allowing entry into the bladder and exposure of the urethral catheter. The catheter balloon was deflated and the catheter was brought into the operative field and used to retract the prostate anteriorly. The posterior bladder neck was then examined and was divided allowing further dissection between the bladder and prostate posteriorly until the vasa deferentia and seminal vessels were identified. The vasa deferentia were isolated, divided, and lifted anteriorly. The seminal vesicles were dissected down to their tips with care to control the seminal vascular arterial blood supply. These structures were then lifted anteriorly and the space between Denonvillier's fascia and the anterior rectum was developed with a combination of  sharp and blunt dissection. This isolated  the vascular pedicles of the prostate.  The lateral prostatic fascia on the left side of the prostate was then sharply incised allowing release of the neurovascular bundle. The vascular pedicle of the prostate on the left side was then ligated with Weck clips between the prostate and neurovascular bundle and divided with sharp cold scissor dissection resulting in neurovascular bundle preservation. On the right side, a wide non nerve sparing dissection was performed with Weck clips used to ligate the vascular pedicle of the prostate. The neurovascular bundle on the left side was then separated off the apex of the prostate and urethra.   The urethra was then sharply transected allowing the prostate specimen to be disarticulated. The pelvis was copiously irrigated and hemostasis was ensured. There was no evidence for rectal injury.  Attention then turned to the right pelvic sidewall. The fibrofatty tissue between the external iliac vein, confluence of the iliac vessels, hypogastric artery, and Cooper's ligament was dissected free from the pelvic sidewall with care to preserve the obturator nerve. Weck clips were used for lymphostasis and hemostasis. An identical procedure was performed on the contralateral side and the lymphatic packets were removed for permanent pathologic analysis.  Attention then turned to the urethral anastomosis. A 2-0 Vicryl slip knot was placed between Denonvillier's fascia, the posterior bladder neck, and the posterior urethra to reapproximate these structures. A double-armed 3-0 Monocryl suture was then used to perform a 360 running tension-free anastomosis between the bladder neck and urethra. A new urethral catheter was then placed into the bladder and irrigated. There were no blood clots within the bladder and the anastomosis appeared to be watertight. A #19 Blake drain was then brought through the left lateral 8 mm port site and positioned appropriately within the pelvis. It was  secured to the skin with a nylon suture. The surgical cart was then undocked. The right lateral 12 mm port site was closed at the fascial level with a 0 Vicryl suture placed laparoscopically. All remaining ports were then removed under direct vision. The prostate specimen was removed intact within the Endopouch retrieval bag via the periumbilical camera port site. This fascial opening was closed with two running 0 Vicryl sutures. 0.25% Marcaine was then injected into all port sites and all incisions were reapproximated at the skin level with 4-0 Monocryl subcuticular sutures and Dermabond. The patient appeared to tolerate the procedure well and without complications. The patient was able to be extubated and transferred to the recovery unit in satisfactory condition.   [name] MD

## 2014-09-03 NOTE — Progress Notes (Signed)
Patient ID: Matthew Adkins, male   DOB: 06-Jul-1950, 64 y.o.   MRN: [id number]  Post-op note  Subjective: The patient is doing well.  No complaints.  Objective: Vital signs in last 24 hours: Temp:  [97.7 F (36.5 C)-97.9 F (36.6 C)] 97.7 F (36.5 C) (08/29 1201) Pulse Rate:  [61-100] 67 (08/29 1145) Resp:  [11-16] 11 (08/29 1145) BP: (129-143)/(64-80) 143/80 mmHg (08/29 1201) SpO2:  [98 %-100 %] 100 % (08/29 1145) Weight:  [87.714 kg (193 lb 6 oz)] 87.714 kg (193 lb 6 oz) (08/29 0550)  Intake/Output from previous day:   Intake/Output this shift: Total I/O In: 3050 [I.V.:2000; IV Piggyback:1050] Out: 270 [Urine:100; Drains:70; Blood:100]  Physical Exam:  General: Alert and oriented. Abdomen: Soft, Nondistended. Incisions: Clean and dry. GU: Urine clear  Lab Results:  Recent Labs  09/03/14 1113  HGB 13.5  HCT 42.2    Assessment/Plan: POD#0   1) Continue to monitor, ambulate, IS   [name]. MD   LOS: 0 days   [name],LES 09/03/2014, 3:43 PM

## 2014-09-03 NOTE — Anesthesia Postprocedure Evaluation (Signed)
Anesthesia Post Note  Patient: Matthew Adkins  Procedure(s) Performed: Procedure(s) (LRB): ROBOTIC ASSISTED LAPAROSCOPIC RADICAL PROSTATECTOMY LEVEL 2 (N/A) PELVIC LYMPHADENECTOMY (Bilateral)  Anesthesia type: general  Patient location: PACU  Post pain: Pain level controlled  Post assessment: Patient's Cardiovascular Status Stable  Last Vitals:  Filed Vitals:   09/03/14 1115  BP: 131/72  Pulse: 82  Temp:   Resp: 13    Post vital signs: Reviewed and stable  Level of consciousness: sedated  Complications: No apparent anesthesia complications

## 2014-09-03 NOTE — Progress Notes (Signed)
Utilization review completed.  

## 2014-09-03 NOTE — Discharge Instructions (Signed)

## 2014-09-03 NOTE — Anesthesia Procedure Notes (Signed)
Procedure Name: Intubation Date/Time: 09/03/2014 7:30 AM Performed by: Deliah Boston Pre-anesthesia Checklist: Patient identified, Emergency Drugs available, Suction available and Patient being monitored Patient Re-evaluated:Patient Re-evaluated prior to inductionOxygen Delivery Method: Circle System Utilized Preoxygenation: Pre-oxygenation with 100% oxygen Intubation Type: IV induction Ventilation: Mask ventilation without difficulty Laryngoscope Size: Mac and 4 Grade View: Grade II Tube type: Oral Tube size: 8.0 mm Number of attempts: 1 Airway Equipment and Method: Oral airway Placement Confirmation: ETT inserted through vocal cords under direct vision,  positive ETCO2 and breath sounds checked- equal and bilateral Secured at: 22 cm Tube secured with: Tape Dental Injury: Teeth and Oropharynx as per pre-operative assessment

## 2014-09-03 NOTE — Transfer of Care (Signed)
Immediate Anesthesia Transfer of Care Note  Patient: Matthew Adkins  Procedure(s) Performed: Procedure(s): ROBOTIC ASSISTED LAPAROSCOPIC RADICAL PROSTATECTOMY LEVEL 2 (N/A) PELVIC LYMPHADENECTOMY (Bilateral)  Patient Location: PACU  Anesthesia Type:General  Level of Consciousness: awake and patient cooperative  Airway & Oxygen Therapy: Patient Spontanous Breathing and Patient connected to face mask oxygen  Post-op Assessment: Report given to RN and Post -op Vital signs reviewed and stable  Post vital signs: Reviewed and stable  Last Vitals:  Filed Vitals:   09/03/14 0550  BP: 143/78  Pulse: 61  Temp: 36.6 C  Resp: 16    Complications: No apparent anesthesia complications

## 2014-09-04 ENCOUNTER — Encounter (HOSPITAL_COMMUNITY): Payer: Self-pay | Admitting: Urology

## 2014-09-04 ENCOUNTER — Encounter: Payer: Self-pay | Admitting: Medical Oncology

## 2014-09-04 LAB — HEMOGLOBIN AND HEMATOCRIT, BLOOD
HEMATOCRIT: 39.1 % (ref 39.0–52.0)
HEMOGLOBIN: 12.3 g/dL — AB (ref 13.0–17.0)

## 2014-09-04 MED ORDER — HYDROCODONE-ACETAMINOPHEN 5-325 MG PO TABS
1.0000 | ORAL_TABLET | Freq: Four times a day (QID) | ORAL | Status: DC | PRN
  Administered 2014-09-04: 2 via ORAL
  Filled 2014-09-04: qty 2

## 2014-09-04 MED ORDER — BISACODYL 10 MG RE SUPP
10.0000 mg | Freq: Once | RECTAL | Status: AC
  Administered 2014-09-04: 10 mg via RECTAL
  Filled 2014-09-04: qty 1

## 2014-09-04 NOTE — Progress Notes (Signed)
Oncology Nurse Navigator Documentation  Oncology Nurse Navigator Flowsheets 07/31/2014 08/09/2014 09/04/2014  Referral date to RadOnc/MedOnc - - -  Navigator Encounter Type Clinic/MDC Telephone -  Patient Visit Type - - Surgery- Mr. Gomes states that he is doing well post prostatectomy 8/29. He had a good night and he is planing to go home today. We discussed the importance of continue to ambulate and to do his deep breathing exercises. I asked him to call me with any questions or concerns. He voiced understanding of the above.  Treatment Phase - - -  Barriers/Navigation Needs - No barriers at this time No barriers at this time  Interventions - Coordination of Care -  Support Groups/Services - Friends and Family Friends and Family  Time Spent with Patient [phone]

## 2014-09-04 NOTE — Discharge Summary (Signed)
  Date of admission: 09/03/2014  Date of discharge: 09/04/2014  Admission diagnosis: Prostate Cancer  Discharge diagnosis: Prostate Cancer  History and Physical: For full details, please see admission history and physical. Briefly, Matthew Adkins is a 64 y.o. gentleman with localized prostate cancer.  After discussing management/treatment options, he elected to proceed with surgical treatment.  Hospital Course: ANIL HAVARD was taken to the operating room on 09/03/2014 and underwent a robotic assisted laparoscopic radical prostatectomy. He tolerated this procedure well and without complications. Postoperatively, he was able to be transferred to a regular hospital room following recovery from anesthesia.  He was able to begin ambulating the night of surgery. He remained hemodynamically stable overnight.  He had excellent urine output with appropriately minimal output from his pelvic drain and his pelvic drain was removed on POD #1.  He was transitioned to oral pain medication, tolerated a clear liquid diet, and had met all discharge criteria and was able to be discharged home later on POD#1.  Laboratory values:  Recent Labs  09/03/14 1113 09/04/14 0437  HGB 13.5 12.3*  HCT 42.2 39.1    Disposition: Home  Discharge instruction: He was instructed to be ambulatory but to refrain from heavy lifting, strenuous activity, or driving. He was instructed on urethral catheter care.  Discharge medications:     Medication List    STOP taking these medications        aspirin EC 81 MG tablet     CENTRUM PO     FLOMAX 0.4 MG Caps capsule  Generic drug:  tamsulosin      TAKE these medications        EPINEPHrine 0.3 mg/0.3 mL Devi  Commonly known as:  EPIPEN  USE AS DIRECTED IF FEELING ILL AFTER BEE STINGS.     FLUoxetine 10 MG capsule  Commonly known as:  PROZAC  TAKE 1 CAPSULE BY MOUTH DAILY.     HYDROcodone-acetaminophen 5-325 MG per tablet  Commonly known as:  NORCO  Take 1-2  tablets by mouth every 6 (six) hours as needed.     montelukast 10 MG tablet  Commonly known as:  SINGULAIR  Take 1 tablet (10 mg total) by mouth at bedtime.     sulfamethoxazole-trimethoprim 800-160 MG per tablet  Commonly known as:  BACTRIM DS,SEPTRA DS  Take 1 tablet by mouth 2 (two) times daily. Start the day prior to foley removal appointment        Followup: He will followup in 1 week for catheter removal and to discuss his surgical pathology results.

## 2014-09-04 NOTE — Progress Notes (Signed)
D/C'd w/ family via w/c voices no C/O

## 2014-09-04 NOTE — Progress Notes (Signed)
Patient ID: Matthew Adkins, male   DOB: [birth year], 64 y.o.   MRN: [id number]  1 Day Post-Op Subjective: The patient is doing well.  No nausea or vomiting. Pain is adequately controlled.  Objective: Vital signs in last 24 hours: Temp:  [97.6 F (36.4 C)-98.3 F (36.8 C)] 98.3 F (36.8 C) (08/30 0531) Pulse Rate:  [52-100] 52 (08/30 0531) Resp:  [11-18] 18 (08/30 0531) BP: (126-143)/(64-80) 128/66 mmHg (08/30 0531) SpO2:  [99 %-100 %] 99 % (08/30 0531)  Intake/Output from previous day: 08/29 0701 - 08/30 0700 In: 5722.5 [I.V.:4672.5; IV Piggyback:1050] Out: 2725 [Urine:2425; Drains:200; Blood:100] Intake/Output this shift:    Physical Exam:  General: Alert and oriented. CV: RRR Lungs: Clear bilaterally. GI: Soft, Nondistended. Incisions: Clean, dry, and intact Urine: Clear Extremities: Nontender, no erythema, no edema.  Lab Results:  Recent Labs  09/03/14 1113 09/04/14 0437  HGB 13.5 12.3*  HCT 42.2 39.1      Assessment/Plan: POD# 1 s/p robotic prostatectomy.  1) SL IVF 2) Ambulate, Incentive spirometry 3) Transition to oral pain medication 4) Dulcolax suppository 5) D/C pelvic drain 6) Plan for likely discharge later today   [name]. MD   LOS: 1 day   Sincere Berlanga,LES 09/04/2014, 7:32 AM

## 2014-09-04 NOTE — Care Management Note (Signed)
Case Management Note  Patient Details  Name: ULICE FOLLETT MRN: [id number] Date of Birth: [birth year]  Subjective/Objective:63 y/o m admitted w/Prostate Ca.                    Action/Plan:d/c plan home.No needs or orders.   Expected Discharge Date:                  Expected Discharge Plan:  Home/Self Care  In-House Referral:     Discharge planning Services  CM Consult  Post Acute Care Choice:    Choice offered to:     DME Arranged:    DME Agency:     HH Arranged:    [place] Agency:     Status of Service:  Completed, signed off  Medicare Important Message Given:    Date Medicare IM Given:    Medicare IM give by:    Date Additional Medicare IM Given:    Additional Medicare Important Message give by:     If discussed at [place] of Stay Meetings, dates discussed:    Additional Comments:  Dessa Phi, RN 09/04/2014, 11:52 AM

## 2014-09-19 ENCOUNTER — Ambulatory Visit (INDEPENDENT_AMBULATORY_CARE_PROVIDER_SITE_OTHER): Payer: Medicare Other | Admitting: Family Medicine

## 2014-09-19 ENCOUNTER — Encounter: Payer: Self-pay | Admitting: Family Medicine

## 2014-09-19 VITALS — BP 126/64 | HR 60 | Resp 18 | Ht 75.0 in | Wt 186.0 lb

## 2014-09-19 DIAGNOSIS — F329 Major depressive disorder, single episode, unspecified: Secondary | ICD-10-CM

## 2014-09-19 DIAGNOSIS — M179 Osteoarthritis of knee, unspecified: Secondary | ICD-10-CM

## 2014-09-19 DIAGNOSIS — M1712 Unilateral primary osteoarthritis, left knee: Secondary | ICD-10-CM

## 2014-09-19 DIAGNOSIS — Z23 Encounter for immunization: Secondary | ICD-10-CM

## 2014-09-19 DIAGNOSIS — M51369 Other intervertebral disc degeneration, lumbar region without mention of lumbar back pain or lower extremity pain: Secondary | ICD-10-CM

## 2014-09-19 DIAGNOSIS — F17210 Nicotine dependence, cigarettes, uncomplicated: Secondary | ICD-10-CM

## 2014-09-19 DIAGNOSIS — M5136 Other intervertebral disc degeneration, lumbar region: Secondary | ICD-10-CM | POA: Diagnosis not present

## 2014-09-19 DIAGNOSIS — C61 Malignant neoplasm of prostate: Secondary | ICD-10-CM

## 2014-09-19 DIAGNOSIS — Z720 Tobacco use: Secondary | ICD-10-CM

## 2014-09-19 DIAGNOSIS — F32A Depression, unspecified: Secondary | ICD-10-CM

## 2014-09-19 DIAGNOSIS — F17200 Nicotine dependence, unspecified, uncomplicated: Secondary | ICD-10-CM

## 2014-09-19 MED ORDER — METHYLPREDNISOLONE ACETATE 80 MG/ML IJ SUSP
80.0000 mg | Freq: Once | INTRAMUSCULAR | Status: AC
  Administered 2014-09-19: 80 mg via INTRAMUSCULAR

## 2014-09-19 MED ORDER — GABAPENTIN 100 MG PO CAPS: 100.0000 mg | ORAL_CAPSULE | Freq: Every day | ORAL | Status: DC

## 2014-09-19 MED ORDER — IBUPROFEN 600 MG PO TABS: 600.0000 mg | ORAL_TABLET | Freq: Three times a day (TID) | ORAL | Status: DC

## 2014-09-19 MED ORDER — PREDNISONE 5 MG PO TABS: 5.0000 mg | ORAL_TABLET | Freq: Two times a day (BID) | ORAL | Status: DC

## 2014-09-19 MED ORDER — KETOROLAC TROMETHAMINE 60 MG/2ML IM SOLN
60.0000 mg | Freq: Once | INTRAMUSCULAR | Status: AC
  Administered 2014-09-19: 60 mg via INTRAMUSCULAR

## 2014-09-19 NOTE — Patient Instructions (Addendum)
Annual wellness as before in Nov, call if you need me sooner  Flu vaccine today  Pain on left leg is from arthritis and bulging discs in low back, injections in office and 3 medications are prescribed,  Start nicoderm patch 21 mg for 2 weeks , then 14 mg for 2 weeks , then 7 mg for  2 weeks then STOP  DO NOT SMOKE when you use the patch

## 2014-09-19 NOTE — Assessment & Plan Note (Addendum)
Patient counseled for approximately 5 minutes regarding the health risks of ongoing nicotine use, specifically all types of cancer, heart disease, stroke and respiratory failure. The options available for help with cessation ,the behavioral changes to assist the process, and the option to either gradully reduce usage  Or abruptly stop.is also discussed. Ready to quit , plans to start the patch, current use is 15 per day

## 2014-09-19 NOTE — Progress Notes (Signed)
   Subjective:    Patient ID: Matthew Adkins, male    DOB: 1950/07/12, 64 y.o.   MRN: [id number]  HPI   Matthew Adkins     MRN: [id number]      DOB: [birth year]   HPI Matthew Adkins is here for follow up and re-evaluation of chronic medical conditions, medication management and review of any available recent lab and radiology data.  Preventive health is updated, specifically  Cancer screening and Immunization.   Recovering well from total prostatectomy on 09/03/2014, will see oncology team in near future The PT denies any adverse reactions to current medications since the last visit.  2 week h/o low back and left lower extremity pain, no trauma, has established arthrtis and disc disease  ROS Denies recent fever or chills. Denies sinus pressure, nasal congestion, ear pain or sore throat. Denies chest congestion, productive cough or wheezing. Denies chest pains, palpitations and leg swelling Denies abdominal pain, nausea, vomiting,diarrhea or constipation.   Denies dysuria, frequency, hesitancy or incontinence.  Denies headaches, seizures, numbness, or tingling. Denies uncontrolled  depression, anxiety or insomnia. Denies skin break down or rash.   PE  BP 126/64 mmHg  Pulse 60  Resp 18  Ht 6\' 3"  (1.905 m)  Wt 186 lb (84.369 kg)  BMI 23.25 kg/m2  SpO2 99%  Patient alert and oriented and in no cardiopulmonary distress.  HEENT: No facial asymmetry, EOMI,   oropharynx pink and moist.  Neck supple no JVD, no mass.  Chest: Clear to auscultation bilaterally.Decreased air entry throughout  CVS: S1, S2 no murmurs, no S3.Regular rate.  ABD: Soft non tender.   Ext: No edema  MS: Decreased  ROM lumbar  Spine, and left lower extremity, otherwise normal .  Skin: Intact, no ulcerations or rash noted.  Psych: Good eye contact, normal affect. Memory intact not anxious slightly  depressed appearing.  CNS: CN 2-12 intact, power,  normal throughout.no focal deficits  noted.   Assessment & Plan   NICOTINE ADDICTION Patient counseled for approximately 5 minutes regarding the health risks of ongoing nicotine use, specifically all types of cancer, heart disease, stroke and respiratory failure. The options available for help with cessation ,the behavioral changes to assist the process, and the option to either gradully reduce usage  Or abruptly stop.is also discussed. Ready to quit , plans to start the patch, current use is 15 per day   Degenerative disc disease, lumbar Presenting with acute left sciatica Uncontrolled.Toradol and depo medrol administered IM in the office , to be followed by a short course of oral prednisone and NSAIDS.   Need for prophylactic vaccination and inoculation against influenza After obtaining informed consent, the vaccine is  administered by LPN.   Prostate cancer Had radical prostatectomy on 09/03/2014, meets with treatment team i near future to discuss management plan. No discussion by me during visit as to path report seen i chart, he stated to me that the "cancer had not spread" Oncology will address with pt at his upcoming appt  Depression Controlled, no change in medication       Review of Systems     Objective:   Physical Exam        Assessment & Plan:

## 2014-09-23 NOTE — Assessment & Plan Note (Signed)
After obtaining informed consent, the vaccine is  administered by LPN.  

## 2014-09-23 NOTE — Assessment & Plan Note (Signed)
Had radical prostatectomy on 09/03/2014, meets with treatment team i near future to discuss management plan. No discussion by me during visit as to path report seen i chart, he stated to me that the "cancer had not spread" Oncology will address with pt at his upcoming appt

## 2014-09-23 NOTE — Assessment & Plan Note (Signed)
Controlled, no change in medication  

## 2014-09-23 NOTE — Assessment & Plan Note (Signed)
Presenting with acute left sciatica Uncontrolled.Toradol and depo medrol administered IM in the office , to be followed by a short course of oral prednisone and NSAIDS.

## 2014-09-24 ENCOUNTER — Telehealth: Payer: Self-pay | Admitting: Family Medicine

## 2014-09-24 MED ORDER — GABAPENTIN 300 MG PO CAPS: 300.0000 mg | ORAL_CAPSULE | Freq: Three times a day (TID) | ORAL | Status: DC

## 2014-09-24 NOTE — Telephone Encounter (Signed)
C/o uncontrolled pain "doesnt have any pain pills. Advise increase gabapentintin to three  (100 mg  Tabs at bedtime )m and ned dose sent in for 300 mg one at bedtime

## 2014-09-25 NOTE — Telephone Encounter (Signed)
Patient aware and medication sent to pharmacy  

## 2014-09-27 ENCOUNTER — Telehealth: Payer: Self-pay | Admitting: *Deleted

## 2014-09-27 MED ORDER — TRAMADOL HCL 50 MG PO TABS: 50.0000 mg | ORAL_TABLET | Freq: Three times a day (TID) | ORAL | Status: DC | PRN

## 2014-09-27 NOTE — Telephone Encounter (Signed)
pls send in/ call in  tramdol 50 mg one tablet every 8 hours as needed for pain # 90 refill x 1 and let him know

## 2014-09-27 NOTE — Telephone Encounter (Signed)
Medication called in to pharmacy.  Left message for patient to return call on both listed #s

## 2014-09-27 NOTE — Telephone Encounter (Signed)
Patient states that increase in Gabapentin is not helping with pain.

## 2014-09-27 NOTE — Addendum Note (Signed)
Addended by: Denman George B on: 09/27/2014 03:07 PM   Modules accepted: Orders

## 2014-09-27 NOTE — Telephone Encounter (Signed)
Matthew Adkins called stating he was told to call back if the medcation was not helping. Please advise

## 2014-10-01 DIAGNOSIS — M6281 Muscle weakness (generalized): Secondary | ICD-10-CM | POA: Diagnosis not present

## 2014-10-01 DIAGNOSIS — C61 Malignant neoplasm of prostate: Secondary | ICD-10-CM | POA: Diagnosis not present

## 2014-10-11 ENCOUNTER — Encounter: Payer: Self-pay | Admitting: Orthopedic Surgery

## 2014-10-11 ENCOUNTER — Ambulatory Visit (INDEPENDENT_AMBULATORY_CARE_PROVIDER_SITE_OTHER): Payer: Medicare Other | Admitting: Orthopedic Surgery

## 2014-10-11 VITALS — BP 143/78 | Ht 75.0 in | Wt 186.0 lb

## 2014-10-11 DIAGNOSIS — M170 Bilateral primary osteoarthritis of knee: Secondary | ICD-10-CM

## 2014-10-11 DIAGNOSIS — M4806 Spinal stenosis, lumbar region: Secondary | ICD-10-CM

## 2014-10-11 DIAGNOSIS — M48061 Spinal stenosis, lumbar region without neurogenic claudication: Secondary | ICD-10-CM

## 2014-10-11 NOTE — Progress Notes (Signed)
Patient ID: Matthew Adkins, male   DOB: [birth year], 64 y.o.   MRN: [id number]  Chief Complaint  Patient presents with  . Follow-up    Recheck for left leg pain.    HPI Matthew Adkins is a 64 y.o. male.  Who presents for follow-up visit status post recent prostate surgery. He says he's had some urinary related symptoms from that but is recovering nicely he is also noted some leg pain. His MRI back in 2014 showed L3-4, L4-5 disc disease with bulging disks and bilateral foraminal stenosis consistent with spinal stenosis  He also has bilateral knee pain and bilateral osteoarthritis of the knee  He came in for routine checkup today. Review of systems otherwise negative  Review of Systems Review of Systems  Past Medical History  Diagnosis Date  . Nicotine addiction   . Seasonal allergies   . Bladder cancer 2000  . Prostate cancer 07/06/2014  . Depression     Past Surgical History  Procedure Laterality Date  . Athroscopy of right knee  2002  . Foot surgery  2004  . Colonoscopy  MJ 2009 pmhX: POLYPS    POLYP REMOVED BUT NOT RETRIEVED  . Colonoscopy  LS 2007 ARS    ? POLYPS, no path available  . Cancerous polyps removed from bladder  2000  . Colonoscopy  09/26/2010    sessile poylp(2) pan-colnoic diverticulosis,mild/internal hemorrhoids  . Prostate biopsy  07/06/14  . Hernia repair  1980's    left inguinal hernia  . Robot assisted laparoscopic radical prostatectomy N/A 09/03/2014    Procedure: ROBOTIC ASSISTED LAPAROSCOPIC RADICAL PROSTATECTOMY LEVEL 2;  Surgeon: Raynelle Bring, MD;  Location: [facility];  Service: Urology;  Laterality: N/A;  . Lymphadenectomy Bilateral 09/03/2014    Procedure: PELVIC LYMPHADENECTOMY;  Surgeon: Raynelle Bring, MD;  Location: [facility];  Service: Urology;  Laterality: Bilateral;    Family History  Problem Relation Age of Onset  . Heart attack Father   . Diabetes Sister   . Hypertension Sister   . Aneurysm Sister     brain   . Arthritis      family  history   . Colon cancer Neg Hx   . Colon polyps Neg Hx     Social History Social History  Substance Use Topics  . Smoking status: Current Every Day Smoker -- 0.50 packs/day for 30 years    Types: Cigarettes  . Smokeless tobacco: Not on file  . Alcohol Use: Yes     Comment: pint/month    Allergies  Allergen Reactions  . Bee Venom   . Codeine Nausea And Vomiting    Current Outpatient Prescriptions  Medication Sig Dispense Refill  . EPINEPHrine (EPIPEN) 0.3 mg/0.3 mL DEVI USE AS DIRECTED IF FEELING ILL AFTER BEE STINGS. 1 Device 3  . FLUoxetine (PROZAC) 10 MG capsule TAKE 1 CAPSULE BY MOUTH DAILY. 30 capsule 3  . gabapentin (NEURONTIN) 300 MG capsule Take 1 capsule (300 mg total) by mouth 3 (three) times daily. 30 capsule 3  . HYDROcodone-acetaminophen (NORCO) 5-325 MG per tablet Take 1-2 tablets by mouth every 6 (six) hours as needed. 30 tablet 0  . ibuprofen (ADVIL,MOTRIN) 600 MG tablet Take 1 tablet (600 mg total) by mouth 3 (three) times daily. 21 tablet 0  . montelukast (SINGULAIR) 10 MG tablet Take 1 tablet (10 mg total) by mouth at bedtime. (Patient taking differently: Take 10 mg by mouth daily as needed (FOR ALLERGIES). ) 30 tablet 3  . predniSONE (DELTASONE) 5  MG tablet Take 1 tablet (5 mg total) by mouth 2 (two) times daily with a meal. 10 tablet 0  . traMADol (ULTRAM) 50 MG tablet Take 1 tablet (50 mg total) by mouth every 8 (eight) hours as needed. 90 tablet 1   No current facility-administered medications for this visit.       Physical Exam Physical Exam Blood pressure 143/78, height 6\' 3"  (1.905 m), weight 186 lb (84.369 kg). Appearance, there are no abnormalities in terms of appearance the patient was well-developed and well-nourished. The grooming and hygiene were normal.  Mental status orientation, there was normal alertness and orientation Mood pleasant Ambulatory status normal with no assistive devices  Examination of the lumbar spine reveals no  tenderness in the thoracic cervical or lumbar spine.  His right knee has a flexion contracture 10 the left knee has a flexion contracture 5 both knees flex 115 or more both knees are stable strength is normal in both legs hip range of motion is normal without pain skin is warm dry and intact without laceration ulceration or erythema he has 2+ reflexes at the knee on both legs and he has good distal pulse  Capillary refill normal     Data Reviewed MRI report from 2014 as described  Assessment  Stable bilateral knee osteoarthritis primary Stable spinal stenosis   Plan  Check up in 6 months and x-ray bilateral knees

## 2014-10-12 ENCOUNTER — Other Ambulatory Visit: Payer: Self-pay

## 2014-10-12 MED ORDER — METFORMIN HCL 500 MG PO TABS: 500.0000 mg | ORAL_TABLET | Freq: Two times a day (BID) | ORAL | Status: DC

## 2014-10-15 ENCOUNTER — Other Ambulatory Visit: Payer: Self-pay

## 2014-10-15 DIAGNOSIS — C61 Malignant neoplasm of prostate: Secondary | ICD-10-CM | POA: Diagnosis not present

## 2014-10-15 DIAGNOSIS — M6281 Muscle weakness (generalized): Secondary | ICD-10-CM | POA: Diagnosis not present

## 2014-10-15 MED ORDER — METFORMIN HCL 500 MG PO TABS: 500.0000 mg | ORAL_TABLET | Freq: Two times a day (BID) | ORAL | Status: DC

## 2014-10-22 ENCOUNTER — Other Ambulatory Visit: Payer: Self-pay | Admitting: Family Medicine

## 2014-10-25 DIAGNOSIS — C61 Malignant neoplasm of prostate: Secondary | ICD-10-CM | POA: Diagnosis not present

## 2014-11-01 DIAGNOSIS — N393 Stress incontinence (female) (male): Secondary | ICD-10-CM | POA: Diagnosis not present

## 2014-11-01 DIAGNOSIS — C61 Malignant neoplasm of prostate: Secondary | ICD-10-CM | POA: Diagnosis not present

## 2014-11-14 ENCOUNTER — Encounter: Payer: BC Managed Care – PPO | Admitting: Family Medicine

## 2014-11-14 ENCOUNTER — Encounter: Payer: Self-pay | Admitting: Family Medicine

## 2015-02-07 ENCOUNTER — Other Ambulatory Visit: Payer: Self-pay | Admitting: Family Medicine

## 2015-03-26 ENCOUNTER — Other Ambulatory Visit: Payer: Self-pay | Admitting: Acute Care

## 2015-03-26 DIAGNOSIS — F17210 Nicotine dependence, cigarettes, uncomplicated: Principal | ICD-10-CM

## 2015-04-11 ENCOUNTER — Encounter: Payer: Self-pay | Admitting: Orthopedic Surgery

## 2015-04-11 ENCOUNTER — Ambulatory Visit: Payer: Medicare Other

## 2015-04-11 ENCOUNTER — Encounter: Payer: Medicare Other | Admitting: Orthopedic Surgery

## 2015-04-24 DIAGNOSIS — N393 Stress incontinence (female) (male): Secondary | ICD-10-CM | POA: Diagnosis not present

## 2015-04-24 DIAGNOSIS — C61 Malignant neoplasm of prostate: Secondary | ICD-10-CM | POA: Diagnosis not present

## 2015-04-24 DIAGNOSIS — Z0000 Encounter for general adult medical examination without abnormal findings: Secondary | ICD-10-CM | POA: Diagnosis not present

## 2015-05-22 ENCOUNTER — Encounter: Payer: Self-pay | Admitting: Orthopedic Surgery

## 2015-05-22 ENCOUNTER — Ambulatory Visit (INDEPENDENT_AMBULATORY_CARE_PROVIDER_SITE_OTHER): Payer: Medicare Other | Admitting: Orthopedic Surgery

## 2015-05-22 ENCOUNTER — Ambulatory Visit (INDEPENDENT_AMBULATORY_CARE_PROVIDER_SITE_OTHER): Payer: Medicare Other

## 2015-05-22 VITALS — BP 122/69 | Ht 75.0 in | Wt 199.0 lb

## 2015-05-22 DIAGNOSIS — M75102 Unspecified rotator cuff tear or rupture of left shoulder, not specified as traumatic: Secondary | ICD-10-CM | POA: Diagnosis not present

## 2015-05-22 DIAGNOSIS — M170 Bilateral primary osteoarthritis of knee: Secondary | ICD-10-CM | POA: Diagnosis not present

## 2015-05-22 DIAGNOSIS — M48061 Spinal stenosis, lumbar region without neurogenic claudication: Secondary | ICD-10-CM

## 2015-05-22 DIAGNOSIS — M4806 Spinal stenosis, lumbar region: Secondary | ICD-10-CM

## 2015-05-22 DIAGNOSIS — M25512 Pain in left shoulder: Secondary | ICD-10-CM | POA: Diagnosis not present

## 2015-05-22 NOTE — Patient Instructions (Signed)

## 2015-05-22 NOTE — Progress Notes (Signed)
Chief Complaint  Patient presents with  . Follow-up    LEFT SHOULDER PAIN   Osteoarthritis left and right knee  Lower back pain  Patient follows up for evaluation of recent onset left shoulder pain though he has noted improvement taking omega plus supplement.  Lower back is stable intermittent pain without radiculopathy. Arthritis left and right knee stable at this time using braces with occasional pain related to damp weather.  Left shoulder started hurting a few weeks ago to call for an appointment. He took some omega-3 supplements and noticed improvement though he still has a slight catch in pain moderate in the left shoulder especially associated with reaching out to pick up even small objects  Review of systems there is been no fever associated with this and no numbness or tingling in the left arm. Does not notice any weakness in his left arm  Past Medical History  Diagnosis Date  . Nicotine addiction   . Seasonal allergies   . Bladder cancer ([place]) 2000  . Prostate cancer ([place]) 07/06/2014  . Depression      BP 122/69 mmHg  Ht 6\' 3"  (1.905 m)  Wt 199 lb (90.266 kg)  BMI 24.87 kg/m2 We find him today to be in good spirits normal mood and affect oriented to person place and time well-groomed normal appearance thin body habitus. Gait is remarkable for slight antalgic gait in both legs.  Left shoulder is tender over the posterior inferior acromion with painful range of motion and catching but full range of motion. The catching is especially when he is reaching out and he demonstrated that in the office. There is no instability. He has good strength in his rotator cuff supraspinatus external rotators. The skin is warm dry and intact without erythema. He has a good distal radial pulse. He has no supraclavicular palpable lymph nodes. There is normal sensation in the left arm.  The x-ray was taken he has mild proximal migration of the humerus break in Shenton's line medially with  sclerosis of the undersurface of the acromion and greater tuberosity  Impression chronic rotator cuff disease Bursitis and rotator cuff syndrome left shoulder  Recommend subacromial injection follow-up 6 months  Procedure note the subacromial injection shoulder left   Verbal consent was obtained to inject the  Left   Shoulder  Timeout was completed to confirm the injection site is a subacromial space of the  left  shoulder  Medication used Depo-Medrol 40 mg and lidocaine 1% 3 cc  Anesthesia was provided by ethyl chloride  The injection was performed in the left  posterior subacromial space. After pinning the skin with alcohol and anesthetized the skin with ethyl chloride the subacromial space was injected using a 20-gauge needle. There were no complications  Sterile dressing was applied.

## 2015-05-29 ENCOUNTER — Ambulatory Visit (INDEPENDENT_AMBULATORY_CARE_PROVIDER_SITE_OTHER): Payer: Medicare Other | Admitting: Family Medicine

## 2015-05-29 ENCOUNTER — Encounter: Payer: Self-pay | Admitting: Family Medicine

## 2015-05-29 VITALS — BP 122/78 | HR 57 | Resp 16 | Ht 75.0 in | Wt 201.0 lb

## 2015-05-29 DIAGNOSIS — R7309 Other abnormal glucose: Secondary | ICD-10-CM | POA: Diagnosis not present

## 2015-05-29 DIAGNOSIS — Z1159 Encounter for screening for other viral diseases: Secondary | ICD-10-CM

## 2015-05-29 DIAGNOSIS — R7303 Prediabetes: Secondary | ICD-10-CM | POA: Diagnosis not present

## 2015-05-29 DIAGNOSIS — C61 Malignant neoplasm of prostate: Secondary | ICD-10-CM

## 2015-05-29 DIAGNOSIS — E785 Hyperlipidemia, unspecified: Secondary | ICD-10-CM

## 2015-05-29 DIAGNOSIS — F32A Depression, unspecified: Secondary | ICD-10-CM

## 2015-05-29 DIAGNOSIS — F17200 Nicotine dependence, unspecified, uncomplicated: Secondary | ICD-10-CM

## 2015-05-29 DIAGNOSIS — F329 Major depressive disorder, single episode, unspecified: Secondary | ICD-10-CM | POA: Diagnosis not present

## 2015-05-29 LAB — HEMOGLOBIN A1C
HEMOGLOBIN A1C: 6 % — AB (ref <5.7)
Mean Plasma Glucose: 126 mg/dL

## 2015-05-29 LAB — CBC
HCT: 48 % (ref 38.5–50.0)
Hemoglobin: 15.9 g/dL (ref 13.2–17.1)
MCH: 30.4 pg (ref 27.0–33.0)
MCHC: 33.1 g/dL (ref 32.0–36.0)
MCV: 91.8 fL (ref 80.0–100.0)
MPV: 11.5 fL (ref 7.5–12.5)
PLATELETS: 223 10*3/uL (ref 140–400)
RBC: 5.23 MIL/uL (ref 4.20–5.80)
RDW: 14.1 % (ref 11.0–15.0)
WBC: 6 10*3/uL (ref 3.8–10.8)

## 2015-05-29 LAB — TSH: TSH: 1.06 mIU/L (ref 0.40–4.50)

## 2015-05-29 NOTE — Progress Notes (Signed)
Subjective:    Patient ID: Matthew Adkins, male    DOB: 26-Aug-1950, 65 y.o.   MRN: [id number]  HPI   Matthew Adkins     MRN: [id number]      DOB: [birth year]   HPI Matthew Adkins is here for follow up and re-evaluation of chronic medical conditions, medication management and review of any available recent lab and radiology data.  Preventive health is updated, specifically  Cancer screening and Immunization.   Questions or concerns regarding consultations or procedures which the PT has had in the interim are  addressed. The PT denies any adverse reactions to current medications since the last visit.  There are no new concerns.  There are no specific complaints   ROS Denies recent fever or chills. Denies sinus pressure, nasal congestion, ear pain or sore throat. Denies chest congestion, productive cough or wheezing. Denies chest pains, palpitations and leg swelling Denies abdominal pain, nausea, vomiting,diarrhea or constipation.   Denies dysuria, frequency, hesitancy or incontinence. Denies joint pain, swelling and limitation in mobility. Denies headaches, seizures, numbness, or tingling. Denies depression, anxiety or insomnia. Denies skin break down or rash.   PE  BP 122/78 mmHg  Pulse 57  Resp 16  Ht 6\' 3"  (1.905 m)  Wt 201 lb (91.173 kg)  BMI 25.12 kg/m2  SpO2 97%  Patient alert and oriented and in no cardiopulmonary distress.  HEENT: No facial asymmetry, EOMI,   oropharynx pink and moist.  Neck supple no JVD, no mass.  Chest: Clear to auscultation bilaterally.  CVS: S1, S2 no murmurs, no S3.Regular rate.  ABD: Soft non tender.   Ext: No edema  MS: Adequate ROM spine, shoulders, hips and knees.  Skin: Intact, no ulcerations or rash noted.  Psych: Good eye contact, normal affect. Memory intact not anxious or depressed appearing.  CNS: CN 2-12 intact, power,  normal throughout.no focal deficits noted.   Assessment & Plan  Depression Controlled, no  change in medication   Dyslipidemia Hyperlipidemia:Low fat diet discussed and encouraged.   Lipid Panel  Lab Results  Component Value Date   CHOL 175 05/29/2015   HDL 63 05/29/2015   LDLCALC 100 05/29/2015   LDLDIRECT 105 08/22/2014   TRIG 62 05/29/2015   CHOLHDL 2.8 05/29/2015        NICOTINE ADDICTION Patient counseled for approximately 5 minutes regarding the health risks of ongoing nicotine use, specifically all types of cancer, heart disease, stroke and respiratory failure. The options available for help with cessation ,the behavioral changes to assist the process, and the option to either gradully reduce usage  Or abruptly stop.is also discussed. Pt is also encouraged to set specific goals in number of cigarettes used daily, as well as to set a quit date.  Number of cigarettes/cigars currently smoking daily: 6 to 8   Prostate cancer S/p total [prostatectomy, and followed by urology, doing well  Prediabetes unchanged Patient educated about the importance of limiting  Carbohydrate intake , the need to commit to daily physical activity for a minimum of 30 minutes , and to commit weight loss. The fact that changes in all these areas will reduce or eliminate all together the development of diabetes is stressed.   Diabetic Labs Latest Ref Rng 05/29/2015 08/30/2014 08/22/2014 07/20/2014 07/18/2014  HbA1c <5.7 % 6.0(H) - 6.0(H) - -  Chol 125 - 200 mg/dL 175 - - - -  HDL >=40 mg/dL 63 - - - -  Calc LDL <130 mg/dL 100 - - - -  Triglycerides <150 mg/dL 62 - - - -  Creatinine 0.70 - 1.25 mg/dL 0.92 0.99 0.94 1.02 1.10   BP/Weight 05/29/2015 05/22/2015 10/11/2014 09/19/2014 09/04/2014 09/03/2014 [id number]  Systolic BP 123XX123 123XX123 A999333 123XX123 Q000111Q - Q000111Q  Diastolic BP 78 69 78 64 68 - 81  Wt. (Lbs) 201 199 186 186 - 193.38 193.4  BMI 25.12 24.87 23.25 23.25 - 24.17 24.17   No flowsheet data found.         Review of Systems     Objective:   Physical Exam        Assessment &  Plan:

## 2015-05-29 NOTE — Patient Instructions (Signed)
Annual physical exam in 3 months  Please work on good  health habits so that your health will improve. 1. Commitment to daily physical activity for 30 to 60  minutes, if you are able to do this.  2. Commitment to wise food choices. Aim for half of your  food intake to be vegetable and fruit, one quarter starchy foods, and one quarter protein. Try to eat on a regular schedule  3 meals per day, snacking between meals should be limited to vegetables or fruits or small portions of nuts. 64 ounces of water per day is generally recommended, unless you have specific health conditions, like heart failure or kidney failure where you will need to limit fluid intake.  3. Commitment to sufficient and a  good quality of physical and mental rest daily, generally between 6 to 8 hours per day.  WITH PERSISTANCE AND PERSEVERANCE, THE IMPOSSIBLE , BECOMES THE NORM!   Thank you  for choosing [facility]. We consider it a privelige to serve you.  Delivering excellent health care in a caring and  compassionate way is our goal.  Partnering with you,  so that together we can achieve this goal is our strategy.     Labs today, cBC, lipid, cmp, HBA1C andTSH and Hep C

## 2015-05-30 LAB — COMPREHENSIVE METABOLIC PANEL
ALK PHOS: 52 U/L (ref 40–115)
ALT: 13 U/L (ref 9–46)
AST: 11 U/L (ref 10–35)
Albumin: 4 g/dL (ref 3.6–5.1)
BILIRUBIN TOTAL: 0.7 mg/dL (ref 0.2–1.2)
BUN: 15 mg/dL (ref 7–25)
CALCIUM: 8.9 mg/dL (ref 8.6–10.3)
CO2: 24 mmol/L (ref 20–31)
CREATININE: 0.92 mg/dL (ref 0.70–1.25)
Chloride: 105 mmol/L (ref 98–110)
GLUCOSE: 87 mg/dL (ref 65–99)
Potassium: 4.4 mmol/L (ref 3.5–5.3)
SODIUM: 139 mmol/L (ref 135–146)
Total Protein: 6.8 g/dL (ref 6.1–8.1)

## 2015-05-30 LAB — LIPID PANEL
CHOLESTEROL: 175 mg/dL (ref 125–200)
HDL: 63 mg/dL (ref >=40)
LDL Cholesterol: 100 mg/dL (ref <130)
Total CHOL/HDL Ratio: 2.8 Ratio (ref <=5.0)
Triglycerides: 62 mg/dL (ref <150)
VLDL: 12 mg/dL (ref <30)

## 2015-05-30 LAB — HEPATITIS C ANTIBODY: HCV Ab: NEGATIVE

## 2015-06-02 ENCOUNTER — Encounter: Payer: Self-pay | Admitting: Family Medicine

## 2015-06-02 NOTE — Assessment & Plan Note (Signed)
unchanged Patient educated about the importance of limiting  Carbohydrate intake , the need to commit to daily physical activity for a minimum of 30 minutes , and to commit weight loss. The fact that changes in all these areas will reduce or eliminate all together the development of diabetes is stressed.   Diabetic Labs Latest Ref Rng 05/29/2015 08/30/2014 08/22/2014 07/20/2014 07/18/2014  HbA1c <5.7 % 6.0(H) - 6.0(H) - -  Chol 125 - 200 mg/dL 175 - - - -  HDL >=40 mg/dL 63 - - - -  Calc LDL <130 mg/dL 100 - - - -  Triglycerides <150 mg/dL 62 - - - -  Creatinine 0.70 - 1.25 mg/dL 0.92 0.99 0.94 1.02 1.10   BP/Weight 05/29/2015 05/22/2015 10/11/2014 09/19/2014 09/04/2014 09/03/2014 [id number]  Systolic BP 123XX123 123XX123 A999333 123XX123 Q000111Q - Q000111Q  Diastolic BP 78 69 78 64 68 - 81  Wt. (Lbs) 201 199 186 186 - 193.38 193.4  BMI 25.12 24.87 23.25 23.25 - 24.17 24.17   No flowsheet data found.

## 2015-06-02 NOTE — Assessment & Plan Note (Signed)
S/p total [prostatectomy, and followed by urology, doing well

## 2015-06-02 NOTE — Assessment & Plan Note (Signed)
Controlled, no change in medication  

## 2015-06-02 NOTE — Assessment & Plan Note (Signed)
Hyperlipidemia:Low fat diet discussed and encouraged.   Lipid Panel  Lab Results  Component Value Date   CHOL 175 05/29/2015   HDL 63 05/29/2015   LDLCALC 100 05/29/2015   LDLDIRECT 105 08/22/2014   TRIG 62 05/29/2015   CHOLHDL 2.8 05/29/2015

## 2015-06-02 NOTE — Assessment & Plan Note (Signed)

## 2015-06-12 ENCOUNTER — Ambulatory Visit (HOSPITAL_COMMUNITY)
Admission: RE | Admit: 2015-06-12 | Discharge: 2015-06-12 | Disposition: A | Discharge status: Home / Self Care | Payer: Medicare Other | Source: Ambulatory Visit | Attending: Acute Care | Admitting: Acute Care

## 2015-06-12 DIAGNOSIS — F17210 Nicotine dependence, cigarettes, uncomplicated: Secondary | ICD-10-CM | POA: Diagnosis not present

## 2015-06-12 DIAGNOSIS — Z122 Encounter for screening for malignant neoplasm of respiratory organs: Secondary | ICD-10-CM | POA: Diagnosis not present

## 2015-06-12 DIAGNOSIS — J432 Centrilobular emphysema: Secondary | ICD-10-CM | POA: Insufficient documentation

## 2015-07-31 ENCOUNTER — Other Ambulatory Visit: Payer: Self-pay | Admitting: Family Medicine

## 2015-08-29 ENCOUNTER — Encounter: Payer: Medicare Other | Admitting: Family Medicine

## 2015-09-04 DIAGNOSIS — C61 Malignant neoplasm of prostate: Secondary | ICD-10-CM | POA: Diagnosis not present

## 2015-10-23 ENCOUNTER — Ambulatory Visit (INDEPENDENT_AMBULATORY_CARE_PROVIDER_SITE_OTHER): Payer: Medicare Other

## 2015-10-23 DIAGNOSIS — Z8701 Personal history of pneumonia (recurrent): Secondary | ICD-10-CM

## 2015-10-23 DIAGNOSIS — Z23 Encounter for immunization: Secondary | ICD-10-CM

## 2015-10-23 DIAGNOSIS — Z9189 Other specified personal risk factors, not elsewhere classified: Principal | ICD-10-CM

## 2015-10-23 NOTE — Addendum Note (Signed)
Addended by: Eual Fines on: 10/23/2015 04:42 PM   Modules accepted: Orders

## 2015-10-30 ENCOUNTER — Encounter: Payer: Self-pay | Admitting: Family Medicine

## 2015-10-30 ENCOUNTER — Ambulatory Visit (INDEPENDENT_AMBULATORY_CARE_PROVIDER_SITE_OTHER): Payer: Medicare Other | Admitting: Family Medicine

## 2015-10-30 VITALS — BP 126/74 | HR 64 | Resp 18 | Ht 75.0 in | Wt 197.0 lb

## 2015-10-30 DIAGNOSIS — M25512 Pain in left shoulder: Secondary | ICD-10-CM | POA: Insufficient documentation

## 2015-10-30 DIAGNOSIS — F17200 Nicotine dependence, unspecified, uncomplicated: Secondary | ICD-10-CM

## 2015-10-30 DIAGNOSIS — R7303 Prediabetes: Secondary | ICD-10-CM

## 2015-10-30 DIAGNOSIS — Z0000 Encounter for general adult medical examination without abnormal findings: Secondary | ICD-10-CM

## 2015-10-30 DIAGNOSIS — Z1211 Encounter for screening for malignant neoplasm of colon: Secondary | ICD-10-CM | POA: Diagnosis not present

## 2015-10-30 LAB — HEMOCCULT GUIAC POC 1CARD (OFFICE): FECAL OCCULT BLD: NEGATIVE

## 2015-10-30 NOTE — Assessment & Plan Note (Signed)

## 2015-10-30 NOTE — Patient Instructions (Addendum)
Annual wellnerss by end of year, call if you need me sooner  MD f/u in 5.5 months hBA1C today   You are referred to Dr Aline Brochure re shoulder pain  VCommit to max of 4 cigarettes daily and continue to worlk on quitting, need to [facility]!     You Can Quit Smoking If you are ready to quit smoking or are thinking about it, congratulations! You have chosen to help yourself be healthier and live longer! There are lots of different ways to quit smoking. Nicotine gum, nicotine patches, a nicotine inhaler, or nicotine nasal spray can help with physical craving. Hypnosis, support groups, and medicines help break the habit of smoking. TIPS TO GET OFF AND STAY OFF CIGARETTES  Learn to predict your moods. Do not let a bad situation be your excuse to have a cigarette. Some situations in your life might tempt you to have a cigarette.  Ask friends and co-workers not to smoke around you.  Make your home smoke-free.  Never have "just one" cigarette. It leads to wanting another and another. Remind yourself of your decision to quit.  On a card, make a list of your reasons for not smoking. Read it at least the same number of times a day as you have a cigarette. Tell yourself everyday, "I do not want to smoke. I choose not to smoke."  Ask someone at home or work to help you with your plan to quit smoking.  Have something planned after you eat or have a cup of coffee. Take a walk or get other exercise to perk you up. This will help to keep you from overeating.  Try a relaxation exercise to calm you down and decrease your stress. Remember, you may be tense and nervous the first two weeks after you quit. This will pass.  Find new activities to keep your hands busy. Play with a pen, coin, or rubber band. Doodle or draw things on paper.  Brush your teeth right after eating. This will help cut down the craving for the taste of tobacco after meals. You can try mouthwash too.  Try gum, breath mints, or diet candy  to keep something in your mouth. IF YOU SMOKE AND WANT TO QUIT:  Do not stock up on cigarettes. Never buy a carton. Wait until one pack is finished before you buy another.  Never carry cigarettes with you at work or at home.  Keep cigarettes as far away from you as possible. Leave them with someone else.  Never carry matches or a lighter with you.  Ask yourself, "Do I need this cigarette or is this just a reflex?"  Bet with someone that you can quit. Put cigarette money in a piggy bank every morning. If you smoke, you give up the money. If you do not smoke, by the end of the week, you keep the money.  Keep trying. It takes 21 days to change a habit!  Talk to your doctor about using medicines to help you quit. These include nicotine replacement gum, lozenges, or skin patches.   This information is not intended to replace advice given to you by your health care provider. Make sure you discuss any questions you have with your health care provider.   Document Released: 10/18/2008 Document Revised: 03/16/2011 Document Reviewed: 10/18/2008 Elsevier Interactive Patient Education [employer].

## 2015-10-30 NOTE — Assessment & Plan Note (Signed)
2 week h/o uncontrolled pain with pop, refer to ortho

## 2015-10-30 NOTE — Progress Notes (Signed)
   RONDALD JANOFF     MRN: [id number]      DOB: Jul 21, 1950   HPI: Patient is in for annual physical exam.  Immunization is reviewed , and  updated if needed. 2 week h/o increased left shoulder pain and reduced mobility, no recent trauma, requests ortho eval    PE; Pleasant male, alert and oriented x 3, in no cardio-pulmonary distress. Afebrile. HEENT No facial trauma or asymetry. Sinuses non tender. EOMI, pupils equally reactive to light. External ears normal, tympanic membranes clear. Oropharynx moist, no exudate. Neck: supple, no adenopathy,JVD or thyromegaly.No bruits.  Chest: Clear to ascultation bilaterally.No crackles or wheezes. Non tender to palpation  Breast: No asymetry,no masses. No nipple discharge or inversion. No axillary or supraclavicular adenopathy  Cardiovascular system; Heart sounds normal,  S1 and  S2 ,no S3.  No murmur, or thrill. Apical beat not displaced Peripheral pulses normal.  Abdomen: Soft, non tender, no organomegaly or masses. No bruits. Bowel sounds normal. No guarding, tenderness or rebound.  Rectal:  Normal sphincter tone. No hemorrhoids or  masses. guaiac negative stool. Prostate removed  Musculoskeletal exam: Full ROM of spine, hips ,  and right knee. Decreased ROM left shoulder and knee. deformity ,swelling and  crepitus noted of the knee No muscle wasting or atrophy.   Neurologic: Cranial nerves 2 to 12 intact. Power, tone ,sensation and reflexes normal throughout. No disturbance in gait. No tremor.  Skin: Intact, no ulceration, erythema , scaling or rash noted. Pigmentation normal throughout  Psych; Normal mood and affect. Judgement and concentration normal   Assessment & Plan:  Annual physical exam Annual exam as documented. Counseling done  re healthy lifestyle involving commitment to 150 minutes exercise per week, heart healthy diet, and attaining healthy weight.The importance of adequate sleep also  discussed. Regular seat belt use and home safety, is also discussed. Changes in health habits are decided on by the patient with goals and time frames  set for achieving them. Immunization and cancer screening needs are specifically addressed at this visit.   NICOTINE ADDICTION Patient counseled for approximately 5 minutes regarding the health risks of ongoing nicotine use, specifically all types of cancer, heart disease, stroke and respiratory failure. The options available for help with cessation ,the behavioral changes to assist the process, and the option to either gradully reduce usage  Or abruptly stop.is also discussed. Pt is also encouraged to set specific goals in number of cigarettes used daily, as well as to set a quit date.     Left shoulder pain 2 week h/o uncontrolled pain with pop, refer to ortho

## 2015-10-30 NOTE — Assessment & Plan Note (Signed)

## 2015-10-31 LAB — HEMOGLOBIN A1C
HEMOGLOBIN A1C: 5.9 % — AB (ref <5.7)
Mean Plasma Glucose: 123 mg/dL

## 2015-11-02 ENCOUNTER — Encounter: Payer: Self-pay | Admitting: Family Medicine

## 2015-11-19 ENCOUNTER — Encounter: Payer: Self-pay | Admitting: Orthopedic Surgery

## 2015-11-19 ENCOUNTER — Ambulatory Visit (INDEPENDENT_AMBULATORY_CARE_PROVIDER_SITE_OTHER): Payer: Medicare Other | Admitting: Orthopedic Surgery

## 2015-11-19 VITALS — BP 119/78 | HR 63 | Ht 75.0 in | Wt 201.0 lb

## 2015-11-19 DIAGNOSIS — M75102 Unspecified rotator cuff tear or rupture of left shoulder, not specified as traumatic: Secondary | ICD-10-CM

## 2015-11-19 DIAGNOSIS — M48061 Spinal stenosis, lumbar region without neurogenic claudication: Secondary | ICD-10-CM

## 2015-11-19 DIAGNOSIS — M170 Bilateral primary osteoarthritis of knee: Secondary | ICD-10-CM | POA: Diagnosis not present

## 2015-11-19 NOTE — Patient Instructions (Signed)

## 2015-11-19 NOTE — Progress Notes (Signed)
Patient ID: Matthew Adkins, male   DOB: [birth year], 65 y.o.   MRN: [id number]  Chief Complaint  Patient presents with  . Follow-up    left shoulder and knee pain    HPI Matthew Adkins is a 65 y.o. male.   HPI F/U  Encounter Diagnoses  Name Primary?  . Spinal stenosis of lumbar region without neurogenic claudication   . Primary osteoarthritis of both knees   . Rotator cuff syndrome of left shoulder Yes   The patient has no current symptoms in his back his knee is stable only having trouble if he twists or turns in an awkward direction  He still having pain in the shoulder worse at night  Review of Systems Review of Systems Fortunately no numbness or tingling in the left arm or lower extremities   Examination BP 119/78   Pulse 63   Ht 6\' 3"  (1.905 m)   Wt 201 lb (91.2 kg)   BMI 25.12 kg/m   Gen. appearance the patient's appearance is normal with normal grooming and  hygiene The patient is oriented to person place and time Mood and affect are normal   Ortho Exam Gait is remarkable for non-antalgic gait Inspection reveals tenderness in the anterolateral acromion ROM is normal in the right shoulder with a positive impingement sign Stability tests are normal  Motor exam 5/5 manual muscle testing , no atrophy  Skin is normal (no rash or erythema)    Medical decision-making Diagnosis, Data, Plan (risk)  Encounter Diagnoses  Name Primary?  . Spinal stenosis of lumbar region without neurogenic claudication   . Primary osteoarthritis of both knees   . Rotator cuff syndrome of left shoulder Yes    Procedure note the subacromial injection shoulder left   Verbal consent was obtained to inject the  Left   Shoulder  Timeout was completed to confirm the injection site is a subacromial space of the  left  shoulder  Medication used Depo-Medrol 40 mg and lidocaine 1% 3 cc  Anesthesia was provided by ethyl chloride  The injection was performed in the left  posterior  subacromial space. After pinning the skin with alcohol and anesthetized the skin with ethyl chloride the subacromial space was injected using a 20-gauge needle. There were no complications  Sterile dressing was applied.  Six-month follow-up  Arther Abbott, MD 11/19/2015 3:38 PM

## 2015-11-21 ENCOUNTER — Ambulatory Visit: Payer: Medicare Other | Admitting: Orthopedic Surgery

## 2015-12-06 ENCOUNTER — Other Ambulatory Visit: Payer: Self-pay | Admitting: Family Medicine

## 2015-12-18 ENCOUNTER — Other Ambulatory Visit: Payer: Self-pay | Admitting: Acute Care

## 2015-12-18 DIAGNOSIS — F17210 Nicotine dependence, cigarettes, uncomplicated: Secondary | ICD-10-CM

## 2016-01-01 ENCOUNTER — Ambulatory Visit (INDEPENDENT_AMBULATORY_CARE_PROVIDER_SITE_OTHER): Payer: Medicare Other

## 2016-01-01 VITALS — BP 130/76 | HR 64 | Temp 98.7°F | Resp 16 | Ht 75.0 in | Wt 200.1 lb

## 2016-01-01 DIAGNOSIS — Z0000 Encounter for general adult medical examination without abnormal findings: Secondary | ICD-10-CM | POA: Diagnosis not present

## 2016-01-01 MED ORDER — FLUOXETINE HCL 20 MG PO CAPS: 20.0000 mg | ORAL_CAPSULE | Freq: Every day | ORAL | 1 refills | Status: DC

## 2016-01-01 NOTE — Patient Instructions (Addendum)
Health maintenance: Up to date  Abnormal screenings: None  Patient concerns: Feels like his Prozac is not working like it used to   Nurse concerns: Dr. Moshe Cipro has increased your Prozac to 20 mg 1 tablet once a day.    Next PCP appt: in 8 weeks with Dr. Moshe Cipro to assess medication increase   Health Maintenance, Male A healthy lifestyle and preventative care can promote health and wellness.  Maintain regular health, dental, and eye exams.  Eat a healthy diet. Foods like vegetables, fruits, whole grains, low-fat dairy products, and lean protein foods contain the nutrients you need and are low in calories. Decrease your intake of foods high in solid fats, added sugars, and salt. Get information about a proper diet from your health care provider, if necessary.  Regular physical exercise is one of the most important things you can do for your health. Most adults should get at least 150 minutes of moderate-intensity exercise (any activity that increases your heart rate and causes you to sweat) each week. In addition, most adults need muscle-strengthening exercises on 2 or more days a week.   Maintain a healthy weight. The body mass index (BMI) is a screening tool to identify possible weight problems. It provides an estimate of body fat based on height and weight. Your health care provider can find your BMI and can help you achieve or maintain a healthy weight. For males 20 years and older:  A BMI below 18.5 is considered underweight.  A BMI of 18.5 to 24.9 is normal.  A BMI of 25 to 29.9 is considered overweight.  A BMI of 30 and above is considered obese.  Maintain normal blood lipids and cholesterol by exercising and minimizing your intake of saturated fat. Eat a balanced diet with plenty of fruits and vegetables. Blood tests for lipids and cholesterol should begin at age 54 and be repeated every 5 years. If your lipid or cholesterol levels are high, you are over age 53, or you are at  high risk for heart disease, you may need your cholesterol levels checked more frequently.Ongoing high lipid and cholesterol levels should be treated with medicines if diet and exercise are not working.  If you smoke, find out from your health care provider how to quit. If you do not use tobacco, do not start.  Lung cancer screening is recommended for adults aged 15-80 years who are at high risk for developing lung cancer because of a history of smoking. A yearly low-dose CT scan of the lungs is recommended for people who have at least a 30-pack-year history of smoking and are current smokers or have quit within the past 15 years. A pack year of smoking is smoking an average of 1 pack of cigarettes a day for 1 year (for example, a 30-pack-year history of smoking could mean smoking 1 pack a day for 30 years or 2 packs a day for 15 years). Yearly screening should continue until the smoker has stopped smoking for at least 15 years. Yearly screening should be stopped for people who develop a health problem that would prevent them from having lung cancer treatment.  If you choose to drink alcohol, do not have more than 2 drinks per day. One drink is considered to be 12 oz (360 mL) of beer, 5 oz (150 mL) of wine, or 1.5 oz (45 mL) of liquor.  Avoid the use of street drugs. Do not share needles with anyone. Ask for help if you need support or  instructions about stopping the use of drugs.  High blood pressure causes heart disease and increases the risk of stroke. High blood pressure is more likely to develop in:  People who have blood pressure in the end of the normal range (100-139/85-89 mm Hg).  People who are overweight or obese.  People who are African American.  If you are 80-56 years of age, have your blood pressure checked every 3-5 years. If you are 20 years of age or older, have your blood pressure checked every year. You should have your blood pressure measured twice-once when you are at a  hospital or clinic, and once when you are not at a hospital or clinic. Record the average of the two measurements. To check your blood pressure when you are not at a hospital or clinic, you can use:  An automated blood pressure machine at a pharmacy.  A home blood pressure monitor.  If you are 18-62 years old, ask your health care provider if you should take aspirin to prevent heart disease.  Diabetes screening involves taking a blood sample to check your fasting blood sugar level. This should be done once every 3 years after age 60 if you are at a normal weight and without risk factors for diabetes. Testing should be considered at a younger age or be carried out more frequently if you are overweight and have at least 1 risk factor for diabetes.  Colorectal cancer can be detected and often prevented. Most routine colorectal cancer screening begins at the age of 64 and continues through age 34. However, your health care provider may recommend screening at an earlier age if you have risk factors for colon cancer. On a yearly basis, your health care provider may provide home test kits to check for hidden blood in the stool. A small camera at the end of a tube may be used to directly examine the colon (sigmoidoscopy or colonoscopy) to detect the earliest forms of colorectal cancer. Talk to your health care provider about this at age 20 when routine screening begins. A direct exam of the colon should be repeated every 5-10 years through age 18, unless early forms of precancerous polyps or small growths are found.  People who are at an increased risk for hepatitis B should be screened for this virus. You are considered at high risk for hepatitis B if:  You were born in a country where hepatitis B occurs often. Talk with your health care provider about which countries are considered high risk.  Your parents were born in a high-risk country and you have not received a shot to protect against hepatitis B  (hepatitis B vaccine).  You have HIV or AIDS.  You use needles to inject street drugs.  You live with, or have sex with, someone who has hepatitis B.  You are a man who has sex with other men (MSM).  You get hemodialysis treatment.  You take certain medicines for conditions like cancer, organ transplantation, and autoimmune conditions.  Hepatitis C blood testing is recommended for all people born from [birth year] through 1967 and any individual with known risk factors for hepatitis C.  Healthy men should no longer receive prostate-specific antigen (PSA) blood tests as part of routine cancer screening. Talk to your health care provider about prostate cancer screening.  Testicular cancer screening is not recommended for adolescents or adult males who have no symptoms. Screening includes self-exam, a health care provider exam, and other screening tests. Consult with your health care  provider about any symptoms you have or any concerns you have about testicular cancer.  Practice safe sex. Use condoms and avoid high-risk sexual practices to reduce the spread of sexually transmitted infections (STIs).  You should be screened for STIs, including gonorrhea and chlamydia if:  You are sexually active and are younger than 24 years.  You are older than 24 years, and your health care provider tells you that you are at risk for this type of infection.  Your sexual activity has changed since you were last screened, and you are at an increased risk for chlamydia or gonorrhea. Ask your health care provider if you are at risk.  If you are at risk of being infected with HIV, it is recommended that you take a prescription medicine daily to prevent HIV infection. This is called pre-exposure prophylaxis (PrEP). You are considered at risk if:  You are a man who has sex with other men (MSM).  You are a heterosexual man who is sexually active with multiple partners.  You take drugs by injection.  You are  sexually active with a partner who has HIV.  Talk with your health care provider about whether you are at high risk of being infected with HIV. If you choose to begin PrEP, you should first be tested for HIV. You should then be tested every 3 months for as long as you are taking PrEP.  Use sunscreen. Apply sunscreen liberally and repeatedly throughout the day. You should seek shade when your shadow is shorter than you. Protect yourself by wearing long sleeves, pants, a wide-brimmed hat, and sunglasses year round whenever you are outdoors.  Tell your health care provider of new moles or changes in moles, especially if there is a change in shape or color. Also, tell your health care provider if a mole is larger than the size of a pencil eraser.  A one-time screening for abdominal aortic aneurysm (AAA) and surgical repair of large AAAs by ultrasound is recommended for men aged 18-75 years who are current or former smokers.  Stay current with your vaccines (immunizations). This information is not intended to replace advice given to you by your health care provider. Make sure you discuss any questions you have with your health care provider. Document Released: 06/20/2007 Document Revised: 01/12/2014 Document Reviewed: 09/25/2014 Elsevier Interactive Patient Education  2017 [employer].   Steps to Quit Smoking Smoking tobacco can be bad for your health. It can also affect almost every organ in your body. Smoking puts you and people around you at risk for many serious long-lasting (chronic) diseases. Quitting smoking is hard, but it is one of the best things that you can do for your health. It is never too late to quit. What are the benefits of quitting smoking? When you quit smoking, you lower your risk for getting serious diseases and conditions. They can include:  Lung cancer or lung disease.  Heart disease.  Stroke.  Heart attack.  Not being able to have children (infertility).  Weak  bones (osteoporosis) and broken bones (fractures). If you have coughing, wheezing, and shortness of breath, those symptoms may get better when you quit. You may also get sick less often. If you are pregnant, quitting smoking can help to lower your chances of having a baby of low birth weight. What can I do to help me quit smoking? Talk with your doctor about what can help you quit smoking. Some things you can do (strategies) include:  Quitting smoking totally,  instead of slowly cutting back how much you smoke over a period of time.  Going to in-person counseling. You are more likely to quit if you go to many counseling sessions.  Using resources and support systems, such as:  Online chats with a Social worker.  Phone quitlines.  Printed Furniture conservator/restorer.  Support groups or group counseling.  Text messaging programs.  Mobile phone apps or applications.  Taking medicines. Some of these medicines may have nicotine in them. If you are pregnant or breastfeeding, do not take any medicines to quit smoking unless your doctor says it is okay. Talk with your doctor about counseling or other things that can help you. Talk with your doctor about using more than one strategy at the same time, such as taking medicines while you are also going to in-person counseling. This can help make quitting easier. What things can I do to make it easier to quit? Quitting smoking might feel very hard at first, but there is a lot that you can do to make it easier. Take these steps:  Talk to your family and friends. Ask them to support and encourage you.  Call phone quitlines, reach out to support groups, or work with a Social worker.  Ask people who smoke to not smoke around you.  Avoid places that make you want (trigger) to smoke, such as:  Bars.  Parties.  Smoke-break areas at work.  Spend time with people who do not smoke.  Lower the stress in your life. Stress can make you want to smoke. Try these  things to help your stress:  Getting regular exercise.  Deep-breathing exercises.  Yoga.  Meditating.  Doing a body scan. To do this, close your eyes, focus on one area of your body at a time from head to toe, and notice which parts of your body are tense. Try to relax the muscles in those areas.  Download or buy apps on your mobile phone or tablet that can help you stick to your quit plan. There are many free apps, such as QuitGuide from the [employer] Office manager for Disease Control and Prevention). You can find more support from smokefree.gov and other websites. This information is not intended to replace advice given to you by your health care provider. Make sure you discuss any questions you have with your health care provider. Document Released: 10/18/2008 Document Revised: 08/20/2015 Document Reviewed: 05/08/2014 Elsevier Interactive Patient Education  2017 [employer].

## 2016-01-01 NOTE — Progress Notes (Signed)
Subjective:   Matthew Adkins is a 65 y.o. male who presents for Medicare Annual/Subsequent preventive examination.  Review of Systems:  Cardiac Risk Factors include: advanced age (>80men, >62 women);dyslipidemia;family history of premature cardiovascular disease;male gender;sedentary lifestyle;smoking/ tobacco exposure     Objective:    Vitals: BP 130/76   Pulse 64   Temp 98.7 F (37.1 C) (Oral)   Resp 16   Ht 6\' 3"  (1.905 m)   Wt 200 lb 1.3 oz (90.8 kg)   SpO2 96%   BMI 25.01 kg/m   Body mass index is 25.01 kg/m.  Tobacco History  Smoking Status  . Current Every Day Smoker  . Packs/day: 0.50  . Years: 30.00  . Types: Cigarettes  Smokeless Tobacco  . Never Used     Ready to quit: Yes Counseling given: Yes   Past Medical History:  Diagnosis Date  . Bladder cancer ([place]) 2000  . Depression   . Nicotine addiction   . Prostate cancer ([place]) 07/06/2014  . Seasonal allergies    Past Surgical History:  Procedure Laterality Date  . athroscopy of right knee  2002  . cancerous polyps removed from bladder  2000  . COLONOSCOPY  MJ 2009 pmhX: POLYPS   POLYP REMOVED BUT NOT RETRIEVED  . COLONOSCOPY  LS 2007 ARS   ? POLYPS, no path available  . COLONOSCOPY  09/26/2010   sessile poylp(2) pan-colnoic diverticulosis,mild/internal hemorrhoids  . FOOT SURGERY  2004  . HERNIA REPAIR  1980's   left inguinal hernia  . LYMPHADENECTOMY Bilateral 09/03/2014   Procedure: PELVIC LYMPHADENECTOMY;  Surgeon: Raynelle Bring, MD;  Location: [facility];  Service: Urology;  Laterality: Bilateral;  . PROSTATE BIOPSY  07/06/14  . ROBOT ASSISTED LAPAROSCOPIC RADICAL PROSTATECTOMY N/A 09/03/2014   Procedure: ROBOTIC ASSISTED LAPAROSCOPIC RADICAL PROSTATECTOMY LEVEL 2;  Surgeon: Raynelle Bring, MD;  Location: [facility];  Service: Urology;  Laterality: N/A;   Family History  Problem Relation Age of Onset  . Heart attack Father   . Diabetes Sister   . Hypertension Sister   . Hypertension Sister   .  Aneurysm Sister     brain   . Arthritis      family history   . Colon cancer Neg Hx   . Colon polyps Neg Hx    History  Sexual Activity  . Sexual activity: Not on file    Outpatient Encounter Prescriptions as of 01/01/2016  Medication Sig  . acetaminophen (TYLENOL) 500 MG tablet Take 1,000 mg by mouth every 6 (six) hours as needed for mild pain.  Marland Kitchen aspirin 81 MG tablet Take 81 mg by mouth daily.  Marland Kitchen FLUoxetine (PROZAC) 20 MG capsule Take 1 capsule (20 mg total) by mouth daily.  . Multiple Vitamins-Minerals (ONE DAILY MENS 50+ MULTIVIT PO) Take 1 tablet by mouth daily.  . [DISCONTINUED] FLUoxetine (PROZAC) 10 MG capsule TAKE 1 CAPSULE BY MOUTH DAILY.   No facility-administered encounter medications on file as of 01/01/2016.     Activities of Daily Living In your present state of health, do you have any difficulty performing the following activities: 01/01/2016 10/30/2015  Hearing? N N  Vision? N N  Difficulty concentrating or making decisions? Y N  Walking or climbing stairs? N N  Dressing or bathing? N N  Doing errands, shopping? N N  Preparing Food and eating ? N -  Using the Toilet? N -  In the past six months, have you accidently leaked urine? N -  Do you have  problems with loss of bowel control? N -  Managing your Medications? N -  Managing your Finances? N -  Housekeeping or managing your Housekeeping? N -  Some recent data might be hidden    Patient Care Team: Fayrene Helper, MD as PCP - [place], MD (Gastroenterology)   Assessment:    Exercise Activities and Dietary recommendations Current Exercise Habits: The patient does not participate in regular exercise at present, Exercise limited by: None identified  Goals    . Quit smoking / using tobacco          Starting 01/06/2016 patient would like to quit smoking.      Fall Risk Fall Risk  01/01/2016 10/30/2015 07/31/2014 05/30/2014  Falls in the past year? No No No No   Depression  Screen PHQ 2/9 Scores 01/01/2016 07/31/2014 05/30/2014 01/31/2013  PHQ - 2 Score 1 0 0 3  PHQ- 9 Score - - 1 5    Cognitive Function  Normal   6CIT Screen 01/01/2016  What Year? 0 points  What month? 0 points  What time? 0 points  Count back from 20 0 points  Months in reverse 0 points  Repeat phrase 0 points  Total Score 0    Immunization History  Administered Date(s) Administered  . Influenza Split 09/20/2010, 10/06/2011  . Influenza Whole 10/02/2008, 09/19/2009  . Influenza,inj,Quad PF,36+ Mos 09/19/2014, 10/23/2015  . Pneumococcal Conjugate-13 05/30/2014  . Pneumococcal Polysaccharide-23 10/23/2015  . Td 10/21/2009  . Zoster 06/25/2010   Screening Tests Health Maintenance  Topic Date Due  . TETANUS/TDAP  10/22/2019  . COLONOSCOPY  09/25/2020  . INFLUENZA VACCINE  Completed  . ZOSTAVAX  Completed  . Hepatitis C Screening  Completed  . PNA vac Low Risk Adult  Completed      Plan:  I have personally reviewed and addressed the Medicare Annual Wellness questionnaire and have noted the following in the patient's chart:  A. Medical and social history B. Use of alcohol, tobacco or illicit drugs  C. Current medications and supplements D. Functional ability and status E.  Nutritional status F.  Physical activity G. Advance directives H. List of other physicians I.  Hospitalizations, surgeries, and ER visits in previous 12 months J.  [place] to include hearing, vision, cognitive, depression L. Referrals and appointments   In addition, I have reviewed and discussed with patient certain preventive protocols, quality metrics, and best practice recommendations. A written personalized care plan for preventive services as well as general preventive health recommendations were provided to patient.  Signed,   Stormy Fabian, LPN Lead Nurse Health Advisor

## 2016-01-02 ENCOUNTER — Ambulatory Visit: Payer: Medicare Other

## 2016-02-26 ENCOUNTER — Ambulatory Visit: Payer: Medicare Other | Admitting: Family Medicine

## 2016-03-07 ENCOUNTER — Other Ambulatory Visit: Payer: Self-pay | Admitting: Family Medicine

## 2016-03-16 ENCOUNTER — Ambulatory Visit: Payer: Medicare Other | Admitting: Family Medicine

## 2016-05-06 ENCOUNTER — Ambulatory Visit (INDEPENDENT_AMBULATORY_CARE_PROVIDER_SITE_OTHER): Payer: Medicare Other | Admitting: Family Medicine

## 2016-05-06 ENCOUNTER — Encounter: Payer: Self-pay | Admitting: Family Medicine

## 2016-05-06 VITALS — BP 120/62 | HR 60 | Temp 97.6°F | Resp 16 | Ht 75.0 in | Wt 202.0 lb

## 2016-05-06 DIAGNOSIS — M25512 Pain in left shoulder: Secondary | ICD-10-CM

## 2016-05-06 DIAGNOSIS — M5136 Other intervertebral disc degeneration, lumbar region: Secondary | ICD-10-CM | POA: Diagnosis not present

## 2016-05-06 DIAGNOSIS — G8929 Other chronic pain: Secondary | ICD-10-CM | POA: Diagnosis not present

## 2016-05-06 DIAGNOSIS — F3289 Other specified depressive episodes: Secondary | ICD-10-CM | POA: Diagnosis not present

## 2016-05-06 DIAGNOSIS — R7303 Prediabetes: Secondary | ICD-10-CM | POA: Diagnosis not present

## 2016-05-06 DIAGNOSIS — E785 Hyperlipidemia, unspecified: Secondary | ICD-10-CM

## 2016-05-06 DIAGNOSIS — F17200 Nicotine dependence, unspecified, uncomplicated: Secondary | ICD-10-CM

## 2016-05-06 NOTE — Patient Instructions (Addendum)
Annual physical exam October 26 or after, call if you need me sooner  Please continue to work on stopping smoking, need to qUIT  No changes in medication  Keep on doing best you can, and make  some time for yourself also  PLEASEschedule urology follow up appt   CBC, fasting lipid, cmp and EGFr, TSH and HBA1C first week in June please   Steps to Quit Smoking Smoking tobacco can be bad for your health. It can also affect almost every organ in your body. Smoking puts you and people around you at risk for many serious long-lasting (chronic) diseases. Quitting smoking is hard, but it is one of the best things that you can do for your health. It is never too late to quit. What are the benefits of quitting smoking? When you quit smoking, you lower your risk for getting serious diseases and conditions. They can include:  Lung cancer or lung disease.  Heart disease.  Stroke.  Heart attack.  Not being able to have children (infertility).  Weak bones (osteoporosis) and broken bones (fractures). If you have coughing, wheezing, and shortness of breath, those symptoms may get better when you quit. You may also get sick less often. If you are pregnant, quitting smoking can help to lower your chances of having a baby of low birth weight. What can I do to help me quit smoking? Talk with your doctor about what can help you quit smoking. Some things you can do (strategies) include:  Quitting smoking totally, instead of slowly cutting back how much you smoke over a period of time.  Going to in-person counseling. You are more likely to quit if you go to many counseling sessions.  Using resources and support systems, such as:  Online chats with a Social worker.  Phone quitlines.  Printed Furniture conservator/restorer.  Support groups or group counseling.  Text messaging programs.  Mobile phone apps or applications.  Taking medicines. Some of these medicines may have nicotine in them. If you are  pregnant or breastfeeding, do not take any medicines to quit smoking unless your doctor says it is okay. Talk with your doctor about counseling or other things that can help you. Talk with your doctor about using more than one strategy at the same time, such as taking medicines while you are also going to in-person counseling. This can help make quitting easier. What things can I do to make it easier to quit? Quitting smoking might feel very hard at first, but there is a lot that you can do to make it easier. Take these steps:  Talk to your family and friends. Ask them to support and encourage you.  Call phone quitlines, reach out to support groups, or work with a Social worker.  Ask people who smoke to not smoke around you.  Avoid places that make you want (trigger) to smoke, such as:  Bars.  Parties.  Smoke-break areas at work.  Spend time with people who do not smoke.  Lower the stress in your life. Stress can make you want to smoke. Try these things to help your stress:  Getting regular exercise.  Deep-breathing exercises.  Yoga.  Meditating.  Doing a body scan. To do this, close your eyes, focus on one area of your body at a time from head to toe, and notice which parts of your body are tense. Try to relax the muscles in those areas.  Download or buy apps on your mobile phone or tablet that can help you  stick to your quit plan. There are many free apps, such as QuitGuide from the [employer] Office manager for Disease Control and Prevention). You can find more support from smokefree.gov and other websites. This information is not intended to replace advice given to you by your health care provider. Make sure you discuss any questions you have with your health care provider. Document Released: 10/18/2008 Document Revised: 08/20/2015 Document Reviewed: 05/08/2014 Elsevier Interactive Patient Education  2017 [employer].

## 2016-05-06 NOTE — Progress Notes (Signed)
Matthew Adkins     MRN: [id number]      DOB: 25-Oct-1950   HPI Matthew Adkins is here for follow up and re-evaluation of chronic medical conditions, medication management and review of any available recent lab and radiology data.  Preventive health is updated, specifically  Cancer screening and Immunization.   Questions or concerns regarding consultations or procedures which the PT has had in the interim are  addressed. The PT denies any adverse reactions to current medications since the last visit.     ROS Denies recent fever or chills. Denies sinus pressure, nasal congestion, ear pain or sore throat. Denies chest congestion, productive cough or wheezing. Denies chest pains, palpitations and leg swelling Denies abdominal pain, nausea, vomiting,diarrhea or constipation.   Denies dysuria, frequency, hesitancy or incontinence. Denies joint pain, swelling and limitation in mobility. Denies headaches, seizures, numbness, or tingling. Denies depression, does have increased personal stress caring for his wife and sister , but is dealing with the best he can, denies  insomnia. Denies skin break down or rash.   PE  BP 120/62 (BP Location: Left Arm, Patient Position: Sitting, Cuff Size: Large)   Pulse 60   Temp 97.6 F (36.4 C) (Temporal)   Resp 16   Ht 6\' 3"  (1.905 m)   Wt 202 lb (91.6 kg)   SpO2 97%   BMI 25.25 kg/m   Patient alert and oriented and in no cardiopulmonary distress.  HEENT: No facial asymmetry, EOMI,   oropharynx pink and moist.  Neck supple no JVD, no mass.  Chest: Clear to auscultation bilaterally.Decreased air entry   CVS: S1, S2 no murmurs, no S3.Regular rate.  ABD: Soft non tender.   Ext: No edema  MS: Adequate ROM spine, shoulders, hips and knees.  Skin: Intact, no ulcerations or rash noted.  Psych: Good eye contact, normal affect. Memory intact not anxious or depressed appearing.  CNS: CN 2-12 intact, power,  normal throughout.no focal deficits  noted.   Assessment & Plan  Depression Controlled, no change in medication   NICOTINE ADDICTION Patient counseled for approximately 5 minutes regarding the health risks of ongoing nicotine use, specifically all types of cancer, heart disease, stroke and respiratory failure. The options available for help with cessation ,the behavioral changes to assist the process, and the option to either gradully reduce usage  Or abruptly stop.is also discussed. Pt is also encouraged to set specific goals in number of cigarettes used daily, as well as to set a quit date.     Prediabetes Patient educated about the importance of limiting  Carbohydrate intake , the need to commit to daily physical activity for a minimum of 30 minutes , and to commit weight loss. The fact that changes in all these areas will reduce or eliminate all together the development of diabetes is stressed.  Updated lab needed at/ before next visit.   Diabetic Labs Latest Ref Rng & Units 10/30/2015 05/29/2015 08/30/2014 08/22/2014 07/20/2014  HbA1c <5.7 % 5.9(H) 6.0(H) - 6.0(H) -  Chol 125 - 200 mg/dL - 175 - - -  HDL >=40 mg/dL - 63 - - -  Calc LDL <130 mg/dL - 100 - - -  Triglycerides <150 mg/dL - 62 - - -  Creatinine 0.70 - 1.25 mg/dL - 0.92 0.99 0.94 1.02   BP/Weight 05/06/2016 01/01/2016 11/19/2015 10/30/2015 05/29/2015 05/22/2015 00/08/6759  Systolic BP 950 932 671 245 809 983 382  Diastolic BP 62 76 78 74 78 69 78  Wt. (  Lbs) 202 200.08 201 197 201 199 186  BMI 25.25 25.01 25.12 24.62 25.12 24.87 23.25   No flowsheet data found.    Dyslipidemia Hyperlipidemia:Low fat diet discussed and encouraged.   Lipid Panel  Lab Results  Component Value Date   CHOL 175 05/29/2015   HDL 63 05/29/2015   LDLCALC 100 05/29/2015   LDLDIRECT 105 08/22/2014   TRIG 62 05/29/2015   CHOLHDL 2.8 05/29/2015   Updated lab needed at/ before next visit.     Left shoulder pain [facility]  Degenerative disc disease, lumbar Mild to  moderate pain , use tylenol

## 2016-05-08 NOTE — Assessment & Plan Note (Signed)

## 2016-05-08 NOTE — Assessment & Plan Note (Signed)
Patient educated about the importance of limiting  Carbohydrate intake , the need to commit to daily physical activity for a minimum of 30 minutes , and to commit weight loss. The fact that changes in all these areas will reduce or eliminate all together the development of diabetes is stressed.  Updated lab needed at/ before next visit.   Diabetic Labs Latest Ref Rng & Units 10/30/2015 05/29/2015 08/30/2014 08/22/2014 07/20/2014  HbA1c <5.7 % 5.9(H) 6.0(H) - 6.0(H) -  Chol 125 - 200 mg/dL - 175 - - -  HDL >=40 mg/dL - 63 - - -  Calc LDL <130 mg/dL - 100 - - -  Triglycerides <150 mg/dL - 62 - - -  Creatinine 0.70 - 1.25 mg/dL - 0.92 0.99 0.94 1.02   BP/Weight 05/06/2016 01/01/2016 11/19/2015 10/30/2015 05/29/2015 05/22/2015 67/07/338  Systolic BP 352 481 859 093 112 162 446  Diastolic BP 62 76 78 74 78 69 78  Wt. (Lbs) 202 200.08 201 197 201 199 186  BMI 25.25 25.01 25.12 24.62 25.12 24.87 23.25   No flowsheet data found.

## 2016-05-08 NOTE — Assessment & Plan Note (Signed)
Hyperlipidemia:Low fat diet discussed and encouraged.   Lipid Panel  Lab Results  Component Value Date   CHOL 175 05/29/2015   HDL 63 05/29/2015   LDLCALC 100 05/29/2015   LDLDIRECT 105 08/22/2014   TRIG 62 05/29/2015   CHOLHDL 2.8 05/29/2015   Updated lab needed at/ before next visit.

## 2016-05-08 NOTE — Assessment & Plan Note (Signed)
Controlled, no change in medication  

## 2016-05-08 NOTE — Assessment & Plan Note (Signed)
[facility]

## 2016-05-08 NOTE — Assessment & Plan Note (Signed)
Mild to moderate pain , use tylenol

## 2016-05-18 ENCOUNTER — Ambulatory Visit: Payer: Medicare Other | Admitting: Orthopedic Surgery

## 2016-05-26 ENCOUNTER — Ambulatory Visit: Payer: Medicare Other | Admitting: Orthopedic Surgery

## 2016-06-09 ENCOUNTER — Ambulatory Visit: Payer: Medicare Other | Admitting: Orthopedic Surgery

## 2016-06-12 ENCOUNTER — Ambulatory Visit (HOSPITAL_COMMUNITY)
Admission: RE | Admit: 2016-06-12 | Discharge: 2016-06-12 | Disposition: A | Discharge status: Home / Self Care | Payer: Medicare Other | Source: Ambulatory Visit | Attending: Acute Care | Admitting: Acute Care

## 2016-06-12 DIAGNOSIS — Z122 Encounter for screening for malignant neoplasm of respiratory organs: Secondary | ICD-10-CM | POA: Diagnosis not present

## 2016-06-12 DIAGNOSIS — I700 Atherosclerosis of aorta: Secondary | ICD-10-CM | POA: Insufficient documentation

## 2016-06-12 DIAGNOSIS — J439 Emphysema, unspecified: Secondary | ICD-10-CM | POA: Diagnosis not present

## 2016-06-12 DIAGNOSIS — F17210 Nicotine dependence, cigarettes, uncomplicated: Secondary | ICD-10-CM | POA: Diagnosis present

## 2016-06-22 ENCOUNTER — Other Ambulatory Visit: Payer: Self-pay | Admitting: Acute Care

## 2016-06-22 DIAGNOSIS — F17210 Nicotine dependence, cigarettes, uncomplicated: Principal | ICD-10-CM

## 2016-06-30 ENCOUNTER — Other Ambulatory Visit (HOSPITAL_COMMUNITY)
Admission: RE | Admit: 2016-06-30 | Discharge: 2016-06-30 | Disposition: A | Discharge status: Home / Self Care | Payer: Medicare Other | Source: Ambulatory Visit | Attending: Family Medicine | Admitting: Family Medicine

## 2016-06-30 DIAGNOSIS — E785 Hyperlipidemia, unspecified: Secondary | ICD-10-CM | POA: Insufficient documentation

## 2016-06-30 DIAGNOSIS — R7303 Prediabetes: Secondary | ICD-10-CM | POA: Diagnosis present

## 2016-06-30 LAB — COMPREHENSIVE METABOLIC PANEL
ALBUMIN: 3.9 g/dL (ref 3.5–5.0)
ALT: 18 U/L (ref 17–63)
ANION GAP: 6 (ref 5–15)
AST: 17 U/L (ref 15–41)
Alkaline Phosphatase: 54 U/L (ref 38–126)
BUN: 17 mg/dL (ref 6–20)
CO2: 26 mmol/L (ref 22–32)
Calcium: 8.7 mg/dL — ABNORMAL LOW (ref 8.9–10.3)
Chloride: 106 mmol/L (ref 101–111)
Creatinine, Ser: 1.03 mg/dL (ref 0.61–1.24)
GFR calc Af Amer: >60 mL/min (ref >60)
GFR calc non Af Amer: >60 mL/min (ref >60)
GLUCOSE: 108 mg/dL — AB (ref 65–99)
POTASSIUM: 3.9 mmol/L (ref 3.5–5.1)
SODIUM: 138 mmol/L (ref 135–145)
TOTAL PROTEIN: 7.2 g/dL (ref 6.5–8.1)
Total Bilirubin: 0.9 mg/dL (ref 0.3–1.2)

## 2016-06-30 LAB — CBC
HEMATOCRIT: 44.8 % (ref 39.0–52.0)
HEMOGLOBIN: 15.1 g/dL (ref 13.0–17.0)
MCH: 30.9 pg (ref 26.0–34.0)
MCHC: 33.7 g/dL (ref 30.0–36.0)
MCV: 91.6 fL (ref 78.0–100.0)
Platelets: 208 10*3/uL (ref 150–400)
RBC: 4.89 MIL/uL (ref 4.22–5.81)
RDW: 14.1 % (ref 11.5–15.5)
WBC: 6 10*3/uL (ref 4.0–10.5)

## 2016-06-30 LAB — LIPID PANEL
CHOL/HDL RATIO: 3.6 ratio
Cholesterol: 178 mg/dL (ref 0–200)
HDL: 49 mg/dL (ref >40)
LDL CALC: 115 mg/dL — AB (ref 0–99)
Triglycerides: 70 mg/dL (ref <150)
VLDL: 14 mg/dL (ref 0–40)

## 2016-06-30 LAB — TSH: TSH: 0.778 u[IU]/mL (ref 0.350–4.500)

## 2016-07-03 LAB — HEMOGLOBIN A1C
HEMOGLOBIN A1C: 6 % — AB (ref 4.8–5.6)
Mean Plasma Glucose: 126 mg/dL

## 2016-09-18 ENCOUNTER — Other Ambulatory Visit: Payer: Self-pay | Admitting: Family Medicine

## 2016-09-21 NOTE — Telephone Encounter (Signed)
Seen 5 2 18

## 2016-10-07 ENCOUNTER — Ambulatory Visit (INDEPENDENT_AMBULATORY_CARE_PROVIDER_SITE_OTHER): Payer: Medicare Other

## 2016-10-07 DIAGNOSIS — Z23 Encounter for immunization: Secondary | ICD-10-CM | POA: Diagnosis not present

## 2016-11-02 ENCOUNTER — Encounter: Payer: Medicare Other | Admitting: Family Medicine

## 2016-11-14 ENCOUNTER — Other Ambulatory Visit: Payer: Self-pay

## 2016-11-14 ENCOUNTER — Emergency Department (HOSPITAL_COMMUNITY)
Admission: EM | Admit: 2016-11-14 | Discharge: 2016-11-14 | Disposition: A | Discharge status: Home / Self Care | Payer: Medicare Other | Attending: Emergency Medicine | Admitting: Emergency Medicine

## 2016-11-14 ENCOUNTER — Encounter (HOSPITAL_COMMUNITY): Payer: Self-pay | Admitting: Emergency Medicine

## 2016-11-14 DIAGNOSIS — R51 Headache: Secondary | ICD-10-CM | POA: Diagnosis present

## 2016-11-14 DIAGNOSIS — F17210 Nicotine dependence, cigarettes, uncomplicated: Secondary | ICD-10-CM | POA: Insufficient documentation

## 2016-11-14 DIAGNOSIS — R5383 Other fatigue: Secondary | ICD-10-CM | POA: Diagnosis not present

## 2016-11-14 DIAGNOSIS — I10 Essential (primary) hypertension: Secondary | ICD-10-CM | POA: Diagnosis not present

## 2016-11-14 LAB — CBC WITH DIFFERENTIAL/PLATELET
BASOS PCT: 1 %
Basophils Absolute: 0 10*3/uL (ref 0.0–0.1)
Eosinophils Absolute: 0.1 10*3/uL (ref 0.0–0.7)
Eosinophils Relative: 2 %
HEMATOCRIT: 43.4 % (ref 39.0–52.0)
Hemoglobin: 14.6 g/dL (ref 13.0–17.0)
Lymphocytes Relative: 24 %
Lymphs Abs: 1.7 10*3/uL (ref 0.7–4.0)
MCH: 31.6 pg (ref 26.0–34.0)
MCHC: 33.6 g/dL (ref 30.0–36.0)
MCV: 93.9 fL (ref 78.0–100.0)
MONO ABS: 0.7 10*3/uL (ref 0.1–1.0)
MONOS PCT: 10 %
NEUTROS ABS: 4.4 10*3/uL (ref 1.7–7.7)
Neutrophils Relative %: 63 %
PLATELETS: 190 10*3/uL (ref 150–400)
RBC: 4.62 MIL/uL (ref 4.22–5.81)
RDW: 13.5 % (ref 11.5–15.5)
WBC: 6.9 10*3/uL (ref 4.0–10.5)

## 2016-11-14 LAB — URINALYSIS, ROUTINE W REFLEX MICROSCOPIC
BILIRUBIN URINE: NEGATIVE
GLUCOSE, UA: NEGATIVE mg/dL
Hgb urine dipstick: NEGATIVE
KETONES UR: NEGATIVE mg/dL
Leukocytes, UA: NEGATIVE
NITRITE: NEGATIVE
PH: 5 (ref 5.0–8.0)
Protein, ur: NEGATIVE mg/dL
Specific Gravity, Urine: 1.021 (ref 1.005–1.030)

## 2016-11-14 LAB — BASIC METABOLIC PANEL
ANION GAP: 6 (ref 5–15)
BUN: 13 mg/dL (ref 6–20)
CALCIUM: 8.6 mg/dL — AB (ref 8.9–10.3)
CO2: 25 mmol/L (ref 22–32)
Chloride: 104 mmol/L (ref 101–111)
Creatinine, Ser: 0.91 mg/dL (ref 0.61–1.24)
GFR calc non Af Amer: >60 mL/min (ref >60)
GLUCOSE: 94 mg/dL (ref 65–99)
Potassium: 4.2 mmol/L (ref 3.5–5.1)
SODIUM: 135 mmol/L (ref 135–145)

## 2016-11-14 NOTE — Discharge Instructions (Signed)

## 2016-11-14 NOTE — ED Triage Notes (Signed)
Patient c/o hypertension. Patient states started getting a headache yesterday and not feeling well. Patient took blood pressure at [facility] and it was elevated. Patient denies hx of hypertension.

## 2016-11-14 NOTE — ED Provider Notes (Signed)
Emergency Department Provider Note   I have reviewed the triage vital signs and the nursing notes.   HISTORY  Chief Complaint Hypertension   HPI Matthew Adkins is a 66 y.o. male with no significant PMH resents to the emergency department for evaluation of burning headache and generally not feeling well.  Patient states that yesterday he developed a diffuse headache that felt like burning all over.  No radiation of symptoms.  No sudden onset, maximal intensity symptoms.  Denies any fevers or chills.  States that he took Tylenol and laid down and felt better afterwards.  His headache is currently resolved but he feels more fatigued and generally not well.  He denies any chest pain or dyspnea.  No abdominal discomfort.  He went to [facility] to check his blood pressure and found it to be in the 160s so presented to the emergency department.  He has no history of hypertension is never been on high blood pressure medication. His only OTC medication was Mucinex DM.    Past Medical History:  Diagnosis Date  . Bladder cancer ([place]) 2000  . Depression   . Nicotine addiction   . Prostate cancer ([place]) 07/06/2014  . Seasonal allergies     Patient Active Problem List   Diagnosis Date Noted  . Left shoulder pain 10/30/2015  . Prostate cancer ([place]) 07/25/2014  . Hemorrhoids, internal 09/30/2011  . Arthritis of knee, degenerative 09/30/2011  . Degenerative disc disease, lumbar 09/30/2011  . Pulmonary nodule 09/21/2011  . Depression 06/25/2010  . SKIN TAG 10/21/2009  . ALLERGIC RHINITIS 09/19/2009  . Dyslipidemia 08/17/2008  . Prediabetes 10/14/2007  . BLADDER CANCER 04/13/2007  . NICOTINE ADDICTION 04/13/2007    Past Surgical History:  Procedure Laterality Date  . athroscopy of right knee  2002  . cancerous polyps removed from bladder  2000  . COLONOSCOPY  MJ 2009 pmhX: POLYPS   POLYP REMOVED BUT NOT RETRIEVED  . COLONOSCOPY  LS 2007 ARS   ? POLYPS, no path available  . FOOT  SURGERY  2004  . HERNIA REPAIR  1980's   left inguinal hernia  . PROSTATE BIOPSY  07/06/14    Current Outpatient Rx  . Order #: [id number] Class: Historical Med  . Order #: [id number] Class: Historical Med  . Order #: [id number] Class: Normal  . Order #: [id number] Class: Historical Med    Allergies Bee venom and Codeine  Family History  Problem Relation Age of Onset  . Heart attack Father   . Diabetes Sister   . Hypertension Sister   . Hypertension Sister   . Aneurysm Sister        brain   . Arthritis Unknown        family history   . Colon cancer Neg Hx   . Colon polyps Neg Hx     Social History Social History   Tobacco Use  . Smoking status: Current Every Day Smoker    Packs/day: 0.50    Years: 30.00    Pack years: 15.00    Types: Cigarettes  . Smokeless tobacco: Never Used  Substance Use Topics  . Alcohol use: Yes    Comment: 2 pints/month  . Drug use: Yes    Types: Marijuana    Comment: occassionally    Review of Systems  Constitutional: No fever/chills. Positive generalized fatigue.  Eyes: No visual changes. ENT: No sore throat. Cardiovascular: Denies chest pain. Respiratory: Denies shortness of breath. Gastrointestinal: No abdominal pain.  No nausea, no vomiting.  No  diarrhea.  No constipation. Genitourinary: Negative for dysuria. Musculoskeletal: Negative for back pain. Skin: Negative for rash. Neurological: Negative for focal weakness or numbness. Positive HA (resolved).   10-point ROS otherwise negative.  ____________________________________________   PHYSICAL EXAM:  VITAL SIGNS: ED Triage Vitals  Enc Vitals Group     BP 11/14/16 1656 (!) 153/94     Pulse Rate 11/14/16 1656 60     Resp 11/14/16 1656 18     Temp 11/14/16 1656 97.6 F (36.4 C)     Temp Source 11/14/16 1656 Oral     SpO2 11/14/16 1656 100 %     Weight 11/14/16 1656 200 lb (90.7 kg)     Height 11/14/16 1656 6\' 3"  (1.905 m)     Pain Score 11/14/16 1655 5   Constitutional:  Alert and oriented. Well appearing and in no acute distress. Eyes: Conjunctivae are normal. PERRL. EOMI. Head: Atraumatic. Nose: No congestion/rhinnorhea. Mouth/Throat: Mucous membranes are moist.   Neck: No stridor.  Cardiovascular: Normal rate, regular rhythm. Good peripheral circulation. Grossly normal heart sounds.   Respiratory: Normal respiratory effort.  No retractions. Lungs CTAB. Gastrointestinal: Soft and nontender. No distention.  Musculoskeletal: No lower extremity tenderness nor edema. No gross deformities of extremities. Neurologic:  Normal speech and language. No gross focal neurologic deficits are appreciated. Normal CN exam 2-12. No pronator drift.   Skin:  Skin is warm, dry and intact. No rash noted.  ____________________________________________   LABS (all labs ordered are listed, but only abnormal results are displayed)  Labs Reviewed  BASIC METABOLIC PANEL - Abnormal; Notable for the following components:      Result Value   Calcium 8.6 (*)    All other components within normal limits  CBC WITH DIFFERENTIAL/PLATELET  URINALYSIS, ROUTINE W REFLEX MICROSCOPIC   ____________________________________________   PROCEDURES  Procedure(s) performed:   Procedures  None ____________________________________________   INITIAL IMPRESSION / ASSESSMENT AND PLAN / ED COURSE  Pertinent labs & imaging results that were available during my care of the patient were reviewed by me and considered in my medical decision making (see chart for details).  Patient presents to the emergency department with elevated blood pressure in the setting of headache that has since resolved and generally not feeling well.  Blood pressure on arrival is 153/94.  He has no history of hypertension and is on medications for this.  He does have a primary care physician.  No findings on my exam including in-depth neurological exam. Plan for labs to assess kidney function, H/H for possible  symptomatic anemia, and UA to assess for UTI or proteinuria. No evidence at this time to suggest HTN emergency.  Discussed outpatient blood pressure management and need for primary care physician follow-up.   06:13 PM Labs are unremarkable.  No evidence of hypertension emergency.  Patient to follow with his primary care physician.  At this time, I do not feel there is any life-threatening condition present. I have reviewed and discussed all results (EKG, imaging, lab, urine as appropriate), exam findings with patient. I have reviewed nursing notes and appropriate previous records.  I feel the patient is safe to be discharged home without further emergent workup. Discussed usual and customary return precautions. Patient and family (if present) verbalize understanding and are comfortable with this plan.  Patient will follow-up with their primary care provider. If they do not have a primary care provider, information for follow-up has been provided to them. All questions have been answered.  ____________________________________________  FINAL  CLINICAL IMPRESSION(S) / ED DIAGNOSES  Final diagnoses:  Essential hypertension  Fatigue, unspecified type     MEDICATIONS GIVEN DURING THIS VISIT:  None  NEW OUTPATIENT MEDICATIONS STARTED DURING THIS VISIT:  None   Note:  This document was prepared using Dragon voice recognition software and may include unintentional dictation errors.  Nanda Quinton, MD Emergency Medicine    Long, [facility], MD 11/14/16 [phone]

## 2016-11-16 ENCOUNTER — Ambulatory Visit (INDEPENDENT_AMBULATORY_CARE_PROVIDER_SITE_OTHER): Payer: Medicare Other | Admitting: Family Medicine

## 2016-11-16 ENCOUNTER — Encounter: Payer: Self-pay | Admitting: Family Medicine

## 2016-11-16 VITALS — BP 124/70 | HR 65 | Resp 16 | Ht 75.0 in | Wt 200.0 lb

## 2016-11-16 DIAGNOSIS — Z0000 Encounter for general adult medical examination without abnormal findings: Secondary | ICD-10-CM

## 2016-11-16 DIAGNOSIS — Z1211 Encounter for screening for malignant neoplasm of colon: Secondary | ICD-10-CM

## 2016-11-16 DIAGNOSIS — R7303 Prediabetes: Secondary | ICD-10-CM

## 2016-11-16 DIAGNOSIS — F17200 Nicotine dependence, unspecified, uncomplicated: Secondary | ICD-10-CM

## 2016-11-16 DIAGNOSIS — D126 Benign neoplasm of colon, unspecified: Secondary | ICD-10-CM

## 2016-11-16 DIAGNOSIS — Z09 Encounter for follow-up examination after completed treatment for conditions other than malignant neoplasm: Secondary | ICD-10-CM

## 2016-11-16 DIAGNOSIS — R195 Other fecal abnormalities: Secondary | ICD-10-CM

## 2016-11-16 LAB — POC HEMOCCULT BLD/STL (OFFICE/1-CARD/DIAGNOSTIC): Fecal Occult Blood, POC: POSITIVE — AB

## 2016-11-16 NOTE — Progress Notes (Signed)
   Matthew Adkins     MRN: [id number]      DOB: 27-Feb-1950   HPI: Patient is in for annual physical exam. Also here for f/u from ED seen this past weekend when he presented with an elevated blood pressure which subsequently corrected on its own, he has no dx of hypertension and only reports a lot of ongoing stress and sleep deprivation caring for his sister with severe dementia. Labs from ED visit are reviewed and are normal  Immunization is reviewed , and  updated if needed.    PE; BP 124/70   Pulse 65   Resp 16   Ht 6\' 3"  (1.905 m)   Wt 200 lb (90.7 kg)   SpO2 96%   BMI 25.00 kg/m   Pleasant male, alert and oriented x 3, in no cardio-pulmonary distress. Afebrile. HEENT No facial trauma or asymetry. Sinuses non tender. EOMI, pupils equally reactive to light. External ears normal, tympanic membranes clear. Oropharynx moist, no exudate. Neck: supple, no adenopathy,JVD or thyromegaly.No bruits.  Chest: Clear to ascultation bilaterally.No crackles or wheezes. Non tender to palpation  Breast: No asymetry,no masses. No nipple discharge or inversion. No axillary or supraclavicular adenopathy  Cardiovascular system; Heart sounds normal,  S1 and  S2 ,no S3.  No murmur, or thrill. Apical beat not displaced Peripheral pulses normal.  Abdomen: Soft, non tender, no organomegaly or masses. No bruits. Bowel sounds normal. No guarding, tenderness or rebound.  Rectal:  Normal sphincter tone. No hemorrhoids or  masses. guaiac negative stool.  Musculoskeletal exam: Full ROM of spine, hips , shoulders and knees. No deformity ,swelling or crepitus noted. No muscle wasting or atrophy.   Neurologic: Cranial nerves 2 to 12 intact. Power, tone ,sensation and reflexes normal throughout. No disturbance in gait. No tremor.  Skin: Intact, no ulceration, erythema , scaling or rash noted. Pigmentation normal throughout  Psych; Normal mood and affect. Judgement and  concentration normal   Assessment & Plan:  Annual physical exam Annual exam as documented.  Immunization and cancer screening needs are specifically addressed at this visit.   Tubular adenoma of colon Heme positive stool on exam refer to GI  Encounter for examination following treatment at hospital Normotensive at visit , and record review shows that his blood pressure is generally normal. Single elevated reading related to high intake of salted pork immediately preceeding the visit and stress  NICOTINE ADDICTION Patient is asked and  confirms current  Nicotine use.  Five to seven minutes of time is spent in counseling the patient of the need to quit smoking  Advice to quit is delivered clearly specifically in reducing the risk of developing heart disease, having a stroke, or of developing all types of cancer, especially lung and oral cancer. Improvement in breathing and exercise tolerance and quality of life is also discussed, as is the economic benefit.  Assessment of willingness to quit or to make an attempt to quit is made and documented  Assistance in quit attempt is made with several and varied options presented, based on patient's desire and need. These include  literature, local classes available, 1800 QUIT NOW number, OTC and prescription medication.  The GOAL to be NICOTINE FREE is re emphasized.  The patient has set a personal goal of either reduction or discontinuation and follow up is arranged between 6 an 16 weeks.

## 2016-11-16 NOTE — Patient Instructions (Addendum)
Wellness with nurse in January  MD f/u in 5 months  Blood pressure is good  You are refeerred to Dr Oneida Alar because you have hidden blood in the stool  Please work on quitting smoking for improved health and lower risk of early death  Schedule your appt with Dr Jeffie Pollock     Steps to Quit Smoking Smoking tobacco can be bad for your health. It can also affect almost every organ in your body. Smoking puts you and people around you at risk for many serious long-lasting (chronic) diseases. Quitting smoking is hard, but it is one of the best things that you can do for your health. It is never too late to quit. What are the benefits of quitting smoking? When you quit smoking, you lower your risk for getting serious diseases and conditions. They can include:  Lung cancer or lung disease.  Heart disease.  Stroke.  Heart attack.  Not being able to have children (infertility).  Weak bones (osteoporosis) and broken bones (fractures).  If you have coughing, wheezing, and shortness of breath, those symptoms may get better when you quit. You may also get sick less often. If you are pregnant, quitting smoking can help to lower your chances of having a baby of low birth weight. What can I do to help me quit smoking? Talk with your doctor about what can help you quit smoking. Some things you can do (strategies) include:  Quitting smoking totally, instead of slowly cutting back how much you smoke over a period of time.  Going to in-person counseling. You are more likely to quit if you go to many counseling sessions.  Using resources and support systems, such as: ? Database administrator with a Social worker. ? Phone quitlines. ? Careers information officer. ? Support groups or group counseling. ? Text messaging programs. ? Mobile phone apps or applications.  Taking medicines. Some of these medicines may have nicotine in them. If you are pregnant or breastfeeding, do not take any medicines to quit smoking  unless your doctor says it is okay. Talk with your doctor about counseling or other things that can help you.  Talk with your doctor about using more than one strategy at the same time, such as taking medicines while you are also going to in-person counseling. This can help make quitting easier. What things can I do to make it easier to quit? Quitting smoking might feel very hard at first, but there is a lot that you can do to make it easier. Take these steps:  Talk to your family and friends. Ask them to support and encourage you.  Call phone quitlines, reach out to support groups, or work with a Social worker.  Ask people who smoke to not smoke around you.  Avoid places that make you want (trigger) to smoke, such as: ? Bars. ? Parties. ? Smoke-break areas at work.  Spend time with people who do not smoke.  Lower the stress in your life. Stress can make you want to smoke. Try these things to help your stress: ? Getting regular exercise. ? Deep-breathing exercises. ? Yoga. ? Meditating. ? Doing a body scan. To do this, close your eyes, focus on one area of your body at a time from head to toe, and notice which parts of your body are tense. Try to relax the muscles in those areas.  Download or buy apps on your mobile phone or tablet that can help you stick to your quit plan. There are many free  apps, such as QuitGuide from the [employer] Office manager for Disease Control and Prevention). You can find more support from smokefree.gov and other websites.  This information is not intended to replace advice given to you by your health care provider. Make sure you discuss any questions you have with your health care provider. Document Released: 10/18/2008 Document Revised: 08/20/2015 Document Reviewed: 05/08/2014 Elsevier Interactive Patient Education  2018 [employer].

## 2016-11-17 ENCOUNTER — Encounter: Payer: Self-pay | Admitting: Nurse Practitioner

## 2016-11-18 ENCOUNTER — Encounter: Payer: Self-pay | Admitting: Family Medicine

## 2016-11-18 ENCOUNTER — Other Ambulatory Visit (HOSPITAL_COMMUNITY)
Admission: RE | Admit: 2016-11-18 | Discharge: 2016-11-18 | Disposition: A | Discharge status: Home / Self Care | Payer: Medicare Other | Source: Ambulatory Visit | Attending: Family Medicine | Admitting: Family Medicine

## 2016-11-18 DIAGNOSIS — R7303 Prediabetes: Secondary | ICD-10-CM | POA: Diagnosis present

## 2016-11-18 DIAGNOSIS — Z09 Encounter for follow-up examination after completed treatment for conditions other than malignant neoplasm: Secondary | ICD-10-CM | POA: Insufficient documentation

## 2016-11-18 DIAGNOSIS — D126 Benign neoplasm of colon, unspecified: Secondary | ICD-10-CM | POA: Insufficient documentation

## 2016-11-18 LAB — HEMOGLOBIN A1C
HEMOGLOBIN A1C: 6.1 % — AB (ref 4.8–5.6)
MEAN PLASMA GLUCOSE: 128.37 mg/dL

## 2016-11-18 NOTE — Assessment & Plan Note (Signed)
Heme positive stool on exam refer to GI

## 2016-11-18 NOTE — Assessment & Plan Note (Signed)
Annual exam as documented. . Immunization and cancer screening needs are specifically addressed at this visit.  

## 2016-11-18 NOTE — Assessment & Plan Note (Signed)

## 2016-11-18 NOTE — Assessment & Plan Note (Signed)
Normotensive at visit , and record review shows that his blood pressure is generally normal. Single elevated reading related to high intake of salted pork immediately preceeding the visit and stress

## 2017-01-04 ENCOUNTER — Telehealth: Payer: Self-pay | Admitting: *Deleted

## 2017-01-04 ENCOUNTER — Ambulatory Visit: Payer: Medicare Other | Admitting: Nurse Practitioner

## 2017-01-04 ENCOUNTER — Other Ambulatory Visit: Payer: Self-pay | Admitting: *Deleted

## 2017-01-04 ENCOUNTER — Encounter: Payer: Self-pay | Admitting: *Deleted

## 2017-01-04 ENCOUNTER — Encounter: Payer: Self-pay | Admitting: Nurse Practitioner

## 2017-01-04 DIAGNOSIS — R195 Other fecal abnormalities: Secondary | ICD-10-CM | POA: Diagnosis not present

## 2017-01-04 MED ORDER — PEG 3350-KCL-NA BICARB-NACL 420 G PO SOLR: 4000.0000 mL | Freq: Once | ORAL | 0 refills | Status: AC

## 2017-01-04 NOTE — Progress Notes (Signed)
cc'ed to pcp °

## 2017-01-04 NOTE — Patient Instructions (Signed)
1. We will schedule your procedure for you. 2. Return for follow-up in 3 months. 3. Call us if you have any questions or concerns.

## 2017-01-04 NOTE — Telephone Encounter (Signed)
Spoke with pt and is aware pre-op appt scheduled for 02/03/17 10:00am. Letter mailed to pt as well.

## 2017-01-04 NOTE — Assessment & Plan Note (Signed)
Patient presents from primary care with heme positive stool.  Previous years she has been heme negative at his physical.  He does have a history of multiple polyps that are tubular adenoma.  Last colonoscopy 2012 with a recommended 10-year repeat screening/surveillance.  Denies overt GI symptoms.  Does describe intermittent "indigestion but not really heartburn."  Denies NSAIDs and aspirin powders.  No obvious hematochezia or melena.  At this point given his heme positive stools we will proceed with colonoscopy and possible endoscopy.  Return for follow-up in 3 months.  Proceed with colonoscopy +/- EGD on propofol with Dr. Oneida Alar in the near future. The risks, benefits, and alternatives have been discussed in detail with the patient. They state understanding and desire to proceed.   The patient is not on any anticoagulants, anxiolytics, chronic pain medications, or antidepressants.  He does drink just under a pint of liquor a week.  He smokes occasional marijuana.  We will plan for propofol/MAC given regular alcohol to promote adequate sedation.

## 2017-01-04 NOTE — Progress Notes (Signed)
Primary Care Physician:  Fayrene Helper, MD Primary Gastroenterologist:  Dr. Oneida Alar  Chief Complaint  Patient presents with  . heme+ stool    HPI:   Matthew Adkins is a 66 y.o. male who presents on referral from PCP Dr. Moshe Cipro for heme positive stools on his recent physical exam.  Described as generally asymptomatic from a GI standpoint at his annual physical on 11/16/2016.  Hemoccult card that day was positive.  The year before it was noted to be negative.  CBC around that time found normal hemoglobin of 14.6.  No abdominal imaging in the last 2 years.  Colonoscopy completed 09/26/2010 for rectal bleeding which found two 3 mm sessile polyps in the transverse colon, nodular appearing sigmoid mucosa status post biopsy.  Scattered diverticula throughout the colon, small internal hemorrhoids.  The polyps were found to be tubular adenoma.  Sigmoid colon biopsies found to be polypoid chronic mucosa with lymphoid aggregates.  Recommended 10-year repeat exam.  Path report from 2009 colonoscopy unable to be found.  Today he states he's doing well overall. Denies abdominal pain, N/V, hematochezia, melena. Has GERD/"indigestion" symptoms about 2 times a week. Denies acute changes in bowel habits. Has about 5 bowel movements a week, not hard, no straining. Denies hemorrhoid symptoms (itching/irritation in the rectum.) Denies chest pain, dyspnea, dizziness, lightheadedness, syncope, near syncope. Denies any other upper or lower GI symptoms.  Denies NSAIDs and ASA powders.  Past Medical History:  Diagnosis Date  . Bladder cancer ([place]) 2000  . Depression   . Nicotine addiction   . Prostate cancer ([place]) 07/06/2014  . Seasonal allergies     Past Surgical History:  Procedure Laterality Date  . athroscopy of right knee  2002  . cancerous polyps removed from bladder  2000  . COLONOSCOPY  [facility]J 2009 pmhX: POLYPS   POLYP REMOVED BUT NOT RETRIEVED  . COLONOSCOPY  LS 2007 ARS   ? POLYPS, no path  available  . COLONOSCOPY  09/26/2010   sessile poylp(2) pan-colnoic diverticulosis,mild/internal hemorrhoids  . FOOT SURGERY  2004  . HERNIA REPAIR  1980's   left inguinal hernia  . LYMPHADENECTOMY Bilateral 09/03/2014   Procedure: PELVIC LYMPHADENECTOMY;  Surgeon: Raynelle Bring, MD;  Location: [facility];  Service: Urology;  Laterality: Bilateral;  . PROSTATE BIOPSY  07/06/14  . ROBOT ASSISTED LAPAROSCOPIC RADICAL PROSTATECTOMY N/A 09/03/2014   Procedure: ROBOTIC ASSISTED LAPAROSCOPIC RADICAL PROSTATECTOMY LEVEL 2;  Surgeon: Raynelle Bring, MD;  Location: [facility];  Service: Urology;  Laterality: N/A;    Current Outpatient Medications  Medication Sig Dispense Refill  . acetaminophen (TYLENOL) 500 MG tablet Take 1,000 mg by mouth every 6 (six) hours as needed for mild pain.    Marland Kitchen aspirin 81 MG tablet Take 81 mg by mouth daily.    Marland Kitchen FLUoxetine (PROZAC) 20 MG capsule TAKE 1 CAPSULE BY MOUTH DAILY. 90 capsule 1  . Multiple Vitamins-Minerals (ONE DAILY MENS 50+ MULTIVIT PO) Take 1 tablet by mouth daily.     No current facility-administered medications for this visit.     Allergies as of 01/04/2017 - Review Complete 01/04/2017  Allergen Reaction Noted  . Bee venom  09/06/2011  . Codeine Nausea And Vomiting 01/03/2009    Family History  Problem Relation Age of Onset  . Heart attack Father   . Diabetes Sister   . Hypertension Sister   . Hypertension Sister   . Aneurysm Sister        brain   . Arthritis  Unknown        family history   . Colon cancer Neg Hx   . Colon polyps Neg Hx     Social History   Socioeconomic History  . Marital status: Married    Spouse name: Not on file  . Number of children: 2  . Years of education: Not on file  . Highest education level: Not on file  Social Needs  . Financial resource strain: Not on file  . Food insecurity - worry: Not on file  . Food insecurity - inability: Not on file  . Transportation needs - medical: Not on file  . Transportation needs  - non-medical: Not on file  Occupational History  . Occupation: unemployed   Tobacco Use  . Smoking status: Current Every Day Smoker    Packs/day: 0.50    Years: 30.00    Pack years: 15.00    Types: Cigarettes  . Smokeless tobacco: Never Used  Substance and Sexual Activity  . Alcohol use: Yes    Comment: 3 pints/month  . Drug use: Yes    Types: Marijuana    Comment: occassionally  . Sexual activity: Not on file  Other Topics Concern  . Not on file  Social History Narrative  . Not on file    Review of Systems: Complete ROS negative except as per HPI.   Physical Exam: BP 132/80   Pulse 63   Temp (!) 97.5 F (36.4 C) (Oral)   Ht 6\' 3"  (1.905 m)   Wt 205 lb 3.2 oz (93.1 kg)   BMI 25.65 kg/m  General:   Alert and oriented. Pleasant and cooperative. Well-nourished and well-developed.  Eyes:  Without icterus, sclera clear and conjunctiva pink.  Ears:  Normal auditory acuity. Cardiovascular:  S1, S2 present without murmurs appreciated. Extremities without clubbing or edema. Respiratory:  Clear to auscultation bilaterally. No wheezes, rales, or rhonchi. No distress.  Gastrointestinal:  +BS, soft, non-tender and non-distended. No HSM noted. No guarding or rebound. No masses appreciated.  Rectal:  Deferred  Musculoskalatal:  Symmetrical without gross deformities. Neurologic:  Alert and oriented x4;  grossly normal neurologically. Psych:  Alert and cooperative. Normal mood and affect. Heme/Lymph/Immune: No excessive bruising noted.    01/04/2017 8:34 AM   Disclaimer: This note was dictated with voice recognition software. Similar sounding words can inadvertently be transcribed and may not be corrected upon review.

## 2017-01-25 ENCOUNTER — Ambulatory Visit: Payer: Medicare Other

## 2017-02-01 NOTE — Patient Instructions (Signed)
Matthew Adkins  02/01/2017     @PREFPERIOPPHARMACY @   Your procedure is scheduled on   02/09/2017 .  Report to [facility] at  645  A.M.  Call this number if you have problems the morning of surgery:  [phone]   Remember:  Do not eat food or drink liquids after midnight.  Take these medicines the morning of surgery with A SIP OF WATER  prozac.   Do not wear jewelry, make-up or nail polish.  Do not wear lotions, powders, or perfumes, or deodorant.  Do not shave 48 hours prior to surgery.  Men may shave face and neck.  Do not bring valuables to the hospital.  [facility] is not responsible for any belongings or valuables.  Contacts, dentures or bridgework may not be worn into surgery.  Leave your suitcase in the car.  After surgery it may be brought to your room.  For patients admitted to the hospital, discharge time will be determined by your treatment team.  Patients discharged the day of surgery will not be allowed to drive home.   Name and phone number of your driver:   family Special instructions:  Follow the diet and prep instructions given to you by Dr Nona Dell office.  Please read over the following fact sheets that you were given. Anesthesia Post-op Instructions and Care and Recovery After Surgery       Colonoscopy, Adult A colonoscopy is an exam to look at the large intestine. It is done to check for problems, such as:  Lumps (tumors).  Growths (polyps).  Swelling (inflammation).  Bleeding.  What happens before the procedure? Eating and drinking Follow instructions from your doctor about eating and drinking. These instructions may include:  A few days before the procedure - follow a low-fiber diet. ? Avoid nuts. ? Avoid seeds. ? Avoid dried fruit. ? Avoid raw fruits. ? Avoid vegetables.  1-3 days before the procedure - follow a clear liquid diet. Avoid liquids that have red or purple dye. Drink only clear liquids, such  as: ? Clear broth or bouillon. ? Black coffee or tea. ? Clear juice. ? Clear soft drinks or sports drinks. ? Gelatin dessert. ? Popsicles.  On the day of the procedure - do not eat or drink anything during the 2 hours before the procedure.  Bowel prep If you were prescribed an oral bowel prep:  Take it as told by your doctor. Starting the day before your procedure, you will need to drink a lot of liquid. The liquid will cause you to poop (have bowel movements) until your poop is almost clear or light green.  If your skin or butt gets irritated from diarrhea, you may: ? Wipe the area with wipes that have medicine in them, such as adult wet wipes with aloe and vitamin E. ? Put something on your skin that soothes the area, such as petroleum jelly.  If you throw up (vomit) while drinking the bowel prep, take a break for up to 60 minutes. Then begin the bowel prep again. If you keep throwing up and you cannot take the bowel prep without throwing up, call your doctor.  General instructions  Ask your doctor about changing or stopping your normal medicines. This is important if you take diabetes medicines or blood thinners.  Plan to have someone take you home from the hospital or clinic. What happens during the procedure?  An  IV tube may be put into one of your veins.  You will be given medicine to help you relax (sedative).  To reduce your risk of infection: ? Your doctors will wash their Adkins. ? Your anal area will be washed with soap.  You will be asked to lie on your side with your knees bent.  Your doctor will get a long, thin, flexible tube ready. The tube will have a camera and a light on the end.  The tube will be put into your anus.  The tube will be gently put into your large intestine.  Air will be delivered into your large intestine to keep it open. You may feel some pressure or cramping.  The camera will be used to take photos.  A small tissue sample may be  removed from your body to be looked at under a microscope (biopsy). If any possible problems are found, the tissue will be sent to a lab for testing.  If small growths are found, your doctor may remove them and have them checked for cancer.  The tube that was put into your anus will be slowly removed. The procedure may vary among doctors and hospitals. What happens after the procedure?  Your doctor will check on you often until the medicines you were given have worn off.  Do not drive for 24 hours after the procedure.  You may have a small amount of blood in your poop.  You may pass gas.  You may have mild cramps or bloating in your belly (abdomen).  It is up to you to get the results of your procedure. Ask your doctor, or the department performing the procedure, when your results will be ready. This information is not intended to replace advice given to you by your health care provider. Make sure you discuss any questions you have with your health care provider. Document Released: 01/24/2010 Document Revised: 10/23/2015 Document Reviewed: 03/05/2015 Elsevier Interactive Patient Education  2017 [employer]  Colonoscopy, Adult, Care After This sheet gives you information about how to care for yourself after your procedure. Your health care provider may also give you more specific instructions. If you have problems or questions, contact your health care provider. What can I expect after the procedure? After the procedure, it is common to have:  A small amount of blood in your stool for 24 hours after the procedure.  Some gas.  Mild abdominal cramping or bloating.  Follow these instructions at home: General instructions   For the first 24 hours after the procedure: ? Do not drive or use machinery. ? Do not sign important documents. ? Do not drink alcohol. ? Do your regular daily activities at a slower pace than normal. ? Eat soft, easy-to-digest foods. ? Rest  often.  Take over-the-counter or prescription medicines only as told by your health care provider.  It is up to you to get the results of your procedure. Ask your health care provider, or the department performing the procedure, when your results will be ready. Relieving cramping and bloating  Try walking around when you have cramps or feel bloated.  Apply heat to your abdomen as told by your health care provider. Use a heat source that your health care provider recommends, such as a moist heat pack or a heating pad. ? Place a towel between your skin and the heat source. ? Leave the heat on for 20-30 minutes. ? Remove the heat if your skin turns bright red. This is especially  important if you are unable to feel pain, heat, or cold. You may have a greater risk of getting burned. Eating and drinking  Drink enough fluid to keep your urine clear or pale yellow.  Resume your normal diet as instructed by your health care provider. Avoid heavy or fried foods that are hard to digest.  Avoid drinking alcohol for as long as instructed by your health care provider. Contact a health care provider if:  You have blood in your stool 2-3 days after the procedure. Get help right away if:  You have more than a small spotting of blood in your stool.  You pass large blood clots in your stool.  Your abdomen is swollen.  You have nausea or vomiting.  You have a fever.  You have increasing abdominal pain that is not relieved with medicine. This information is not intended to replace advice given to you by your health care provider. Make sure you discuss any questions you have with your health care provider. Document Released: 08/06/2003 Document Revised: 09/16/2015 Document Reviewed: 03/05/2015 Elsevier Interactive Patient Education  2018 [employer].  Esophagogastroduodenoscopy Esophagogastroduodenoscopy (EGD) is a procedure to examine the lining of the esophagus, stomach, and first part of the  small intestine (duodenum). This procedure is done to check for problems such as inflammation, bleeding, ulcers, or growths. During this procedure, a long, flexible, lighted tube with a camera attached (endoscope) is inserted down the throat. Tell a health care provider about:  Any allergies you have.  All medicines you are taking, including vitamins, herbs, eye drops, creams, and over-the-counter medicines.  Any problems you or family members have had with anesthetic medicines.  Any blood disorders you have.  Any surgeries you have had.  Any medical conditions you have.  Whether you are pregnant or may be pregnant. What are the risks? Generally, this is a safe procedure. However, problems may occur, including:  Infection.  Bleeding.  A tear (perforation) in the esophagus, stomach, or duodenum.  Trouble breathing.  Excessive sweating.  Spasms of the larynx.  A slowed heartbeat.  Low blood pressure.  What happens before the procedure?  Follow instructions from your health care provider about eating or drinking restrictions.  Ask your health care provider about: ? Changing or stopping your regular medicines. This is especially important if you are taking diabetes medicines or blood thinners. ? Taking medicines such as aspirin and ibuprofen. These medicines can thin your blood. Do not take these medicines before your procedure if your health care provider instructs you not to.  Plan to have someone take you home after the procedure.  If you wear dentures, be ready to remove them before the procedure. What happens during the procedure?  To reduce your risk of infection, your health care team will wash or sanitize their Adkins.  An IV tube will be put in a vein in your hand or arm. You will get medicines and fluids through this tube.  You will be given one or more of the following: ? A medicine to help you relax (sedative). ? A medicine to numb the area (local  anesthetic). This medicine may be sprayed into your throat. It will make you feel more comfortable and keep you from gagging or coughing during the procedure. ? A medicine for pain.  A mouth guard may be placed in your mouth to protect your teeth and to keep you from biting on the endoscope.  You will be asked to lie on your left side.  The endoscope will be lowered down your throat into your esophagus, stomach, and duodenum.  Air will be put into the endoscope. This will help your health care provider see better.  The lining of your esophagus, stomach, and duodenum will be examined.  Your health care provider may: ? Take a tissue sample so it can be looked at in a lab (biopsy). ? Remove growths. ? Remove objects (foreign bodies) that are stuck. ? Treat any bleeding with medicines or other devices that stop tissue from bleeding. ? Widen (dilate) or stretch narrowed areas of your esophagus and stomach.  The endoscope will be taken out. The procedure may vary among health care providers and hospitals. What happens after the procedure?  Your blood pressure, heart rate, breathing rate, and blood oxygen level will be monitored often until the medicines you were given have worn off.  Do not eat or drink anything until the numbing medicine has worn off and your gag reflex has returned. This information is not intended to replace advice given to you by your health care provider. Make sure you discuss any questions you have with your health care provider. Document Released: 04/24/2004 Document Revised: 05/30/2015 Document Reviewed: 11/15/2014 Elsevier Interactive Patient Education  2018 [employer]. Esophagogastroduodenoscopy, Care After Refer to this sheet in the next few weeks. These instructions provide you with information about caring for yourself after your procedure. Your health care provider may also give you more specific instructions. Your treatment has been planned according to  current medical practices, but problems sometimes occur. Call your health care provider if you have any problems or questions after your procedure. What can I expect after the procedure? After the procedure, it is common to have:  A sore throat.  Nausea.  Bloating.  Dizziness.  Fatigue.  Follow these instructions at home:  Do not eat or drink anything until the numbing medicine (local anesthetic) has worn off and your gag reflex has returned. You will know that the local anesthetic has worn off when you can swallow comfortably.  Do not drive for 24 hours if you received a medicine to help you relax (sedative).  If your health care provider took a tissue sample for testing during the procedure, make sure to get your test results. This is your responsibility. Ask your health care provider or the department performing the test when your results will be ready.  Keep all follow-up visits as told by your health care provider. This is important. Contact a health care provider if:  You cannot stop coughing.  You are not urinating.  You are urinating less than usual. Get help right away if:  You have trouble swallowing.  You cannot eat or drink.  You have throat or chest pain that gets worse.  You are dizzy or light-headed.  You faint.  You have nausea or vomiting.  You have chills.  You have a fever.  You have severe abdominal pain.  You have black, tarry, or bloody stools. This information is not intended to replace advice given to you by your health care provider. Make sure you discuss any questions you have with your health care provider. Document Released: 12/09/2011 Document Revised: 05/30/2015 Document Reviewed: 11/15/2014 Elsevier Interactive Patient Education  2018 [place] Anesthesia is a term that refers to techniques, procedures, and medicines that help a person stay safe and comfortable during a medical procedure. Monitored  anesthesia care, or sedation, is one type of anesthesia. Your anesthesia specialist may  recommend sedation if you will be having a procedure that does not require you to be unconscious, such as:  Cataract surgery.  A dental procedure.  A biopsy.  A colonoscopy.  During the procedure, you may receive a medicine to help you relax (sedative). There are three levels of sedation:  Mild sedation. At this level, you may feel awake and relaxed. You will be able to follow directions.  Moderate sedation. At this level, you will be sleepy. You may not remember the procedure.  Deep sedation. At this level, you will be asleep. You will not remember the procedure.  The more medicine you are given, the deeper your level of sedation will be. Depending on how you respond to the procedure, the anesthesia specialist may change your level of sedation or the type of anesthesia to fit your needs. An anesthesia specialist will monitor you closely during the procedure. Let your health care provider know about:  Any allergies you have.  All medicines you are taking, including vitamins, herbs, eye drops, creams, and over-the-counter medicines.  Any use of steroids (by mouth or as a cream).  Any problems you or family members have had with sedatives and anesthetic medicines.  Any blood disorders you have.  Any surgeries you have had.  Any medical conditions you have, such as sleep apnea.  Whether you are pregnant or may be pregnant.  Any use of cigarettes, alcohol, or street drugs. What are the risks? Generally, this is a safe procedure. However, problems may occur, including:  Getting too much medicine (oversedation).  Nausea.  Allergic reaction to medicines.  Trouble breathing. If this happens, a breathing tube may be used to help with breathing. It will be removed when you are awake and breathing on your own.  Heart trouble.  Lung trouble.  Before the procedure Staying hydrated Follow  instructions from your health care provider about hydration, which may include:  Up to 2 hours before the procedure - you may continue to drink clear liquids, such as water, clear fruit juice, black coffee, and plain tea.  Eating and drinking restrictions Follow instructions from your health care provider about eating and drinking, which may include:  8 hours before the procedure - stop eating heavy meals or foods such as meat, fried foods, or fatty foods.  6 hours before the procedure - stop eating light meals or foods, such as toast or cereal.  6 hours before the procedure - stop drinking milk or drinks that contain milk.  2 hours before the procedure - stop drinking clear liquids.  Medicines Ask your health care provider about:  Changing or stopping your regular medicines. This is especially important if you are taking diabetes medicines or blood thinners.  Taking medicines such as aspirin and ibuprofen. These medicines can thin your blood. Do not take these medicines before your procedure if your health care provider instructs you not to.  Tests and exams  You will have a physical exam.  You may have blood tests done to show: ? How well your kidneys and liver are working. ? How well your blood can clot.  General instructions  Plan to have someone take you home from the hospital or clinic.  If you will be going home right after the procedure, plan to have someone with you for 24 hours.  What happens during the procedure?  Your blood pressure, heart rate, breathing, level of pain and overall condition will be monitored.  An IV tube will be inserted into  one of your veins.  Your anesthesia specialist will give you medicines as needed to keep you comfortable during the procedure. This may mean changing the level of sedation.  The procedure will be performed. After the procedure  Your blood pressure, heart rate, breathing rate, and blood oxygen level will be monitored  until the medicines you were given have worn off.  Do not drive for 24 hours if you received a sedative.  You may: ? Feel sleepy, clumsy, or nauseous. ? Feel forgetful about what happened after the procedure. ? Have a sore throat if you had a breathing tube during the procedure. ? Vomit. This information is not intended to replace advice given to you by your health care provider. Make sure you discuss any questions you have with your health care provider. Document Released: 09/17/2004 Document Revised: 05/31/2015 Document Reviewed: 04/14/2015 Elsevier Interactive Patient Education  2018 [place], Care After These instructions provide you with information about caring for yourself after your procedure. Your health care provider may also give you more specific instructions. Your treatment has been planned according to current medical practices, but problems sometimes occur. Call your health care provider if you have any problems or questions after your procedure. What can I expect after the procedure? After your procedure, it is common to:  Feel sleepy for several hours.  Feel clumsy and have poor balance for several hours.  Feel forgetful about what happened after the procedure.  Have poor judgment for several hours.  Feel nauseous or vomit.  Have a sore throat if you had a breathing tube during the procedure.  Follow these instructions at home: For at least 24 hours after the procedure:   Do not: ? Participate in activities in which you could fall or become injured. ? Drive. ? Use heavy machinery. ? Drink alcohol. ? Take sleeping pills or medicines that cause drowsiness. ? Make important decisions or sign legal documents. ? Take care of children on your own.  Rest. Eating and drinking  Follow the diet that is recommended by your health care provider.  If you vomit, drink water, juice, or soup when you can drink without vomiting.  Make  sure you have little or no nausea before eating solid foods. General instructions  Have a responsible adult stay with you until you are awake and alert.  Take over-the-counter and prescription medicines only as told by your health care provider.  If you smoke, do not smoke without supervision.  Keep all follow-up visits as told by your health care provider. This is important. Contact a health care provider if:  You keep feeling nauseous or you keep vomiting.  You feel light-headed.  You develop a rash.  You have a fever. Get help right away if:  You have trouble breathing. This information is not intended to replace advice given to you by your health care provider. Make sure you discuss any questions you have with your health care provider. Document Released: 04/14/2015 Document Revised: 08/14/2015 Document Reviewed: 04/14/2015 Elsevier Interactive Patient Education  [employer].

## 2017-02-03 ENCOUNTER — Other Ambulatory Visit: Payer: Self-pay

## 2017-02-03 ENCOUNTER — Encounter (HOSPITAL_COMMUNITY)
Admission: RE | Admit: 2017-02-03 | Discharge: 2017-02-03 | Disposition: A | Discharge status: Home / Self Care | Payer: Medicare Other | Source: Ambulatory Visit | Attending: Gastroenterology | Admitting: Gastroenterology

## 2017-02-03 ENCOUNTER — Encounter (HOSPITAL_COMMUNITY): Payer: Self-pay

## 2017-02-03 DIAGNOSIS — Z01810 Encounter for preprocedural cardiovascular examination: Secondary | ICD-10-CM | POA: Insufficient documentation

## 2017-02-03 DIAGNOSIS — F17210 Nicotine dependence, cigarettes, uncomplicated: Secondary | ICD-10-CM | POA: Insufficient documentation

## 2017-02-03 DIAGNOSIS — R9431 Abnormal electrocardiogram [ECG] [EKG]: Secondary | ICD-10-CM | POA: Insufficient documentation

## 2017-02-03 DIAGNOSIS — I4439 Other atrioventricular block: Secondary | ICD-10-CM | POA: Diagnosis not present

## 2017-02-05 ENCOUNTER — Encounter (HOSPITAL_COMMUNITY): Payer: Self-pay | Admitting: *Deleted

## 2017-02-05 ENCOUNTER — Emergency Department (HOSPITAL_COMMUNITY)
Admission: EM | Admit: 2017-02-05 | Discharge: 2017-02-05 | Disposition: A | Discharge status: Home / Self Care | Payer: Medicare Other | Attending: Emergency Medicine | Admitting: Emergency Medicine

## 2017-02-05 ENCOUNTER — Other Ambulatory Visit: Payer: Self-pay

## 2017-02-05 DIAGNOSIS — Z8551 Personal history of malignant neoplasm of bladder: Secondary | ICD-10-CM | POA: Insufficient documentation

## 2017-02-05 DIAGNOSIS — R51 Headache: Secondary | ICD-10-CM | POA: Insufficient documentation

## 2017-02-05 DIAGNOSIS — D126 Benign neoplasm of colon, unspecified: Secondary | ICD-10-CM | POA: Diagnosis not present

## 2017-02-05 DIAGNOSIS — Z7982 Long term (current) use of aspirin: Secondary | ICD-10-CM | POA: Diagnosis not present

## 2017-02-05 DIAGNOSIS — R519 Headache, unspecified: Secondary | ICD-10-CM

## 2017-02-05 DIAGNOSIS — F17210 Nicotine dependence, cigarettes, uncomplicated: Secondary | ICD-10-CM | POA: Insufficient documentation

## 2017-02-05 DIAGNOSIS — Z8546 Personal history of malignant neoplasm of prostate: Secondary | ICD-10-CM | POA: Diagnosis not present

## 2017-02-05 DIAGNOSIS — Z79899 Other long term (current) drug therapy: Secondary | ICD-10-CM | POA: Insufficient documentation

## 2017-02-05 LAB — CBC WITH DIFFERENTIAL/PLATELET
Basophils Absolute: 0 10*3/uL (ref 0.0–0.1)
Basophils Relative: 0 %
Eosinophils Absolute: 0.1 10*3/uL (ref 0.0–0.7)
Eosinophils Relative: 2 %
HEMATOCRIT: 43.6 % (ref 39.0–52.0)
Hemoglobin: 14.3 g/dL (ref 13.0–17.0)
LYMPHS ABS: 1.6 10*3/uL (ref 0.7–4.0)
LYMPHS PCT: 30 %
MCH: 30.8 pg (ref 26.0–34.0)
MCHC: 32.8 g/dL (ref 30.0–36.0)
MCV: 93.8 fL (ref 78.0–100.0)
MONO ABS: 0.5 10*3/uL (ref 0.1–1.0)
Monocytes Relative: 9 %
NEUTROS ABS: 3.2 10*3/uL (ref 1.7–7.7)
Neutrophils Relative %: 59 %
Platelets: 190 10*3/uL (ref 150–400)
RBC: 4.65 MIL/uL (ref 4.22–5.81)
RDW: 13.4 % (ref 11.5–15.5)
WBC: 5.4 10*3/uL (ref 4.0–10.5)

## 2017-02-05 LAB — SEDIMENTATION RATE: Sed Rate: 8 mm/hr (ref 0–16)

## 2017-02-05 LAB — BASIC METABOLIC PANEL
ANION GAP: 8 (ref 5–15)
BUN: 14 mg/dL (ref 6–20)
CHLORIDE: 105 mmol/L (ref 101–111)
CO2: 23 mmol/L (ref 22–32)
Calcium: 8.6 mg/dL — ABNORMAL LOW (ref 8.9–10.3)
Creatinine, Ser: 0.89 mg/dL (ref 0.61–1.24)
GFR calc Af Amer: >60 mL/min (ref >60)
GFR calc non Af Amer: >60 mL/min (ref >60)
GLUCOSE: 89 mg/dL (ref 65–99)
POTASSIUM: 4.3 mmol/L (ref 3.5–5.1)
Sodium: 136 mmol/L (ref 135–145)

## 2017-02-05 MED ORDER — KETOROLAC TROMETHAMINE 30 MG/ML IJ SOLN
15.0000 mg | Freq: Once | INTRAMUSCULAR | Status: AC
  Administered 2017-02-05: 15 mg via INTRAVENOUS
  Filled 2017-02-05: qty 1

## 2017-02-05 MED ORDER — SODIUM CHLORIDE 0.9 % IV BOLUS (SEPSIS)
1000.0000 mL | Freq: Once | INTRAVENOUS | Status: AC
  Administered 2017-02-05: 1000 mL via INTRAVENOUS

## 2017-02-05 NOTE — ED Triage Notes (Signed)
Headache for 4 days, took tylenol with relief

## 2017-02-05 NOTE — ED Notes (Signed)
Pt reports intermittent HA, mainly to back of R head. Has not taken OTC meds today, but Tylenol improves them in the past.

## 2017-02-05 NOTE — Discharge Instructions (Signed)
As discussed, your evaluation today has been largely reassuring.  But, it is important that you monitor your condition carefully, and do not hesitate to return to the ED if you develop new, or concerning changes in your condition. ? ?Otherwise, please follow-up with your physician for appropriate ongoing care. ? ?

## 2017-02-05 NOTE — ED Provider Notes (Signed)
[facility] EMERGENCY DEPARTMENT Provider Note   CSN: [id number] Arrival date & time: 02/05/17  1439     History   Chief Complaint Chief Complaint  Patient presents with  . Headache    HPI Matthew Adkins is a 67 y.o. male.  HPI Patient presents with concern of headache. He notes that over the past few days, and perhaps longer, he has had episodic transient headache Headache originates in the right posterior, radiates anteriorly across the scalp No clear precipitating or alleviating factors, though Tylenol does seem to keep the headaches from occurring as frequently. No concurrent visual changes, nausea, weakness. Patient states he is generally well, denies other substantial illness. 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 Past Medical History:  Diagnosis Date  . Bladder cancer ([place]) 2000  . Depression   . Nicotine addiction   . Prostate cancer ([place]) 07/06/2014  . Seasonal allergies     Patient Active Problem List   Diagnosis Date Noted  . Heme + stool 01/04/2017  . Tubular adenoma of colon 11/18/2016  . Encounter for examination following treatment at hospital 11/18/2016  . Left shoulder pain 10/30/2015  . Prostate cancer ([place]) 07/25/2014  . Annual physical exam 06/07/2014  . Hemorrhoids, internal 09/30/2011  . Arthritis of knee, degenerative 09/30/2011  . Degenerative disc disease, lumbar 09/30/2011  . Pulmonary nodule 09/21/2011  . Depression 06/25/2010  . SKIN TAG 10/21/2009  . ALLERGIC RHINITIS 09/19/2009  . Dyslipidemia 08/17/2008  . Prediabetes 10/14/2007  . BLADDER CANCER 04/13/2007  . NICOTINE ADDICTION 04/13/2007    Past Surgical History:  Procedure Laterality Date  . athroscopy of right knee  2002  . cancerous polyps removed from bladder  2000  . COLONOSCOPY  MJ 2009 pmhX: POLYPS   POLYP REMOVED BUT NOT RETRIEVED  . COLONOSCOPY  LS  2007 ARS   ? POLYPS, no path available  . COLONOSCOPY  09/26/2010   sessile poylp(2) pan-colnoic diverticulosis,mild/internal hemorrhoids  . FOOT SURGERY Bilateral 2004  . HERNIA REPAIR  1980's   left inguinal hernia  . LYMPHADENECTOMY Bilateral 09/03/2014   Procedure: PELVIC LYMPHADENECTOMY;  Surgeon: Raynelle Bring, MD;  Location: [facility];  Service: Urology;  Laterality: Bilateral;  . PROSTATE BIOPSY  07/06/14  . ROBOT ASSISTED LAPAROSCOPIC RADICAL PROSTATECTOMY N/A 09/03/2014   Procedure: ROBOTIC ASSISTED LAPAROSCOPIC RADICAL PROSTATECTOMY LEVEL 2;  Surgeon: Raynelle Bring, MD;  Location: [facility];  Service: Urology;  Laterality: N/A;       Home Medications    Prior to Admission medications   Medication Sig Start Date End Date Taking? Authorizing Provider  acetaminophen (TYLENOL) 500 MG tablet Take 1,000 mg by mouth every 6 (six) hours as needed for mild pain.   Yes [provider]  aspirin 81 MG tablet Take 81 mg by mouth daily.   Yes [provider]  FLUoxetine (PROZAC) 20 MG capsule TAKE 1 CAPSULE BY MOUTH DAILY. 09/21/16  Yes Fayrene Helper, MD  Multiple Vitamins-Minerals (ONE DAILY MENS 50+ MULTIVIT PO) Take 1 tablet by mouth daily.   Yes [provider]    Family History Family History  Problem Relation Age of Onset  . Heart attack Father   . Diabetes Sister   . Hypertension Sister   . Hypertension Sister   . Aneurysm Sister        brain   . Arthritis Unknown  family history   . Colon cancer Neg Hx   . Colon polyps Neg Hx     Social History Social History   Tobacco Use  . Smoking status: Current Every Day Smoker    Packs/day: 0.50    Years: 30.00    Pack years: 15.00    Types: Cigarettes  . Smokeless tobacco: Never Used  Substance Use Topics  . Alcohol use: Yes    Comment: 3 pints/month  . Drug use: Yes    Types: Marijuana    Comment: occassionally     Allergies   Bee venom and Codeine   Review of Systems Review of  Systems  Constitutional:       Per HPI, otherwise negative  HENT:       Per HPI, otherwise negative  Respiratory:       Per HPI, otherwise negative  Cardiovascular:       Per HPI, otherwise negative  Gastrointestinal: Negative for vomiting.  Endocrine:       Negative aside from HPI  Genitourinary:       Neg aside from HPI   Musculoskeletal:       Per HPI, otherwise negative  Skin: Negative.   Neurological: Positive for headaches. Negative for syncope.     Physical Exam Updated Vital Signs BP 131/85 (BP Location: Right Arm)   Pulse 61   Temp 98.1 F (36.7 C) (Oral)   Resp 15   Ht 6\' 3"  (1.905 m)   Wt 95.3 kg (210 lb)   SpO2 98%   BMI 26.25 kg/m   Physical Exam  Constitutional: He is oriented to person, place, and time. He appears well-developed. No distress.  HENT:  Head: Normocephalic and atraumatic.  Eyes: Conjunctivae and EOM are normal.  Cardiovascular: Normal rate and regular rhythm.  Pulmonary/Chest: Effort normal. No stridor. No respiratory distress.  Abdominal: He exhibits no distension.  Musculoskeletal: He exhibits no edema.  Neurological: He is alert and oriented to person, place, and time. He displays no atrophy and no tremor. He exhibits normal muscle tone. He displays no seizure activity.  Skin: Skin is warm and dry.  Psychiatric: He has a normal mood and affect.  Nursing note and vitals reviewed.    ED Treatments / Results  Labs (all labs ordered are listed, but only abnormal results are displayed) Labs Reviewed  BASIC METABOLIC PANEL - Abnormal; Notable for the following components:      Result Value   Calcium 8.6 (*)    All other components within normal limits  CBC WITH DIFFERENTIAL/PLATELET  SEDIMENTATION RATE    EKG  EKG Interpretation None       Radiology No results found.  Procedures Procedures (including critical care time)  Medications Ordered in ED Medications  sodium chloride 0.9 % bolus 1,000 mL (1,000 mLs  Intravenous New Bag/Given 02/05/17 1708)  ketorolac (TORADOL) 30 MG/ML injection 15 mg (15 mg Intravenous Given 02/05/17 1708)     Initial Impression / Assessment and Plan / ED Course  I have reviewed the triage vital signs and the nursing notes.  Pertinent labs & imaging results that were available during my care of the patient were reviewed by me and considered in my medical decision making (see chart for details).     6:30 PM On repeat exam patient is in no distress. This well-appearing male presents with episodic headache, described as brief, radiating from posterior to anterior, on the right side of his head. Some suspicion for trigeminal neuralgia, though  the transient nature is inconsistent with this. No evidence for vasculitis, intracranial pathology, with a reassuring physical exam, absence of focal neurologic deficits. Patient remained unremarkable here, hemodynamically stable. Patient discharged in stable condition with outpatient follow-up.  Final Clinical Impressions(s) / ED Diagnoses  Bad headache   Carmin Muskrat, MD 02/05/17 [phone]

## 2017-02-09 ENCOUNTER — Ambulatory Visit (HOSPITAL_COMMUNITY)
Admission: RE | Admit: 2017-02-09 | Discharge: 2017-02-09 | Disposition: A | Discharge status: Home / Self Care | Payer: Medicare Other | Source: Ambulatory Visit | Attending: Gastroenterology | Admitting: Gastroenterology

## 2017-02-09 ENCOUNTER — Encounter (HOSPITAL_COMMUNITY)
Admission: RE | Disposition: A | Discharge status: Home / Self Care | Payer: Self-pay | Source: Ambulatory Visit | Attending: Gastroenterology

## 2017-02-09 ENCOUNTER — Other Ambulatory Visit: Payer: Self-pay

## 2017-02-09 ENCOUNTER — Encounter (HOSPITAL_COMMUNITY): Payer: Self-pay | Admitting: *Deleted

## 2017-02-09 ENCOUNTER — Ambulatory Visit (HOSPITAL_COMMUNITY): Payer: Medicare Other | Admitting: Anesthesiology

## 2017-02-09 DIAGNOSIS — D120 Benign neoplasm of cecum: Secondary | ICD-10-CM | POA: Insufficient documentation

## 2017-02-09 DIAGNOSIS — K644 Residual hemorrhoidal skin tags: Secondary | ICD-10-CM | POA: Diagnosis not present

## 2017-02-09 DIAGNOSIS — K3189 Other diseases of stomach and duodenum: Secondary | ICD-10-CM | POA: Insufficient documentation

## 2017-02-09 DIAGNOSIS — K648 Other hemorrhoids: Secondary | ICD-10-CM | POA: Insufficient documentation

## 2017-02-09 DIAGNOSIS — K635 Polyp of colon: Secondary | ICD-10-CM | POA: Diagnosis not present

## 2017-02-09 DIAGNOSIS — Q438 Other specified congenital malformations of intestine: Secondary | ICD-10-CM | POA: Diagnosis not present

## 2017-02-09 DIAGNOSIS — Z8551 Personal history of malignant neoplasm of bladder: Secondary | ICD-10-CM | POA: Diagnosis not present

## 2017-02-09 DIAGNOSIS — K222 Esophageal obstruction: Secondary | ICD-10-CM | POA: Insufficient documentation

## 2017-02-09 DIAGNOSIS — Z885 Allergy status to narcotic agent status: Secondary | ICD-10-CM | POA: Insufficient documentation

## 2017-02-09 DIAGNOSIS — F329 Major depressive disorder, single episode, unspecified: Secondary | ICD-10-CM | POA: Diagnosis not present

## 2017-02-09 DIAGNOSIS — K2970 Gastritis, unspecified, without bleeding: Secondary | ICD-10-CM | POA: Insufficient documentation

## 2017-02-09 DIAGNOSIS — D123 Benign neoplasm of transverse colon: Secondary | ICD-10-CM | POA: Diagnosis not present

## 2017-02-09 DIAGNOSIS — K2980 Duodenitis without bleeding: Secondary | ICD-10-CM | POA: Insufficient documentation

## 2017-02-09 DIAGNOSIS — Z8546 Personal history of malignant neoplasm of prostate: Secondary | ICD-10-CM | POA: Diagnosis not present

## 2017-02-09 DIAGNOSIS — K621 Rectal polyp: Secondary | ICD-10-CM | POA: Diagnosis not present

## 2017-02-09 DIAGNOSIS — R195 Other fecal abnormalities: Secondary | ICD-10-CM

## 2017-02-09 DIAGNOSIS — K2990 Gastroduodenitis, unspecified, without bleeding: Secondary | ICD-10-CM | POA: Diagnosis not present

## 2017-02-09 DIAGNOSIS — Z8249 Family history of ischemic heart disease and other diseases of the circulatory system: Secondary | ICD-10-CM | POA: Insufficient documentation

## 2017-02-09 DIAGNOSIS — Z86010 Personal history of colonic polyps: Secondary | ICD-10-CM | POA: Insufficient documentation

## 2017-02-09 DIAGNOSIS — K2950 Unspecified chronic gastritis without bleeding: Secondary | ICD-10-CM | POA: Diagnosis not present

## 2017-02-09 DIAGNOSIS — Z91030 Bee allergy status: Secondary | ICD-10-CM | POA: Diagnosis not present

## 2017-02-09 DIAGNOSIS — F17210 Nicotine dependence, cigarettes, uncomplicated: Secondary | ICD-10-CM | POA: Insufficient documentation

## 2017-02-09 HISTORY — PX: COLONOSCOPY WITH PROPOFOL: SHX5780

## 2017-02-09 HISTORY — PX: ESOPHAGOGASTRODUODENOSCOPY (EGD) WITH PROPOFOL: SHX5813

## 2017-02-09 HISTORY — PX: POLYPECTOMY: SHX5525

## 2017-02-09 SURGERY — COLONOSCOPY WITH PROPOFOL
Anesthesia: Monitor Anesthesia Care

## 2017-02-09 MED ORDER — LIDOCAINE VISCOUS 2 % MT SOLN
5.0000 mL | Freq: Once | OROMUCOSAL | Status: AC
  Administered 2017-02-09: 4 mL via OROMUCOSAL

## 2017-02-09 MED ORDER — OMEPRAZOLE 20 MG PO CPDR: DELAYED_RELEASE_CAPSULE | ORAL | 3 refills | Status: DC

## 2017-02-09 MED ORDER — PROPOFOL 10 MG/ML IV BOLUS
INTRAVENOUS | Status: DC | PRN
  Administered 2017-02-09 (×2): 10 mg via INTRAVENOUS
  Administered 2017-02-09: 20 mg via INTRAVENOUS

## 2017-02-09 MED ORDER — FENTANYL CITRATE (PF) 100 MCG/2ML IJ SOLN
25.0000 ug | Freq: Once | INTRAMUSCULAR | Status: AC
  Administered 2017-02-09: 25 ug via INTRAVENOUS

## 2017-02-09 MED ORDER — MIDAZOLAM HCL 2 MG/2ML IJ SOLN
INTRAMUSCULAR | Status: AC
Start: 2017-02-09 — End: 2017-02-09
  Filled 2017-02-09: qty 2

## 2017-02-09 MED ORDER — STERILE WATER FOR IRRIGATION IR SOLN
Status: DC | PRN
  Administered 2017-02-09: 200 mL

## 2017-02-09 MED ORDER — EPHEDRINE SULFATE 50 MG/ML IJ SOLN
INTRAMUSCULAR | Status: DC | PRN
  Administered 2017-02-09: 10 mg via INTRAVENOUS
  Administered 2017-02-09: 5 mg via INTRAVENOUS

## 2017-02-09 MED ORDER — LIDOCAINE VISCOUS 2 % MT SOLN
OROMUCOSAL | Status: AC
  Filled 2017-02-09: qty 15

## 2017-02-09 MED ORDER — MIDAZOLAM HCL 2 MG/2ML IJ SOLN
1.0000 mg | INTRAMUSCULAR | Status: AC
  Administered 2017-02-09: 2 mg via INTRAVENOUS

## 2017-02-09 MED ORDER — PROPOFOL 500 MG/50ML IV EMUL
INTRAVENOUS | Status: DC | PRN
  Administered 2017-02-09: 150 ug/kg/min via INTRAVENOUS
  Administered 2017-02-09 (×2): via INTRAVENOUS

## 2017-02-09 MED ORDER — LACTATED RINGERS IV SOLN
INTRAVENOUS | Status: DC
  Administered 2017-02-09: 09:00:00 via INTRAVENOUS
  Administered 2017-02-09: 1000 mL via INTRAVENOUS

## 2017-02-09 MED ORDER — FENTANYL CITRATE (PF) 100 MCG/2ML IJ SOLN
INTRAMUSCULAR | Status: AC
  Filled 2017-02-09: qty 2

## 2017-02-09 NOTE — Discharge Instructions (Signed)
The blood detected in your stool was due TO GASTRITIS, POLYPS, AND INTERNAL HEMORRHOIDS. You had 9 polyps removed and diverticulosis in your left and right colon. You have LARGE internal hemorrhoids. You have MODERATE gastritis/DUODENITIS DUE TO ASPIRIN USE, a slight narrowing at the base of your esophagus due to acid reflux, AND A SMALL HIATAL HERNIA.  I biopsied your stomach.    DRINK WATER TO KEEP YOUR URINE LIGHT YELLOW.  FOLLOW A HIGH FIBER/LOW FAT DIET. AVOID ITEMS THAT CAUSE BLOATING. SEE INFO BELOW.  AVOID REFLUX TRIGGERS. SEE INFO BELOW.  TO TREAT GASTRITIS/DUODENITIS, START OMEPRAZOLE.  TAKE 30 MINUTES PRIOR TO YOUR FIRST MEAL.  YOUR BIOPSY RESULTS WILL BE AVAILABLE IN MY CHART AFTER FEB 9 AND MY OFFICE WILL CONTACT YOU IN 10-14 DAYS WITH YOUR RESULTS.   FOLLOW UP IN 6 MOS.   Next colonoscopy in 3 years.     ENDOSCOPY Care After Read the instructions outlined below and refer to this sheet in the next week. These discharge instructions provide you with general information on caring for yourself after you leave the hospital. While your treatment has been planned according to the most current medical practices available, unavoidable complications occasionally occur. If you have any problems or questions after discharge, call DR. Tianna Baus, [phone].  ACTIVITY  You may resume your regular activity, but move at a slower pace for the next 24 hours.   Take frequent rest periods for the next 24 hours.   Walking will help get rid of the air and reduce the bloated feeling in your belly (abdomen).   No driving for 24 hours (because of the medicine (anesthesia) used during the test).   You may shower.   Do not sign any important legal documents or operate any machinery for 24 hours (because of the anesthesia used during the test).    NUTRITION  Drink plenty of fluids.   You may resume your normal diet as instructed by your doctor.   Begin with a light meal and progress to  your normal diet. Heavy or fried foods are harder to digest and may make you feel sick to your stomach (nauseated).   Avoid alcoholic beverages for 24 hours or as instructed.    MEDICATIONS  You may resume your normal medications.   WHAT YOU CAN EXPECT TODAY  Some feelings of bloating in the abdomen.   Passage of more gas than usual.   Spotting of blood in your stool or on the toilet paper  .  IF YOU HAD POLYPS REMOVED DURING THE ENDOSCOPY:  Eat a soft diet IF YOU HAVE NAUSEA, BLOATING, ABDOMINAL PAIN, OR VOMITING.    FINDING OUT THE RESULTS OF YOUR TEST Not all test results are available during your visit. DR. Oneida Alar WILL CALL YOU WITHIN 14 DAYS OF YOUR PROCEDUE WITH YOUR RESULTS. Do not assume everything is normal if you have not heard from DR. Rosevelt Luu, CALL HER OFFICE AT [phone].  SEEK IMMEDIATE MEDICAL ATTENTION AND CALL THE OFFICE: [phone] IF:  You have more than a spotting of blood in your stool.   Your belly is swollen (abdominal distention).   You are nauseated or vomiting.   You have a temperature over 101F.   You have abdominal pain or discomfort that is severe or gets worse throughout the day.   Gastritis/DUODENITIS  Gastritis is an inflammation (the body's way of reacting to injury and/or infection) of the stomach. DUODENITIS is an inflammation (the body's way of reacting to injury and/or infection) of  the FIRST PART OF THE SMALL INTESTINES. It is often caused by bacterial (germ) infections. It can also be caused BY ASPIRIN, BC/GOODY POWDER'S, (IBUPROFEN) MOTRIN, OR ALEVE (NAPROXEN), chemicals (including alcohol), SPICY FOODS, and medications. This illness may be associated with generalized malaise (feeling tired, not well), UPPER ABDOMINAL STOMACH cramps, and fever. One common bacterial cause of gastritis is an organism known as H. Pylori. This can be treated with antibiotics.   High-Fiber Diet A high-fiber diet changes your normal diet to include  more whole grains, legumes, fruits, and vegetables. Changes in the diet involve replacing refined carbohydrates with unrefined foods. The calorie level of the diet is essentially unchanged. The Dietary Reference Intake (recommended amount) for adult males is 38 grams per day. For adult females, it is 25 grams per day. Pregnant and lactating women should consume 28 grams of fiber per day. Fiber is the intact part of a plant that is not broken down during digestion. Functional fiber is fiber that has been isolated from the plant to provide a beneficial effect in the body. PURPOSE  Increase stool bulk.   Ease and regulate bowel movements.   Lower cholesterol.   REDUCE RISK OF COLON CANCER  INDICATIONS THAT YOU NEED MORE FIBER  Constipation and hemorrhoids.   Uncomplicated diverticulosis (intestine condition) and irritable bowel syndrome.   Weight management.   As a protective measure against hardening of the arteries (atherosclerosis), diabetes, and cancer.   GUIDELINES FOR INCREASING FIBER IN THE DIET  Start adding fiber to the diet slowly. A gradual increase of about 5 more grams (2 slices of whole-wheat bread, 2 servings of most fruits or vegetables, or 1 bowl of high-fiber cereal) per day is best. Too rapid an increase in fiber may result in constipation, flatulence, and bloating.   Drink enough water and fluids to keep your urine clear or pale yellow. Water, juice, or caffeine-free drinks are recommended. Not drinking enough fluid may cause constipation.   Eat a variety of high-fiber foods rather than one type of fiber.   Try to increase your intake of fiber through using high-fiber foods rather than fiber pills or supplements that contain small amounts of fiber.   The goal is to change the types of food eaten. Do not supplement your present diet with high-fiber foods, but replace foods in your present diet.   INCLUDE A VARIETY OF FIBER SOURCES  Replace refined and processed  grains with whole grains, canned fruits with fresh fruits, and incorporate other fiber sources. White rice, white breads, and most bakery goods contain little or no fiber.   Brown whole-grain rice, buckwheat oats, and many fruits and vegetables are all good sources of fiber. These include: broccoli, Brussels sprouts, cabbage, cauliflower, beets, sweet potatoes, white potatoes (skin on), carrots, tomatoes, eggplant, squash, berries, fresh fruits, and dried fruits.   Cereals appear to be the richest source of fiber. Cereal fiber is found in whole grains and bran. Bran is the fiber-rich outer coat of cereal grain, which is largely removed in refining. In whole-grain cereals, the bran remains. In breakfast cereals, the largest amount of fiber is found in those with "bran" in their names. The fiber content is sometimes indicated on the label.   You may need to include additional fruits and vegetables each day.   In baking, for 1 cup white flour, you may use the following substitutions:   1 cup whole-wheat flour minus 2 tablespoons.   1/2 cup white flour plus 1/2 cup whole-wheat flour.  Lifestyle and home remedies TO CONTROL HEARTBURN/REFLUX You may eliminate or reduce the frequency of heartburn by making the following lifestyle changes:  Control your weight. Being overweight is a major risk factor for heartburn and GERD. Excess pounds put pressure on your abdomen, pushing up your stomach and causing acid to back up into your esophagus.  Eat smaller meals. 4 TO 6 MEALS A DAY. This reduces pressure on the lower esophageal sphincter, helping to prevent the valve from opening and acid from washing back into your esophagus.  Loosen your belt. Clothes that fit tightly around your waist put pressure on your abdomen and the lower esophageal sphincter.   Eliminate heartburn triggers. Everyone has specific triggers. Common triggers such as fatty or fried foods, spicy food, tomato sauce, carbonated  beverages, alcohol, chocolate, mint, garlic, onion, caffeine and nicotine may make heartburn worse.   Avoid stooping or bending. Tying your shoes is OK. Bending over for longer periods to weed your garden isn't, especially soon after eating.   Don't lie down after a meal. Wait at least three to four hours after eating before going to bed, and don't lie down right after eating.   Raise the head of your bed. An elevation of about six to nine inches puts gravity to work for you. You can do this by placing wooden or cement blocks under the feet of your bed at the head end. If it's not possible to elevate your bed, you can insert a wedge between your mattress and box spring to elevate your body from the waist up. Wedges are available at drugstores and medical supply stores. Raising your head only by using pillows is not a good alternative.   Alternative medicine Several home remedies exist for treating GERD, but they provide only temporary relief. They include drinking baking soda (sodium bicarbonate) added to water or drinking other fluids such as baking soda mixed with cream of tartar and water. Although these liquids create temporary relief by neutralizing, washing away or buffering acids, eventually they aggravate the situation by adding gas and fluid to your stomach, increasing pressure and causing more acid reflux. Further, adding more sodium to your diet may increase your blood pressure and add stress to your heart, and excessive bicarbonate ingestion can alter the acid-base balance in your body.    Polyps, Colon  A polyp is extra tissue that grows inside your body. Colon polyps grow in the large intestine. The large intestine, also called the colon, is part of your digestive system. It is a long, hollow tube at the end of your digestive tract where your body makes and stores stool. Most polyps are not dangerous. They are benign. This means they are not cancerous. But over time, some types of polyps  can turn into cancer. Polyps that are smaller than a pea are usually not harmful. But larger polyps could someday become or may already be cancerous. To be safe, doctors remove all polyps and test them.    PREVENTION There is not one sure way to prevent polyps. You might be able to lower your risk of getting them if you:  Eat more fruits and vegetables and less fatty food.   Do not smoke.   Avoid alcohol.   Exercise every day.   Lose weight if you are overweight.   Eating more calcium and folate can also lower your risk of getting polyps. Some foods that are rich in calcium are milk, cheese, and broccoli. Some foods that are rich in  folate are chickpeas, kidney beans, and spinach.

## 2017-02-09 NOTE — Anesthesia Postprocedure Evaluation (Signed)
Anesthesia Post Note  Patient: Matthew Adkins  Procedure(s) Performed: COLONOSCOPY WITH PROPOFOL (N/A ) ESOPHAGOGASTRODUODENOSCOPY (EGD) WITH PROPOFOL (N/A ) POLYPECTOMY  Patient location during evaluation: PACU Anesthesia Type: MAC Level of consciousness: awake and alert and oriented Pain management: pain level controlled Vital Signs Assessment: post-procedure vital signs reviewed and stable Respiratory status: spontaneous breathing Cardiovascular status: blood pressure returned to baseline Postop Assessment: no apparent nausea or vomiting Anesthetic complications: no     Last Vitals:  Vitals:   02/09/17 0815 02/09/17 0820  BP: 126/70 113/65  Pulse:    Resp: 16 16  Temp:    SpO2: 99% 99%    Last Pain:  Vitals:   02/09/17 0708  TempSrc: Oral                 Latrell Potempa

## 2017-02-09 NOTE — H&P (Signed)
Primary Care Physician:  Fayrene Helper, MD Primary Gastroenterologist:  Dr. Oneida Alar  Pre-Procedure History & Physical: HPI:  Matthew Adkins is a 67 y.o. male here for HEME POS STOOLS. no NSAIDS.  Past Medical History:  Diagnosis Date  . Bladder cancer ([place]) 2000  . Depression   . Nicotine addiction   . Prostate cancer ([place]) 07/06/2014  . Seasonal allergies     Past Surgical History:  Procedure Laterality Date  . athroscopy of right knee  2002  . cancerous polyps removed from bladder  2000  . COLONOSCOPY  MJ 2009 pmhX: POLYPS   POLYP REMOVED BUT NOT RETRIEVED  . COLONOSCOPY  LS 2007 ARS   ? POLYPS, no path available  . COLONOSCOPY  09/26/2010   sessile poylp(2) pan-colonic diverticulosis,mild/internal hemorrhoids  . FOOT SURGERY Bilateral 2004  . HERNIA REPAIR  1980's   left inguinal hernia  . LYMPHADENECTOMY Bilateral 09/03/2014   Procedure: PELVIC LYMPHADENECTOMY;  Surgeon: Raynelle Bring, MD;  Location: [facility];  Service: Urology;  Laterality: Bilateral;  . PROSTATE BIOPSY  07/06/14  . ROBOT ASSISTED LAPAROSCOPIC RADICAL PROSTATECTOMY N/A 09/03/2014   Procedure: ROBOTIC ASSISTED LAPAROSCOPIC RADICAL PROSTATECTOMY LEVEL 2;  Surgeon: Raynelle Bring, MD;  Location: [facility];  Service: Urology;  Laterality: N/A;    Prior to Admission medications   Medication Sig Start Date End Date Taking? Authorizing Provider  acetaminophen (TYLENOL) 500 MG tablet Take 1,000 mg by mouth every 6 (six) hours as needed for mild pain.   Yes [provider]  aspirin 81 MG tablet Take 81 mg by mouth daily.   Yes [provider]  FLUoxetine (PROZAC) 20 MG capsule TAKE 1 CAPSULE BY MOUTH DAILY. 09/21/16  Yes Fayrene Helper, MD  Multiple Vitamins-Minerals (ONE DAILY MENS 50+ MULTIVIT PO) Take 1 tablet by mouth daily.   Yes [provider]    Allergies as of 01/04/2017 - Review Complete 01/04/2017  Allergen Reaction Noted  . Bee venom  09/06/2011  . Codeine Nausea And  Vomiting 01/03/2009    Family History  Problem Relation Age of Onset  . Heart attack Father   . Diabetes Sister   . Hypertension Sister   . Hypertension Sister   . Aneurysm Sister        brain   . Arthritis Unknown        family history   . Colon cancer Neg Hx   . Colon polyps Neg Hx     Social History   Socioeconomic History  . Marital status: Married    Spouse name: Not on file  . Number of children: 2  . Years of education: Not on file  . Highest education level: Not on file  Social Needs  . Financial resource strain: Not on file  . Food insecurity - worry: Not on file  . Food insecurity - inability: Not on file  . Transportation needs - medical: Not on file  . Transportation needs - non-medical: Not on file  Occupational History  . Occupation: unemployed   Tobacco Use  . Smoking status: Current Every Day Smoker    Packs/day: 0.50    Years: 30.00    Pack years: 15.00    Types: Cigarettes  . Smokeless tobacco: Never Used  Substance and Sexual Activity  . Alcohol use: Yes    Comment: 3 pints/month  . Drug use: Yes    Types: Marijuana    Comment: occassionally  . Sexual activity: Yes    Birth control/protection: None  Other Topics Concern  . Not on file  Social History Narrative  . Not on file    Review of Systems: See HPI, otherwise negative ROS   Physical Exam: BP 122/73   Pulse 60   Temp 98.2 F (36.8 C) (Oral)   Resp 18   Ht 6\' 3"  (1.905 m)   Wt 205 lb (93 kg)   SpO2 95%   BMI 25.62 kg/m  General:   Alert,  pleasant and cooperative in NAD Head:  Normocephalic and atraumatic. Neck:  Supple; Lungs:  Clear throughout to auscultation.    Heart:  Regular rate and rhythm. Abdomen:  Soft, nontender and nondistended. Normal bowel sounds, without guarding, and without rebound.   Neurologic:  Alert and  oriented x4;  grossly normal neurologically.  Impression/Plan:     HEME POS STOOLS  PLAN:  1.TCS/POSSIBLE EGD TODAY DISCUSSED PROCEDURE,  BENEFITS, & RISKS: < 1% chance of medication reaction, bleeding, perforation, or rupture of spleen/liver.

## 2017-02-09 NOTE — Op Note (Signed)
[facility] Patient Name: Matthew Adkins Procedure Date: 02/09/2017 9:12 AM MRN: [id number] Date of Birth: 22-Oct-1950 Attending MD: Barney Drain MD, MD CSN: [id number] Age: 67 Admit Type: Outpatient Procedure:                Upper GI endoscopy WITH COLD FORCEPS BIOPSY Indications:              Heme positive stool Providers:                Barney Drain MD, MD, Rosina Lowenstein, RN, Aram Candela Referring MD:             Norwood Levo. Simpson MD, MD Medicines:                Propofol per Anesthesia Complications:            No immediate complications. Estimated Blood Loss:     Estimated blood loss was minimal. Procedure:                Pre-Anesthesia Assessment:                           - Prior to the procedure, a History and Physical                            was performed, and patient medications and                            allergies were reviewed. The patient's tolerance of                            previous anesthesia was also reviewed. The risks                            and benefits of the procedure and the sedation                            options and risks were discussed with the patient.                            All questions were answered, and informed consent                            was obtained. Prior Anticoagulants: The patient has                            taken aspirin, last dose was 4 days prior to                            procedure. ASA Grade Assessment: II - A patient                            with mild systemic disease. After reviewing the                            risks and benefits, the patient was deemed in  satisfactory condition to undergo the procedure.                            After obtaining informed consent, the endoscope was                            passed under direct vision. Throughout the                            procedure, the patient's blood pressure, pulse, and                            oxygen  saturations were monitored continuously. The                            [id number]) was introduced through the mouth,                            and advanced to the second part of duodenum. The                            upper GI endoscopy was accomplished without                            difficulty. The patient tolerated the procedure                            well. Scope In: 9:14:57 AM Scope Out: 9:20:48 AM Total Procedure Duration: 0 hours 5 minutes 51 seconds  Findings:      A low-grade of narrowing Schatzki ring (acquired) was found at the       gastroesophageal junction.      Patchy mild inflammation characterized by congestion (edema) and       erythema was found on the greater curvature of the stomach and in the       gastric antrum. Biopsies(3/2) were taken with a cold forceps for       Helicobacter pylori testing.      Patchy moderate inflammation characterized by congestion (edema),       erosions and erythema was found in the duodenal bulb and in the second       portion of the duodenum. Impression:               - Low-grade of narrowing Schatzki ring.                           - HEME POSITIVE STOOLS DUE TO Gastritis/EROSIVE                            Duodenitis/POLYPS. Moderate Sedation:      Per Anesthesia Care Recommendation:           - Await pathology results.                           - Continue present medications.                           -  Use Prilosec (omeprazole) 20 mg PO daily                            indefinitely.                           - High fiber diet and low fat diet.                           - Return to my office in 6 months.                           - Patient has a contact number available for                            emergencies. The signs and symptoms of potential                            delayed complications were discussed with the                            patient. Return to normal activities tomorrow.                             Written discharge instructions were provided to the                            patient. Procedure Code(s):        --- Professional ---                           [phone], Esophagogastroduodenoscopy, flexible,                            transoral; with biopsy, single or multiple Diagnosis Code(s):        --- Professional ---                           K22.2, Esophageal obstruction                           K29.70, Gastritis, unspecified, without bleeding                           K29.80, Duodenitis without bleeding                           R19.5, Other fecal abnormalities CPT copyright 2016 American Medical Association. All rights reserved. The codes documented in this report are preliminary and upon coder review may  be revised to meet current compliance requirements. Barney Drain, MD Barney Drain MD, MD 02/09/2017 9:36:20 AM This report has been signed electronically. Number of Addenda: 0

## 2017-02-09 NOTE — Op Note (Signed)
[facility] Patient Name: Matthew Adkins Procedure Date: 02/09/2017 8:04 AM MRN: [id number] Date of Birth: Oct 02, 1950 Attending MD: Barney Drain MD, MD CSN: [id number] Age: 67 Admit Type: Outpatient Procedure:                Colonoscopy WITH COLD FORCEPS/SNARE & SNARE CAUTERY                            POLYPECTOMY Indications:              Heme positive stool, Personal history of colonic                            polyps Providers:                Barney Drain MD, MD, Rosina Lowenstein, RN, Aram Candela Referring MD:             Norwood Levo. Simpson MD, MD Medicines:                Propofol per Anesthesia Complications:            No immediate complications. Estimated Blood Loss:     Estimated blood loss was minimal. Procedure:                Pre-Anesthesia Assessment:                           - Prior to the procedure, a History and Physical                            was performed, and patient medications and                            allergies were reviewed. The patient's tolerance of                            previous anesthesia was also reviewed. The risks                            and benefits of the procedure and the sedation                            options and risks were discussed with the patient.                            All questions were answered, and informed consent                            was obtained. Prior Anticoagulants: The patient has                            taken aspirin, last dose was 4 days prior to                            procedure. ASA Grade Assessment: II - A patient  with mild systemic disease. After reviewing the                            risks and benefits, the patient was deemed in                            satisfactory condition to undergo the procedure.                            After obtaining informed consent, the colonoscope                            was passed under direct vision. Throughout the                    procedure, the patient's blood pressure, pulse, and                            oxygen saturations were monitored continuously. The                            [id number] ([id number]) scope was introduced through                            the anus and advanced to the 5 cm into the ileum.                            The colonoscopy was technically difficult and                            complex due to a tortuous colon. Successful                            completion of the procedure was aided by                            straightening and shortening the scope to obtain                            bowel loop reduction and COLOWRAP. The patient                            tolerated the procedure well. The quality of the                            bowel preparation was good. The terminal ileum,                            ileocecal valve, appendiceal orifice, and rectum                            were photographed. Scope In: 8:34:55 AM Scope Out: 9:09:38 AM Scope Withdrawal Time: 0 hours 31 minutes 11 seconds  Total Procedure Duration: 0 hours 34 minutes 43 seconds  Findings:      The terminal ileum appeared normal.      Two sessile polyps were found in the hepatic flexure and cecum. The       polyps were 4 to 8 mm(HF) in size. These polyps were removed with a hot       snare. Resection and retrieval were complete.      Three sessile polyps were found in the transverse colon and hepatic       flexure(2). The polyps were 4 to 5 mm in size. These polyps were removed       with a cold snare. Resection and retrieval were complete.      Four sessile polyps were found in the rectum and descending colon(2).       The polyps were 2 to 4 mm in size. These polyps were removed with a cold       biopsy forceps. Resection and retrieval were complete.      External and internal hemorrhoids were found during retroflexion. The       hemorrhoids were large.      Multiple small and large-mouthed  diverticula were found in the       recto-sigmoid colon, sigmoid colon and ascending colon. Impression:               - The examined portion of the ileum was normal.                           - Two 4 to 8 mm polyps at the hepatic flexure and                            in the cecum, removed with a hot snare. Resected                            and retrieved.                           - Three 4 to 5 mm polyps in the transverse colon                            and at the hepatic flexure, removed with a cold                            snare. Resected and retrieved.                           - Four 2 to 4 mm polyps in the rectum and in the                            descending colon, removed with a cold biopsy                            forceps. Resected and retrieved.                           - External and internal hemorrhoids.                           -  HEME POSITIVE STOOLS DUE TO                            POLYPS/GASTRITIS/DUODENITIS Moderate Sedation:      Per Anesthesia Care Recommendation:           - Repeat colonoscopy 1-3 YEARS for surveillance.                           - High fiber diet.                           - Continue present medications.                           - Await pathology results.                           - Patient has a contact number available for                            emergencies. The signs and symptoms of potential                            delayed complications were discussed with the                            patient. Return to normal activities tomorrow.                            Written discharge instructions were provided to the                            patient. Procedure Code(s):        --- Professional ---                           [id number], Colonoscopy, flexible; with removal of                            tumor(s), polyp(s), or other lesion(s) by snare                            technique                           45380, 90, Colonoscopy,  flexible; with biopsy,                            single or multiple Diagnosis Code(s):        --- Professional ---                           K64.8, Other hemorrhoids                           D12.0, Benign neoplasm of cecum  D12.3, Benign neoplasm of transverse colon (hepatic                            flexure or splenic flexure)                           K62.1, Rectal polyp                           D12.4, Benign neoplasm of descending colon                           R19.5, Other fecal abnormalities                           Z86.010, Personal history of colonic polyps CPT copyright 2016 American Medical Association. All rights reserved. The codes documented in this report are preliminary and upon coder review may  be revised to meet current compliance requirements. Barney Drain, MD Barney Drain MD, MD 02/09/2017 9:31:27 AM This report has been signed electronically. Number of Addenda: 0

## 2017-02-09 NOTE — Transfer of Care (Signed)
Immediate Anesthesia Transfer of Care Note  Patient: Matthew Adkins  Procedure(s) Performed: COLONOSCOPY WITH PROPOFOL (N/A ) ESOPHAGOGASTRODUODENOSCOPY (EGD) WITH PROPOFOL (N/A ) POLYPECTOMY  Patient Location: PACU  Anesthesia Type:MAC  Level of Consciousness: awake and alert   Airway & Oxygen Therapy: Patient Spontanous Breathing and Patient connected to nasal cannula oxygen  Post-op Assessment: Report given to RN  Post vital signs: Reviewed and stable  Last Vitals:  Vitals:   02/09/17 0815 02/09/17 0820  BP: 126/70 113/65  Pulse:    Resp: 16 16  Temp:    SpO2: 99% 99%    Last Pain:  Vitals:   02/09/17 0708  TempSrc: Oral      Patients Stated Pain Goal: 8 (15/52/08 0223)  Complications: No apparent anesthesia complications

## 2017-02-09 NOTE — Anesthesia Preprocedure Evaluation (Signed)
Anesthesia Evaluation  Patient identified by MRN, date of birth, ID band Patient awake    Reviewed: Allergy & Precautions, Patient's Chart, lab work & pertinent test results  History of Anesthesia Complications Negative for: history of anesthetic complications  Airway Mallampati: II  TM Distance: >3 FB Neck ROM: Full    Dental  (+) Teeth Intact, Dental Advisory Given   Pulmonary Current Smoker,    Pulmonary exam normal        Cardiovascular negative cardio ROS   Rhythm:Regular Rate:Normal     Neuro/Psych PSYCHIATRIC DISORDERS Depression negative neurological ROS     GI/Hepatic Neg liver ROS, GI bleed    Endo/Other  negative endocrine ROS  Renal/GU negative Renal ROSProstate and bladder  cancer      Musculoskeletal   Abdominal   Peds  Hematology   Anesthesia Other Findings   Reproductive/Obstetrics                             Anesthesia Physical Anesthesia Plan  ASA: III  Anesthesia Plan: MAC   Post-op Pain Management:    Induction: Intravenous  PONV Risk Score and Plan:   Airway Management Planned: Simple Face Mask  Additional Equipment:   Intra-op Plan:   Post-operative Plan:   Informed Consent: I have reviewed the patients History and Physical, chart, labs and discussed the procedure including the risks, benefits and alternatives for the proposed anesthesia with the patient or authorized representative who has indicated his/her understanding and acceptance.     Plan Discussed with:   Anesthesia Plan Comments:         Anesthesia Quick Evaluation

## 2017-02-10 ENCOUNTER — Encounter (HOSPITAL_COMMUNITY): Payer: Self-pay | Admitting: Gastroenterology

## 2017-02-11 NOTE — Progress Notes (Signed)
LMOM to call.

## 2017-02-11 NOTE — Progress Notes (Signed)
Pt is aware and will pick up the Omeprazole at the pharmacy.

## 2017-02-16 NOTE — Progress Notes (Signed)
PATIENT SCHEDULED AND ON RECALL  °

## 2017-02-25 ENCOUNTER — Other Ambulatory Visit: Payer: Self-pay

## 2017-02-25 MED ORDER — FLUOXETINE HCL 20 MG PO CAPS: 20.0000 mg | ORAL_CAPSULE | Freq: Every day | ORAL | 1 refills | Status: DC

## 2017-03-15 ENCOUNTER — Ambulatory Visit (INDEPENDENT_AMBULATORY_CARE_PROVIDER_SITE_OTHER): Payer: Medicare Other

## 2017-03-15 VITALS — BP 130/70 | HR 60 | Temp 98.4°F | Resp 16 | Ht 75.0 in | Wt 207.8 lb

## 2017-03-15 DIAGNOSIS — Z0000 Encounter for general adult medical examination without abnormal findings: Secondary | ICD-10-CM | POA: Diagnosis not present

## 2017-03-15 NOTE — Progress Notes (Signed)
Subjective:   Matthew Adkins is a 67 y.o. male who presents for Medicare Annual/Subsequent preventive examination.  Review of Systems:         Objective:    Vitals: BP 130/70 (BP Location: Left Arm, Patient Position: Sitting, Cuff Size: Normal)   Pulse 60   Temp 98.4 F (36.9 C) (Temporal)   Resp 16   Ht 6\' 3"  (1.905 m)   Wt 207 lb 12 oz (94.2 kg)   SpO2 96%   BMI 25.97 kg/m   Body mass index is 25.97 kg/m.  Advanced Directives 03/15/2017 02/09/2017 02/05/2017 02/03/2017 11/14/2016 01/01/2016 09/03/2014  Does Patient Have a Medical Advance Directive? Yes No No No Yes No No  Type of Advance Directive Healthcare Power of [place] in Chart? No - copy requested - - - - - -  Would patient like information on creating a medical advance directive? - No - Patient declined No - Patient declined No - Patient declined - Yes (ED - Information included in AVS) No - patient declined information    Tobacco Social History   Tobacco Use  Smoking Status Current Every Day Smoker  . Packs/day: 0.50  . Years: 30.00  . Pack years: 15.00  . Types: Cigarettes  Smokeless Tobacco Never Used     Ready to quit: No Counseling given: Yes   Clinical Intake:  Pre-visit preparation completed: Yes  Pain : No/denies pain Pain Score: 0-No pain     Diabetes: No  How often do you need to have someone help you when you read instructions, pamphlets, or other written materials from your doctor or pharmacy?: 1 - Never  Interpreter Needed?: No     Past Medical History:  Diagnosis Date  . Bladder cancer ([place]) 2000  . Depression   . Nicotine addiction   . Prostate cancer ([place]) 07/06/2014  . Seasonal allergies    Past Surgical History:  Procedure Laterality Date  . athroscopy of right knee  2002  . cancerous polyps removed from bladder  2000  . COLONOSCOPY  MJ 2009 pmhX: POLYPS   POLYP REMOVED BUT NOT RETRIEVED    . COLONOSCOPY  LS 2007 ARS   ? POLYPS, no path available  . COLONOSCOPY  09/26/2010   sessile poylp(2) pan-colonic diverticulosis,mild/internal hemorrhoids  . COLONOSCOPY WITH PROPOFOL N/A 02/09/2017   Procedure: COLONOSCOPY WITH PROPOFOL;  Surgeon: Danie Binder, MD;  [facility];  Service: Endoscopy;  Laterality: N/A;  8:30am  . ESOPHAGOGASTRODUODENOSCOPY (EGD) WITH PROPOFOL N/A 02/09/2017   Procedure: ESOPHAGOGASTRODUODENOSCOPY (EGD) WITH PROPOFOL;  Surgeon: Danie Binder, MD;  Location: [facility];  Service: Endoscopy;  Laterality: N/A;  . FOOT SURGERY Bilateral 2004  . HERNIA REPAIR  1980's   left inguinal hernia  . LYMPHADENECTOMY Bilateral 09/03/2014   Procedure: PELVIC LYMPHADENECTOMY;  Surgeon: Raynelle Bring, MD;  Location: [facility];  Service: Urology;  Laterality: Bilateral;  . POLYPECTOMY  02/09/2017   Procedure: POLYPECTOMY;  Surgeon: Danie Binder, MD;  Location: [facility];  Service: Endoscopy;;  cecal polyp, hepatic flexure polyps x3, transverse colon polyp, descending colon polyps x2, rectal polyps x2  . PROSTATE BIOPSY  07/06/14  . ROBOT ASSISTED LAPAROSCOPIC RADICAL PROSTATECTOMY N/A 09/03/2014   Procedure: ROBOTIC ASSISTED LAPAROSCOPIC RADICAL PROSTATECTOMY LEVEL 2;  Surgeon: Raynelle Bring, MD;  Location: [facility];  Service: Urology;  Laterality: N/A;   Family History  Problem  Relation Age of Onset  . Heart attack Father   . Diabetes Sister   . Hypertension Sister   . Hypertension Sister   . Aneurysm Sister        brain   . Arthritis Unknown        family history   . Colon cancer Neg Hx   . Colon polyps Neg Hx    Social History   Socioeconomic History  . Marital status: Married    Spouse name: None  . Number of children: 2  . Years of education: None  . Highest education level: None  Social Needs  . Financial resource strain: Somewhat hard  . Food insecurity - worry: Never true  . Food insecurity - inability: Never true  . Transportation needs  - medical: No  . Transportation needs - non-medical: No  Occupational History  . Occupation: unemployed   Tobacco Use  . Smoking status: Current Every Day Smoker    Packs/day: 0.50    Years: 30.00    Pack years: 15.00    Types: Cigarettes  . Smokeless tobacco: Never Used  Substance and Sexual Activity  . Alcohol use: Yes    Comment: 3 pints/month  . Drug use: Yes    Types: Marijuana    Comment: occassionally  . Sexual activity: Yes    Birth control/protection: None  Other Topics Concern  . None  Social History Narrative  . None    Outpatient Encounter Medications as of 03/15/2017  Medication Sig  . acetaminophen (TYLENOL) 500 MG tablet Take 1,000 mg by mouth every 6 (six) hours as needed for mild pain.  Marland Kitchen aspirin 81 MG tablet Take 81 mg by mouth daily.  Marland Kitchen FLUoxetine (PROZAC) 20 MG capsule Take 1 capsule (20 mg total) by mouth daily.  . Multiple Vitamins-Minerals (ONE DAILY MENS 50+ MULTIVIT PO) Take 1 tablet by mouth daily.  Marland Kitchen omeprazole (PRILOSEC) 20 MG capsule 1 PO 30 MINS PRIOR TO BREAKFAST.   No facility-administered encounter medications on file as of 03/15/2017.     Activities of Daily Living In your present state of health, do you have any difficulty performing the following activities: 03/15/2017 02/03/2017  Hearing? N N  Vision? N N  Difficulty concentrating or making decisions? N N  Walking or climbing stairs? N N  Dressing or bathing? N N  Doing errands, shopping? N N  Preparing Food and eating ? N -  Using the Toilet? N -  In the past six months, have you accidently leaked urine? Y -  Do you have problems with loss of bowel control? N -  Managing your Medications? N -  Managing your Finances? N -  Housekeeping or managing your Housekeeping? N -  Some recent data might be hidden    Patient Care Team: Fayrene Helper, MD as PCP - General Danie Binder, MD (Gastroenterology)   Assessment:   This is a routine wellness examination for  Matthew Adkins.  Exercise Activities and Dietary recommendations Current Exercise Habits: Home exercise routine, Type of exercise: walking, Time (Minutes): 20, Frequency (Times/Week): 2, Weekly Exercise (Minutes/Week): 40, Intensity: Mild  Goals    None      Fall Risk Fall Risk  03/15/2017 05/06/2016 01/01/2016 10/30/2015 07/31/2014  Falls in the past year? No No No No No   Is the patient's home free of loose throw rugs in walkways, pet beds, electrical cords, etc?   yes      Grab bars in the bathroom? yes  Handrails on the stairs?   yes      Adequate lighting?   yes   Depression Screen PHQ 2/9 Scores 03/15/2017 05/06/2016 05/06/2016 01/01/2016  PHQ - 2 Score 0 0 0 1  PHQ- 9 Score - - - -    Cognitive Function     6CIT Screen 03/15/2017 01/01/2016  What Year? 0 points 0 points  What month? 0 points 0 points  What time? 0 points 0 points  Count back from 20 0 points 0 points  Months in reverse 0 points 0 points  Repeat phrase 0 points 0 points  Total Score 0 0    Immunization History  Administered Date(s) Administered  . Influenza Split 09/20/2010, 10/06/2011  . Influenza Whole 10/02/2008, 09/19/2009  . Influenza,inj,Quad PF,6+ Mos 09/19/2014, 10/23/2015, 10/07/2016  . Pneumococcal Conjugate-13 05/30/2014  . Pneumococcal Polysaccharide-23 10/23/2015  . Td 10/21/2009  . Zoster 06/25/2010    Qualifies for Shingles Vaccine? Yes-   Screening Tests Health Maintenance  Topic Date Due  . TETANUS/TDAP  10/22/2019  . COLONOSCOPY  02/10/2027  . INFLUENZA VACCINE  Completed  . Hepatitis C Screening  Completed  . PNA vac Low Risk Adult  Completed   Cancer Screenings: Lung: Low Dose CT Chest recommended if Age 59-80 years, 30 pack-year currently smoking OR have quit w/in 15years. Patient does not qualify. Colorectal: Due 2020-2022      Plan:     I have personally reviewed and noted the following in the patient's chart:   . Medical and social history . Use of alcohol,  tobacco or illicit drugs  . Current medications and supplements . Functional ability and status . Nutritional status . Physical activity . Advanced directives . List of other physicians . Hospitalizations, surgeries, and ER visits in previous 12 months . Vitals . Screenings to include cognitive, depression, and falls . Referrals and appointments  In addition, I have reviewed and discussed with patient certain preventive protocols, quality metrics, and best practice recommendations. A written personalized care plan for preventive services as well as general preventive health recommendations were provided to patient.     Merceda Elks, LPN  [id number]

## 2017-03-15 NOTE — Patient Instructions (Signed)
Mr. Matthew Adkins , Thank you for taking time to come for your Medicare Wellness Visit. I appreciate your ongoing commitment to your health goals. Please review the following plan we discussed and let me know if I can assist you in the future.   Screening recommendations/referrals: Colonoscopy: Due 2020-2022 Recommended yearly ophthalmology/optometry visit for glaucoma screening and checkup Recommended yearly dental visit for hygiene and checkup  Vaccinations: Influenza vaccine: Fall 2019 Pneumococcal vaccine: 2022 Tdap vaccine: 2021 Shingles vaccine: Discuss updating    Advanced directives: Discussed today in office  Conditions/risks identified: None  Next appointment: 04/26/2017  Preventive Care 69 Years and Older, Male Preventive care refers to lifestyle choices and visits with your health care provider that can promote health and wellness. What does preventive care include?  A yearly physical exam. This is also called an annual well check.  Dental exams once or twice a year.  Routine eye exams. Ask your health care provider how often you should have your eyes checked.  Personal lifestyle choices, including:  Daily care of your teeth and gums.  Regular physical activity.  Eating a healthy diet.  Avoiding tobacco and drug use.  Limiting alcohol use.  Practicing safe sex.  Taking low doses of aspirin every day.  Taking vitamin and mineral supplements as recommended by your health care provider. What happens during an annual well check? The services and screenings done by your health care provider during your annual well check will depend on your age, overall health, lifestyle risk factors, and family history of disease. Counseling  Your health care provider may ask you questions about your:  Alcohol use.  Tobacco use.  Drug use.  Emotional well-being.  Home and relationship well-being.  Sexual activity.  Eating habits.  History of falls.  Memory and  ability to understand (cognition).  Work and work Statistician. Screening  You may have the following tests or measurements:  Height, weight, and BMI.  Blood pressure.  Lipid and cholesterol levels. These may be checked every 5 years, or more frequently if you are over 23 years old.  Skin check.  Lung cancer screening. You may have this screening every year starting at age 12 if you have a 30-pack-year history of smoking and currently smoke or have quit within the past 15 years.  Fecal occult blood test (FOBT) of the stool. You may have this test every year starting at age 42.  Flexible sigmoidoscopy or colonoscopy. You may have a sigmoidoscopy every 5 years or a colonoscopy every 10 years starting at age 37.  Prostate cancer screening. Recommendations will vary depending on your family history and other risks.  Hepatitis C blood test.  Hepatitis B blood test.  Sexually transmitted disease (STD) testing.  Diabetes screening. This is done by checking your blood sugar (glucose) after you have not eaten for a while (fasting). You may have this done every 1-3 years.  Abdominal aortic aneurysm (AAA) screening. You may need this if you are a current or former smoker.  Osteoporosis. You may be screened starting at age 69 if you are at high risk. Talk with your health care provider about your test results, treatment options, and if necessary, the need for more tests. Vaccines  Your health care provider may recommend certain vaccines, such as:  Influenza vaccine. This is recommended every year.  Tetanus, diphtheria, and acellular pertussis (Tdap, Td) vaccine. You may need a Td booster every 10 years.  Zoster vaccine. You may need this after age 69.  Pneumococcal  13-valent conjugate (PCV13) vaccine. One dose is recommended after age 21.  Pneumococcal polysaccharide (PPSV23) vaccine. One dose is recommended after age 21. Talk to your health care provider about which screenings and  vaccines you need and how often you need them. This information is not intended to replace advice given to you by your health care provider. Make sure you discuss any questions you have with your health care provider. Document Released: 01/18/2015 Document Revised: 09/11/2015 Document Reviewed: 10/23/2014 Elsevier Interactive Patient Education  2017 [place] Prevention in the Home Falls can cause injuries. They can happen to people of all ages. There are many things you can do to make your home safe and to help prevent falls. What can I do on the outside of my home?  Regularly fix the edges of walkways and driveways and fix any cracks.  Remove anything that might make you trip as you walk through a door, such as a raised step or threshold.  Trim any bushes or trees on the path to your home.  Use bright outdoor lighting.  Clear any walking paths of anything that might make someone trip, such as rocks or tools.  Regularly check to see if handrails are loose or broken. Make sure that both sides of any steps have handrails.  Any raised decks and porches should have guardrails on the edges.  Have any leaves, snow, or ice cleared regularly.  Use sand or salt on walking paths during winter.  Clean up any spills in your garage right away. This includes oil or grease spills. What can I do in the bathroom?  Use night lights.  Install grab bars by the toilet and in the tub and shower. Do not use towel bars as grab bars.  Use non-skid mats or decals in the tub or shower.  If you need to sit down in the shower, use a plastic, non-slip stool.  Keep the floor dry. Clean up any water that spills on the floor as soon as it happens.  Remove soap buildup in the tub or shower regularly.  Attach bath mats securely with double-sided non-slip rug tape.  Do not have throw rugs and other things on the floor that can make you trip. What can I do in the bedroom?  Use night  lights.  Make sure that you have a light by your bed that is easy to reach.  Do not use any sheets or blankets that are too big for your bed. They should not hang down onto the floor.  Have a firm chair that has side arms. You can use this for support while you get dressed.  Do not have throw rugs and other things on the floor that can make you trip. What can I do in the kitchen?  Clean up any spills right away.  Avoid walking on wet floors.  Keep items that you use a lot in easy-to-reach places.  If you need to reach something above you, use a strong step stool that has a grab bar.  Keep electrical cords out of the way.  Do not use floor polish or wax that makes floors slippery. If you must use wax, use non-skid floor wax.  Do not have throw rugs and other things on the floor that can make you trip. What can I do with my stairs?  Do not leave any items on the stairs.  Make sure that there are handrails on both sides of the stairs and use them. Fix  handrails that are broken or loose. Make sure that handrails are as long as the stairways.  Check any carpeting to make sure that it is firmly attached to the stairs. Fix any carpet that is loose or worn.  Avoid having throw rugs at the top or bottom of the stairs. If you do have throw rugs, attach them to the floor with carpet tape.  Make sure that you have a light switch at the top of the stairs and the bottom of the stairs. If you do not have them, ask someone to add them for you. What else can I do to help prevent falls?  Wear shoes that:  Do not have high heels.  Have rubber bottoms.  Are comfortable and fit you well.  Are closed at the toe. Do not wear sandals.  If you use a stepladder:  Make sure that it is fully opened. Do not climb a closed stepladder.  Make sure that both sides of the stepladder are locked into place.  Ask someone to hold it for you, if possible.  Clearly mark and make sure that you can  see:  Any grab bars or handrails.  First and last steps.  Where the edge of each step is.  Use tools that help you move around (mobility aids) if they are needed. These include:  Canes.  Walkers.  Scooters.  Crutches.  Turn on the lights when you go into a dark area. Replace any light bulbs as soon as they burn out.  Set up your furniture so you have a clear path. Avoid moving your furniture around.  If any of your floors are uneven, fix them.  If there are any pets around you, be aware of where they are.  Review your medicines with your doctor. Some medicines can make you feel dizzy. This can increase your chance of falling. Ask your doctor what other things that you can do to help prevent falls. This information is not intended to replace advice given to you by your health care provider. Make sure you discuss any questions you have with your health care provider. Document Released: 10/18/2008 Document Revised: 05/30/2015 Document Reviewed: 01/26/2014 Elsevier Interactive Patient Education  2017 [place] for Adults  A healthy lifestyle and preventive care can promote health and wellness. Preventive health guidelines for adults include the following key practices.  . A routine yearly physical is a good way to check with your health care provider about your health and preventive screening. It is a chance to share any concerns and updates on your health and to receive a thorough exam.  . Visit your dentist for a routine exam and preventive care every 6 months. Brush your teeth twice a day and floss once a day. Good oral hygiene prevents tooth decay and gum disease.  . The frequency of eye exams is based on your age, health, family medical history, use  of contact lenses, and other factors. Follow your health care provider's ecommendations for frequency of eye exams.  . Eat a healthy diet. Foods like vegetables, fruits, whole grains, low-fat dairy  products, and lean protein foods contain the nutrients you need without too many calories. Decrease your intake of foods high in solid fats, added sugars, and salt. Eat the right amount of calories for you. Get information about a proper diet from your health care provider, if necessary.  . Regular physical exercise is one of the most important things you can do for your health.  Most adults should get at least 150 minutes of moderate-intensity exercise (any activity that increases your heart rate and causes you to sweat) each week. In addition, most adults need muscle-strengthening exercises on 2 or more days a week.  Silver Sneakers may be a benefit available to you. To determine eligibility, you may visit the website: www.silversneakers.com or contact program at 732-635-7805 Mon-Fri between 8AM-8PM.   . Maintain a healthy weight. The body mass index (BMI) is a screening tool to identify possible weight problems. It provides an estimate of body fat based on height and weight. Your health care provider can find your BMI and can help you achieve or maintain a healthy weight.   For adults 20 years and older: ? A BMI below 18.5 is considered underweight. ? A BMI of 18.5 to 24.9 is normal. ? A BMI of 25 to 29.9 is considered overweight. ? A BMI of 30 and above is considered obese.   . Maintain normal blood lipids and cholesterol levels by exercising and minimizing your intake of saturated fat. Eat a balanced diet with plenty of fruit and vegetables. Blood tests for lipids and cholesterol should begin at age 22 and be repeated every 5 years. If your lipid or cholesterol levels are high, you are over 50, or you are at high risk for heart disease, you may need your cholesterol levels checked more frequently. Ongoing high lipid and cholesterol levels should be treated with medicines if diet and exercise are not working.  . If you smoke, find out from your health care provider how to quit. If you do not  use tobacco, please do not start.  . If you choose to drink alcohol, please do not consume more than 2 drinks per day. One drink is considered to be 12 ounces (355 mL) of beer, 5 ounces (148 mL) of wine, or 1.5 ounces (44 mL) of liquor.  . If you are 77-36 years old, ask your health care provider if you should take aspirin to prevent strokes.  . Use sunscreen. Apply sunscreen liberally and repeatedly throughout the day. You should seek shade when your shadow is shorter than you. Protect yourself by wearing long sleeves, pants, a wide-brimmed hat, and sunglasses year round, whenever you are outdoors.  . Once a month, do a whole body skin exam, using a mirror to look at the skin on your back. Tell your health care provider of new moles, moles that have irregular borders, moles that are larger than a pencil eraser, or moles that have changed in shape or color.

## 2017-04-01 ENCOUNTER — Ambulatory Visit: Payer: Medicare Other | Admitting: Nurse Practitioner

## 2017-04-19 ENCOUNTER — Ambulatory Visit: Payer: Medicare Other | Admitting: Family Medicine

## 2017-04-26 ENCOUNTER — Ambulatory Visit: Payer: Medicare Other | Admitting: Family Medicine

## 2017-05-12 ENCOUNTER — Emergency Department (HOSPITAL_COMMUNITY)
Admission: EM | Admit: 2017-05-12 | Discharge: 2017-05-12 | Disposition: A | Discharge status: Home / Self Care | Payer: Medicare Other | Attending: Emergency Medicine | Admitting: Emergency Medicine

## 2017-05-12 ENCOUNTER — Encounter (HOSPITAL_COMMUNITY): Payer: Self-pay | Admitting: Emergency Medicine

## 2017-05-12 ENCOUNTER — Other Ambulatory Visit: Payer: Self-pay

## 2017-05-12 DIAGNOSIS — F17210 Nicotine dependence, cigarettes, uncomplicated: Secondary | ICD-10-CM | POA: Insufficient documentation

## 2017-05-12 DIAGNOSIS — J40 Bronchitis, not specified as acute or chronic: Secondary | ICD-10-CM | POA: Insufficient documentation

## 2017-05-12 DIAGNOSIS — Z79899 Other long term (current) drug therapy: Secondary | ICD-10-CM | POA: Insufficient documentation

## 2017-05-12 DIAGNOSIS — Z7982 Long term (current) use of aspirin: Secondary | ICD-10-CM | POA: Insufficient documentation

## 2017-05-12 DIAGNOSIS — R05 Cough: Secondary | ICD-10-CM | POA: Diagnosis present

## 2017-05-12 MED ORDER — PREDNISONE 20 MG PO TABS: 40.0000 mg | ORAL_TABLET | Freq: Every day | ORAL | 0 refills | Status: DC

## 2017-05-12 MED ORDER — AMOXICILLIN 500 MG PO CAPS: 1000.0000 mg | ORAL_CAPSULE | Freq: Two times a day (BID) | ORAL | 0 refills | Status: DC

## 2017-05-12 MED ORDER — ALBUTEROL SULFATE HFA 108 (90 BASE) MCG/ACT IN AERS
2.0000 | INHALATION_SPRAY | RESPIRATORY_TRACT | Status: DC | PRN
  Administered 2017-05-12: 2 via RESPIRATORY_TRACT
  Filled 2017-05-12: qty 6.7

## 2017-05-12 NOTE — ED Provider Notes (Signed)
[facility] EMERGENCY DEPARTMENT Provider Note   CSN: [id number] Arrival date & time: 05/12/17  0019     History   Chief Complaint Chief Complaint  Patient presents with  . Cough    HPI Matthew Adkins is a 67 y.o. male.  Patient presents to the emergency department for evaluation of cough.  Symptoms began earlier today.  Patient reports a history of recurrent bronchitis with similar symptoms.  He has not had any fever.  There is no chest pain.  Cough is nonproductive.     Past Medical History:  Diagnosis Date  . Bladder cancer ([place]) 2000  . Depression   . Nicotine addiction   . Prostate cancer ([place]) 07/06/2014  . Seasonal allergies     Patient Active Problem List   Diagnosis Date Noted  . History of colonic polyps   . Gastritis and duodenitis   . Heme + stool 01/04/2017  . Tubular adenoma of colon 11/18/2016  . Encounter for examination following treatment at hospital 11/18/2016  . Left shoulder pain 10/30/2015  . Prostate cancer ([place]) 07/25/2014  . Annual physical exam 06/07/2014  . Hemorrhoids, internal 09/30/2011  . Arthritis of knee, degenerative 09/30/2011  . Degenerative disc disease, lumbar 09/30/2011  . Pulmonary nodule 09/21/2011  . Depression 06/25/2010  . SKIN TAG 10/21/2009  . ALLERGIC RHINITIS 09/19/2009  . Dyslipidemia 08/17/2008  . Prediabetes 10/14/2007  . BLADDER CANCER 04/13/2007  . NICOTINE ADDICTION 04/13/2007    Past Surgical History:  Procedure Laterality Date  . athroscopy of right knee  2002  . cancerous polyps removed from bladder  2000  . COLONOSCOPY  MJ 2009 pmhX: POLYPS   POLYP REMOVED BUT NOT RETRIEVED  . COLONOSCOPY  LS 2007 ARS   ? POLYPS, no path available  . COLONOSCOPY  09/26/2010   sessile poylp(2) pan-colonic diverticulosis,mild/internal hemorrhoids  . COLONOSCOPY WITH PROPOFOL N/A 02/09/2017   Procedure: COLONOSCOPY WITH PROPOFOL;  Surgeon: Danie Binder, MD;  Location: [facility] ENDO SUITE;  Service: Endoscopy;   Laterality: N/A;  8:30am  . ESOPHAGOGASTRODUODENOSCOPY (EGD) WITH PROPOFOL N/A 02/09/2017   Procedure: ESOPHAGOGASTRODUODENOSCOPY (EGD) WITH PROPOFOL;  Surgeon: Danie Binder, MD;  Location: [facility] SUITE;  Service: Endoscopy;  Laterality: N/A;  . FOOT SURGERY Bilateral 2004  . HERNIA REPAIR  1980's   left inguinal hernia  . LYMPHADENECTOMY Bilateral 09/03/2014   Procedure: PELVIC LYMPHADENECTOMY;  Surgeon: Raynelle Bring, MD;  Location: [facility] ORS;  Service: Urology;  Laterality: Bilateral;  . POLYPECTOMY  02/09/2017   Procedure: POLYPECTOMY;  Surgeon: Danie Binder, MD;  Location: [facility];  Service: Endoscopy;;  cecal polyp, hepatic flexure polyps x3, transverse colon polyp, descending colon polyps x2, rectal polyps x2  . PROSTATE BIOPSY  07/06/14  . ROBOT ASSISTED LAPAROSCOPIC RADICAL PROSTATECTOMY N/A 09/03/2014   Procedure: ROBOTIC ASSISTED LAPAROSCOPIC RADICAL PROSTATECTOMY LEVEL 2;  Surgeon: Raynelle Bring, MD;  Location: [facility] OR[facility];  Service: Urology;  Laterality: N/A;        Home Medications    Prior to Admission medications   Medication Sig Start Date End Date Taking? Authorizing Provider  acetaminophen (TYLENOL) 500 MG tablet Take 1,000 mg by mouth every 6 (six) hours as needed for mild pain.    [provider]  amoxicillin (AMOXIL) 500 MG capsule Take 2 capsules (1,000 mg total) by mouth 2 (two) times daily. 05/12/17   Orpah Greek, MD  aspirin 81 MG tablet Take 81 mg by mouth daily.    [provider]  FLUoxetine (PROZAC) 20 MG capsule Take 1 capsule (20 mg total) by mouth daily. 02/25/17   Fayrene Helper, MD  Multiple Vitamins-Minerals (ONE DAILY MENS 50+ MULTIVIT PO) Take 1 tablet by mouth daily.    [provider]  omeprazole (PRILOSEC) 20 MG capsule 1 PO 30 MINS PRIOR TO BREAKFAST. 02/09/17   Fields, Marga Melnick, MD  predniSONE (DELTASONE) 20 MG tablet Take 2 tablets (40 mg total) by mouth daily with breakfast. 05/12/17   Armonte Tortorella, Gwenyth Allegra, MD      Family History Family History  Problem Relation Age of Onset  . Heart attack Father   . Diabetes Sister   . Hypertension Sister   . Hypertension Sister   . Aneurysm Sister        brain   . Arthritis Unknown        family history   . Colon cancer Neg Hx   . Colon polyps Neg Hx     Social History Social History   Tobacco Use  . Smoking status: Current Every Day Smoker    Packs/day: 0.50    Years: 30.00    Pack years: 15.00    Types: Cigarettes  . Smokeless tobacco: Never Used  Substance Use Topics  . Alcohol use: Yes    Comment: 3 pints/month  . Drug use: Yes    Types: Marijuana    Comment: occassionally     Allergies   Bee venom and Codeine   Review of Systems Review of Systems  HENT: Positive for congestion.   Respiratory: Positive for cough.   All other systems reviewed and are negative.    Physical Exam Updated Vital Signs BP (!) 153/93 (BP Location: Left Arm)   Pulse 63   Temp 98.6 F (37 C) (Oral)   Resp 20   Ht 6\' 3"  (1.905 m)   Wt 93 kg (205 lb)   SpO2 96%   BMI 25.62 kg/m   Physical Exam  Constitutional: He is oriented to person, place, and time. He appears well-developed and well-nourished. No distress.  HENT:  Head: Normocephalic and atraumatic.  Right Ear: Hearing normal.  Left Ear: Hearing normal.  Nose: Nose normal.  Mouth/Throat: Oropharynx is clear and moist and mucous membranes are normal.  Eyes: Pupils are equal, round, and reactive to light. Conjunctivae and EOM are normal.  Neck: Normal range of motion. Neck supple.  Cardiovascular: Regular rhythm, S1 normal and S2 normal. Exam reveals no gallop and no friction rub.  No murmur heard. Pulmonary/Chest: Effort normal and breath sounds normal. No respiratory distress. He exhibits no tenderness.  Abdominal: Soft. Normal appearance and bowel sounds are normal. There is no hepatosplenomegaly. There is no tenderness. There is no rebound, no guarding, no tenderness at McBurney's  point and negative Murphy's sign. No hernia.  Musculoskeletal: Normal range of motion.  Neurological: He is alert and oriented to person, place, and time. He has normal strength. No cranial nerve deficit or sensory deficit. Coordination normal. GCS eye subscore is 4. GCS verbal subscore is 5. GCS motor subscore is 6.  Skin: Skin is warm, dry and intact. No rash noted. No cyanosis.  Psychiatric: He has a normal mood and affect. His speech is normal and behavior is normal. Thought content normal.  Nursing note and vitals reviewed.    ED Treatments / Results  Labs (all labs ordered are listed, but only abnormal results are displayed) Labs Reviewed - No data to display  EKG None  Radiology No results found.  Procedures Procedures (including critical care time)  Medications Ordered in ED Medications  albuterol (PROVENTIL HFA;VENTOLIN HFA) 108 (90 Base) MCG/ACT inhaler 2 puff (has no administration in time range)     Initial Impression / Assessment and Plan / ED Course  I have reviewed the triage vital signs and the nursing notes.  Pertinent labs & imaging results that were available during my care of the patient were reviewed by me and considered in my medical decision making (see chart for details).     Patient with long tobacco abuse history but no firm diagnosis of COPD presents to the ER for evaluation of cough.  Lung examination does not raise suspicion for pneumonia.  He does not have active wheezing or hypoxia.  Final Clinical Impressions(s) / ED Diagnoses   Final diagnoses:  Bronchitis    ED Discharge Orders        Ordered    predniSONE (DELTASONE) 20 MG tablet  Daily with breakfast     05/12/17 0054    amoxicillin (AMOXIL) 500 MG capsule  2 times daily     05/12/17 0054       Orpah Greek, MD 05/12/17 [phone]

## 2017-05-12 NOTE — ED Triage Notes (Signed)
Pt here for cough x1 day.

## 2017-05-13 ENCOUNTER — Ambulatory Visit: Payer: Medicare Other | Admitting: Family Medicine

## 2017-06-14 ENCOUNTER — Ambulatory Visit (HOSPITAL_COMMUNITY)
Admission: RE | Admit: 2017-06-14 | Discharge: 2017-06-14 | Disposition: A | Discharge status: Home / Self Care | Payer: Medicare Other | Source: Ambulatory Visit | Attending: Acute Care | Admitting: Acute Care

## 2017-06-14 DIAGNOSIS — I700 Atherosclerosis of aorta: Secondary | ICD-10-CM | POA: Diagnosis not present

## 2017-06-14 DIAGNOSIS — F17210 Nicotine dependence, cigarettes, uncomplicated: Secondary | ICD-10-CM | POA: Diagnosis present

## 2017-06-14 DIAGNOSIS — J439 Emphysema, unspecified: Secondary | ICD-10-CM | POA: Diagnosis not present

## 2017-06-14 DIAGNOSIS — Z122 Encounter for screening for malignant neoplasm of respiratory organs: Secondary | ICD-10-CM | POA: Diagnosis not present

## 2017-06-16 ENCOUNTER — Other Ambulatory Visit: Payer: Self-pay | Admitting: Acute Care

## 2017-06-16 DIAGNOSIS — F17210 Nicotine dependence, cigarettes, uncomplicated: Principal | ICD-10-CM

## 2017-06-16 DIAGNOSIS — Z122 Encounter for screening for malignant neoplasm of respiratory organs: Secondary | ICD-10-CM

## 2017-06-24 ENCOUNTER — Ambulatory Visit: Payer: Medicare Other | Admitting: Family Medicine

## 2017-06-29 ENCOUNTER — Ambulatory Visit (INDEPENDENT_AMBULATORY_CARE_PROVIDER_SITE_OTHER): Payer: Medicare Other | Admitting: Family Medicine

## 2017-06-29 ENCOUNTER — Other Ambulatory Visit: Payer: Self-pay

## 2017-06-29 ENCOUNTER — Encounter: Payer: Self-pay | Admitting: Family Medicine

## 2017-06-29 VITALS — BP 124/80 | HR 60 | Resp 14 | Ht 75.0 in | Wt 208.1 lb

## 2017-06-29 DIAGNOSIS — F17210 Nicotine dependence, cigarettes, uncomplicated: Secondary | ICD-10-CM | POA: Diagnosis not present

## 2017-06-29 DIAGNOSIS — F329 Major depressive disorder, single episode, unspecified: Secondary | ICD-10-CM | POA: Diagnosis not present

## 2017-06-29 DIAGNOSIS — Z8546 Personal history of malignant neoplasm of prostate: Secondary | ICD-10-CM

## 2017-06-29 DIAGNOSIS — E559 Vitamin D deficiency, unspecified: Secondary | ICD-10-CM

## 2017-06-29 DIAGNOSIS — E785 Hyperlipidemia, unspecified: Secondary | ICD-10-CM | POA: Diagnosis not present

## 2017-06-29 DIAGNOSIS — F32A Depression, unspecified: Secondary | ICD-10-CM

## 2017-06-29 DIAGNOSIS — R7303 Prediabetes: Secondary | ICD-10-CM

## 2017-06-29 DIAGNOSIS — Z86010 Personal history of colonic polyps: Secondary | ICD-10-CM

## 2017-06-29 DIAGNOSIS — F17200 Nicotine dependence, unspecified, uncomplicated: Secondary | ICD-10-CM | POA: Diagnosis not present

## 2017-06-29 MED ORDER — NICOTINE 10 MG IN INHA: RESPIRATORY_TRACT | 0 refills | Status: DC

## 2017-06-29 NOTE — Patient Instructions (Addendum)
Physical exam with MD Nov 13 or after , call if you need me sooner  No smoking after you start the nicotrol inhaler which you will use for maximum of 4 weeks, lowest dose, now smoking 4 ciggs/ day, need to [place] ( will send in later)  I will refer you for urology follow up  Fasting lipid, cmp and EGFR, hBA1C and TSH and vit D are past due   Next colonoscopy in 3 years  [place] call 24/7 for help    Steps to Quit Smoking Smoking tobacco can be bad for your health. It can also affect almost every organ in your body. Smoking puts you and people around you at risk for many serious long-lasting (chronic) diseases. Quitting smoking is hard, but it is one of the best things that you can do for your health. It is never too late to quit. What are the benefits of quitting smoking? When you quit smoking, you lower your risk for getting serious diseases and conditions. They can include:  Lung cancer or lung disease.  Heart disease.  Stroke.  Heart attack.  Not being able to have children (infertility).  Weak bones (osteoporosis) and broken bones (fractures).  If you have coughing, wheezing, and shortness of breath, those symptoms may get better when you quit. You may also get sick less often. If you are pregnant, quitting smoking can help to lower your chances of having a baby of low birth weight. What can I do to help me quit smoking? Talk with your doctor about what can help you quit smoking. Some things you can do (strategies) include:  Quitting smoking totally, instead of slowly cutting back how much you smoke over a period of time.  Going to in-person counseling. You are more likely to quit if you go to many counseling sessions.  Using resources and support systems, such as: ? Database administrator with a Social worker. ? Phone quitlines. ? Careers information officer. ? Support groups or group counseling. ? Text messaging programs. ? Mobile phone apps or applications.  Taking  medicines. Some of these medicines may have nicotine in them. If you are pregnant or breastfeeding, do not take any medicines to quit smoking unless your doctor says it is okay. Talk with your doctor about counseling or other things that can help you.  Talk with your doctor about using more than one strategy at the same time, such as taking medicines while you are also going to in-person counseling. This can help make quitting easier. What things can I do to make it easier to quit? Quitting smoking might feel very hard at first, but there is a lot that you can do to make it easier. Take these steps:  Talk to your family and friends. Ask them to support and encourage you.  Call phone quitlines, reach out to support groups, or work with a Social worker.  Ask people who smoke to not smoke around you.  Avoid places that make you want (trigger) to smoke, such as: ? Bars. ? Parties. ? Smoke-break areas at work.  Spend time with people who do not smoke.  Lower the stress in your life. Stress can make you want to smoke. Try these things to help your stress: ? Getting regular exercise. ? Deep-breathing exercises. ? Yoga. ? Meditating. ? Doing a body scan. To do this, close your eyes, focus on one area of your body at a time from head to toe, and notice which parts of your body are tense.  Try to relax the muscles in those areas.  Download or buy apps on your mobile phone or tablet that can help you stick to your quit plan. There are many free apps, such as QuitGuide from the [employer] Office manager for Disease Control and Prevention). You can find more support from smokefree.gov and other websites.  This information is not intended to replace advice given to you by your health care provider. Make sure you discuss any questions you have with your health care provider. Document Released: 10/18/2008 Document Revised: 08/20/2015 Document Reviewed: 05/08/2014 Elsevier Interactive Patient Education  2018 [employer].

## 2017-07-02 ENCOUNTER — Other Ambulatory Visit (HOSPITAL_COMMUNITY)
Admission: RE | Admit: 2017-07-02 | Discharge: 2017-07-02 | Disposition: A | Discharge status: Home / Self Care | Payer: Medicare Other | Source: Ambulatory Visit | Attending: Family Medicine | Admitting: Family Medicine

## 2017-07-02 DIAGNOSIS — R7303 Prediabetes: Secondary | ICD-10-CM | POA: Diagnosis present

## 2017-07-02 DIAGNOSIS — E785 Hyperlipidemia, unspecified: Secondary | ICD-10-CM | POA: Insufficient documentation

## 2017-07-02 DIAGNOSIS — E559 Vitamin D deficiency, unspecified: Secondary | ICD-10-CM | POA: Diagnosis present

## 2017-07-02 LAB — LIPID PANEL
Cholesterol: 178 mg/dL (ref 0–200)
HDL: 37 mg/dL — AB (ref >40)
LDL CALC: 119 mg/dL — AB (ref 0–99)
TRIGLYCERIDES: 111 mg/dL (ref <150)
Total CHOL/HDL Ratio: 4.8 RATIO
VLDL: 22 mg/dL (ref 0–40)

## 2017-07-02 LAB — CBC
HCT: 43.7 % (ref 39.0–52.0)
Hemoglobin: 14.7 g/dL (ref 13.0–17.0)
MCH: 31.5 pg (ref 26.0–34.0)
MCHC: 33.6 g/dL (ref 30.0–36.0)
MCV: 93.8 fL (ref 78.0–100.0)
Platelets: 233 10*3/uL (ref 150–400)
RBC: 4.66 MIL/uL (ref 4.22–5.81)
RDW: 13.3 % (ref 11.5–15.5)
WBC: 5.7 10*3/uL (ref 4.0–10.5)

## 2017-07-02 LAB — HEMOGLOBIN A1C
HEMOGLOBIN A1C: 6.4 % — AB (ref 4.8–5.6)
MEAN PLASMA GLUCOSE: 136.98 mg/dL

## 2017-07-02 LAB — TSH: TSH: 1.53 u[IU]/mL (ref 0.350–4.500)

## 2017-07-03 LAB — VITAMIN D 25 HYDROXY (VIT D DEFICIENCY, FRACTURES): VIT D 25 HYDROXY: 19.7 ng/mL — AB (ref 30.0–100.0)

## 2017-07-11 ENCOUNTER — Encounter: Payer: Self-pay | Admitting: Family Medicine

## 2017-07-11 DIAGNOSIS — Z8546 Personal history of malignant neoplasm of prostate: Secondary | ICD-10-CM | POA: Insufficient documentation

## 2017-07-11 DIAGNOSIS — Z8551 Personal history of malignant neoplasm of bladder: Secondary | ICD-10-CM | POA: Insufficient documentation

## 2017-07-11 NOTE — Assessment & Plan Note (Addendum)
Controlled on current medication, under a lot of stress caring for his wife with severe mental illness who can no longer be a companion for him, and also for his sister with severe dementia States he has no need for therapy and feels as though he is coping well, screen raises no red flags

## 2017-07-11 NOTE — Progress Notes (Signed)
Matthew Adkins     MRN: [id number]      DOB: 04/20/1950   HPI Matthew Adkins is here for follow up and re-evaluation of chronic medical conditions, medication management and review of any available recent lab and radiology data.  Preventive health is updated, specifically  Cancer screening and Immunization.   Questions or concerns regarding consultations or procedures which the PT has had in the interim are  Addressed.Had colonoscopy in February, multiple polyps, 9 total removed, needs rept in 3 years. The PT denies any adverse reactions to current medications since the last visit.  There are no new concerns.  There are no specific complaints   ROS Denies recent fever or chills. Denies sinus pressure, nasal congestion, ear pain or sore throat. Denies chest congestion, productive cough or wheezing. Denies chest pains, palpitations and leg swelling Denies abdominal pain, nausea, vomiting,diarrhea or constipation.   Denies dysuria, frequency, hesitancy or incontinence. Denies joint pain, swelling and limitation in mobility. Denies headaches, seizures, numbness, or tingling. Denies depression, anxiety or insomnia. Denies skin break down or rash.   PE  BP 124/80 (BP Location: Left Arm, Patient Position: Sitting, Cuff Size: Large)   Pulse 60   Resp 14   Ht 6\' 3"  (1.905 m)   Wt 208 lb 1.3 oz (94.4 kg)   BMI 26.01 kg/m   Patient alert and oriented and in no cardiopulmonary distress.  HEENT: No facial asymmetry, EOMI,   oropharynx pink and moist.  Neck supple no JVD, no mass.  Chest: Clear to auscultation bilaterally.  CVS: S1, S2 no murmurs, no S3.Regular rate.  ABD: Soft non tender.   Ext: No edema  MS: Adequate ROM spine, shoulders, hips and knees.  Skin: Intact, no ulcerations or rash noted.  Psych: Good eye contact, normal affect. Memory intact not anxious or depressed appearing.  CNS: CN 2-12 intact, power,  normal throughout.no focal deficits noted.   Assessment  & Plan  H/O prostate cancer Refer to Matthew Adkins for follow up who last saw hi in 2017  History of colonic polyps Pt at increased risk for colon cancer and continues to smoke , he is re educated re need to quit and nicotrol inhaler prescribed, currrently smoking 4 / day reportedly  Depression Controlled on current medication, under a lot of stress caring for his wife with severe mental illness who can no longer be a companion for him, and also for his sister with severe dementia States he has no need for therapy and feels as though he is coping well, screen raises no red flags  Dyslipidemia Hyperlipidemia:Low fat diet discussed and encouraged.   Lipid Panel  Lab Results  Component Value Date   CHOL 178 07/02/2017   HDL 37 (L) 07/02/2017   LDLCALC 119 (H) 07/02/2017   LDLDIRECT 105 08/22/2014   TRIG 111 07/02/2017   CHOLHDL 4.8 07/02/2017   Needs to reduce fried and fatty foods as LDL is above goal    Prediabetes Patient educated about the importance of limiting  Carbohydrate intake , the need to commit to daily physical activity for a minimum of 30 minutes , and to commit weight loss. The fact that changes in all these areas will reduce or eliminate all together the development of diabetes is stressed.  Deteriorated patient encouraged to reduce intake of sweets and starchy foods  Diabetic Labs Latest Ref Rng & Units 07/02/2017 02/05/2017 11/18/2016 11/14/2016 06/30/2016  HbA1c 4.8 - 5.6 % 6.4(H) - 6.1(H) - 6.0(H)  Chol 0 - 200 mg/dL 178 - - - 178  HDL >40 mg/dL 37(L) - - - 49  Calc LDL 0 - 99 mg/dL 119(H) - - - 115(H)  Triglycerides <150 mg/dL 111 - - - 70  Creatinine 0.61 - 1.24 mg/dL - 0.89 - 0.91 1.03   BP/Weight 06/29/2017 05/12/2017 03/15/2017 02/09/2017 02/05/2017 01/04/2017 94/17/4081  Systolic BP 448 185 631 497 [phone]  Diastolic BP 80 93 70 71 84 80 70  Wt. (Lbs) 208.08 205 207.75 205 210 205.2 200  BMI 26.01 25.62 25.97 25.62 26.25 25.65 25   No flowsheet  data found.    NICOTINE ADDICTION Asked: confirms 4 cigarettes / day Assess:  Unwilling to set quit date, uses cigarettes for stress relief reportedly Advise : high personal cancer risk, h/o bladder and prostate cancer, also increase d colon polyps, and under lung cancer surveillance, also heart disease and stroke risk increased Assist: will erx nicotrol inhaler , 1800 QUITNOW # provided, educated x  4 mins and literature provided Arrange : f/u in 5 monhs

## 2017-07-11 NOTE — Assessment & Plan Note (Signed)
Hyperlipidemia:Low fat diet discussed and encouraged.   Lipid Panel  Lab Results  Component Value Date   CHOL 178 07/02/2017   HDL 37 (L) 07/02/2017   LDLCALC 119 (H) 07/02/2017   LDLDIRECT 105 08/22/2014   TRIG 111 07/02/2017   CHOLHDL 4.8 07/02/2017   Needs to reduce fried and fatty foods as LDL is above goal

## 2017-07-11 NOTE — Assessment & Plan Note (Signed)
Pt at increased risk for colon cancer and continues to smoke , he is re educated re need to quit and nicotrol inhaler prescribed, currrently smoking 4 / day reportedly

## 2017-07-11 NOTE — Assessment & Plan Note (Signed)
Refer to Dr. Raynelle Bring for follow up who last saw hi in 2017

## 2017-07-11 NOTE — Assessment & Plan Note (Signed)
Asked: confirms 4 cigarettes / day Assess:  Unwilling to set quit date, uses cigarettes for stress relief reportedly Advise : high personal cancer risk, h/o bladder and prostate cancer, also increase d colon polyps, and under lung cancer surveillance, also heart disease and stroke risk increased Assist: will erx nicotrol inhaler , 1800 QUITNOW # provided, educated x  4 mins and literature provided Arrange : f/u in 5 monhs

## 2017-07-11 NOTE — Assessment & Plan Note (Signed)
Patient educated about the importance of limiting  Carbohydrate intake , the need to commit to daily physical activity for a minimum of 30 minutes , and to commit weight loss. The fact that changes in all these areas will reduce or eliminate all together the development of diabetes is stressed.  Deteriorated patient encouraged to reduce intake of sweets and starchy foods  Diabetic Labs Latest Ref Rng & Units 07/02/2017 02/05/2017 11/18/2016 11/14/2016 06/30/2016  HbA1c 4.8 - 5.6 % 6.4(H) - 6.1(H) - 6.0(H)  Chol 0 - 200 mg/dL 178 - - - 178  HDL >40 mg/dL 37(L) - - - 49  Calc LDL 0 - 99 mg/dL 119(H) - - - 115(H)  Triglycerides <150 mg/dL 111 - - - 70  Creatinine 0.61 - 1.24 mg/dL - 0.89 - 0.91 1.03   BP/Weight 06/29/2017 05/12/2017 03/15/2017 02/09/2017 02/05/2017 01/04/2017 57/32/2025  Systolic BP 427 062 376 283 151 761 607  Diastolic BP 80 93 70 71 84 80 70  Wt. (Lbs) 208.08 205 207.75 205 210 205.2 200  BMI 26.01 25.62 25.97 25.62 26.25 25.65 25   No flowsheet data found.

## 2017-07-13 ENCOUNTER — Ambulatory Visit: Payer: Medicare Other | Admitting: Nurse Practitioner

## 2017-07-13 ENCOUNTER — Encounter: Payer: Self-pay | Admitting: Nurse Practitioner

## 2017-07-13 VITALS — BP 135/83 | HR 68 | Temp 97.3°F | Ht 75.0 in | Wt 206.2 lb

## 2017-07-13 DIAGNOSIS — R195 Other fecal abnormalities: Secondary | ICD-10-CM | POA: Diagnosis not present

## 2017-07-13 DIAGNOSIS — K219 Gastro-esophageal reflux disease without esophagitis: Secondary | ICD-10-CM | POA: Diagnosis not present

## 2017-07-13 DIAGNOSIS — D126 Benign neoplasm of colon, unspecified: Secondary | ICD-10-CM | POA: Diagnosis not present

## 2017-07-13 NOTE — Assessment & Plan Note (Signed)
GERD symptoms significantly improved on PPI.  No breakthrough.  Recommend he continue PPI indefinitely given gastritis and need for daily baby aspirin.  Follow-up in 1 year.

## 2017-07-13 NOTE — Assessment & Plan Note (Signed)
Multiple adenomas found on his recent colonoscopy.  Recommended 3-year surveillance exam.  The patient verbalized understanding.  He is on recall.  Follow-up as needed.

## 2017-07-13 NOTE — Assessment & Plan Note (Signed)
Heme positive stool deemed due to gastritis/erosive duodenitis/colon polyps.  Recommended indefinite PPI.  He is on recall for 3-year surveillance colonoscopy as well.  He has not noted any obvious bleeding.  No signs or symptoms of anemia.  He is still taking his acid blocker.  Recommend he continue PPI and other medications, follow-up in 1 year.  Call if any worsening symptoms, recurrent symptoms, noted/obvious bleeding.

## 2017-07-13 NOTE — Patient Instructions (Signed)
1. Continue taking your acid blocker omeprazole (Prilosec) indefinitely. 2. Continue your other medications. 3. We will have you follow-up in 1 year. 4. Call us if you have any questions, concerns, worsening symptoms. 5. Call us if you see any blood in your stools or black stools.  At [facility] we value your feedback. You may receive a survey about your visit today. Please share your experience as we strive to create trusting relationships with our patients to provide genuine, compassionate, quality care.  It was great to see you today!  I hope you have a great summer!!

## 2017-07-13 NOTE — Progress Notes (Signed)
Referring Provider: Fayrene Helper, MD Primary Care Physician:  Fayrene Helper, MD Primary GI:  Dr. Oneida Alar  Chief Complaint  Patient presents with  . heme + stool    f/u. Doing okay    HPI:   Matthew Adkins is a 67 y.o. male who presents for follow-up on heme positive stool.  Patient was last seen in our office 01/04/2017 for the same.  He was previously noted to be heme positive and typically has been heme-negative at his physicals.  History of multiple polyps found to be tubular adenoma.  Previous colonoscopy 2012 and recommended 10-year repeat.  No obvious hematochezia or melena.  Recommended colonoscopy with possible upper endoscopy for evaluation of heme positive stools of unknown source.  Recommended propofol.  During his previous visit he did note GERD/"indigestion" symptoms about twice a week and no acute changes in bowel habits with 5 bowel movements a week, not hard, no straining.  Recommended colonoscopy with possible upper endoscopy and 4-month follow-up.  EGD was completed 02/09/2017 which found low-grade narrowing Schatzki's ring, heme positive stool due to gastritis/erosive duodenitis.  Surgical pathology found the biopsies to be mild gastritis without H. pylori.  Colonoscopy completed the same day found two 48 mm polyps at the hepatic flexure, three to 5 mm polyps in the transverse colon, four to 4 mm polyps in the rectum and descending colon, external and internal hemorrhoids.  Surgical pathology found the polyps to be a mix of tubular adenoma and hyperplastic polyps. Recommended omeprazole 20 mg daily indefinitely, be colonoscopy in 3 years (verified recall for 2022), follow-up in 6 months.  Today he states he's doing ok overall. GERD symptoms are a lot better, no breakthrough symptoms. Denies abdominal pain, N/V, hematochezia, melena, fever, chills, unintentional weight loss. Denies chest pain, dyspnea, dizziness, lightheadedness, syncope, near syncope. Denies any  other upper or lower GI symptoms.  Past Medical History:  Diagnosis Date  . Bladder cancer ([place]) 2000  . Depression   . Nicotine addiction   . Prostate cancer ([place]) 07/06/2014  . Seasonal allergies     Past Surgical History:  Procedure Laterality Date  . athroscopy of right knee  2002  . cancerous polyps removed from bladder  2000  . COLONOSCOPY  MJ 2009 pmhX: POLYPS   POLYP REMOVED BUT NOT RETRIEVED  . COLONOSCOPY  LS 2007 ARS   ? POLYPS, no path available  . COLONOSCOPY  09/26/2010   sessile poylp(2) pan-colonic diverticulosis,mild/internal hemorrhoids  . COLONOSCOPY WITH PROPOFOL N/A 02/09/2017   Procedure: COLONOSCOPY WITH PROPOFOL;  Surgeon: Danie Binder, MD;  [facility];  Service: Endoscopy;  Laterality: N/A;  8:30am  . ESOPHAGOGASTRODUODENOSCOPY (EGD) WITH PROPOFOL N/A 02/09/2017   Procedure: ESOPHAGOGASTRODUODENOSCOPY (EGD) WITH PROPOFOL;  Surgeon: Danie Binder, MD;  Location: [facility];  Service: Endoscopy;  Laterality: N/A;  . FOOT SURGERY Bilateral 2004  . HERNIA REPAIR  1980's   left inguinal hernia  . LYMPHADENECTOMY Bilateral 09/03/2014   Procedure: PELVIC LYMPHADENECTOMY;  Surgeon: Raynelle Bring, MD;  Location: [facility];  Service: Urology;  Laterality: Bilateral;  . POLYPECTOMY  02/09/2017   Procedure: POLYPECTOMY;  Surgeon: Danie Binder, MD;  Location: [facility];  Service: Endoscopy;;  cecal polyp, hepatic flexure polyps x3, transverse colon polyp, descending colon polyps x2, rectal polyps x2  . PROSTATE BIOPSY  07/06/14  . ROBOT ASSISTED LAPAROSCOPIC RADICAL PROSTATECTOMY N/A 09/03/2014   Procedure: ROBOTIC ASSISTED LAPAROSCOPIC RADICAL PROSTATECTOMY LEVEL 2;  Surgeon: Wynetta Emery  Alinda Money, MD;  Location: [facility];  Service: Urology;  Laterality: N/A;    Current Outpatient Medications  Medication Sig Dispense Refill  . acetaminophen (TYLENOL) 500 MG tablet Take 1,000 mg by mouth every 6 (six) hours as needed for mild pain.    Marland Kitchen aspirin 81 MG tablet Take  81 mg by mouth daily.    Marland Kitchen FLUoxetine (PROZAC) 20 MG capsule Take 1 capsule (20 mg total) by mouth daily. 90 capsule 1  . Multiple Vitamins-Minerals (ONE DAILY MENS 50+ MULTIVIT PO) Take 1 tablet by mouth daily.    . nicotine (NICOTROL) 10 MG inhaler Use between 6-16 cartridges daily as needed 42 each 0  . omeprazole (PRILOSEC) 20 MG capsule 1 PO 30 MINS PRIOR TO BREAKFAST. 90 capsule 3   No current facility-administered medications for this visit.     Allergies as of 07/13/2017 - Review Complete 07/13/2017  Allergen Reaction Noted  . Bee venom  09/06/2011  . Codeine Nausea And Vomiting 01/03/2009    Family History  Problem Relation Age of Onset  . Heart attack Father   . Diabetes Sister   . Hypertension Sister   . Hypertension Sister   . Aneurysm Sister        brain   . Arthritis Unknown        family history   . Colon cancer Neg Hx   . Colon polyps Neg Hx     Social History   Socioeconomic History  . Marital status: Married    Spouse name: Not on file  . Number of children: 2  . Years of education: Not on file  . Highest education level: Not on file  Occupational History  . Occupation: unemployed   Social Needs  . Financial resource strain: Somewhat hard  . Food insecurity:    Worry: Never true    Inability: Never true  . Transportation needs:    Medical: No    Non-medical: No  Tobacco Use  . Smoking status: Current Every Day Smoker    Packs/day: 0.50    Years: 30.00    Pack years: 15.00    Types: Cigarettes  . Smokeless tobacco: Never Used  Substance and Sexual Activity  . Alcohol use: Yes    Comment: 3 pints/month  . Drug use: Yes    Types: Marijuana    Comment: occassionally  . Sexual activity: Yes    Birth control/protection: None  Lifestyle  . Physical activity:    Days per week: 2 days    Minutes per session: 30 min  . Stress: Not at all  Relationships  . Social connections:    Talks on phone: More than three times a week    Gets together:  More than three times a week    Attends religious service: More than 4 times per year    Active member of club or organization: Yes    Attends meetings of clubs or organizations: More than 4 times per year    Relationship status: Married  Other Topics Concern  . Not on file  Social History Narrative  . Not on file    Review of Systems: Complete ROS negative except as per HPI.   Physical Exam: BP 135/83   Pulse 68   Temp (!) 97.3 F (36.3 C) (Oral)   Ht 6\' 3"  (1.905 m)   Wt 206 lb 3.2 oz (93.5 kg)   BMI 25.77 kg/m  General:   Alert and oriented. Pleasant and cooperative. Well-nourished and well-developed.  Eyes:  Without icterus, sclera clear and conjunctiva pink.  Ears:  Normal auditory acuity. Cardiovascular:  S1, S2 present without murmurs appreciated. Extremities without clubbing or edema. Respiratory:  Clear to auscultation bilaterally. No wheezes, rales, or rhonchi. No distress.  Gastrointestinal:  +BS, soft, non-tender and non-distended. No HSM noted. No guarding or rebound. No masses appreciated.  Rectal:  Deferred  Musculoskalatal:  Symmetrical without gross deformities. Skin:  Intact without significant lesions or rashes. Neurologic:  Alert and oriented x4;  grossly normal neurologically. Psych:  Alert and cooperative. Normal mood and affect. Heme/Lymph/Immune: No excessive bruising noted.    07/13/2017 11:41 AM   Disclaimer: This note was dictated with voice recognition software. Similar sounding words can inadvertently be transcribed and may not be corrected upon review.

## 2017-07-13 NOTE — Progress Notes (Signed)
CC'D TO PCP °

## 2017-10-06 ENCOUNTER — Telehealth: Payer: Self-pay | Admitting: Radiology

## 2017-10-06 ENCOUNTER — Encounter: Payer: Self-pay | Admitting: Orthopedic Surgery

## 2017-10-06 ENCOUNTER — Other Ambulatory Visit: Payer: Self-pay | Admitting: Family Medicine

## 2017-10-06 ENCOUNTER — Ambulatory Visit: Payer: Medicare Other | Admitting: Orthopedic Surgery

## 2017-10-06 ENCOUNTER — Ambulatory Visit (INDEPENDENT_AMBULATORY_CARE_PROVIDER_SITE_OTHER): Payer: Medicare Other

## 2017-10-06 VITALS — BP 129/75 | HR 59 | Ht 75.0 in | Wt 205.0 lb

## 2017-10-06 DIAGNOSIS — G8929 Other chronic pain: Secondary | ICD-10-CM

## 2017-10-06 DIAGNOSIS — M25562 Pain in left knee: Secondary | ICD-10-CM

## 2017-10-06 DIAGNOSIS — M25561 Pain in right knee: Secondary | ICD-10-CM

## 2017-10-06 NOTE — Progress Notes (Signed)
Progress Note   Patient ID: Matthew Adkins, male   DOB: [birth year], 67 y.o.   MRN: [id number]   Chief Complaint  Patient presents with  . Knee Pain    bilateral     HPI The patient presents for evaluation of bilateral knee pain  Duration ongoing several years Quality dull aching pain Severity mild Associated with giving way and catching and locking left knee right knee seems okay  Review of Systems  Constitutional: Negative for chills and fever.  Musculoskeletal: Positive for joint pain.  Neurological: Negative for tingling.   Current Meds  Medication Sig  . acetaminophen (TYLENOL) 500 MG tablet Take 1,000 mg by mouth every 6 (six) hours as needed for mild pain.  Marland Kitchen aspirin 81 MG tablet Take 81 mg by mouth daily.  Marland Kitchen FLUoxetine (PROZAC) 20 MG capsule Take 1 capsule (20 mg total) by mouth daily.  . Multiple Vitamins-Minerals (ONE DAILY MENS 50+ MULTIVIT PO) Take 1 tablet by mouth daily.  Marland Kitchen omeprazole (PRILOSEC) 20 MG capsule 1 PO 30 MINS PRIOR TO BREAKFAST.    Past Medical History:  Diagnosis Date  . Bladder cancer ([place]) 2000  . Depression   . Nicotine addiction   . Prostate cancer ([place]) 07/06/2014  . Seasonal allergies      Allergies  Allergen Reactions  . Bee Venom   . Codeine Nausea And Vomiting     BP 129/75   Pulse (!) 59   Ht 6\' 3"  (1.905 m)   Wt 205 lb (93 kg)   BMI 25.62 kg/m    Physical Exam General appearance normal Oriented x3 normal Mood pleasant affect normal Gait relatively normal  Right Knee Exam   Muscle Strength  The patient has normal right knee strength.  Tenderness  The patient is experiencing no tenderness.   Range of Motion  Extension: normal  Flexion:  120 normal   Tests  McMurray:  Medial - negative Lateral - negative Varus: negative Valgus: negative Drawer:  Anterior - negative    Posterior - negative  Other  Erythema: absent Scars: absent Sensation: normal Pulse: present Swelling: none   Left Knee Exam    Muscle Strength  The patient has normal left knee strength.  Tenderness  The patient is experiencing no tenderness.   Range of Motion  Extension: normal  Flexion:  120 normal   Tests  McMurray:  Medial - negative Lateral - negative Varus: negative Valgus: negative Drawer:  Anterior - negative     Posterior - negative  Other  Erythema: absent Scars: absent Sensation: normal Pulse: present Swelling: none       MEDICAL DECISION MAKING   Imaging:  X-rays both knees show severe arthritis bilaterally see report   Encounter Diagnosis  Name Primary?  . Chronic pain of both knees Yes     PLAN: (RX., injection, surgery,frx,mri/ct, XR 2 body ares) Continue present management of bracing  Patient is a candidate for Orthovisc or Synvisc injection  No orders of the defined types were placed in this encounter.  3:35 PM 10/06/2017

## 2017-10-06 NOTE — Telephone Encounter (Signed)
[employer] for patient.

## 2017-10-07 NOTE — Telephone Encounter (Signed)
Applied for [employer]

## 2017-10-11 ENCOUNTER — Telehealth: Payer: Self-pay | Admitting: Radiology

## 2017-10-11 NOTE — Telephone Encounter (Signed)
Monovisc covered as buy and bill with a $50 copay I have sent email to Autumn Patty to see if he can get the meds for [place] as buy and bill  Will send message to scheduling when meds arrive.

## 2017-10-12 NOTE — Telephone Encounter (Signed)
I spoke to [name], he is going to get the meds to [place] and double check the benefits inquiry.

## 2017-10-18 NOTE — Telephone Encounter (Signed)
Will ship today

## 2017-10-19 NOTE — Telephone Encounter (Signed)
Have gotten the Monovisc in I called to make appointment and let him know.

## 2017-11-01 ENCOUNTER — Ambulatory Visit: Payer: Medicare Other | Admitting: Orthopedic Surgery

## 2017-11-01 VITALS — Ht 75.0 in | Wt 207.0 lb

## 2017-11-01 DIAGNOSIS — M170 Bilateral primary osteoarthritis of knee: Secondary | ICD-10-CM

## 2017-11-01 NOTE — Progress Notes (Signed)
Chief Complaint  Patient presents with  . Injections    Bilat knees Monovisc lot [id number]   .Procedure note for injection of MONOVISC  Diagnosis osteoarthritis of the knee  Verbal consent was obtained to inject the knee with Orthovisc. Timeout was completed to confirm the injection site as the LEFT  Knee  Ethyl chloride spray was used for anesthesia Alcohol was used to prep the skin. The infrapatellar lateral portal was used as an injection site and 1 vial of Orthovisc was injected into the knee   No complications were noted  REPEATED RIGHT KNEE   RETURN 2 MONTHS

## 2017-11-23 ENCOUNTER — Ambulatory Visit (INDEPENDENT_AMBULATORY_CARE_PROVIDER_SITE_OTHER): Payer: Self-pay | Admitting: Family Medicine

## 2017-11-23 ENCOUNTER — Encounter: Payer: Self-pay | Admitting: Family Medicine

## 2017-11-23 VITALS — BP 155/88 | HR 64 | Resp 12 | Ht 75.0 in | Wt 208.0 lb

## 2017-11-23 DIAGNOSIS — F321 Major depressive disorder, single episode, moderate: Secondary | ICD-10-CM

## 2017-11-24 ENCOUNTER — Encounter: Payer: Self-pay | Admitting: Family Medicine

## 2017-11-24 ENCOUNTER — Ambulatory Visit (INDEPENDENT_AMBULATORY_CARE_PROVIDER_SITE_OTHER): Payer: Medicare Other | Admitting: Family Medicine

## 2017-11-24 VITALS — BP 160/90 | HR 84 | Resp 12 | Ht 75.0 in | Wt 206.0 lb

## 2017-11-24 DIAGNOSIS — I10 Essential (primary) hypertension: Secondary | ICD-10-CM

## 2017-11-24 DIAGNOSIS — R7303 Prediabetes: Secondary | ICD-10-CM

## 2017-11-24 DIAGNOSIS — J309 Allergic rhinitis, unspecified: Secondary | ICD-10-CM

## 2017-11-24 DIAGNOSIS — Z0000 Encounter for general adult medical examination without abnormal findings: Secondary | ICD-10-CM

## 2017-11-24 DIAGNOSIS — F17210 Nicotine dependence, cigarettes, uncomplicated: Secondary | ICD-10-CM | POA: Diagnosis not present

## 2017-11-24 DIAGNOSIS — F17200 Nicotine dependence, unspecified, uncomplicated: Secondary | ICD-10-CM

## 2017-11-24 MED ORDER — BUPROPION HCL ER (SR) 150 MG PO TB12: 150.0000 mg | ORAL_TABLET | Freq: Two times a day (BID) | ORAL | 3 refills | Status: DC

## 2017-11-24 MED ORDER — AMLODIPINE BESYLATE 5 MG PO TABS: 5.0000 mg | ORAL_TABLET | Freq: Every day | ORAL | 3 refills | Status: DC

## 2017-11-24 MED ORDER — FLUTICASONE PROPIONATE 50 MCG/ACT NA SUSP: 2.0000 | Freq: Every day | NASAL | 6 refills | Status: DC

## 2017-11-24 NOTE — Progress Notes (Signed)
   Matthew Adkins     MRN: [id number]      DOB: July 13, 1950   HPI: Patient is in for annual physical exam. Blood pressure is elevateds , he is started on medication. He denies chest pain, plpaitation, PND or orthopnea. C/o frontal pressure and increased nasal congestion and drainage which is clear. Though under a lot of stress  Caring for his wife and sister wit dementia, he copes well overall, major issue is ongoing nicotine use for stress relief, he wants to quit mmunization is reviewed , and  Is up to date    PE; BP (!) 160/90   Pulse 84   Resp 12   Ht 6\' 3"  (1.905 m)   Wt 206 lb (93.4 kg)   SpO2 98% Comment: room air  BMI 25.75 kg/m   Pleasant male, alert and oriented x 3, in no cardio-pulmonary distress. Afebrile. HEENT No facial trauma or asymetry. Sinuses non tender. EOMI, Nasal mucosa edematous and red ,c lear drainage External ears normal, tympanic membranes clear. Oropharynx moist, no exudate. Neck: supple, no adenopathy,JVD or thyromegaly.No bruits.  Chest: Clear to ascultation bilaterally.No crackles or wheezes.decreased air entry bilaterally Non tender to palpation  Breast: No asymetry,no masses. No nipple discharge or inversion. No axillary or supraclavicular adenopathy  Cardiovascular system; Heart sounds normal,  S1 and  S2 ,no S3.  No murmur, or thrill. Apical beat not displaced Peripheral pulses normal.  Abdomen: Soft, non tender, no organomegaly or masses. No bruits. Bowel sounds normal. No guarding, tenderness or rebound.    Musculoskeletal exam: Decreased  ROM of spine, No deformity ,swelling or crepitus noted. No muscle wasting or atrophy.   Neurologic: Cranial nerves 2 to 12 intact. Power, tone ,sensation  normal throughout. No disturbance in gait. No tremor.  Skin: Intact, no ulceration, erythema , scaling or rash noted. Pigmentation normal throughout  Psych; Normal mood slightly anxious and at times, when thinking of the  health of his wife, he becomes slightly tearful. Judgement and concentration normal   Assessment & Plan:  Annual physical exam Annual exam as documented. Counseling done  re healthy lifestyle involving commitment to 150 minutes exercise per week, heart healthy diet, and attaining healthy weight.The importance of adequate sleep also discussed. Changes in health habits are decided on by the patient with goals and time frames  set for achieving them. Immunization and cancer screening needs are specifically addressed at this visit.   NICOTINE ADDICTION Asked:confirms currently smokes cigarettes Assess: willing to quit and  cutting back Advise: needs to QUIT to reduce risk of cancer, cardio and cerebrovascular disease, already has had b;ladder and prostate cancer, has tubular adenoma colon and emphysema Assist: counseled for 11 minutes and literature provided, zyban prescribed and mode of action discussed, quit date aim is 2 weeks after starting Arrange: follow up in 3 months   Allergic rhinitis Uncontrolled , pt to start flonase daily

## 2017-11-24 NOTE — Assessment & Plan Note (Signed)
Uncontrolled , pt to start flonase daily

## 2017-11-24 NOTE — Assessment & Plan Note (Signed)
Asked:confirms currently smokes cigarettes Assess: willing to quit and  cutting back Advise: needs to QUIT to reduce risk of cancer, cardio and cerebrovascular disease, already has had b;ladder and prostate cancer, has tubular adenoma colon and emphysema Assist: counseled for 11 minutes and literature provided, zyban prescribed and mode of action discussed, quit date aim is 2 weeks after starting Arrange: follow up in 3 months

## 2017-11-24 NOTE — Assessment & Plan Note (Signed)
Annual exam as documented. Counseling done  re healthy lifestyle involving commitment to 150 minutes exercise per week, heart healthy diet, and attaining healthy weight.The importance of adequate sleep also discussed. Changes in health habits are decided on by the patient with goals and time frames  set for achieving them. Immunization and cancer screening needs are specifically addressed at this visit. 

## 2017-11-24 NOTE — Patient Instructions (Signed)
F/U in early January, call if you need me sooner  HBA1C, chem 7 and EGFr today at hospital  New for blood pressure is amlodipine 5 mg one daily  New for allergies is flonase spray  New too help with QUITTING smoking, plan to stop 2 weeks after you start is Wellbutrin twice daily , current is 10/day  Thank you  for choosing [facility]. We consider it a privelige to serve you.  Delivering excellent health care in a caring and  compassionate way is our goal.  Partnering with you,  so that together we can achieve this goal is our strategy.

## 2017-11-27 NOTE — Progress Notes (Signed)
Patient rescheduled as he had to leave before I was  Able to examine him, he returned the following day. This visit iscancelled

## 2017-11-29 ENCOUNTER — Other Ambulatory Visit (HOSPITAL_COMMUNITY)
Admission: RE | Admit: 2017-11-29 | Discharge: 2017-11-29 | Disposition: A | Discharge status: Home / Self Care | Payer: Medicare Other | Source: Ambulatory Visit | Attending: Family Medicine | Admitting: Family Medicine

## 2017-11-29 DIAGNOSIS — I10 Essential (primary) hypertension: Secondary | ICD-10-CM | POA: Insufficient documentation

## 2017-11-29 LAB — BASIC METABOLIC PANEL
ANION GAP: 7 (ref 5–15)
BUN: 14 mg/dL (ref 8–23)
CALCIUM: 8.9 mg/dL (ref 8.9–10.3)
CO2: 26 mmol/L (ref 22–32)
CREATININE: 0.96 mg/dL (ref 0.61–1.24)
Chloride: 105 mmol/L (ref 98–111)
GFR calc Af Amer: >60 mL/min (ref >60)
GLUCOSE: 105 mg/dL — AB (ref 70–99)
Potassium: 3.8 mmol/L (ref 3.5–5.1)
Sodium: 138 mmol/L (ref 135–145)

## 2018-01-03 ENCOUNTER — Ambulatory Visit: Payer: Medicare Other | Admitting: Orthopedic Surgery

## 2018-01-10 ENCOUNTER — Ambulatory Visit: Payer: Medicare Other | Admitting: Orthopedic Surgery

## 2018-01-13 ENCOUNTER — Ambulatory Visit (INDEPENDENT_AMBULATORY_CARE_PROVIDER_SITE_OTHER): Payer: Medicare Other | Admitting: Family Medicine

## 2018-01-13 ENCOUNTER — Encounter: Payer: Self-pay | Admitting: Family Medicine

## 2018-01-13 VITALS — BP 120/70 | HR 66 | Resp 12 | Ht 75.0 in | Wt 209.1 lb

## 2018-01-13 DIAGNOSIS — F419 Anxiety disorder, unspecified: Secondary | ICD-10-CM

## 2018-01-13 DIAGNOSIS — I10 Essential (primary) hypertension: Secondary | ICD-10-CM

## 2018-01-13 DIAGNOSIS — R7301 Impaired fasting glucose: Secondary | ICD-10-CM

## 2018-01-13 DIAGNOSIS — F17200 Nicotine dependence, unspecified, uncomplicated: Secondary | ICD-10-CM

## 2018-01-13 DIAGNOSIS — F320 Major depressive disorder, single episode, mild: Secondary | ICD-10-CM

## 2018-01-13 DIAGNOSIS — F17210 Nicotine dependence, cigarettes, uncomplicated: Secondary | ICD-10-CM

## 2018-01-13 DIAGNOSIS — E785 Hyperlipidemia, unspecified: Secondary | ICD-10-CM

## 2018-01-13 MED ORDER — AMLODIPINE BESYLATE 5 MG PO TABS: 5.0000 mg | ORAL_TABLET | Freq: Every day | ORAL | 3 refills | Status: DC

## 2018-01-13 MED ORDER — NICOTINE 7 MG/24HR TD PT24: 7.0000 mg | MEDICATED_PATCH | Freq: Every day | TRANSDERMAL | 1 refills | Status: DC

## 2018-01-13 NOTE — Patient Instructions (Signed)
Wellness with nurse  Due in March, please schedule;e  Annual physical exam with MD in June  Fasting lipid, cmp and EGFr and HBA1C this week  (hospital draw)   BP excellent  Stop smoking on Feb 1, additional support with nicorette gum OR patches will help

## 2018-01-16 ENCOUNTER — Encounter: Payer: Self-pay | Admitting: Family Medicine

## 2018-01-16 DIAGNOSIS — I10 Essential (primary) hypertension: Secondary | ICD-10-CM

## 2018-01-16 HISTORY — DX: Essential (primary) hypertension: I10

## 2018-01-16 NOTE — Assessment & Plan Note (Signed)
Controlled, no change in medication  

## 2018-01-16 NOTE — Assessment & Plan Note (Signed)
Controlled, no change in medication DASH diet and commitment to daily physical activity for a minimum of 30 minutes discussed and encouraged, as a part of hypertension management. The importance of attaining a healthy weight is also discussed.  BP/Weight 01/13/2018 11/24/2017 11/23/2017 11/01/2017 10/06/2017 07/13/2017 6/94/8546  Systolic BP 270 350 093 - 818 299 371  Diastolic BP 70 90 88 - 75 83 80  Wt. (Lbs) 209.12 206 208.04 207 205 206.2 208.08  BMI 26.14 25.75 26 25.87 25.62 25.77 26.01

## 2018-01-16 NOTE — Assessment & Plan Note (Signed)
Asked:confirms currently smokes cigarettes Assess: Ready to QUIT Advise: needs to QUIT to reduce risk of cancer, cardio and cerebrovascular disease Assist: counseled for 5 minutes and literature provided Arrange: follow up in 3 months

## 2018-01-16 NOTE — Progress Notes (Signed)
   Matthew Adkins     MRN: [id number]      DOB: 05-27-1950   HPI [name] is here for follow up and re-evaluation of chronic medical conditions, medication management and review of any available recent lab and radiology data.  Preventive health is updated, specifically  Cancer screening and Immunization.   Questions or concerns regarding consultations or procedures which the PT has had in the interim are  addressed. The PT denies any adverse reactions to current medications since the last visit. Tolerating BP medication with no adverse s/e There are no new concerns.  There are no specific complaints   ROS Denies recent fever or chills. Denies sinus pressure, nasal congestion, ear pain or sore throat. Denies chest congestion, productive cough or wheezing. Denies chest pains, palpitations and leg swelling Denies abdominal pain, nausea, vomiting,diarrhea or constipation.   Denies dysuria, frequency, hesitancy or incontinence. Denies joint pain, swelling and limitation in mobility. Denies headaches, seizures, numbness, or tingling. Denies uncontrolled  depression, anxiety or insomnia. Denies skin break down or rash.   PE  BP 120/70   Pulse 66   Resp 12   Ht 6\' 3"  (1.905 m)   Wt 209 lb 1.9 oz (94.9 kg)   SpO2 96% Comment: room air  BMI 26.14 kg/m   Patient alert and oriented and in no cardiopulmonary distress.  HEENT: No facial asymmetry, EOMI,   oropharynx pink and moist.  Neck supple no JVD, no mass.  Chest: Clear to auscultation bilaterally.  CVS: S1, S2 no murmurs, no S3.Regular rate.  ABD: Soft non tender.   Ext: No edema  MS: Adequate ROM spine, shoulders, hips and knees.  Skin: Intact, no ulcerations or rash noted.  Psych: Good eye contact, normal affect. Memory intact not anxious or depressed appearing.  CNS: CN 2-12 intact, power,  normal throughout.no focal deficits noted.   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 The importance of attaining a healthy weight is also discussed.  BP/Weight 01/13/2018 11/24/2017 11/23/2017 11/01/2017 10/06/2017 07/13/2017 1/91/4782  Systolic BP 956 213 086 - 578 469 629  Diastolic BP 70 90 88 - 75 83 80  Wt. (Lbs) 209.12 206 208.04 207 205 206.2 208.08  BMI 26.14 25.75 26 25.87 25.62 25.77 26.01

## 2018-01-21 ENCOUNTER — Other Ambulatory Visit (HOSPITAL_COMMUNITY)
Admission: RE | Admit: 2018-01-21 | Discharge: 2018-01-21 | Disposition: A | Discharge status: Home / Self Care | Payer: Medicare Other | Source: Ambulatory Visit | Attending: Family Medicine | Admitting: Family Medicine

## 2018-01-21 DIAGNOSIS — E785 Hyperlipidemia, unspecified: Secondary | ICD-10-CM | POA: Diagnosis present

## 2018-01-21 LAB — LIPID PANEL
CHOL/HDL RATIO: 3.6 ratio
CHOLESTEROL: 192 mg/dL (ref 0–200)
HDL: 54 mg/dL (ref >40)
LDL CALC: 129 mg/dL — AB (ref 0–99)
Triglycerides: 45 mg/dL (ref <150)
VLDL: 9 mg/dL (ref 0–40)

## 2018-01-21 LAB — COMPREHENSIVE METABOLIC PANEL
ALBUMIN: 3.8 g/dL (ref 3.5–5.0)
ALT: 18 U/L (ref 0–44)
ANION GAP: 8 (ref 5–15)
AST: 19 U/L (ref 15–41)
Alkaline Phosphatase: 54 U/L (ref 38–126)
BILIRUBIN TOTAL: 0.5 mg/dL (ref 0.3–1.2)
BUN: 14 mg/dL (ref 8–23)
CHLORIDE: 105 mmol/L (ref 98–111)
CO2: 26 mmol/L (ref 22–32)
Calcium: 8.5 mg/dL — ABNORMAL LOW (ref 8.9–10.3)
Creatinine, Ser: 0.98 mg/dL (ref 0.61–1.24)
GFR calc Af Amer: >60 mL/min (ref >60)
Glucose, Bld: 130 mg/dL — ABNORMAL HIGH (ref 70–99)
POTASSIUM: 3.5 mmol/L (ref 3.5–5.1)
Sodium: 139 mmol/L (ref 135–145)
TOTAL PROTEIN: 7.1 g/dL (ref 6.5–8.1)

## 2018-01-21 LAB — HEMOGLOBIN A1C
HEMOGLOBIN A1C: 6.5 % — AB (ref 4.8–5.6)
Mean Plasma Glucose: 139.85 mg/dL

## 2018-01-24 ENCOUNTER — Telehealth: Payer: Self-pay | Admitting: *Deleted

## 2018-01-24 NOTE — Telephone Encounter (Signed)
See previous note

## 2018-01-24 NOTE — Telephone Encounter (Signed)
Pt called regarding his lab results.

## 2018-01-31 ENCOUNTER — Ambulatory Visit: Payer: Medicare Other | Admitting: Orthopedic Surgery

## 2018-01-31 ENCOUNTER — Encounter: Payer: Self-pay | Admitting: Orthopedic Surgery

## 2018-01-31 VITALS — BP 145/79 | HR 65 | Ht 75.0 in | Wt 214.0 lb

## 2018-01-31 DIAGNOSIS — M170 Bilateral primary osteoarthritis of knee: Secondary | ICD-10-CM | POA: Diagnosis not present

## 2018-01-31 NOTE — Progress Notes (Signed)
This is a follow-up visit 68 year old male bilateral arthritis had his Monovisc injections in both knees in October he is doing well he says he cannot really tell if they made a big difference he says he does not have a lot of pain but occasionally will have a clicking clunking phenomena but it does not last long.  He has more discomfort with cold and damp weather but otherwise he is doing very well  He can get another round of injections of his knee started to bother him again after 47months from his initial injection  Encounter Diagnosis  Name Primary?  . Primary osteoarthritis of both knees Yes

## 2018-02-17 ENCOUNTER — Other Ambulatory Visit: Payer: Self-pay | Admitting: Gastroenterology

## 2018-02-17 ENCOUNTER — Encounter: Payer: Self-pay | Admitting: Family Medicine

## 2018-02-17 ENCOUNTER — Ambulatory Visit (INDEPENDENT_AMBULATORY_CARE_PROVIDER_SITE_OTHER): Payer: Medicare Other | Admitting: Family Medicine

## 2018-02-17 ENCOUNTER — Ambulatory Visit (HOSPITAL_COMMUNITY)
Admission: RE | Admit: 2018-02-17 | Discharge: 2018-02-17 | Disposition: A | Discharge status: Home / Self Care | Payer: Medicare Other | Source: Ambulatory Visit | Attending: Family Medicine | Admitting: Family Medicine

## 2018-02-17 VITALS — BP 130/78 | HR 64 | Resp 15 | Ht 75.0 in | Wt 211.0 lb

## 2018-02-17 DIAGNOSIS — I10 Essential (primary) hypertension: Secondary | ICD-10-CM

## 2018-02-17 DIAGNOSIS — F17210 Nicotine dependence, cigarettes, uncomplicated: Secondary | ICD-10-CM

## 2018-02-17 DIAGNOSIS — R51 Headache: Secondary | ICD-10-CM | POA: Insufficient documentation

## 2018-02-17 DIAGNOSIS — F320 Major depressive disorder, single episode, mild: Secondary | ICD-10-CM

## 2018-02-17 DIAGNOSIS — F17200 Nicotine dependence, unspecified, uncomplicated: Secondary | ICD-10-CM

## 2018-02-17 DIAGNOSIS — G4489 Other headache syndrome: Secondary | ICD-10-CM | POA: Diagnosis not present

## 2018-02-17 DIAGNOSIS — F419 Anxiety disorder, unspecified: Secondary | ICD-10-CM

## 2018-02-17 DIAGNOSIS — R519 Headache, unspecified: Secondary | ICD-10-CM | POA: Insufficient documentation

## 2018-02-17 MED ORDER — IBUPROFEN 800 MG PO TABS: 800.0000 mg | ORAL_TABLET | Freq: Three times a day (TID) | ORAL | 0 refills | Status: DC | PRN

## 2018-02-17 MED ORDER — PREDNISONE 10 MG PO TABS: 10.0000 mg | ORAL_TABLET | Freq: Two times a day (BID) | ORAL | 0 refills | Status: DC

## 2018-02-17 MED ORDER — CYCLOBENZAPRINE HCL 10 MG PO TABS: 10.0000 mg | ORAL_TABLET | Freq: Every day | ORAL | 0 refills | Status: DC

## 2018-02-17 NOTE — Patient Instructions (Addendum)
You are treated for headache , please call in the next 1 to 5 weeks if t you feel that you are not where you need to be, otherwise , still headaches!  Pls get x ray of your neck today, also medication is prescribed  I believe the pain is from mental stress as well as likely may have arthritis in neck with nerve irritation  Thank you  for choosing [facility]. We consider it a privelige to serve you.  Delivering excellent health care in a caring and  compassionate way is our goal.  Partnering with you,  so that together we can achieve this goal is our strategy.

## 2018-02-17 NOTE — Assessment & Plan Note (Addendum)
Asked:confirms currently smokes 2  Cigarettes/ day Assess:willing to quit and  cutting back Advise: needs to QUIT to reduce risk of cancer, cardio and cerebrovascular disease Assist: counseled for 5 minutes and literature provided Arrange: follow up in 3 months'

## 2018-02-17 NOTE — Progress Notes (Signed)
   Matthew Adkins     MRN: [id number]      DOB: February 17, 1950   HPI Matthew Adkins is here with a 1 week h/o throbbing headaches on avg every 20 minutes . Localized left posterior skull, has awakened  Pt from his sleep last night after turning his head to the left. Thinks may be stress related, this has happened a few weeks ago also but resolved, now returned Denies aura, nausea , vomit , blurry vision, localized weakness or numbness ROS Denies recent fever or chills. Denies sinus pressure, nasal congestion, ear pain or sore throat. Denies chest congestion, productive cough or wheezing. Denies chest pains, palpitations and leg swelling Denies abdominal pain, nausea, vomiting,diarrhea or constipation.   Denies dysuria, frequency, hesitancy or incontinence. Denies joint pain, swelling and limitation in mobility. Denies skin break down or rash.   PE  BP 130/78   Pulse 64   Resp 15   Ht 6\' 3"  (1.905 m)   Wt 211 lb (95.7 kg)   SpO2 96%   BMI 26.37 kg/m   Patient alert and oriented and in no cardiopulmonary distress.  HEENT: No facial asymmetry, EOMI,   oropharynx pink and moist.  Neck supple no JVD, no mass.Fundoscopy: no hemmorage , vessels appear healthy  Chest: Clear to auscultation bilaterally.  CVS: S1, S2 no murmurs, no S3.Regular rate.  ABD: Soft non tender.   Ext: No edema  MS: Adequate ROM spine, shoulders, hips and knees.  Skin: Intact, no ulcerations or rash noted.  Psych: Good eye contact, normal affect. Memory intact not anxious or depressed appearing.  CNS: CN 2-12 intact, power,  normal throughout.no focal deficits noted.   Assessment & Plan  NICOTINE ADDICTION Asked:confirms currently smokes 2  Cigarettes/ day Assess:willing to quit and  cutting back Advise: needs to QUIT to reduce risk of cancer, cardio and cerebrovascular disease Assist: counseled for 5 minutes and literature provided Arrange: follow up in 3 months'   Daily headache New daily  headache,  toradol ibuprofen and bedtime. DG c spine   Headache New headaches, no red flags by history and neurologic exam is normal. 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 Short course of anti inflammatories and muscle relaxant prescribed Pt to ge X ray of neck and will call back in next several weeks if headache persists for scan of brain  Essential hypertension Controlled, no change in medication DASH diet and commitment to daily physical activity for a minimum of 30 minutes discussed and encouraged, as a part of hypertension management. The importance of attaining a healthy weight is also discussed.  BP/Weight 02/17/2018 01/31/2018 01/13/2018 11/24/2017 11/23/2017 11/01/2017 40/09/8117  Systolic BP 147 829 562 130 865 - 784  Diastolic BP 78 79 70 90 88 - 75  Wt. 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

## 2018-02-17 NOTE — Assessment & Plan Note (Signed)
New daily headache,  toradol ibuprofen and bedtime. DG c spine

## 2018-02-18 ENCOUNTER — Encounter: Payer: Self-pay | Admitting: Family Medicine

## 2018-02-18 NOTE — Assessment & Plan Note (Signed)
Controlled, no change in medication DASH diet and commitment to daily physical activity for a minimum of 30 minutes discussed and encouraged, as a part of hypertension management. The importance of attaining a healthy weight is also discussed.  BP/Weight 02/17/2018 01/31/2018 01/13/2018 11/24/2017 11/23/2017 11/01/2017 62/08/3660  Systolic BP 947 654 650 354 656 - 812  Diastolic BP 78 79 70 90 88 - 75  Wt. (Lbs) 211 214 209.12 206 208.04 207 205  BMI 26.37 26.75 26.14 25.75 26 25.87 25.62

## 2018-02-18 NOTE — Assessment & Plan Note (Signed)
New headaches, no red flags by history and neurologic exam is normal. 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 Short course of anti inflammatories and muscle relaxant prescribed Pt to ge X ray of neck and will call back in next several weeks if headache persists for scan of brain

## 2018-02-18 NOTE — Assessment & Plan Note (Signed)
Controlled, no change in medication  

## 2018-02-21 ENCOUNTER — Telehealth: Payer: Self-pay | Admitting: Family Medicine

## 2018-02-21 NOTE — Telephone Encounter (Signed)
Advised patient of recent xray results with verbal understanding.

## 2018-02-21 NOTE — Telephone Encounter (Signed)
PT LVM returning your call, xray results

## 2018-03-18 ENCOUNTER — Ambulatory Visit: Payer: Medicare Other

## 2018-03-21 ENCOUNTER — Ambulatory Visit: Payer: Medicare Other

## 2018-03-22 ENCOUNTER — Other Ambulatory Visit: Payer: Self-pay

## 2018-03-22 ENCOUNTER — Telehealth: Payer: Self-pay

## 2018-03-22 ENCOUNTER — Encounter: Payer: Self-pay | Admitting: Family Medicine

## 2018-03-22 ENCOUNTER — Ambulatory Visit (INDEPENDENT_AMBULATORY_CARE_PROVIDER_SITE_OTHER): Payer: Medicare Other | Admitting: Family Medicine

## 2018-03-22 VITALS — BP 130/83 | Temp 98.2°F | Ht 75.0 in | Wt 213.1 lb

## 2018-03-22 DIAGNOSIS — Z0000 Encounter for general adult medical examination without abnormal findings: Secondary | ICD-10-CM

## 2018-03-22 DIAGNOSIS — Z720 Tobacco use: Secondary | ICD-10-CM

## 2018-03-22 NOTE — Telephone Encounter (Signed)
Matthew Adkins, CMA  

## 2018-03-22 NOTE — Patient Instructions (Addendum)
Thank you for coming into the office today. I appreciate the opportunity to provide you with the care for your health and wellness.   Remember to change the batteries in your smoke detectors when changing your clock times in the spring and fall.  Use your seat belt every time you are in a car, and please drive safely and not be distracted with cell phones and texting while driving.   MEDICARE PREVENTATIVE SERVICES AND PERSONALIZED PLAN for  Matthew Adkins March 22, 2018   GENERAL RECOMMENDATIONS FOR GOOD HEALTH:  Supplements:  . Take a daily baby Aspirin 81mg  at bedtime for heart health unless you have a history of gastrointestinal bleed, allergy to aspirin, or are already taking higher dose Aspirin or other antiplatelet or blood thinner medication.   . Consume 1200 mg of Calcium daily through dietary calcium or supplement if you are male age 66 or older, or men 40 and older.   Men aged 46-70 should consume 1000 mg of Calcium daily. . Take 600 IU of Vitamin D daily.  Take 800 IU of Calcium daily if you are older than age 27.  . Take a general multivitamin daily.   Healthy diet: Eat a variety of foods, including fruits, vegetables, vegetable protein such as beans, lentils, tofu, and grains, such as rice.  Limit meat or animal protein, but if you eat meat, choose leans cuts such as chicken, fish, or [place].  Drink plenty of water daily.  Decrease saturated fat in the diet, avoid lots of red meat, processed foods, sweets, fast foods, and fried foods.  Limit salt and caffeine intake.  Exercise: Aerobic exercise helps maintain good heart health. Weight bearing exercise helps keep bones and muscles working strong.  We recommend at least 30-40 minutes of exercise most days of the week.   Fall prevention: Falls are the leading cause of injuries, accidents, and accidental deaths in people over the age of 88. Falling is a real threat to your ability to live on your own.  Causes include poor  eyesight or poor hearing, illness, poor lighting, throw rugs, clutter in your home, and medication side effects causing dizziness or balance problems.  Such medications can include medications for depression, sleep problems, high blood pressure, diabetes, and heart conditions.   PREVENTION  Be sure your home is as safe as possible. Here are some tips:  Wear shoes with non-skid soles (not house slippers).   Be sure your home and outside area are well lit.   Use night lights throughout your house, including hallways and stairways.   Remove clutter and clean up spills on floors and walkways.   Remove throw rugs or fasten them to the floor with carpet tape. Tack down carpet edges.   Do not place electrical cords across pathways.   Install grab bars in your bathtub, shower, and toilet area. Towel bars should not be used as a grab bar.   Install handrails on both sides of stairways.   Do not climb on stools or stepladders. Get someone else to help with jobs that require climbing.   Do not wax your floors at all, or use a non-skid wax.   Repair uneven or unsafe sidewalks, walkways or stairs.   Keep frequently used items within reach.   Be aware of pets so you do not trip.  Get regular check-ups from your doctor, and take good care of yourself:  Have your eyes checked every year for vision changes, cataracts, glaucoma, and other  eye problems. Wear eyeglasses as directed.   Have your hearing checked every 2 years, or anytime you or others think that you cannot hear well. Use hearing aids as directed.   See your caregiver if you have foot pain or corns. Sore feet can contribute to falls.   Let your caregiver know if a medicine is making you feel dizzy or making you lose your balance.   Use a cane, walker, or wheelchair as directed. Use walker or wheelchair brakes when getting in and out.   When you get up from bed, sit on the side of the bed for 1 to 2 minutes before you stand up. This  will give your blood pressure time to adjust, and you will feel less dizzy.   If you need to go to the bathroom often, consider using a bedside commode.  Disease prevention:  If you smoke or chew tobacco, find out from your caregiver how to quit. It can literally save your life, no matter how long you have been a tobacco user. If you do not use tobacco, never begin. Medicare does cover some smoking cessation counseling.  Maintain a healthy diet and normal weight. Increased weight leads to problems with blood pressure and diabetes. We check your height, weight, and BMI as part of your yearly visit.  The Body Mass Index or BMI is a way of measuring how much of your body is fat. Having a BMI above 27 increases the risk of heart disease, diabetes, hypertension, stroke and other problems related to obesity. Your caregiver can help determine your BMI and based on it develop an exercise and dietary program to help you achieve or maintain this important measurement at a healthful level.  High blood pressure causes heart and blood vessel problems.  Persistent high blood pressure should be treated with medicine if weight loss and exercise do not work.  We check your blood pressure as part of your yearly visit.  Avoid drinking alcohol in excess (more than two drinks per day).  Avoid use of street drugs. Do not share needles with anyone. Ask for professional help if you need assistance or instructions on stopping the use of alcohol, cigarettes, and/or drugs.  Brush your teeth twice a day with fluoride toothpaste, and floss once a day. Good oral hygiene prevents tooth decay and gum disease. The problems can be painful, unattractive, and can cause other health problems. Visit your dentist for a routine oral and dental checkup and preventive care every 6-12 months.   See your eye doctor yearly for routine screening for things like glaucoma.  Look at your skin regularly.  Use a mirror to look at your back.  Notify your caregivers of changes in moles, especially if there are changes in shapes, colors, a size larger than a pencil eraser, an irregular border, or development of new moles.  Safety:  Use seatbelts 100% of the time, whether driving or as a passenger.  Use safety devices such as hearing protection if you work in environments with loud noise or significant background noise.  Use safety glasses when doing any work that could send debris in to the eyes.  Use a helmet if you ride a bike or motorcycle.  Use appropriate safety gear for contact sports.  Talk to your caregiver about gun safety.  Use sunscreen with a SPF (or skin protection factor) of 15 or greater.  Lighter skinned people are at a greater risk of skin cancer. Don't forget to also wear sunglasses in  order to protect your eyes from too much damaging sunlight. Damaging sunlight can accelerate cataract formation.   If you have multiple sexual partners, or if you are not in a monogamous relationship, practice safe sex. Use condoms. Condoms are used to help reduce the spread of sexually transmitted infections (or STIs).  Consider an HIV test if you have never been tested.  Consider routine screening for STIs if you have multiple sexual partners.   Keep carbon monoxide and smoke detectors in your home functioning at all times. Change the batteries every 6 months or use a model that plugs into the wall or is hard wired in.   END OF LIFE PLANNING/ADVANCED DIRECTIVES Advance health-care planning is deciding the kind of care you want at the end of life. While alert competent adults are able to exercise their rights to make health care and financial decisions, problems arise when an individual becomes unconscious, incapacitated, or otherwise unable to communicate or make such decisions. Advance health care directives are the legal documents in which you give written instructions about your choices limited, aggressive or palliative care if, in the  future, you cannot speak for yourself.  Advanced directives include the following: [place] allows you to appoint someone to act as your health care agent to make health care decisions for you should it be determined by your health care provider that you are no longer able to make these decisions for yourself.  A Living Will is a legal document in which you can declare that under certain conditions you desire your life not be prolonged by extraordinary or artificial means during your last illness or when you are near death. We can provide you with sample advanced directives, you can get an attorney to prepare these for you, or you can visit [place] Secretary of State's website for additional information and resources at [email or web address]  Further, I recommend you have an attorney prepare a Will and Durable Power of Attorney if you haven't done so already.  Please get [place] a copy of your health care Advanced Directives.   PREVENTATIV E CARE RECOMMENDATIONS:  Cancer Screening: Most routine colon cancer screening begins at the age of 60.  Subsequent colonoscopies are performed either every 5-10 years for normal screening, or every 2-5 years for higher risks patients, up until age 26 years of age. Annual screening is done with easy to use take-home tests to check for hidden blood in the stool called hemoccult tests.  Sigmoidoscopy or colonoscopy can detect the earliest forms of colon cancer and is life saving. These tests use a small camera at the end of a tube to directly examine the colon.   Prostate cancer screening usually begins at age 60 years old, or younger age for those with higher risk.  Those at higher risk include African-Americans or having a family history of prostate cancer. There are two types of tests for prostate cancer - Prostate-specific antigen (PSA) testing. Recent studies raise questions about prostate cancer using PSA and you should discuss this  with your caregiver.  The other type of test is the digital rectal exam (in which your doctor's lubricated and gloved finger feels for enlargement of the prostate through the anus).  We routinely stop testing at age 42 years of age.  Osteoporosis Screening: Screening for osteoporosis usually begins at age 7 for women, and can be done as frequent as every 2 years.  However, women or men with higher risk of osteoporosis may be  screened earlier than age 96.  Osteoporosis or low bone mass is diminished bone strength from alterations in bone architecture leading to bone fragility and increased fracture risk.     Cardiovascular Screening: Fat and cholesterol leaves deposits in your arteries that can block them. This causes heart disease and vessel disease elsewhere in your body.  If your cholesterol is found to be high, or if you have heart disease or certain other medical conditions, then you may need to have your cholesterol monitored frequently and be treated with medication. Cardiovascular screening in the form of lab tests for cholesterol, HDL and triglycerides can be done every 5 years.  A screening electrocardiogram can be done as part of the Welcome to Medicare physical.  Diabetes Screening: Diabetes screening can be done at least every 3 years for those with risk factors,  or every 6-67months for prediabetic patients.  Screening includes fasting blood sugar test or glucose tolerance test.  Risk factors include hypertension, dyslipidemia, obesity, previously abnormal glucose tests, family history of diabetes, age 34 years or older, and history of gestations diabetes.   AAA (abdominal aortic aneurysm) Screening: Medicare allows for a one time ultrasound to screen for abdominal aortic aneurysm if done as a referral as part of the Welcome to Medicare exam.  Men eligible for this screening include those men between age 26-37 years of age who have smoked at least 100 cigarettes in his lifetime and/or has a  family history of AAA.   [place] YOUR HANDS WELL AND FREQUENTLY. AVOID TOUCHING YOUR FACE, UNLESS YOUR HANDS ARE FRESHLY WASHED.   GET FRESH AIR DAILY. STAY HYDRATED WITH WATER.   It was a pleasure to see you and I look forward to continuing to work together on your health and well-being. Please do not hesitate to call the office if you need care or have questions about your care.  Have a wonderful day and week.  With Gratitude,  Cherly Beach, DNP, AGNP-BC

## 2018-03-22 NOTE — Progress Notes (Signed)
Subjective:   Matthew Adkins is a 68 y.o. male who presents for Medicare Annual/Subsequent preventive examination.  Review of Systems:  Review of Systems  Constitutional: Negative for activity change, appetite change, chills and fever.  HENT: Negative.   Eyes: Negative.        Needs to set up his annual visit for eye dr  Respiratory: Negative for cough, chest tightness and shortness of breath.   Cardiovascular: Negative for chest pain, palpitations and leg swelling.  Gastrointestinal: Negative.   Endocrine: Negative.   Genitourinary: Negative.   Musculoskeletal:       Stiffness knees  Skin: Negative.   Allergic/Immunologic: Negative.   Neurological: Negative for dizziness and headaches.  Psychiatric/Behavioral: Negative.   All other systems reviewed and are negative.         Objective:    Vitals: BP 130/83 (BP Location: Left Arm, Patient Position: Sitting, Cuff Size: Large)   Temp 98.2 F (36.8 C)   Ht 6\' 3"  (1.905 m)   Wt 213 lb 1.9 oz (96.7 kg)   SpO2 95%   BMI 26.64 kg/m   Body mass index is 26.64 kg/m.   Physical Exam Vitals signs and nursing note reviewed.  Constitutional:      Appearance: Normal appearance. He is normal weight.  HENT:     Head: Normocephalic.     Right Ear: External ear normal.     Left Ear: External ear normal.     Nose: Nose normal.     Mouth/Throat:     Mouth: Mucous membranes are moist.     Pharynx: Oropharynx is clear.  Eyes:     General:        Right eye: No discharge.        Left eye: No discharge.     Conjunctiva/sclera: Conjunctivae normal.  Neck:     Musculoskeletal: Normal range of motion and neck supple.  Cardiovascular:     Rate and Rhythm: Normal rate and regular rhythm.     Pulses: Normal pulses.     Heart sounds: Normal heart sounds.  Pulmonary:     Effort: Pulmonary effort is normal.     Breath sounds: Normal breath sounds.  Abdominal:     General: Abdomen is flat.     Palpations: Abdomen is soft.   Musculoskeletal: Normal range of motion.  Skin:    General: Skin is warm and dry.     Capillary Refill: Capillary refill takes less than 2 seconds.  Neurological:     Mental Status: He is alert and oriented to person, place, and time.  Psychiatric:        Mood and Affect: Mood normal.        Behavior: Behavior normal.        Thought Content: Thought content normal.        Judgment: Judgment normal.       Advanced Directives 03/15/2017 02/09/2017 02/05/2017 02/03/2017 11/14/2016 01/01/2016 09/03/2014  Does Patient Have a Medical Advance Directive? Yes No No No Yes No No  Type of Advance Directive Healthcare Power of [place] in Chart? No - copy requested - - - - - -  Would patient like information on creating a medical advance directive? - No - Patient declined No - Patient declined No - Patient declined - Yes (ED - Information included in AVS) No - patient declined information    Tobacco Social History  Tobacco Use  Smoking Status Current Every Day Smoker  . Packs/day: 0.50  . Years: 30.00  . Pack years: 15.00  . Types: Cigarettes  Smokeless Tobacco Never Used     Ready to quit: Not Answered Counseling given: Not Answered   Clinical Intake:     Pain : No/denies pain Pain Score: 0-No pain     Diabetes: No  How often do you need to have someone help you when you read instructions, pamphlets, or other written materials from your doctor or pharmacy?: 1 - Never What is the last grade level you completed in school?: 12  Interpreter Needed?: No  Information entered by :: Georgiann Cocker  Past Medical History:  Diagnosis Date  . Bladder cancer ([place]) 2000  . Depression   . Essential hypertension 01/16/2018  . Nicotine addiction   . Prostate cancer ([place]) 07/06/2014  . Seasonal allergies    Past Surgical History:  Procedure Laterality Date  . athroscopy of right knee  2002  . cancerous  polyps removed from bladder  2000  . COLONOSCOPY  MJ 2009 pmhX: POLYPS   POLYP REMOVED BUT NOT RETRIEVED  . COLONOSCOPY  LS 2007 ARS   ? POLYPS, no path available  . COLONOSCOPY  09/26/2010   sessile poylp(2) pan-colonic diverticulosis,mild/internal hemorrhoids  . COLONOSCOPY WITH PROPOFOL N/A 02/09/2017   Procedure: COLONOSCOPY WITH PROPOFOL;  Surgeon: Danie Binder, MD;  [facility];  Service: Endoscopy;  Laterality: N/A;  8:30am  . ESOPHAGOGASTRODUODENOSCOPY (EGD) WITH PROPOFOL N/A 02/09/2017   Procedure: ESOPHAGOGASTRODUODENOSCOPY (EGD) WITH PROPOFOL;  Surgeon: Danie Binder, MD;  [facility];  Service: Endoscopy;  Laterality: N/A;  . FOOT SURGERY Bilateral 2004  . HERNIA REPAIR  1980's   left inguinal hernia  . LYMPHADENECTOMY Bilateral 09/03/2014   Procedure: PELVIC LYMPHADENECTOMY;  Surgeon: Raynelle Bring, MD;  [facility];  Service: Urology;  Laterality: Bilateral;  . POLYPECTOMY  02/09/2017   Procedure: POLYPECTOMY;  Surgeon: Danie Binder, MD;  [facility];  Service: Endoscopy;;  cecal polyp, hepatic flexure polyps x3, transverse colon polyp, descending colon polyps x2, rectal polyps x2  . PROSTATE BIOPSY  07/06/14  . ROBOT ASSISTED LAPAROSCOPIC RADICAL PROSTATECTOMY N/A 09/03/2014   Procedure: ROBOTIC ASSISTED LAPAROSCOPIC RADICAL PROSTATECTOMY LEVEL 2;  Surgeon: Raynelle Bring, MD;  [facility];  Service: Urology;  Laterality: N/A;   Family History  Problem Relation Age of Onset  . Heart attack Father   . Diabetes Sister   . Hypertension Sister   . Hypertension Sister   . Aneurysm Sister        brain   . Arthritis Other        family history   . Colon cancer Neg Hx   . Colon polyps Neg Hx    Social History   Socioeconomic History  . Marital status: Married    Spouse name: Not on file  . Number of children: 2  . Years of education: Not on file  . Highest education level: Not on file  Occupational History  . Occupation:  unemployed   Social Needs  . Financial resource strain: Somewhat hard  . Food insecurity:    Worry: Never true    Inability: Never true  . Transportation needs:    Medical: No    Non-medical: No  Tobacco Use  . Smoking status: Current Every Day Smoker    Packs/day: 0.50    Years: 30.00    Pack years: 15.00  Types: Cigarettes  . Smokeless tobacco: Never Used  Substance and Sexual Activity  . Alcohol use: Yes    Comment: 3 pints/month  . Drug use: Yes    Types: Marijuana    Comment: occassionally  . Sexual activity: Yes    Birth control/protection: None  Lifestyle  . Physical activity:    Days per week: 2 days    Minutes per session: 30 min  . Stress: Not at all  Relationships  . Social connections:    Talks on phone: More than three times a week    Gets together: More than three times a week    Attends religious service: More than 4 times per year    Active member of club or organization: Yes    Attends meetings of clubs or organizations: More than 4 times per year    Relationship status: Married  Other Topics Concern  . Not on file  Social History Narrative  . Not on file    Outpatient Encounter Medications as of 03/22/2018  Medication Sig  . acetaminophen (TYLENOL) 500 MG tablet Take 1,000 mg by mouth every 6 (six) hours as needed for mild pain.  Marland Kitchen amLODipine (NORVASC) 5 MG tablet Take 1 tablet (5 mg total) by mouth daily.  Marland Kitchen buPROPion (WELLBUTRIN SR) 150 MG 12 hr tablet Take 1 tablet (150 mg total) by mouth 2 (two) times daily.  . cyclobenzaprine (FLEXERIL) 10 MG tablet Take 1 tablet (10 mg total) by mouth at bedtime.  Marland Kitchen FLUoxetine (PROZAC) 20 MG capsule TAKE 1 CAPSULE BY MOUTH DAILY.  . fluticasone (FLONASE) 50 MCG/ACT nasal spray Place 2 sprays into both nostrils daily.  Marland Kitchen ibuprofen (ADVIL,MOTRIN) 800 MG tablet Take 1 tablet (800 mg total) by mouth every 8 (eight) hours as needed.  . Multiple Vitamins-Minerals (ONE DAILY MENS 50+ MULTIVIT PO) Take 1 tablet by  mouth daily.  Marland Kitchen omeprazole (PRILOSEC) 20 MG capsule TAKE 1 CAPSULE BY MOUTH 30 MINUTES PRIOR TO BREAKFAST.  Marland Kitchen predniSONE (DELTASONE) 10 MG tablet Take 1 tablet (10 mg total) by mouth 2 (two) times daily with a meal.   No facility-administered encounter medications on file as of 03/22/2018.     Activities of Daily Living No flowsheet data found.  Patient Care Team: Fayrene Helper, MD as PCP - General Danie Binder, MD (Gastroenterology)   Assessment:   This is a routine wellness examination for [place].  Exercise Activities and Dietary recommendations    Goals    . Quit smoking / using tobacco     Starting 01/06/2016 patient would like to quit smoking.       Fall Risk Fall Risk  03/22/2018 01/13/2018 11/24/2017 11/23/2017 06/29/2017  Falls in the past year? 0 0 0 0 No  Number falls in past yr: 0 0 0 0 -  Injury with Fall? 0 0 0 0 -   Is the patient's home free of loose throw rugs in walkways, pet beds, electrical cords, etc?   yes      Grab bars in the bathroom? yes      Handrails on the stairs?   yes      Adequate lighting?   yes  Timed Get Up and Go Performed: less than 5 seconds  Depression Screen PHQ 2/9 Scores 03/22/2018 02/17/2018 01/13/2018 11/24/2017  PHQ - 2 Score 2 1 0 0  PHQ- 9 Score 4 5 - -    Cognitive Function     6CIT Screen 03/22/2018 03/22/2018 03/15/2017 01/01/2016  What Year? 0 points 0 points 0 points 0 points  What month? 0 points 0 points 0 points 0 points  What time? 0 points 0 points 0 points 0 points  Count back from 20 0 points 0 points 0 points 0 points  Months in reverse 0 points 0 points 0 points 0 points  Repeat phrase 0 points 2 points 0 points 0 points  Total Score 0 2 0 0    Immunization History  Administered Date(s) Administered  . Influenza Split 09/20/2010, 10/06/2011  . Influenza Whole 10/02/2008, 09/19/2009  . Influenza,inj,Quad PF,6+ Mos 09/19/2014, 10/23/2015, 10/07/2016  . Pneumococcal Conjugate-13 05/30/2014  .  Pneumococcal Polysaccharide-23 10/23/2015  . Td 10/21/2009  . Zoster 06/25/2010    Qualifies for Shingles Vaccine? Had Zoster in 2012  Screening Tests Health Maintenance  Topic Date Due  . TETANUS/TDAP  10/22/2019  . COLONOSCOPY  02/10/2027  . INFLUENZA VACCINE  Completed  . Hepatitis C Screening  Completed  . PNA vac Low Risk Adult  Completed   Cancer Screenings: Lung: Low Dose CT Chest recommended if Age 32-80 years, 30 pack-year currently smoking OR have quit w/in 15years. Patient does qualify. Last screening was 06/2017-recommendations where for 1 year. Order in place.  Colorectal: 02/2017 last test  Additional Screenings:  Hepatitis C Screening: Negative screen already      Plan:   1. Encounter for Medicare annual wellness exam Overall up to date on the screenings, outside of the below.   - VAS US [employer] MEDICARE SCREEN; Future  2. Tobacco use Asked about quitting: confirms they are currently smokes cigarettes Advise to quit smoking: Educated about QUITTING to reduce the risk of cancer, cardio and cerebrovascular disease. Assess willingness: Unwilling to quit at this time, but is working on cutting back. Assist with counseling and pharmacotherapy: Counseled for 5 minutes and literature provided. Arrange for follow up: not quitting follow up in 3 months and continue to offer help.    - VAS US [employer] MEDICARE SCREEN; Future   I have personally reviewed and noted the following in the patient's chart:   . Medical and social history . Use of alcohol, tobacco or illicit drugs  . Current medications and supplements . Functional ability and status . Nutritional status . Physical activity . Advanced directives . List of other physicians . Hospitalizations, surgeries, and ER visits in previous 12 months . Vitals . Screenings to include cognitive, depression, and falls . Referrals and appointments  In addition, I have reviewed and discussed with patient certain  preventive protocols, quality metrics, and best practice recommendations. A written personalized care plan for preventive services as well as general preventive health recommendations were provided to patient.     Cherly Beach, NP  03/22/2018

## 2018-04-01 ENCOUNTER — Ambulatory Visit (HOSPITAL_COMMUNITY): Admission: RE | Admit: 2018-04-01 | Payer: Medicare Other | Source: Ambulatory Visit

## 2018-04-16 ENCOUNTER — Encounter (HOSPITAL_COMMUNITY): Payer: Self-pay | Admitting: Emergency Medicine

## 2018-04-16 ENCOUNTER — Other Ambulatory Visit: Payer: Self-pay

## 2018-04-16 ENCOUNTER — Emergency Department (HOSPITAL_COMMUNITY): Payer: Medicare Other

## 2018-04-16 ENCOUNTER — Emergency Department (HOSPITAL_COMMUNITY)
Admission: EM | Admit: 2018-04-16 | Discharge: 2018-04-16 | Disposition: A | Discharge status: Home / Self Care | Payer: Medicare Other | Attending: Emergency Medicine | Admitting: Emergency Medicine

## 2018-04-16 DIAGNOSIS — F17210 Nicotine dependence, cigarettes, uncomplicated: Secondary | ICD-10-CM | POA: Diagnosis not present

## 2018-04-16 DIAGNOSIS — Z8551 Personal history of malignant neoplasm of bladder: Secondary | ICD-10-CM | POA: Diagnosis not present

## 2018-04-16 DIAGNOSIS — R200 Anesthesia of skin: Secondary | ICD-10-CM | POA: Diagnosis present

## 2018-04-16 DIAGNOSIS — R208 Other disturbances of skin sensation: Secondary | ICD-10-CM

## 2018-04-16 DIAGNOSIS — I10 Essential (primary) hypertension: Secondary | ICD-10-CM | POA: Insufficient documentation

## 2018-04-16 DIAGNOSIS — R202 Paresthesia of skin: Secondary | ICD-10-CM

## 2018-04-16 DIAGNOSIS — Z8546 Personal history of malignant neoplasm of prostate: Secondary | ICD-10-CM | POA: Diagnosis not present

## 2018-04-16 LAB — COMPREHENSIVE METABOLIC PANEL
ALT: 17 U/L (ref 0–44)
AST: 18 U/L (ref 15–41)
Albumin: 3.8 g/dL (ref 3.5–5.0)
Alkaline Phosphatase: 58 U/L (ref 38–126)
Anion gap: 5 (ref 5–15)
BUN: 12 mg/dL (ref 8–23)
CO2: 24 mmol/L (ref 22–32)
Calcium: 8.3 mg/dL — ABNORMAL LOW (ref 8.9–10.3)
Chloride: 110 mmol/L (ref 98–111)
Creatinine, Ser: 0.99 mg/dL (ref 0.61–1.24)
GFR calc Af Amer: >60 mL/min (ref >60)
GFR calc non Af Amer: >60 mL/min (ref >60)
Glucose, Bld: 100 mg/dL — ABNORMAL HIGH (ref 70–99)
Potassium: 3.7 mmol/L (ref 3.5–5.1)
Sodium: 139 mmol/L (ref 135–145)
Total Bilirubin: 0.8 mg/dL (ref 0.3–1.2)
Total Protein: 6.8 g/dL (ref 6.5–8.1)

## 2018-04-16 LAB — CBC WITH DIFFERENTIAL/PLATELET
Abs Immature Granulocytes: 0.01 10*3/uL (ref 0.00–0.07)
Basophils Absolute: 0 10*3/uL (ref 0.0–0.1)
Basophils Relative: 1 %
Eosinophils Absolute: 0.1 10*3/uL (ref 0.0–0.5)
Eosinophils Relative: 2 %
HCT: 43.1 % (ref 39.0–52.0)
Hemoglobin: 13.9 g/dL (ref 13.0–17.0)
Immature Granulocytes: 0 %
Lymphocytes Relative: 29 %
Lymphs Abs: 1.4 10*3/uL (ref 0.7–4.0)
MCH: 30.6 pg (ref 26.0–34.0)
MCHC: 32.3 g/dL (ref 30.0–36.0)
MCV: 94.9 fL (ref 80.0–100.0)
Monocytes Absolute: 0.6 10*3/uL (ref 0.1–1.0)
Monocytes Relative: 13 %
Neutro Abs: 2.5 10*3/uL (ref 1.7–7.7)
Neutrophils Relative %: 55 %
Platelets: 193 10*3/uL (ref 150–400)
RBC: 4.54 MIL/uL (ref 4.22–5.81)
RDW: 13.3 % (ref 11.5–15.5)
WBC: 4.6 10*3/uL (ref 4.0–10.5)
nRBC: 0 % (ref 0.0–0.2)

## 2018-04-16 NOTE — ED Provider Notes (Signed)
[facility] EMERGENCY DEPARTMENT Provider Note   CSN: [id number] Arrival date & time: 04/16/18  1814    History   Chief Complaint Chief Complaint  Patient presents with  . Numbness    HPI Matthew Adkins is a 68 y.o. male.     Numbness in the palm of the left hand in the tips of the left toe since 0800 today.  No other neurological complaints noted.  Specifically, no visual change, facial asymmetry, arm or leg weakness, speech difficulty.  Nursing notes report "off balance", but this was not noted in my interview.  Severity is mild to moderate.  Nothing makes symptoms better or worse.     Past Medical History:  Diagnosis Date  . Bladder cancer ([place]) 2000  . Depression   . Essential hypertension 01/16/2018  . Nicotine addiction   . Prostate cancer ([place]) 07/06/2014  . Seasonal allergies     Patient Active Problem List   Diagnosis Date Noted  . Headache 02/17/2018  . Essential hypertension 01/16/2018  . GERD (gastroesophageal reflux disease) 07/13/2017  . H/O prostate cancer 07/11/2017  . Heme + stool 01/04/2017  . Tubular adenoma of colon 11/18/2016  . Left shoulder pain 10/30/2015  . Prostate cancer ([place]) 07/25/2014  . Hemorrhoids, internal 09/30/2011  . Arthritis of knee, degenerative 09/30/2011  . Degenerative disc disease, lumbar 09/30/2011  . Pulmonary nodule 09/21/2011  . 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 SKIN TAG 10/21/2009  . Allergic rhinitis 09/19/2009  . Dyslipidemia 08/17/2008  . Prediabetes 10/14/2007  . BLADDER CANCER 04/13/2007  . NICOTINE ADDICTION 04/13/2007    Past Surgical History:  Procedure Laterality Date  . athroscopy of right knee  2002  . cancerous polyps removed from bladder  2000  . COLONOSCOPY  MJ 2009 pmhX: POLYPS   POLYP REMOVED BUT NOT RETRIEVED  . COLONOSCOPY  LS 2007 ARS   ? POLYPS, no path available  . COLONOSCOPY  09/26/2010   sessile poylp(2) pan-colonic  diverticulosis,mild/internal hemorrhoids  . COLONOSCOPY WITH PROPOFOL N/A 02/09/2017   Procedure: COLONOSCOPY WITH PROPOFOL;  Surgeon: Danie Binder, MD;  [facility];  Service: Endoscopy;  Laterality: N/A;  8:30am  . ESOPHAGOGASTRODUODENOSCOPY (EGD) WITH PROPOFOL N/A 02/09/2017   Procedure: ESOPHAGOGASTRODUODENOSCOPY (EGD) WITH PROPOFOL;  Surgeon: Danie Binder, MD;  [facility];  Service: Endoscopy;  Laterality: N/A;  . FOOT SURGERY Bilateral 2004  . HERNIA REPAIR  1980's   left inguinal hernia  . LYMPHADENECTOMY Bilateral 09/03/2014   Procedure: PELVIC LYMPHADENECTOMY;  Surgeon: Raynelle Bring, MD;  [facility];  Service: Urology;  Laterality: Bilateral;  . POLYPECTOMY  02/09/2017   Procedure: POLYPECTOMY;  Surgeon: Danie Binder, MD;  [facility];  Service: Endoscopy;;  cecal polyp, hepatic flexure polyps x3, transverse colon polyp, descending colon polyps x2, rectal polyps x2  . PROSTATE BIOPSY  07/06/14  . ROBOT ASSISTED LAPAROSCOPIC RADICAL PROSTATECTOMY N/A 09/03/2014   Procedure: ROBOTIC ASSISTED LAPAROSCOPIC RADICAL PROSTATECTOMY LEVEL 2;  Surgeon: Raynelle Bring, MD;  [facility];  Service: Urology;  Laterality: N/A;        Home Medications    Prior to Admission medications   Medication Sig Start Date End Date Taking? Authorizing Provider  acetaminophen (TYLENOL) 500 MG tablet Take 1,000 mg by mouth every 6 (six) hours as needed for mild pain.    [provider]  amLODipine (NORVASC) 5 MG tablet Take 1 tablet (5 mg total) by  mouth daily. 01/13/18   Fayrene Helper, MD  buPROPion (WELLBUTRIN SR) 150 MG 12 hr tablet Take 1 tablet (150 mg total) by mouth 2 (two) times daily. 11/24/17   Fayrene Helper, MD  cyclobenzaprine (FLEXERIL) 10 MG tablet Take 1 tablet (10 mg total) by mouth at bedtime. 02/17/18   Fayrene Helper, MD  FLUoxetine (PROZAC) 20 MG capsule TAKE 1 CAPSULE BY MOUTH DAILY. 10/07/17   Fayrene Helper, MD   fluticasone (FLONASE) 50 MCG/ACT nasal spray Place 2 sprays into both nostrils daily. 11/24/17   Fayrene Helper, MD  ibuprofen (ADVIL,MOTRIN) 800 MG tablet Take 1 tablet (800 mg total) by mouth every 8 (eight) hours as needed. 02/17/18   Fayrene Helper, MD  Multiple Vitamins-Minerals (ONE DAILY MENS 50+ MULTIVIT PO) Take 1 tablet by mouth daily.    [provider]  omeprazole (PRILOSEC) 20 MG capsule TAKE 1 CAPSULE BY MOUTH 30 MINUTES PRIOR TO BREAKFAST. 02/17/18   Mahala Menghini, PA-C  predniSONE (DELTASONE) 10 MG tablet Take 1 tablet (10 mg total) by mouth 2 (two) times daily with a meal. 02/17/18   Fayrene Helper, MD    Family History Family History  Problem Relation Age of Onset  . Heart attack Father   . Diabetes Sister   . Hypertension Sister   . Hypertension Sister   . Aneurysm Sister        brain   . Arthritis Other        family history   . Colon cancer Neg Hx   . Colon polyps Neg Hx     Social History Social History   Tobacco Use  . Smoking status: Current Every Day Smoker    Packs/day: 0.50    Years: 30.00    Pack years: 15.00    Types: Cigarettes  . Smokeless tobacco: Never Used  Substance Use Topics  . Alcohol use: Yes    Comment: 3 pints/month  . Drug use: Yes    Types: Marijuana    Comment: occassionally     Allergies   Bee venom and Codeine   Review of Systems Review of Systems  All other systems reviewed and are negative.    Physical Exam Updated Vital Signs BP 137/80   Pulse 66   Temp 97.9 F (36.6 C) (Oral)   Resp 18   Ht 6\' 3"  (1.905 m)   Wt 93 kg   SpO2 97%   BMI 25.62 kg/m   Physical Exam Vitals signs and nursing note reviewed.  Constitutional:      Appearance: He is well-developed.  HENT:     Head: Normocephalic and atraumatic.  Eyes:     Conjunctiva/sclera: Conjunctivae normal.  Neck:     Musculoskeletal: Neck supple.  Cardiovascular:     Rate and Rhythm: Normal rate and regular rhythm.   Pulmonary:     Effort: Pulmonary effort is normal.     Breath sounds: Normal breath sounds.  Abdominal:     General: Bowel sounds are normal.     Palpations: Abdomen is soft.  Musculoskeletal: Normal range of motion.  Skin:    General: Skin is warm and dry.  Neurological:     General: No focal deficit present.     Mental Status: He is alert and oriented to person, place, and time.     Comments: Patient states numbness and tingling in the palm of the left hand the tips of the left toe, but he has full range  of motion and good sensation in these areas.  Psychiatric:        Behavior: Behavior normal.      ED Treatments / Results  Labs (all labs ordered are listed, but only abnormal results are displayed) Labs Reviewed  COMPREHENSIVE METABOLIC PANEL - Abnormal; Notable for the following components:      Result Value   Glucose, Bld 100 (*)    Calcium 8.3 (*)    All other components within normal limits  CBC WITH DIFFERENTIAL/PLATELET    EKG None  Radiology Ct Head Wo Contrast  Result Date: 04/16/2018 CLINICAL DATA:  Left hand and foot numbness. EXAM: CT HEAD WITHOUT CONTRAST TECHNIQUE: Contiguous axial images were obtained from the base of the skull through the vertex without intravenous contrast. COMPARISON:  None. FINDINGS: Brain: No evidence of acute infarction, hemorrhage, hydrocephalus, extra-axial collection or mass lesion/mass effect. Vascular: No hyperdense vessel or unexpected calcification. Skull: Normal. Negative for fracture or focal lesion. Sinuses/Orbits: No acute finding. Other: None. IMPRESSION: Normal head CT. Electronically Signed   By: Marijo Conception, M.D.   On: 04/16/2018 21:07    Procedures Procedures (including critical care time)  Medications Ordered in ED Medications - No data to display   Initial Impression / Assessment and Plan / ED Course  I have reviewed the triage vital signs and the nursing notes.  Pertinent labs & imaging results that were  available during my care of the patient were reviewed by me and considered in my medical decision making (see chart for details).       Uncertain etiology of patient's concerns.  Screening labs and CT scan of head showed no acute findings.  Patient was observed in the ED for several hours with no change in his status.  He will follow-up with his primary care doctor.  Final Clinical Impressions(s) / ED Diagnoses   Final diagnoses:  Numbness and tingling in left hand  Numbness of left foot    ED Discharge Orders    None       Nat Christen, MD 04/17/18 [phone]

## 2018-04-16 NOTE — ED Triage Notes (Signed)
Pt went to bed this morning at 0130 am.  Woke up at 0530 this morning with left hand and left foot numbness. When walking, pt states " I feel off balance"

## 2018-04-16 NOTE — Discharge Instructions (Addendum)
Blood work and CT scan of head were good.  No further recommendations at this time.  Follow-up with your primary care doctor.

## 2018-04-21 ENCOUNTER — Other Ambulatory Visit: Payer: Self-pay

## 2018-04-21 ENCOUNTER — Encounter: Payer: Self-pay | Admitting: Family Medicine

## 2018-04-21 ENCOUNTER — Ambulatory Visit (INDEPENDENT_AMBULATORY_CARE_PROVIDER_SITE_OTHER): Payer: Medicare Other | Admitting: Family Medicine

## 2018-04-21 DIAGNOSIS — R200 Anesthesia of skin: Secondary | ICD-10-CM

## 2018-04-21 DIAGNOSIS — R202 Paresthesia of skin: Secondary | ICD-10-CM | POA: Diagnosis not present

## 2018-04-21 DIAGNOSIS — M4722 Other spondylosis with radiculopathy, cervical region: Secondary | ICD-10-CM | POA: Diagnosis not present

## 2018-04-21 NOTE — Progress Notes (Signed)
Virtual Visit via Telephone Note  I connected with Matthew Adkins on 04/21/18 at  8:00 AM EDT by telephone and verified that I am speaking with the correct person using two identifiers.   I discussed the limitations, risks, security and privacy concerns of performing an evaluation and management service by telephone and the availability of in person appointments. I also discussed with the patient that there may be a patient responsible charge related to this service. The patient expressed understanding and agreed to proceed.  Location of Patient: Home Location of Provider: Telehealth Consent was obtain for visit to be over via telehealth.  History of Present Illness: Mr. Matthew Adkins is a 68 year old male patient of Dr. Griffin Dakin.  Who is well-known to the clinic.  Who presented to the emergency room on April 11 of 2020.  He went to the emergency room to rule out possibly him thinking that he might of been having a heart attack at the time.  He presented with numbness of the palm in the left hand and the tips of his left toe.  They had started the day before in the morning.  He reports that he woke up and noticed the sensation change.  In the emergency room he was noted to not have any other neurological complaints.  This included no visual changes, no facial asymmetry, no arm or leg weakness, or speech difficulty.  The nurse had noted that he seemed a little bit off balance when walking.    He reported the severity to be mild to moderate while he was in the emergency room.  Today he reports that he has had some resolve of the numbness and tingling.  But he still has it present on a scale of 0-10 he reports today having a 3 4 numbness and tingling sensation, when he went to the emergency room he thought it was a 6 to an 8.  There is no noted aggravation or anything that makes the symptoms worse.  And there is nothing that has appeared to make it better.  Just spontaneously started to resolve on its  own.  In the emergency room he was positive for full range of motion and good sensation in those areas when tested.  Even though he reported numbness and tingling during the physical examination. In conversation today Mr. Matthew Adkins denies having any injury or trauma to the neck arm or foot on the left side.  He reports that he still has a good grip, can pick up things, and does not have problems lifting anything above his head.  Reports that he still has good range of motion with his left arm and his left foot and leg.  The emergency room found no acute findings pending his head CT.  He was observed for several hours without any change in his status.  And was discharged for follow-up with his primary care provider.  Of note, Mr. Matthew Adkins was seen by Dr. Moshe Cipro back on February 13 of 2020.  Secondary to having 1 week of ongoing throbbing headaches on average of 20 minutes daily.  Left posterior skull has waking him from sleep several times at that time when he was being seen.  He reports that his headaches are better today.  But has not taken anything to help with this.  Her Moshe Cipro ordered a C-spine x-ray on that date as well.  Which demonstrated moderate disc space narrowing at C6-7, facet osteoarthritis at C2-3 and C3-4 with some left foraminal narrowing of C3-4.  Could be the culprit of his current numbness and tingling, and headaches. All the narrowing is mild, and the osteoarthritis is mild, these could still cause some irritation to the nerves that come off that travel to the periphery.  Overall Mr. Matthew Adkins says that he is feeling much better.  The only reason he went to the emergency room was to rule out if he was having a heart attack or not.  He does not need anything else today.  Has no other complaints of any other signs or symptoms.  Is negative for fever, chills, chest pain, shortness of breath, cough, wheezing.  Any other signs or symptoms of infection.  Past Medical, Surgical, Social History,  Allergies, and Medications have been Reviewed.   Review of Systems  Constitutional: Negative for chills, diaphoresis and fever.  HENT: Negative for congestion, sinus pain and sore throat.   Eyes: Negative.   Respiratory: Negative for cough, shortness of breath and wheezing.   Cardiovascular: Negative for chest pain, palpitations and leg swelling.  Gastrointestinal: Negative.   Genitourinary: Negative.   Musculoskeletal: Negative.  Negative for neck pain.  Neurological: Positive for tingling. Negative for dizziness, weakness and headaches.       Numbness- left hand palm, and left great toe  Psychiatric/Behavioral: Negative.   All other systems reviewed and are negative.  Observations/Objective: Physical Exam Nursing note reviewed.  Neurological:     Mental Status: He is alert and oriented to person, place, and time.     Comments: Inability to test any of his neurological deficits.  Consider review of emergency room note from last week.  Psychiatric:        Mood and Affect: Mood normal.        Behavior: Behavior normal.        Thought Content: Thought content normal.        Judgment: Judgment normal.     Comments: Communication during telephone visit today.    Assessment and Plan: 1. Numbness and tingling in left hand Stable, currently still reports having numbness and tingling in the left hand.  Not as much in the left toes.  Denies doing anything that has helped to resolve or improve this.  Provided that his C-spine did show some changes possibly might have osteoarthritis of the spine with radiculopathy occurring.  Provided education about this being a possible cause of the current situation and symptoms.  Patient verbalized understanding and is in agreement with plan of treatment to utilize Tylenol as needed for arthritis pain.  And can use a heating pad as needed.  He was further educated about signs or symptoms that would need him to urgently report back to the emergency room  versus to contact the office.  Did symptoms worsen could consider sending him to an orthopedic, neurosurgeon pending what their thoughts are of his results of his current C-spine x-ray possible need of getting a MRI/CT in the future if symptoms worsen.  2. Osteoarthritis of spine with radiculopathy, cervical region See above, osteoarthritis was found on C-spine in February x-ray.  Patient reports that his headaches are improving.  Also reports that he has had some resolve of numbness and tingling even though it is still there.  He is just thankful that is not cardiac related.  Is understanding that is possibly could be from his osteoarthritis of the spine and some narrowing of the areas that are near his nerves.  Once again educated about the signs and symptoms that would press him to go  back to the emergency room first rather than calling us if the symptoms worsen. Patient verbalized understanding of treatment plan and is in agreement.  Follow Up Instructions: AVS printed and mailed    I discussed the assessment and treatment plan with the patient. The patient was provided an opportunity to ask questions and all were answered. The patient agreed with the plan and demonstrated an understanding of the instructions.   The patient was advised to call back or seek an in-person evaluation if the symptoms worsen or if the condition fails to improve as anticipated.  I provided 15  minutes of non-face-to-face time during this encounter.   Perlie Mayo, NP

## 2018-04-21 NOTE — Patient Instructions (Addendum)
    Thank you for completing your follow-up from the emergency room visit today via telephone. I appreciate the opportunity to provide you with the care for your health and wellness. Today we discussed: Ongoing numbness and tingling in the left hand and osteoarthritis of your cervical spine.  Your previous cervical spine x-ray had shown some disc narrowing and osteoarthritis on the left side.  This could be contributing to your numbness and tingling in your left hand.  As the nerves that come out of the neck that go to the arm and leg can become inflamed or irritated secondary to the narrowing and overgrowth of bone.  I am happy that the numbness and tingling seem to be resolving some.  Also happy to hear that your headaches from previous office visits have started to become better.  You can continue to use Tylenol for discomfort and pain.  Also heating pads can sometimes be soothing to arthritic joints and sore areas.  Should the numbness tingling worsen or if you develop weakness in your arms or legs please do not hesitate to call us and let us know what is going on.  If you develop speech trouble or if you are having difficulty walking, or lose all sensation in your arm, or the ability to move your arm.  Please call us and consider going back to the emergency room.   [place] YOUR HANDS WELL AND FREQUENTLY. AVOID TOUCHING YOUR FACE, UNLESS YOUR HANDS ARE FRESHLY WASHED.  GET FRESH AIR DAILY. STAY HYDRATED WITH WATER.   It was a pleasure to see you and I look forward to continuing to work together on your health and well-being. Please do not hesitate to call the office if you need care or have questions about your care.  Have a wonderful day and week. With Gratitude, Cherly Beach, DNP, AGNP-BC

## 2018-05-01 ENCOUNTER — Encounter: Payer: Self-pay | Admitting: Orthopedic Surgery

## 2018-05-17 ENCOUNTER — Other Ambulatory Visit: Payer: Self-pay

## 2018-05-17 MED ORDER — FLUOXETINE HCL 20 MG PO CAPS: 20.0000 mg | ORAL_CAPSULE | Freq: Every day | ORAL | 1 refills | Status: DC

## 2018-06-16 ENCOUNTER — Other Ambulatory Visit: Payer: Self-pay | Admitting: Family Medicine

## 2018-06-20 ENCOUNTER — Encounter: Payer: Medicare Other | Admitting: Family Medicine

## 2018-06-28 ENCOUNTER — Encounter: Payer: Self-pay | Admitting: Gastroenterology

## 2018-07-01 ENCOUNTER — Ambulatory Visit (HOSPITAL_COMMUNITY): Admission: RE | Admit: 2018-07-01 | Payer: Medicare Other | Source: Ambulatory Visit

## 2018-07-12 ENCOUNTER — Encounter: Payer: Medicare Other | Admitting: Family Medicine

## 2018-07-19 ENCOUNTER — Ambulatory Visit (INDEPENDENT_AMBULATORY_CARE_PROVIDER_SITE_OTHER): Payer: Medicare Other | Admitting: Family Medicine

## 2018-07-19 ENCOUNTER — Encounter (INDEPENDENT_AMBULATORY_CARE_PROVIDER_SITE_OTHER): Payer: Self-pay

## 2018-07-19 ENCOUNTER — Other Ambulatory Visit: Payer: Self-pay

## 2018-07-19 ENCOUNTER — Encounter: Payer: Self-pay | Admitting: Family Medicine

## 2018-07-19 VITALS — BP 130/80 | HR 68 | Temp 98.7°F | Ht 75.0 in | Wt 204.1 lb

## 2018-07-19 DIAGNOSIS — R7303 Prediabetes: Secondary | ICD-10-CM

## 2018-07-19 DIAGNOSIS — I10 Essential (primary) hypertension: Secondary | ICD-10-CM | POA: Diagnosis not present

## 2018-07-19 DIAGNOSIS — F320 Major depressive disorder, single episode, mild: Secondary | ICD-10-CM | POA: Diagnosis not present

## 2018-07-19 DIAGNOSIS — E785 Hyperlipidemia, unspecified: Secondary | ICD-10-CM

## 2018-07-19 DIAGNOSIS — Z0000 Encounter for general adult medical examination without abnormal findings: Secondary | ICD-10-CM

## 2018-07-19 DIAGNOSIS — F17200 Nicotine dependence, unspecified, uncomplicated: Secondary | ICD-10-CM | POA: Diagnosis not present

## 2018-07-19 DIAGNOSIS — F419 Anxiety disorder, unspecified: Secondary | ICD-10-CM

## 2018-07-19 MED ORDER — BUPROPION HCL ER (SR) 150 MG PO TB12: 150.0000 mg | ORAL_TABLET | Freq: Two times a day (BID) | ORAL | 3 refills | Status: DC

## 2018-07-19 NOTE — Patient Instructions (Addendum)
    Thank you for coming into the office today. I appreciate the opportunity to provide you with the care for your health and wellness. Today we discussed: overall wellness  Follow Up: 4 months   Labs 1 week before  Continue to work out 30 minutes most days of the week. Eat a well balanced diet.  HEALTH MAINTENANCE RECOMMENDATIONS:  It is recommended that you get at least 30 minutes of aerobic exercise at least 5 days/week (for weight loss, you may need as much as 60-90 minutes). This can be any activity that gets your heart rate up. This can be divided in 10-15 minute intervals if needed, but try and build up your endurance at least once a week.  Weight bearing exercise is also recommended twice weekly.  Eat a healthy diet with lots of vegetables, fruits and fiber.  "Colorful" foods have a lot of vitamins (ie green vegetables, tomatoes, red peppers, etc).  Limit sweet tea, regular sodas and alcoholic beverages, all of which has a lot of calories and sugar.  Up to 2 alcoholic drinks daily may be beneficial for men (unless trying to lose weight, watch sugars).  Drink a lot of water.  Sunscreen of at least SPF 30 should be used on all sun-exposed parts of the skin when outside between the hours of 10 am and 4 pm (not just when at beach or pool, but even with exercise, golf, tennis, and yard work!)  Use a sunscreen that says "broad spectrum" so it covers both UVA and UVB rays, and make sure to reapply every 1-2 hours.  Remember to change the batteries in your smoke detectors when changing your clock times in the spring and fall.  Use your seat belt every time you are in a car, and please drive safely and not be distracted with cell phones and texting while driving.  Please continue to practice social distancing to keep you, your family, and our community safe.  If you must go out, please wear a Mask and practice good handwashing.  [place] YOUR HANDS WELL AND FREQUENTLY. AVOID TOUCHING YOUR  FACE, UNLESS YOUR HANDS ARE FRESHLY WASHED.  GET FRESH AIR DAILY. STAY HYDRATED WITH WATER.   It was a pleasure to see you and I look forward to continuing to work together on your health and well-being. Please do not hesitate to call the office if you need care or have questions about your care.  Have a wonderful day and week.  With Gratitude,  Cherly Beach, DNP, AGNP-BC

## 2018-07-19 NOTE — Progress Notes (Signed)
Matthew Adkins is a 68 y.o. male who presents for annual wellness visit and follow-up on chronic medical conditions.  No concerns today. Reports taking medications as directed.  Today patient denies signs and symptoms of COVID 19 infection including fever, chills, cough, shortness of breath, and headache.  Past Medical, Surgical, Social History, Allergies, and Medications have been Reviewed.   Immunization History  Administered Date(s) Administered  . Influenza Split 09/20/2010, 10/06/2011  . Influenza Whole 10/02/2008, 09/19/2009  . Influenza, High Dose Seasonal PF 11/05/2017  . Influenza,inj,Quad PF,6+ Mos 09/19/2014, 10/23/2015, 10/07/2016  . Pneumococcal Conjugate-13 05/30/2014  . Pneumococcal Polysaccharide-23 10/23/2015  . Td 10/21/2009  . Zoster 06/25/2010   Last colonoscopy: Feb 2019, f/u in 3 years per note Last PSA: 0.00 per urology (prostate removed) Dentist: year ago has partial. Ophtho: needs follow up, will make appt Exercise: plays golf every now and then   Other doctors caring for patient include: Dr Moshe Cipro  Depression screen:  See scanned questionnaire.   ADL screen:  See scanned questionnaire.   End of Life Discussion:  Patient working with daughter to get a  a living will and medical power of attorney  The patient denies anorexia, fever, weight changes, headaches,  vision loss, decreased hearing, ear pain, hoarseness, chest pain, palpitations, dizziness, syncope, dyspnea on exertion, cough, swelling, nausea, vomiting, diarrhea, constipation, abdominal pain, melena, hematochezia, indigestion/heartburn, hematuria, incontinence, erectile dysfunction, nocturia, weakened urine stream, dysuria, genital lesions, joint pains, numbness, tingling, weakness, tremor, suspicious skin lesions, depression, anxiety, abnormal bleeding/bruising, or enlarged lymph nodes  PHYSICAL EXAM:  BP 130/80 (BP Location: Left Arm, Patient Position: Sitting, Cuff Size: Normal)    Pulse 68   Temp 98.7 F (37.1 C) (Oral)   Ht 6\' 3"  (1.905 m)   Wt 204 lb 1.3 oz (92.6 kg)   SpO2 97%   BMI 25.51 kg/m    Physical Exam Vitals signs and nursing note reviewed.  Constitutional:      Appearance: Normal appearance. He is well-developed, well-groomed and overweight.  HENT:     Head: Normocephalic and atraumatic.     Right Ear: External ear normal.     Left Ear: External ear normal.     Nose: Nose normal.  Eyes:     General:        Right eye: No discharge.        Left eye: No discharge.     Conjunctiva/sclera: Conjunctivae normal.  Neck:     Musculoskeletal: Normal range of motion and neck supple.     Thyroid: No thyroid mass, thyromegaly or thyroid tenderness.  Cardiovascular:     Rate and Rhythm: Normal rate and regular rhythm.     Pulses: Normal pulses.     Heart sounds: Normal heart sounds.  Pulmonary:     Effort: Pulmonary effort is normal.     Breath sounds: Normal breath sounds.  Abdominal:     General: Abdomen is flat. Bowel sounds are normal.     Palpations: Abdomen is soft.  Musculoskeletal: Normal range of motion.  Lymphadenopathy:     Cervical: No cervical adenopathy.  Skin:    General: Skin is warm.     Capillary Refill: Capillary refill takes less than 2 seconds.  Neurological:     General: No focal deficit present.     Mental Status: He is alert and oriented to person, place, and time.     Cranial Nerves: Cranial nerves are intact.     Sensory: Sensation is intact.  Motor: Motor function is intact.     Coordination: Coordination is intact.     Gait: Gait is intact.  Psychiatric:        Attention and Perception: Attention and perception normal.        Mood and Affect: Mood and affect normal.        Speech: Speech normal.        Behavior: Behavior normal. Behavior is cooperative.        Thought Content: Thought content normal.        Cognition and Memory: Cognition and memory normal.        Judgment: Judgment normal.       ASSESSMENT/PLAN:  1. Essential hypertension Controlled, continue current medication regimen.   No refills needed. Will assess labs at next visit. Encouraged exercise and dash diet.  - COMPLETE METABOLIC PANEL WITH GFR - CBC  2. 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 Encouraged self care.   - buPROPion (WELLBUTRIN SR) 150 MG 12 hr tablet; Take 1 tablet (150 mg total) by mouth 2 (two) times daily.  Dispense: 60 tablet; Refill: 3  3. NICOTINE ADDICTION Asked about quitting: confirms they are currently smokes cigarettes Advise to quit smoking: Educated about QUITTING to reduce the risk of cancer, cardio and cerebrovascular disease. Assess willingness: Unwilling to quit at this time, but is working on cutting back. Assist with counseling and pharmacotherapy: Counseled for 5 minutes and literature provided. wellbutrin refilled Arrange for follow up:  not quitting follow up in 3 months and continue to offer help.   - buPROPion (WELLBUTRIN SR) 150 MG 12 hr tablet; Take 1 tablet (150 mg total) by mouth 2 (two) times daily.  Dispense: 60 tablet; Refill: 3  4. Dyslipidemia Previous elevation of LDL, will recheck, reports better diet. Encouraged exercise. - Lipid panel  5. Prediabetes Previous A1c was stable 6.5% will recheck, reports improved diet.  - Hemoglobin A1c  6. Healthcare maintenance  Discussed PSA screening (risks/benefits), recommended at least 30 minutes of aerobic activity at least 5 days/week; proper sunscreen use reviewed; healthy diet and alcohol recommendations (less than or equal to 2 drinks/day) reviewed; regular seatbelt use; changing batteries in smoke detectors. Immunization recommendations discussed.  Colonoscopy recommendations reviewed.   Medicare Attestation I have personally reviewed: The patient's medical and social history Their use of alcohol, tobacco or illicit drugs Their current  medications and supplements The patient's functional ability including ADLs,fall risks, home safety risks, cognitive, and hearing and visual impairment Diet and physical activities Evidence for depression or mood disorders  The patient's weight, height, BMI, and visual acuity have been recorded in the chart.  I have made referrals, counseling, and provided education to the patient based on review of the above and I have provided the patient with a written personalized care plan for preventive services.     Follow Up: 4 months labs 1 wk before  Perlie Mayo, NP   07/19/2018

## 2018-08-22 ENCOUNTER — Telehealth: Payer: Self-pay | Admitting: *Deleted

## 2018-08-22 ENCOUNTER — Other Ambulatory Visit: Payer: Self-pay

## 2018-08-22 MED ORDER — FLUOXETINE HCL 20 MG PO CAPS: 20.0000 mg | ORAL_CAPSULE | Freq: Every day | ORAL | 5 refills | Status: DC

## 2018-08-22 NOTE — Telephone Encounter (Signed)
Pt called needing a refill on his prozac. Said the pharmacy was waiting on a prior authorization and he was about to be out of his medications.

## 2018-08-22 NOTE — Telephone Encounter (Signed)
Med refilled.

## 2018-10-03 ENCOUNTER — Other Ambulatory Visit: Payer: Self-pay

## 2018-10-03 ENCOUNTER — Ambulatory Visit (INDEPENDENT_AMBULATORY_CARE_PROVIDER_SITE_OTHER): Payer: Medicare Other

## 2018-10-03 DIAGNOSIS — Z23 Encounter for immunization: Secondary | ICD-10-CM

## 2018-10-10 ENCOUNTER — Other Ambulatory Visit: Payer: Self-pay

## 2018-10-10 ENCOUNTER — Ambulatory Visit (INDEPENDENT_AMBULATORY_CARE_PROVIDER_SITE_OTHER): Payer: Medicare Other | Admitting: Orthopedic Surgery

## 2018-10-10 ENCOUNTER — Encounter: Payer: Self-pay | Admitting: Orthopedic Surgery

## 2018-10-10 ENCOUNTER — Ambulatory Visit: Payer: Medicare Other

## 2018-10-10 VITALS — BP 127/75 | HR 67 | Ht 75.0 in | Wt 200.0 lb

## 2018-10-10 DIAGNOSIS — M170 Bilateral primary osteoarthritis of knee: Secondary | ICD-10-CM | POA: Diagnosis not present

## 2018-10-10 DIAGNOSIS — M545 Low back pain, unspecified: Secondary | ICD-10-CM

## 2018-10-10 DIAGNOSIS — M1710 Unilateral primary osteoarthritis, unspecified knee: Secondary | ICD-10-CM

## 2018-10-10 MED ORDER — METHYLPREDNISOLONE ACETATE 40 MG/ML IJ SUSP
40.0000 mg | Freq: Once | INTRAMUSCULAR | Status: AC
  Administered 2018-10-10: 40 mg via INTRAMUSCULAR

## 2018-10-10 NOTE — Progress Notes (Signed)
Chief Complaint  Patient presents with  . Follow-up    Recheck on bilateral knees.     68 year old male with bilateral knee arthritis followed yearly for x-rays.  He says his knees are in pretty good shape not hurting him and in a significant way he has an occasional click or pop when he moves a certain way but nothing that prevents his function or causes him any pain that requires any medication  Does complain of some right hip pain for the last 2 to 3 weeks seems to radiate down the right leg and is located over the right gluteal area   Review of Systems  Constitutional: Negative for chills and fever.  Musculoskeletal: Positive for back pain and joint pain.  Neurological: Positive for tingling and sensory change.     Physical Exam BP 127/75   Pulse 67   Ht 6\' 3"  (1.905 m)   Wt 200 lb (90.7 kg)   BMI 25.00 kg/m   Oriented x3 mood and affect normal  General appearance excellent grooming hygiene  Right knee minimal tenderness medial joint line mild varus deformity flexion arc is 120 degrees ligaments are stable muscle tone is normal  Left knee minimal tenderness medial joint line mild varus deformity flexion arc 125 degrees ligaments stable muscle tone normal  Nontender lumbar spine tenderness right gluteal area  X-ray see report:   right knee shows osteoarthritis severe with mild varus  Left knee shows osteoarthritis severe with mild varus  Encounter Diagnoses  Name Primary?  Marland Kitchen Arthritis of knee Yes  . Primary osteoarthritis of both knees     PLAN:   Recommend injection right hip IM shot  Nurse has given injection 40 mg of Depo-Medrol 1% lidocaine 3 cc  Follow-up in a year repeat x-rays

## 2018-10-13 ENCOUNTER — Other Ambulatory Visit: Payer: Self-pay | Admitting: *Deleted

## 2018-10-13 DIAGNOSIS — Z20822 Contact with and (suspected) exposure to covid-19: Secondary | ICD-10-CM

## 2018-10-15 LAB — NOVEL CORONAVIRUS, NAA: SARS-CoV-2, NAA: DETECTED — AB

## 2018-11-21 ENCOUNTER — Encounter: Payer: Self-pay | Admitting: Family Medicine

## 2018-11-21 ENCOUNTER — Ambulatory Visit (INDEPENDENT_AMBULATORY_CARE_PROVIDER_SITE_OTHER): Payer: Medicare Other | Admitting: Family Medicine

## 2018-11-21 ENCOUNTER — Other Ambulatory Visit: Payer: Self-pay

## 2018-11-21 VITALS — BP 127/75 | Ht 75.0 in | Wt 200.0 lb

## 2018-11-21 DIAGNOSIS — F17200 Nicotine dependence, unspecified, uncomplicated: Secondary | ICD-10-CM

## 2018-11-21 DIAGNOSIS — F17210 Nicotine dependence, cigarettes, uncomplicated: Secondary | ICD-10-CM

## 2018-11-21 DIAGNOSIS — R7303 Prediabetes: Secondary | ICD-10-CM

## 2018-11-21 DIAGNOSIS — E785 Hyperlipidemia, unspecified: Secondary | ICD-10-CM

## 2018-11-21 DIAGNOSIS — F320 Major depressive disorder, single episode, mild: Secondary | ICD-10-CM

## 2018-11-21 DIAGNOSIS — I10 Essential (primary) hypertension: Secondary | ICD-10-CM

## 2018-11-21 DIAGNOSIS — R7301 Impaired fasting glucose: Secondary | ICD-10-CM

## 2018-11-21 DIAGNOSIS — E559 Vitamin D deficiency, unspecified: Secondary | ICD-10-CM

## 2018-11-21 DIAGNOSIS — F419 Anxiety disorder, unspecified: Secondary | ICD-10-CM

## 2018-11-21 MED ORDER — BUPROPION HCL ER (SR) 150 MG PO TB12: 150.0000 mg | ORAL_TABLET | Freq: Two times a day (BID) | ORAL | 5 refills | Status: DC

## 2018-11-21 NOTE — Patient Instructions (Addendum)
Annual physical exam due 11/19 or after, pls schedule in office with MD  Please fax LAB ORDER FOR FASTING LIPID, CMP AND egR, Hba1c,TSH AND VIT d AND CBC TO HOSPITAL, WILL GET IN AM  START 9 CIGARETTES/ DAY, AND CUT BACK BY 1 TO 2 CIGARETTES EACH WEEK  PLEASE RESUME WELLBUTRIN, THIS HELPS WITH QUITTING Thanks for choosing [facility], we consider it a privelige to serve you.   Steps to Quit Smoking Smoking tobacco is the leading cause of preventable death. It can affect almost every organ in the body. Smoking puts you and people around you at risk for many serious, long-lasting (chronic) diseases. Quitting smoking can be hard, but it is one of the best things that you can do for your health. It is never too late to quit. How do I get ready to quit? When you decide to quit smoking, make a plan to help you succeed. Before you quit:  Pick a date to quit. Set a date within the next 2 weeks to give you time to prepare.  Write down the reasons why you are quitting. Keep this list in places where you will see it often.  Tell your family, friends, and co-workers that you are quitting. Their support is important.  Talk with your doctor about the choices that may help you quit.  Find out if your health insurance will pay for these treatments.  Know the people, places, things, and activities that make you want to smoke (triggers). Avoid them. What first steps can I take to quit smoking?  Throw away all cigarettes at home, at work, and in your car.  Throw away the things that you use when you smoke, such as ashtrays and lighters.  Clean your car. Make sure to empty the ashtray.  Clean your home, including curtains and carpets. What can I do to help me quit smoking? Talk with your doctor about taking medicines and seeing a counselor at the same time. You are more likely to succeed when you do both.  If you are pregnant or breastfeeding, talk with your doctor about counseling or  other ways to quit smoking. Do not take medicine to help you quit smoking unless your doctor tells you to do so. To quit smoking: Quit right away  Quit smoking totally, instead of slowly cutting back on how much you smoke over a period of time.  Go to counseling. You are more likely to quit if you go to counseling sessions regularly. Take medicine You may take medicines to help you quit. Some medicines need a prescription, and some you can buy over-the-counter. Some medicines may contain a drug called nicotine to replace the nicotine in cigarettes. Medicines may:  Help you to stop having the desire to smoke (cravings).  Help to stop the problems that come when you stop smoking (withdrawal symptoms). Your doctor may ask you to use:  Nicotine patches, gum, or lozenges.  Nicotine inhalers or sprays.  Non-nicotine medicine that is taken by mouth. Find resources Find resources and other ways to help you quit smoking and remain smoke-free after you quit. These resources are most helpful when you use them often. They include:  Online chats with a Social worker.  Phone quitlines.  Printed Furniture conservator/restorer.  Support groups or group counseling.  Text messaging programs.  Mobile phone apps. Use apps on your mobile phone or tablet that can help you stick to your quit plan. There are many free apps for mobile phones and tablets  as well as websites. Examples include Quit Guide from the [employer] and smokefree.gov  What things can I do to make it easier to quit?   Talk to your family and friends. Ask them to support and encourage you.  Call a phone quitline (1-800-QUIT-NOW), reach out to support groups, or work with a Social worker.  Ask people who smoke to not smoke around you.  Avoid places that make you want to smoke, such as: ? Bars. ? Parties. ? Smoke-break areas at work.  Spend time with people who do not smoke.  Lower the stress in your life. Stress can make you want to smoke. Try  these things to help your stress: ? Getting regular exercise. ? Doing deep-breathing exercises. ? Doing yoga. ? Meditating. ? Doing a body scan. To do this, close your eyes, focus on one area of your body at a time from head to toe. Notice which parts of your body are tense. Try to relax the muscles in those areas. How will I feel when I quit smoking? Day 1 to 3 weeks Within the first 24 hours, you may start to have some problems that come from quitting tobacco. These problems are very bad 2-3 days after you quit, but they do not often last for more than 2-3 weeks. You may get these symptoms:  Mood swings.  Feeling restless, nervous, angry, or annoyed.  Trouble concentrating.  Dizziness.  Strong desire for high-sugar foods and nicotine.  Weight gain.  Trouble pooping (constipation).  Feeling like you may vomit (nausea).  Coughing or a sore throat.  Changes in how the medicines that you take for other issues work in your body.  Depression.  Trouble sleeping (insomnia). Week 3 and afterward After the first 2-3 weeks of quitting, you may start to notice more positive results, such as:  Better sense of smell and taste.  Less coughing and sore throat.  Slower heart rate.  Lower blood pressure.  Clearer skin.  Better breathing.  Fewer sick days. Quitting smoking can be hard. Do not give up if you fail the first time. Some people need to try a few times before they succeed. Do your best to stick to your quit plan, and talk with your doctor if you have any questions or concerns. Summary  Smoking tobacco is the leading cause of preventable death. Quitting smoking can be hard, but it is one of the best things that you can do for your health.  When you decide to quit smoking, make a plan to help you succeed.  Quit smoking right away, not slowly over a period of time.  When you start quitting, seek help from your doctor, family, or friends. This information is not  intended to replace advice given to you by your health care provider. Make sure you discuss any questions you have with your health care provider. Document Released: 10/18/2008 Document Revised: 03/11/2018 Document Reviewed: 03/12/2018 Elsevier Patient Education  2020 [employer].

## 2018-11-21 NOTE — Assessment & Plan Note (Signed)
Controlled, no change in medication DASH diet and commitment to daily physical activity for a minimum of 30 minutes discussed and encouraged, as a part of hypertension management. The importance of attaining a healthy weight is also discussed.  BP/Weight 11/21/2018 10/10/2018 07/19/2018 04/16/2018 03/22/2018 02/17/2018 [id number]  Systolic BP [id number]-[id number] AB-123456789 0000000 AB-123456789 AB-123456789 [id number]  Diastolic BP 75 75 80 80 83 78 79  Wt. (Lbs) 200 200 204.08 205 213.12 211 214  BMI 25 25 25.51 25.62 26.64 26.37 26.75

## 2018-11-21 NOTE — Progress Notes (Signed)
Virtual Visit via Telephone Note  I connected with Matthew Adkins on 11/21/18 at 10:20 AM EST by telephone and verified that I am speaking with the correct person using two identifiers.  Location: Patient: home  Provider: office   I discussed the limitations, risks, security and privacy concerns of performing an evaluation and management service by telephone and the availability of in person appointments. I also discussed with the patient that there may be a patient responsible charge related to this service. The patient expressed understanding and agreed to proceed.   History of Present Illness: F/u chronic problems, and medication review Reports doing well  Overall, no new concerns  Denies recent fever or chills. Denies sinus pressure, nasal congestion, ear pain or sore throat. Denies chest congestion, productive cough or wheezing. Denies chest pains, palpitations and leg swelling Denies abdominal pain, nausea, vomiting,diarrhea or constipation.   Denies dysuria, frequency, hesitancy or incontinence. Denies uncontrolled joint pain, swelling and limitation in mobility. Denies headaches, seizures, numbness, or tingling. Denies uncontrolled depression, anxiety or insomnia. Denies skin break down or rash.       Observations/Objective:  BP 127/75   Ht 6\' 3"  (1.905 m)   Wt 200 lb (90.7 kg)   BMI 25.00 kg/m  Good communication with no confusion and intact memory. Alert and oriented x 3 No signs of respiratory distress during speech   Assessment and Plan:  Essential hypertension Controlled, no change in medication DASH diet and commitment to daily physical activity for a minimum of 30 minutes discussed and encouraged, as a part of hypertension management. The importance of attaining a healthy weight is also discussed.  BP/Weight 11/21/2018 10/10/2018 07/19/2018 04/16/2018 03/22/2018 02/17/2018 [id number]  Systolic BP [id number] [id number] AB-123456789 0000000 AB-123456789 AB-123456789 [id number]  Diastolic BP 75 75 80 80 83 78  79  Wt. 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 The fact that changes in all these areas will reduce or eliminate all together the development of diabetes is stressed.   Diabetic Labs Latest Ref Rng & Units 04/16/2018 01/21/2018 11/29/2017 07/02/2017 02/05/2017  HbA1c 4.8 - 5.6 % - 6.5(H) - 6.4(H) -  Chol 0 - 200 mg/dL - 192 - 178 -  HDL >40 mg/dL - 54 - 37(L) -  Calc LDL 0 - 99 mg/dL - 129(H) - 119(H) -  Triglycerides <150 mg/dL - 45 - 111 -  Creatinine 0.61 - 1.24 mg/dL 0.99 0.98 0.96 - 0.89   BP/Weight 11/21/2018 10/10/2018 07/19/2018 04/16/2018 03/22/2018 02/17/2018 [id number]  Systolic BP [id number] AB-[id number] AB-123456789 0000000 AB-123456789 AB-123456789 [id number]  Diastolic BP 75 75 80 80 83 78 79  Wt. (Lbs) 200 200 204.08 205 213.12 211 214  BMI 25 25 25.51 25.62 26.64 26.37 26.75   No flowsheet data found.  Updated lab needed at/ before next visit.   Dyslipidemia Hyperlipidemia:Low fat diet  discussed and encouraged.   Lipid Panel  Lab Results  Component Value Date   CHOL 192 01/21/2018   HDL 54 01/21/2018   LDLCALC 129 (H) 01/21/2018   LDLDIRECT 105 08/22/2014   TRIG 45 01/21/2018   CHOLHDL 3.6 01/21/2018  needs to reduce fat intake  Updated lab needed at/ before next visit.       Follow Up Instructions:    I discussed the assessment and treatment plan  with the patient. The patient was provided an opportunity to ask questions and all were answered. The patient agreed with the plan and demonstrated an understanding of the instructions.   The patient was advised to call back or seek an in-person evaluation if the symptoms worsen or if the condition fails to improve as anticipated.  I provided 25 minutes of non-face-to-face time during this encounter.   [name], MD

## 2018-11-26 NOTE — Assessment & Plan Note (Addendum)
Asked:confirms currently smokes cigarettes Assess: Unwilling to quit but cutting back Advise: needs to QUIT to reduce risk of cancer, cardio and cerebrovascular disease Assist: counseled for 5 minutes and literature provided Arrange: follow up in 3 months Resume Wellbutrin to help with quitting

## 2018-11-26 NOTE — Assessment & Plan Note (Signed)
Controlled, no change in medication  

## 2018-11-26 NOTE — Assessment & Plan Note (Signed)
Patient educated about the importance of limiting  Carbohydrate intake , the need to commit to daily physical activity for a minimum of 30 minutes , and to commit weight loss. The fact that changes in all these areas will reduce or eliminate all together the development of diabetes is stressed.   Diabetic Labs Latest Ref Rng & Units 04/16/2018 01/21/2018 11/29/2017 07/02/2017 02/05/2017  HbA1c 4.8 - 5.6 % - 6.5(H) - 6.4(H) -  Chol 0 - 200 mg/dL - 192 - 178 -  HDL >40 mg/dL - 54 - 37(L) -  Calc LDL 0 - 99 mg/dL - 129(H) - 119(H) -  Triglycerides <150 mg/dL - 45 - 111 -  Creatinine 0.61 - 1.24 mg/dL 0.99 0.98 0.96 - 0.89   BP/Weight 11/21/2018 10/10/2018 07/19/2018 04/16/2018 03/22/2018 02/17/2018 [id number]  Systolic BP [id number] AB-123456789 AB-123456789 0000000 AB-123456789 AB-123456789 [id number]  Diastolic BP 75 75 80 80 83 78 79  Wt. (Lbs) 200 200 204.08 205 213.12 211 214  BMI 25 25 25.51 25.62 26.64 26.37 26.75   No flowsheet data found.  Updated lab needed at/ before next visit.

## 2018-11-26 NOTE — Assessment & Plan Note (Signed)
Hyperlipidemia:Low fat diet discussed and encouraged.   Lipid Panel  Lab Results  Component Value Date   CHOL 192 01/21/2018   HDL 54 01/21/2018   LDLCALC 129 (H) 01/21/2018   LDLDIRECT 105 08/22/2014   TRIG 45 01/21/2018   CHOLHDL 3.6 01/21/2018  needs to reduce fat intake  Updated lab needed at/ before next visit.

## 2018-12-08 ENCOUNTER — Encounter: Payer: Medicare Other | Admitting: Family Medicine

## 2019-01-27 ENCOUNTER — Other Ambulatory Visit: Payer: Self-pay | Admitting: Family Medicine

## 2019-02-08 DIAGNOSIS — H43813 Vitreous degeneration, bilateral: Secondary | ICD-10-CM | POA: Diagnosis not present

## 2019-02-08 DIAGNOSIS — H3562 Retinal hemorrhage, left eye: Secondary | ICD-10-CM | POA: Diagnosis not present

## 2019-02-08 DIAGNOSIS — H34812 Central retinal vein occlusion, left eye, with macular edema: Secondary | ICD-10-CM | POA: Diagnosis not present

## 2019-02-08 DIAGNOSIS — H35031 Hypertensive retinopathy, right eye: Secondary | ICD-10-CM | POA: Diagnosis not present

## 2019-02-12 ENCOUNTER — Other Ambulatory Visit: Payer: Self-pay

## 2019-02-12 ENCOUNTER — Ambulatory Visit: Payer: Medicare PPO | Attending: Internal Medicine

## 2019-02-12 DIAGNOSIS — Z23 Encounter for immunization: Secondary | ICD-10-CM | POA: Insufficient documentation

## 2019-02-12 NOTE — Progress Notes (Signed)
   Covid-19 Vaccination Clinic  Name:  Matthew Adkins    MRN: [id number] DOB: 1950/04/08  02/12/2019  [name] was observed post Covid-19 immunization for 15 minutes without incidence. He was provided with Vaccine Information Sheet and instruction to access the V-Safe system.   [name] was instructed to call 911 with any severe reactions post vaccine: [place] Kitchen Difficulty breathing  . Swelling of your face and throat  . A fast heartbeat  . A bad rash all over your body  . Dizziness and weakness    Immunizations Administered    Name Date Dose VIS Date Route   Moderna COVID-19 Vaccine 02/12/2019  4:20 PM 0.5 mL 12/06/2018 Intramuscular   Manufacturer: Moderna   Lot: [id number]   [id number]

## 2019-02-17 ENCOUNTER — Other Ambulatory Visit: Payer: Self-pay | Admitting: Family Medicine

## 2019-02-27 ENCOUNTER — Other Ambulatory Visit: Payer: Self-pay | Admitting: Gastroenterology

## 2019-03-15 ENCOUNTER — Ambulatory Visit: Payer: Medicare PPO | Attending: Internal Medicine

## 2019-03-15 ENCOUNTER — Other Ambulatory Visit: Payer: Self-pay

## 2019-03-15 DIAGNOSIS — Z23 Encounter for immunization: Secondary | ICD-10-CM

## 2019-03-15 NOTE — Progress Notes (Signed)
   Covid-19 Vaccination Clinic  Name:  Matthew Adkins    MRN: [id number] DOB: 09-22-1950  03/15/2019  Mr. [name] was observed post Covid-19 immunization for 15 minutes without incident. He was provided with Vaccine Information Sheet and instruction to access the V-Safe system.   Mr. [name] was instructed to call 911 with any severe reactions post vaccine: [place] Kitchen Difficulty breathing  . Swelling of face and throat  . A fast heartbeat  . A bad rash all over body  . Dizziness and weakness   Immunizations Administered    Name Date Dose VIS Date Route   Moderna COVID-19 Vaccine 03/15/2019  1:47 PM 0.5 mL 12/06/2018 Intramuscular   Manufacturer: Moderna   Lot: [id number]   [id number]

## 2019-03-23 ENCOUNTER — Encounter: Payer: Medicare PPO | Admitting: Family Medicine

## 2019-03-27 ENCOUNTER — Other Ambulatory Visit: Payer: Self-pay

## 2019-03-27 ENCOUNTER — Ambulatory Visit (INDEPENDENT_AMBULATORY_CARE_PROVIDER_SITE_OTHER): Payer: Medicare PPO

## 2019-03-27 VITALS — BP 127/75 | Ht 75.0 in | Wt 200.0 lb

## 2019-03-27 DIAGNOSIS — Z0000 Encounter for general adult medical examination without abnormal findings: Secondary | ICD-10-CM | POA: Diagnosis not present

## 2019-03-27 NOTE — Progress Notes (Signed)
Subjective:   Matthew Adkins is a 69 y.o. male who presents for Medicare Annual/Subsequent preventive examination.  Review of Systems:   Cardiac Risk Factors include: advanced age (>71men, >46 women);dyslipidemia;hypertension;male gender;obesity (BMI >30kg/m2);sedentary lifestyle;smoking/ tobacco exposure     Objective:    Vitals: BP 127/75   Ht 6\' 3"  (1.905 m)   Wt 200 lb (90.7 kg)   BMI 25.00 kg/m   Body mass index is 25 kg/m.  Advanced Directives 04/16/2018 03/15/2017 02/09/2017 02/05/2017 02/03/2017 11/14/2016 01/01/2016  Does Patient Have a Medical Advance Directive? No Yes No No No Yes No  Type of Advance Directive - Healthcare Power of Bieber in Chart? - No - copy requested - - - - -  Would patient like information on creating a medical advance directive? - - No - Patient declined No - Patient declined No - Patient declined - Yes (ED - Information included in AVS)    Tobacco Social History   Tobacco Use  Smoking Status Current Every Day Smoker  . Packs/day: 1.00  . Years: 30.00  . Pack years: 30.00  . Types: Cigarettes  Smokeless Tobacco Never Used     Ready to quit: Not Answered Counseling given: Not Answered   Clinical Intake:  Pre-visit preparation completed: Yes  Pain : No/denies pain Pain Score: 0-No pain     Nutritional Status: BMI 25 -29 Overweight Diabetes: No  How often do you need to have someone help you when you read instructions, pamphlets, or other written materials from your doctor or pharmacy?: 1 - Never     Information entered by ::   LPN  Past Medical History:  Diagnosis Date  . Bladder cancer ([place]) 2000  . Depression   . Essential hypertension 01/16/2018  . Nicotine addiction   . Prostate cancer ([place]) 07/06/2014  . Seasonal allergies    Past Surgical History:  Procedure Laterality Date  . athroscopy of right knee  2002  . cancerous polyps  removed from bladder  2000  . COLONOSCOPY  MJ 2009 pmhX: POLYPS   POLYP REMOVED BUT NOT RETRIEVED  . COLONOSCOPY  LS 2007 ARS   ? POLYPS, no path available  . COLONOSCOPY  09/26/2010   sessile poylp(2) pan-colonic diverticulosis,mild/internal hemorrhoids  . COLONOSCOPY WITH PROPOFOL N/A 02/09/2017   Procedure: COLONOSCOPY WITH PROPOFOL;  Surgeon: Danie Binder, MD;  [facility];  Service: Endoscopy;  Laterality: N/A;  8:30am  . ESOPHAGOGASTRODUODENOSCOPY (EGD) WITH PROPOFOL N/A 02/09/2017   Procedure: ESOPHAGOGASTRODUODENOSCOPY (EGD) WITH PROPOFOL;  Surgeon: Danie Binder, MD;  [facility];  Service: Endoscopy;  Laterality: N/A;  . FOOT SURGERY Bilateral 2004  . HERNIA REPAIR  1980's   left inguinal hernia  . LYMPHADENECTOMY Bilateral 09/03/2014   Procedure: PELVIC LYMPHADENECTOMY;  Surgeon: Raynelle Bring, MD;  Location: [facility];  Service: Urology;  Laterality: Bilateral;  . POLYPECTOMY  02/09/2017   Procedure: POLYPECTOMY;  Surgeon: Danie Binder, MD;  [facility];  Service: Endoscopy;;  cecal polyp, hepatic flexure polyps x3, transverse colon polyp, descending colon polyps x2, rectal polyps x2  . PROSTATE BIOPSY  07/06/14  . ROBOT ASSISTED LAPAROSCOPIC RADICAL PROSTATECTOMY N/A 09/03/2014   Procedure: ROBOTIC ASSISTED LAPAROSCOPIC RADICAL PROSTATECTOMY LEVEL 2;  Surgeon: Raynelle Bring, MD;  Location: [facility];  Service: Urology;  Laterality: N/A;   Family History  Problem Relation Age of Onset  . Heart attack  Father   . Diabetes Sister   . Hypertension Sister   . Hypertension Sister   . Aneurysm Sister        brain   . Arthritis Other        family history   . Colon cancer Neg Hx   . Colon polyps Neg Hx    Social History   Socioeconomic History  . Marital status: Married    Spouse name: Not on file  . Number of children: 2  . Years of education: Not on file  . Highest education level: Not on file  Occupational History  . Occupation: unemployed    Tobacco Use  . Smoking status: Current Every Day Smoker    Packs/day: 1.00    Years: 30.00    Pack years: 30.00    Types: Cigarettes  . Smokeless tobacco: Never Used  Substance and Sexual Activity  . Alcohol use: Yes    Comment: 3 pints/month  . Drug use: Yes    Types: Marijuana    Comment: occassionally  . Sexual activity: Yes    Birth control/protection: None  Other Topics Concern  . Not on file  Social History Narrative  . Not on file   Social Determinants of Health   Financial Resource Strain: Low Risk   . Difficulty of Paying Living Expenses: Not very hard  Food Insecurity:   . Worried About Charity fundraiser in the Last Year:   . Arboriculturist in the Last Year:   Transportation Needs:   . Film/video editor (Medical):   Marland Kitchen Lack of Transportation (Non-Medical):   Physical Activity:   . Days of Exercise per Week:   . Minutes of Exercise per Session:   Stress: No Stress Concern Present  . Feeling of Stress : Only a little  Social Connections: Slightly Isolated  . Frequency of Communication with Friends and Family: Twice a week  . Frequency of Social Gatherings with Friends and Family: Once a week  . Attends Religious Services: 1 to 4 times per year  . Active Member of Clubs or Organizations: No  . Attends Archivist Meetings: Never  . Marital Status: Married    Outpatient Encounter Medications as of 03/27/2019  Medication Sig  . acetaminophen (TYLENOL) 500 MG tablet Take 1,000 mg by mouth every 6 (six) hours as needed for mild pain.  Marland Kitchen amLODipine (NORVASC) 5 MG tablet TAKE 1 TABLET BY MOUTH ONCE A DAY.  Marland Kitchen buPROPion (WELLBUTRIN SR) 150 MG 12 hr tablet Take 1 tablet (150 mg total) by mouth 2 (two) times daily.  . cyclobenzaprine (FLEXERIL) 10 MG tablet Take 1 tablet (10 mg total) by mouth at bedtime.  Marland Kitchen FLUoxetine (PROZAC) 20 MG capsule TAKE 1 CAPSULE BY MOUTH DAILY.  . Multiple Vitamins-Minerals (ONE DAILY MENS 50+ MULTIVIT PO) Take 1 tablet by  mouth daily.  Marland Kitchen omeprazole (PRILOSEC) 20 MG capsule TAKE 1 CAPSULE BY MOUTH 30 MINUTES PRIOR TO BREAKFAST.   No facility-administered encounter medications on file as of 03/27/2019.    Activities of Daily Living In your present state of health, do you have any difficulty performing the following activities: 03/27/2019 07/19/2018  Hearing? N N  Vision? N N  Difficulty concentrating or making decisions? N N  Walking or climbing stairs? Y N  Dressing or bathing? N N  Doing errands, shopping? N N  Preparing Food and eating ? N -  Using the Toilet? N -  In the past six months,  have you accidently leaked urine? N -  Do you have problems with loss of bowel control? N -  Managing your Medications? N -  Managing your Finances? N -  Housekeeping or managing your Housekeeping? N -  Some recent data might be hidden    Patient Care Team: Fayrene Helper, MD as PCP - General Danie Binder, MD (Gastroenterology)   Assessment:   This is a routine wellness examination for Demauri.  Exercise Activities and Dietary recommendations Current Exercise Habits: The patient does not participate in regular exercise at present, Exercise limited by: orthopedic condition(s)  Goals    . Exercise 3x per week (30 min per time)    . LIFESTYLE - DECREASE FALLS RISK    . Quit smoking / using tobacco     patient would like to quit smoking- has cut back recently        Fall Risk Fall Risk  03/27/2019 11/21/2018 07/19/2018 04/21/2018 03/22/2018  Falls in the past year? 0 0 1 0 0  Number falls in past yr: 0 0 1 0 0  Injury with Fall? 0 0 0 0 0   Is the patient's home free of loose throw rugs in walkways, pet beds, electrical cords, etc?   yes      Grab bars in the bathroom? yes      Handrails on the stairs?   yes      Adequate lighting?   yes  Timed Get Up and Go Performed: done on the phone   Depression Screen PHQ 2/9 Scores 11/21/2018 07/19/2018 03/22/2018 02/17/2018  PHQ - 2 Score 0 0 2 1  PHQ- 9  Score 0 - 4 5    Cognitive Function     6CIT Screen 03/27/2019 07/19/2018 03/22/2018 03/22/2018 03/15/2017  What Year? 0 points 0 points 0 points 0 points 0 points  What month? 0 points 0 points 0 points 0 points 0 points  What time? 0 points 0 points 0 points 0 points 0 points  Count back from 20 0 points 0 points 0 points 0 points 0 points  Months in reverse 0 points 0 points 0 points 0 points 0 points  Repeat phrase 0 points 0 points 0 points 2 points 0 points  Total Score 0 0 0 2 0    Immunization History  Administered Date(s) Administered  . Fluad Quad(high Dose 65+) 10/03/2018  . Influenza Split 09/20/2010, 10/06/2011  . Influenza Whole 10/02/2008, 09/19/2009  . Influenza, High Dose Seasonal PF 11/05/2017  . Influenza,inj,Quad PF,6+ Mos 09/19/2014, 10/23/2015, 10/07/2016  . Moderna SARS-COVID-2 Vaccination 02/12/2019, 03/15/2019  . Pneumococcal Conjugate-13 05/30/2014  . Pneumococcal Polysaccharide-23 10/23/2015  . Td 10/21/2009  . Zoster 06/25/2010    Qualifies for Shingles Vaccine? yes  Screening Tests Health Maintenance  Topic Date Due  . TETANUS/TDAP  10/22/2019  . COLONOSCOPY  02/10/2027  . INFLUENZA VACCINE  Completed  . Hepatitis C Screening  Completed  . PNA vac Low Risk Adult  Completed   Cancer Screenings: Lung: Low Dose CT Chest recommended if Age 80-80 years, 30 pack-year currently smoking OR have quit w/in 15years. Patient does qualify. Colorectal: completed  Additional Screenings:  Hepatitis C Screening: completed       Plan:     I have personally reviewed and noted the following in the patient's chart:   . Medical and social history . Use of alcohol, tobacco or illicit drugs  . Current medications and supplements . Functional ability and status . Nutritional  status . Physical activity . Advanced directives . List of other physicians . Hospitalizations, surgeries, and ER visits in previous 12 months . Vitals . Screenings to include  cognitive, depression, and falls . Referrals and appointments  In addition, I have reviewed and discussed with patient certain preventive protocols, quality metrics, and best practice recommendations. A written personalized care plan for preventive services as well as general preventive health recommendations were provided to patient.     Kate Sable, LPN, LPN  [id number]

## 2019-03-27 NOTE — Patient Instructions (Signed)
Mr. Matthew Adkins , Thank you for taking time to come for your Medicare Wellness Visit. I appreciate your ongoing commitment to your health goals. Please review the following plan we discussed and let me know if I can assist you in the future.   Screening recommendations/referrals: Colonoscopy: due 2029 Recommended yearly ophthalmology/optometry visit for glaucoma screening and checkup Recommended yearly dental visit for hygiene and checkup  Vaccinations: Influenza vaccine: complete  Pneumococcal vaccine: completed  Tdap vaccine: due 10/2019 Shingles vaccine: due- will check with pharmacy    Advanced directives:   Conditions/risks identified: increase physical activity, decrease fall risk   Next appointment:   Preventive Care 12 Years and Older, Male Preventive care refers to lifestyle choices and visits with your health care provider that can promote health and wellness. What does preventive care include?  A yearly physical exam. This is also called an annual well check.  Dental exams once or twice a year.  Routine eye exams. Ask your health care provider how often you should have your eyes checked.  Personal lifestyle choices, including:  Daily care of your teeth and gums.  Regular physical activity.  Eating a healthy diet.  Avoiding tobacco and drug use.  Limiting alcohol use.  Practicing safe sex.  Taking low doses of aspirin every day.  Taking vitamin and mineral supplements as recommended by your health care provider. What happens during an annual well check? The services and screenings done by your health care provider during your annual well check will depend on your age, overall health, lifestyle risk factors, and family history of disease. Counseling  Your health care provider may ask you questions about your:  Alcohol use.  Tobacco use.  Drug use.  Emotional well-being.  Home and relationship well-being.  Sexual activity.  Eating habits.  History  of falls.  Memory and ability to understand (cognition).  Work and work Statistician. Screening  You may have the following tests or measurements:  Height, weight, and BMI.  Blood pressure.  Lipid and cholesterol levels. These may be checked every 5 years, or more frequently if you are over 6 years old.  Skin check.  Lung cancer screening. You may have this screening every year starting at age 32 if you have a 30-pack-year history of smoking and currently smoke or have quit within the past 15 years.  Fecal occult blood test (FOBT) of the stool. You may have this test every year starting at age 104.  Flexible sigmoidoscopy or colonoscopy. You may have a sigmoidoscopy every 5 years or a colonoscopy every 10 years starting at age 72.  Prostate cancer screening. Recommendations will vary depending on your family history and other risks.  Hepatitis C blood test.  Hepatitis B blood test.  Sexually transmitted disease (STD) testing.  Diabetes screening. This is done by checking your blood sugar (glucose) after you have not eaten for a while (fasting). You may have this done every 1-3 years.  Abdominal aortic aneurysm (AAA) screening. You may need this if you are a current or former smoker.  Osteoporosis. You may be screened starting at age 50 if you are at high risk. Talk with your health care provider about your test results, treatment options, and if necessary, the need for more tests. Vaccines  Your health care provider may recommend certain vaccines, such as:  Influenza vaccine. This is recommended every year.  Tetanus, diphtheria, and acellular pertussis (Tdap, Td) vaccine. You may need a Td booster every 10 years.  Zoster vaccine. You  may need this after age 68.  Pneumococcal 13-valent conjugate (PCV13) vaccine. One dose is recommended after age 44.  Pneumococcal polysaccharide (PPSV23) vaccine. One dose is recommended after age 9. Talk to your health care provider  about which screenings and vaccines you need and how often you need them. This information is not intended to replace advice given to you by your health care provider. Make sure you discuss any questions you have with your health care provider. Document Released: 01/18/2015 Document Revised: 09/11/2015 Document Reviewed: 10/23/2014 Elsevier Interactive Patient Education  2017 [employer] Prevention in the Home Falls can cause injuries. They can happen to people of all ages. There are many things you can do to make your home safe and to help prevent falls. What can I do on the outside of my home?  Regularly fix the edges of walkways and driveways and fix any cracks.  Remove anything that might make you trip as you walk through a door, such as a raised step or threshold.  Trim any bushes or trees on the path to your home.  Use bright outdoor lighting.  Clear any walking paths of anything that might make someone trip, such as rocks or tools.  Regularly check to see if handrails are loose or broken. Make sure that both sides of any steps have handrails.  Any raised decks and porches should have guardrails on the edges.  Have any leaves, snow, or ice cleared regularly.  Use sand or salt on walking paths during winter.  Clean up any spills in your garage right away. This includes oil or grease spills. What can I do in the bathroom?  Use night lights.  Install grab bars by the toilet and in the tub and shower. Do not use towel bars as grab bars.  Use non-skid mats or decals in the tub or shower.  If you need to sit down in the shower, use a plastic, non-slip stool.  Keep the floor dry. Clean up any water that spills on the floor as soon as it happens.  Remove soap buildup in the tub or shower regularly.  Attach bath mats securely with double-sided non-slip rug tape.  Do not have throw rugs and other things on the floor that can make you trip. What can I do in the  bedroom?  Use night lights.  Make sure that you have a light by your bed that is easy to reach.  Do not use any sheets or blankets that are too big for your bed. They should not hang down onto the floor.  Have a firm chair that has side arms. You can use this for support while you get dressed.  Do not have throw rugs and other things on the floor that can make you trip. What can I do in the kitchen?  Clean up any spills right away.  Avoid walking on wet floors.  Keep items that you use a lot in easy-to-reach places.  If you need to reach something above you, use a strong step stool that has a grab bar.  Keep electrical cords out of the way.  Do not use floor polish or wax that makes floors slippery. If you must use wax, use non-skid floor wax.  Do not have throw rugs and other things on the floor that can make you trip. What can I do with my stairs?  Do not leave any items on the stairs.  Make sure that there are handrails on both  sides of the stairs and use them. Fix handrails that are broken or loose. Make sure that handrails are as long as the stairways.  Check any carpeting to make sure that it is firmly attached to the stairs. Fix any carpet that is loose or worn.  Avoid having throw rugs at the top or bottom of the stairs. If you do have throw rugs, attach them to the floor with carpet tape.  Make sure that you have a light switch at the top of the stairs and the bottom of the stairs. If you do not have them, ask someone to add them for you. What else can I do to help prevent falls?  Wear shoes that:  Do not have high heels.  Have rubber bottoms.  Are comfortable and fit you well.  Are closed at the toe. Do not wear sandals.  If you use a stepladder:  Make sure that it is fully opened. Do not climb a closed stepladder.  Make sure that both sides of the stepladder are locked into place.  Ask someone to hold it for you, if possible.  Clearly mark and make  sure that you can see:  Any grab bars or handrails.  First and last steps.  Where the edge of each step is.  Use tools that help you move around (mobility aids) if they are needed. These include:  Canes.  Walkers.  Scooters.  Crutches.  Turn on the lights when you go into a dark area. Replace any light bulbs as soon as they burn out.  Set up your furniture so you have a clear path. Avoid moving your furniture around.  If any of your floors are uneven, fix them.  If there are any pets around you, be aware of where they are.  Review your medicines with your doctor. Some medicines can make you feel dizzy. This can increase your chance of falling. Ask your doctor what other things that you can do to help prevent falls. This information is not intended to replace advice given to you by your health care provider. Make sure you discuss any questions you have with your health care provider. Document Released: 10/18/2008 Document Revised: 05/30/2015 Document Reviewed: 01/26/2014 Elsevier Interactive Patient Education  2017 [employer].

## 2019-04-24 ENCOUNTER — Other Ambulatory Visit: Payer: Self-pay | Admitting: Family Medicine

## 2019-05-19 ENCOUNTER — Telehealth: Payer: Self-pay | Admitting: *Deleted

## 2019-05-19 ENCOUNTER — Other Ambulatory Visit: Payer: Self-pay | Admitting: Family Medicine

## 2019-05-19 DIAGNOSIS — J069 Acute upper respiratory infection, unspecified: Secondary | ICD-10-CM

## 2019-05-19 MED ORDER — GUAIFENESIN ER 600 MG PO TB12: 600.0000 mg | ORAL_TABLET | Freq: Two times a day (BID) | ORAL | 0 refills | Status: AC

## 2019-05-19 MED ORDER — DEXTROMETHORPHAN POLISTIREX ER 30 MG/5ML PO SUER: 15.0000 mg | Freq: Two times a day (BID) | ORAL | 0 refills | Status: DC

## 2019-05-19 NOTE — Telephone Encounter (Signed)
That is tough-is he short of breath? If so, I rather he see someone. If he is just coughing, I can send in some Mucinex cough syrup. If not better by Monday he would need to go be seen.

## 2019-05-19 NOTE — Telephone Encounter (Signed)
Pt is calling there are no openings on the schedule he feels like he has bronchitis. He has tried Orthoptist with no relief. I advised pt to go to urgent care however he takes care of his wife and her sister who cannot be left alone. He wanted to know if something could be called in as he would go to the urgent care but he has no one to sit with the wife and sister and he feels bad.

## 2019-05-19 NOTE — Telephone Encounter (Signed)
Pt is not short of breath he is just really congested and coughing willing to try the cough syrup if this can be sent in and advised if no better by Monday to go to urgent care with verbal understanding

## 2019-05-19 NOTE — Telephone Encounter (Signed)
Noted, I have sent in Mucinex tablets and a cough syrup.

## 2019-05-22 NOTE — Telephone Encounter (Signed)
Patient aware.

## 2019-05-23 ENCOUNTER — Other Ambulatory Visit: Payer: Self-pay | Admitting: Family Medicine

## 2019-05-23 ENCOUNTER — Other Ambulatory Visit: Payer: Self-pay | Admitting: Nurse Practitioner

## 2019-06-23 ENCOUNTER — Emergency Department (HOSPITAL_COMMUNITY)
Admission: EM | Admit: 2019-06-23 | Discharge: 2019-06-23 | Disposition: A | Discharge status: Home / Self Care | Payer: Medicare PPO | Attending: Emergency Medicine | Admitting: Emergency Medicine

## 2019-06-23 ENCOUNTER — Emergency Department (HOSPITAL_COMMUNITY): Payer: Medicare PPO

## 2019-06-23 ENCOUNTER — Other Ambulatory Visit: Payer: Self-pay

## 2019-06-23 ENCOUNTER — Encounter (HOSPITAL_COMMUNITY): Payer: Self-pay | Admitting: *Deleted

## 2019-06-23 DIAGNOSIS — M542 Cervicalgia: Secondary | ICD-10-CM | POA: Diagnosis not present

## 2019-06-23 DIAGNOSIS — Z885 Allergy status to narcotic agent status: Secondary | ICD-10-CM | POA: Diagnosis not present

## 2019-06-23 DIAGNOSIS — R519 Headache, unspecified: Secondary | ICD-10-CM | POA: Diagnosis not present

## 2019-06-23 DIAGNOSIS — M4722 Other spondylosis with radiculopathy, cervical region: Secondary | ICD-10-CM | POA: Diagnosis not present

## 2019-06-23 DIAGNOSIS — M5412 Radiculopathy, cervical region: Secondary | ICD-10-CM

## 2019-06-23 DIAGNOSIS — Z8546 Personal history of malignant neoplasm of prostate: Secondary | ICD-10-CM | POA: Insufficient documentation

## 2019-06-23 DIAGNOSIS — F17210 Nicotine dependence, cigarettes, uncomplicated: Secondary | ICD-10-CM | POA: Insufficient documentation

## 2019-06-23 DIAGNOSIS — I10 Essential (primary) hypertension: Secondary | ICD-10-CM | POA: Diagnosis not present

## 2019-06-23 DIAGNOSIS — Z79899 Other long term (current) drug therapy: Secondary | ICD-10-CM | POA: Diagnosis not present

## 2019-06-23 MED ORDER — PREDNISONE 20 MG PO TABS
20.0000 mg | ORAL_TABLET | Freq: Two times a day (BID) | ORAL | 0 refills | Status: DC
Start: 2019-06-23 — End: 2019-08-08

## 2019-06-23 MED ORDER — DIAZEPAM 2 MG PO TABS: 2.0000 mg | ORAL_TABLET | Freq: Four times a day (QID) | ORAL | 0 refills | Status: DC | PRN

## 2019-06-23 MED ORDER — HYDROCODONE-ACETAMINOPHEN 5-325 MG PO TABS: 1.0000 | ORAL_TABLET | Freq: Four times a day (QID) | ORAL | 0 refills | Status: DC | PRN

## 2019-06-23 NOTE — ED Triage Notes (Signed)
Headache for the past 2 days right side

## 2019-06-23 NOTE — Discharge Instructions (Signed)
The pain in your neck, head and right shoulder, or from arthritis in your neck.  We are treating you with 3 different medications which have been sent to your pharmacy.  Also use heat on the sore area 3 or 4 times a day to help with the discomfort.  Follow-up with your doctor if you are not getting better in a few days or have ongoing problems.  Do not drive after taking the diazepam or hydrocodone.  You need to wait at least 6 hours following taking either these medicines before driving.

## 2019-06-23 NOTE — ED Notes (Signed)
To CT

## 2019-06-23 NOTE — ED Provider Notes (Signed)
[facility] EMERGENCY DEPARTMENT Provider Note   CSN: [id number] Arrival date & time: 06/23/19  1448     History Chief Complaint  Patient presents with  . Headache    Matthew Adkins is a 69 y.o. male.  HPI Reports headache for 3 days, with neck pain and right shoulder pain.  No trauma.  No similar problems in the past.  He denies fever, chills, nausea, vomiting.  He drove his own vehicle here for evaluation.    Past Medical History:  Diagnosis Date  . Bladder cancer ([place]) 2000  . Depression   . Essential hypertension 01/16/2018  . Nicotine addiction   . Prostate cancer ([place]) 07/06/2014  . Seasonal allergies     Patient Active Problem List   Diagnosis Date Noted  . Headache 02/17/2018  . Essential hypertension 01/16/2018  . GERD (gastroesophageal reflux disease) 07/13/2017  . H/O prostate cancer 07/11/2017  . Heme + stool 01/04/2017  . Tubular adenoma of colon 11/18/2016  . Left shoulder pain 10/30/2015  . Prostate cancer ([place]) 07/25/2014  . Hemorrhoids, internal 09/30/2011  . Arthritis of knee, degenerative 09/30/2011  . Degenerative disc disease, lumbar 09/30/2011  . Pulmonary nodule 09/21/2011  . 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 SKIN TAG 10/21/2009  . Allergic rhinitis 09/19/2009  . Dyslipidemia 08/17/2008  . Prediabetes 10/14/2007  . BLADDER CANCER 04/13/2007  . NICOTINE ADDICTION 04/13/2007    Past Surgical History:  Procedure Laterality Date  . athroscopy of right knee  2002  . cancerous polyps removed from bladder  2000  . COLONOSCOPY  MJ 2009 pmhX: POLYPS   POLYP REMOVED BUT NOT RETRIEVED  . COLONOSCOPY  LS 2007 ARS   ? POLYPS, no path available  . COLONOSCOPY  09/26/2010   sessile poylp(2) pan-colonic diverticulosis,mild/internal hemorrhoids  . COLONOSCOPY WITH PROPOFOL N/A 02/09/2017   Procedure: COLONOSCOPY WITH PROPOFOL;  Surgeon: Danie Binder, MD;  Location: [facility];  Service: Endoscopy;   Laterality: N/A;  8:30am  . ESOPHAGOGASTRODUODENOSCOPY (EGD) WITH PROPOFOL N/A 02/09/2017   Procedure: ESOPHAGOGASTRODUODENOSCOPY (EGD) WITH PROPOFOL;  Surgeon: Danie Binder, MD;  Location: [facility];  Service: Endoscopy;  Laterality: N/A;  . FOOT SURGERY Bilateral 2004  . HERNIA REPAIR  1980's   left inguinal hernia  . LYMPHADENECTOMY Bilateral 09/03/2014   Procedure: PELVIC LYMPHADENECTOMY;  Surgeon: Raynelle Bring, MD;  Location: [facility] ORS;  Service: Urology;  Laterality: Bilateral;  . POLYPECTOMY  02/09/2017   Procedure: POLYPECTOMY;  Surgeon: Danie Binder, MD;  Location: [facility];  Service: Endoscopy;;  cecal polyp, hepatic flexure polyps x3, transverse colon polyp, descending colon polyps x2, rectal polyps x2  . PROSTATE BIOPSY  07/06/14  . ROBOT ASSISTED LAPAROSCOPIC RADICAL PROSTATECTOMY N/A 09/03/2014   Procedure: ROBOTIC ASSISTED LAPAROSCOPIC RADICAL PROSTATECTOMY LEVEL 2;  Surgeon: Raynelle Bring, MD;  Location: [facility] OR[facility];  Service: Urology;  Laterality: N/A;       Family History  Problem Relation Age of Onset  . Heart attack Father   . Diabetes Sister   . Hypertension Sister   . Hypertension Sister   . Aneurysm Sister        brain   . Arthritis Other        family history   . Colon cancer Neg Hx   . Colon polyps Neg Hx     Social History   Tobacco Use  . Smoking status: Current Every Day Smoker    Packs/day: 1.00  Years: 30.00    Pack years: 30.00    Types: Cigarettes  . Smokeless tobacco: Never Used  Vaping Use  . Vaping Use: Never used  Substance Use Topics  . Alcohol use: Yes    Comment: 3 pints/month  . Drug use: Yes    Types: Marijuana    Comment: occassionally    Home Medications Prior to Admission medications   Medication Sig Start Date End Date Taking? Authorizing Provider  acetaminophen (TYLENOL) 500 MG tablet Take 1,000 mg by mouth every 6 (six) hours as needed for mild pain.    [provider]  amLODipine (NORVASC) 5 MG tablet  TAKE 1 TABLET BY MOUTH ONCE A DAY. 05/23/19   Fayrene Helper, MD  buPROPion [facility] SR) 150 MG 12 hr tablet Take 1 tablet (150 mg total) by mouth 2 (two) times daily. 11/21/18   Fayrene Helper, MD  cyclobenzaprine (FLEXERIL) 10 MG tablet Take 1 tablet (10 mg total) by mouth at bedtime. 02/17/18   Fayrene Helper, MD  dextromethorphan (DELSYM) 30 MG/5ML liquid Take 2.5 mLs (15 mg total) by mouth 2 (two) times daily. 05/19/19   Perlie Mayo, NP  diazepam (VALIUM) 2 MG tablet Take 1 tablet (2 mg total) by mouth every 6 (six) hours as needed for muscle spasms. 06/23/19   Daleen Bo, MD  FLUoxetine (PROZAC) 20 MG capsule TAKE 1 CAPSULE BY MOUTH DAILY. 02/20/19   Fayrene Helper, MD  HYDROcodone-acetaminophen (NORCO) 5-325 MG tablet Take 1 tablet by mouth every 6 (six) hours as needed for moderate pain. 06/23/19   Daleen Bo, MD  Multiple Vitamins-Minerals (ONE DAILY MENS 50+ MULTIVIT PO) Take 1 tablet by mouth daily.    [provider]  omeprazole (PRILOSEC) 20 MG capsule TAKE 1 CAPSULE BY MOUTH 30 MINUTES PRIOR TO BREAKFAST. 05/25/19   Carlis Stable, NP  predniSONE (DELTASONE) 20 MG tablet Take 1 tablet (20 mg total) by mouth 2 (two) times daily. 06/23/19   Daleen Bo, MD    Allergies    Bee venom and Codeine  Review of Systems   Review of Systems  All other systems reviewed and are negative.   Physical Exam Updated Vital Signs BP (!) 141/93 (BP Location: Right Arm)   Pulse (!) 52   Temp 98.5 F (36.9 C)   Resp 18   Ht 6\' 3"  (1.905 m)   Wt 93 kg   SpO2 99%   BMI 25.62 kg/m   Physical Exam Vitals and nursing note reviewed.  Constitutional:      General: He is not in acute distress.    Appearance: He is well-developed. He is not ill-appearing, toxic-appearing or diaphoretic.  HENT:     Head: Normocephalic and atraumatic.     Right Ear: External ear normal.     Left Ear: External ear normal.  Eyes:     Conjunctiva/sclera: Conjunctivae normal.       Pupils: Pupils are equal, round, and reactive to light.  Neck:     Trachea: Phonation normal.  Cardiovascular:     Rate and Rhythm: Normal rate.  Pulmonary:     Effort: Pulmonary effort is normal.  Abdominal:     General: There is no distension.  Musculoskeletal:        General: Normal range of motion.     Cervical back: Normal range of motion and neck supple.     Comments: Normal gait.  Mild tenderness right lateral neck and right shoulder.  No altered  motion of arms or legs.  Skin:    General: Skin is warm and dry.  Neurological:     Mental Status: He is alert and oriented to person, place, and time.     Cranial Nerves: No cranial nerve deficit.     Sensory: No sensory deficit.     Motor: No abnormal muscle tone.     Coordination: Coordination normal.  Psychiatric:        Mood and Affect: Mood normal.        Behavior: Behavior normal.        Thought Content: Thought content normal.        Judgment: Judgment normal.     ED Results / Procedures / Treatments   Labs (all labs ordered are listed, but only abnormal results are displayed) Labs Reviewed - No data to display  EKG None  Radiology CT Head Wo Contrast  Result Date: 06/23/2019 CLINICAL DATA:  Right-sided headache EXAM: CT HEAD WITHOUT CONTRAST TECHNIQUE: Contiguous axial images were obtained from the base of the skull through the vertex without intravenous contrast. COMPARISON:  CT brain 04/16/2018 FINDINGS: Brain: No acute territorial infarction, hemorrhage or intracranial mass. The ventricles are nonenlarged. Vascular: No hyperdense vessels. Scattered carotid vascular calcification Skull: Normal. Negative for fracture or focal lesion. Sinuses/Orbits: Old appearing fracture of the medial wall left orbit. Sinuses are clear Other: None IMPRESSION: No CT evidence for acute intracranial abnormality. Electronically Signed   By: Donavan Foil M.D.   On: 06/23/2019 16:57   CT Cervical Spine Wo Contrast  Result Date:  06/23/2019 CLINICAL DATA:  Neck pain right-sided headache EXAM: CT CERVICAL SPINE WITHOUT CONTRAST TECHNIQUE: Multidetector CT imaging of the cervical spine was performed without intravenous contrast. Multiplanar CT image reconstructions were also generated. COMPARISON:  Radiograph 02/17/2018 FINDINGS: Alignment: Normal. Skull base and vertebrae: No acute fracture. No primary bone lesion or focal pathologic process. Soft tissues and spinal canal: No prevertebral fluid or swelling. No visible canal hematoma. Disc levels: Mild anterior osteophytes C4-C5, C5-C6 and C6-C7. Mild to moderate disc space narrowing at C6-C7. Moderate bilateral foraminal narrowing at C3-C4. Mild left foraminal narrowing at C6-C7. Upper chest: Emphysema Other: None IMPRESSION: 1. Degenerative changes of the cervical spine. No acute osseous abnormality. 2. Emphysema. Emphysema (ICD10-J43.9). Electronically Signed   By: Donavan Foil M.D.   On: 06/23/2019 17:02    Procedures Procedures (including critical care time)  Medications Ordered in ED Medications - No data to display  ED Course  I have reviewed the triage vital signs and the nursing notes.  Pertinent labs & imaging results that were available during my care of the patient were reviewed by me and considered in my medical decision making (see chart for details).    MDM Rules/Calculators/A&P                           Patient Vitals for the past 24 hrs:  BP Temp Pulse Resp SpO2 Height Weight  06/23/19 1712 (!) 141/93 -- (!) 52 18 99 % -- --  06/23/19 1522 -- -- -- -- -- 6\' 3"  (1.905 m) 93 kg  06/23/19 1519 (!) 146/92 98.5 F (36.9 C) (!) 54 18 98 % -- --    5:35 PM Reevaluation with update and discussion. After initial assessment and treatment, an updated evaluation reveals no change in status, findings discussed with the patient and all questions were answered. Daleen Bo   Medical Decision Making:  This patient  is presenting for evaluation of neck pain,  shoulder pain and headache, which does require a range of treatment options, and is a complaint that involves a moderate risk of morbidity and mortality. The differential diagnoses include radiculopathy, fracture, intracranial bleeding. I decided to review old records, and in summary healthy elderly man without traumatic injury.  I did not require additional historical information from anyone.   Radiologic Tests Ordered, included CT head and CT cervical spine.  I independently Visualized: CT images, which show degenerative change cervical spine consistent with source of pain for clinical presentation.  CT head is negative for acute abnormality    Critical Interventions-clinical evaluation, CT imaging ordered, observation reassessment  After These Interventions, the Patient was reevaluated and was found stable for discharge.  Suspect cervical radiculopathy manifesting in neck pain, shoulder pain and headache.  Doubt cervical myelopathy.  CRITICAL CARE-no Performed by: Daleen Bo  Nursing Notes Reviewed/ Care Coordinated Applicable Imaging Reviewed Interpretation of Laboratory Data incorporated into ED treatment  The patient appears reasonably screened and/or stabilized for discharge and I doubt any other medical condition or other [facility] requiring further screening, evaluation, or treatment in the ED at this time prior to discharge.  Plan: Home Medications-continue usual medications, driving precaution for sedating medication; Home Treatments-heat to affected area; return here if the recommended treatment, does not improve the symptoms; Recommended follow up-PCP, as needed     Final Clinical Impression(s) / ED Diagnoses Final diagnoses:  Cervical radiculopathy  Osteoarthritis of spine with radiculopathy, cervical region    Rx / DC Orders ED Discharge Orders         Ordered    HYDROcodone-acetaminophen (NORCO) 5-325 MG tablet  Every 6 hours PRN     Discontinue  Reprint      06/23/19 1732    diazepam (VALIUM) 2 MG tablet  Every 6 hours PRN     Discontinue  Reprint     06/23/19 1732    predniSONE (DELTASONE) 20 MG tablet  2 times daily     Discontinue  Reprint     06/23/19 1732           Daleen Bo, MD 06/23/19 1737

## 2019-06-23 NOTE — ED Notes (Signed)
From CT 

## 2019-06-23 NOTE — ED Notes (Signed)
Pt complains of R sided HA for the last 2 days   Took tylenol last night with relief but none since then   Ambulates heel to toe  Non photophobic   Awaiting eval

## 2019-08-08 ENCOUNTER — Ambulatory Visit (INDEPENDENT_AMBULATORY_CARE_PROVIDER_SITE_OTHER): Payer: Medicare PPO | Admitting: Orthopedic Surgery

## 2019-08-08 ENCOUNTER — Other Ambulatory Visit: Payer: Self-pay

## 2019-08-08 ENCOUNTER — Encounter: Payer: Self-pay | Admitting: Orthopedic Surgery

## 2019-08-08 ENCOUNTER — Ambulatory Visit: Payer: Medicare PPO

## 2019-08-08 VITALS — BP 141/79 | HR 68

## 2019-08-08 DIAGNOSIS — M1711 Unilateral primary osteoarthritis, right knee: Secondary | ICD-10-CM

## 2019-08-08 DIAGNOSIS — G8929 Other chronic pain: Secondary | ICD-10-CM | POA: Diagnosis not present

## 2019-08-08 DIAGNOSIS — M1712 Unilateral primary osteoarthritis, left knee: Secondary | ICD-10-CM

## 2019-08-08 DIAGNOSIS — M25561 Pain in right knee: Secondary | ICD-10-CM

## 2019-08-08 DIAGNOSIS — M1710 Unilateral primary osteoarthritis, unspecified knee: Secondary | ICD-10-CM

## 2019-08-08 MED ORDER — HYDROCODONE-ACETAMINOPHEN 5-325 MG PO TABS: 1.0000 | ORAL_TABLET | Freq: Four times a day (QID) | ORAL | 0 refills | Status: DC | PRN

## 2019-08-08 NOTE — Progress Notes (Signed)
Progress Note   Patient ID: Matthew Adkins, male   DOB: 09-Nov-1950, 69 y.o.   MRN: [id number]  There is no height or weight on file to calculate BMI.  Chief Complaint  Patient presents with  . Knee Pain    bilateral     Encounter Diagnoses  Name Primary?  Marland Kitchen Arthritis of knee Yes  . Bilateral chronic knee pain     HPI 69 year old male with bilateral knee arthritis comes in for his yearly checkups.  He has mild discomfort usually and occasional increase in pain depending on his activity level and the weather  Currently takes Tylenol usually for pain and then occasionally will take some hydrocodone  ROS  No chest pain shortness of breath numbness or tingling or back pain    BP (!) 141/79   Pulse 68   Physical Exam Constitutional:      General: He is not in acute distress.    Appearance: He is well-developed.  Cardiovascular:     Comments: No peripheral edema Musculoskeletal:     Comments: Both knees are in varus but still move very well slight flexion contractures right greater than left both left less than 5 degrees  Knees are stable medial and lateral joint lines are minimally tender.  No effusions are noted.  Neurovascular exam is intact  Skin:    General: Skin is warm and dry.  Neurological:     Mental Status: He is alert and oriented to person, place, and time.     Sensory: No sensory deficit.     Coordination: Coordination normal.     Gait: Gait normal.     Deep Tendon Reflexes: Reflexes are normal and symmetric.      MEDICAL DECISION MAKING Encounter Diagnoses  Name Primary?  Marland Kitchen Arthritis of knee Yes  . Bilateral chronic knee pain     DATA ANALYSED:  IMAGING: Independent interpretation of images: Bilateral knee films show severe arthritis with mild to moderate varus deformity see report x-rays were done in-house Orders: No new orders  Outside records reviewed:   No outside records  C. MANAGEMENT   Yearly follow-up and medication  management    Meds ordered this encounter  Medications  . HYDROcodone-acetaminophen (NORCO) 5-325 MG tablet    Sig: Take 1 tablet by mouth every 6 (six) hours as needed for moderate pain.    Dispense:  15 tablet    Refill:  0    Arther Abbott, MD 08/08/2019 5:16 PM

## 2019-08-14 ENCOUNTER — Other Ambulatory Visit: Payer: Self-pay

## 2019-08-14 DIAGNOSIS — E785 Hyperlipidemia, unspecified: Secondary | ICD-10-CM

## 2019-08-14 DIAGNOSIS — I10 Essential (primary) hypertension: Secondary | ICD-10-CM

## 2019-08-14 DIAGNOSIS — E559 Vitamin D deficiency, unspecified: Secondary | ICD-10-CM

## 2019-08-18 DIAGNOSIS — E559 Vitamin D deficiency, unspecified: Secondary | ICD-10-CM | POA: Diagnosis not present

## 2019-08-18 DIAGNOSIS — I10 Essential (primary) hypertension: Secondary | ICD-10-CM | POA: Diagnosis not present

## 2019-08-18 DIAGNOSIS — E785 Hyperlipidemia, unspecified: Secondary | ICD-10-CM | POA: Diagnosis not present

## 2019-08-19 LAB — CMP14+EGFR
ALT: 11 IU/L (ref 0–44)
AST: 16 IU/L (ref 0–40)
Albumin/Globulin Ratio: 1.5 (ref 1.2–2.2)
Albumin: 4 g/dL (ref 3.8–4.8)
Alkaline Phosphatase: 71 IU/L (ref 48–121)
BUN/Creatinine Ratio: 11 (ref 10–24)
BUN: 11 mg/dL (ref 8–27)
Bilirubin Total: 0.6 mg/dL (ref 0.0–1.2)
CO2: 24 mmol/L (ref 20–29)
Calcium: 8.7 mg/dL (ref 8.6–10.2)
Chloride: 103 mmol/L (ref 96–106)
Creatinine, Ser: 1.01 mg/dL (ref 0.76–1.27)
GFR calc Af Amer: 87 mL/min/{1.73_m2} (ref >59)
GFR calc non Af Amer: 75 mL/min/{1.73_m2} (ref >59)
Globulin, Total: 2.6 g/dL (ref 1.5–4.5)
Glucose: 106 mg/dL — ABNORMAL HIGH (ref 65–99)
Potassium: 4 mmol/L (ref 3.5–5.2)
Sodium: 141 mmol/L (ref 134–144)
Total Protein: 6.6 g/dL (ref 6.0–8.5)

## 2019-08-19 LAB — VITAMIN D 25 HYDROXY (VIT D DEFICIENCY, FRACTURES): Vit D, 25-Hydroxy: 15.7 ng/mL — ABNORMAL LOW (ref 30.0–100.0)

## 2019-08-19 LAB — LIPID PANEL
Chol/HDL Ratio: 3.5 ratio (ref 0.0–5.0)
Cholesterol, Total: 179 mg/dL (ref 100–199)
HDL: 51 mg/dL (ref >39)
LDL Chol Calc (NIH): 117 mg/dL — ABNORMAL HIGH (ref 0–99)
Triglycerides: 58 mg/dL (ref 0–149)
VLDL Cholesterol Cal: 11 mg/dL (ref 5–40)

## 2019-08-19 LAB — TSH: TSH: 0.933 u[IU]/mL (ref 0.450–4.500)

## 2019-08-21 ENCOUNTER — Ambulatory Visit: Payer: Medicare PPO | Admitting: Family Medicine

## 2019-08-21 ENCOUNTER — Encounter: Payer: Self-pay | Admitting: Family Medicine

## 2019-08-21 ENCOUNTER — Other Ambulatory Visit: Payer: Self-pay

## 2019-08-21 VITALS — BP 163/92 | HR 64 | Resp 16 | Ht 75.0 in | Wt 200.1 lb

## 2019-08-21 DIAGNOSIS — F17200 Nicotine dependence, unspecified, uncomplicated: Secondary | ICD-10-CM

## 2019-08-21 DIAGNOSIS — Z0000 Encounter for general adult medical examination without abnormal findings: Secondary | ICD-10-CM

## 2019-08-21 DIAGNOSIS — R7303 Prediabetes: Secondary | ICD-10-CM | POA: Diagnosis not present

## 2019-08-21 DIAGNOSIS — I10 Essential (primary) hypertension: Secondary | ICD-10-CM | POA: Diagnosis not present

## 2019-08-21 MED ORDER — AMLODIPINE BESYLATE 10 MG PO TABS: 10.0000 mg | ORAL_TABLET | Freq: Every day | ORAL | 1 refills | Status: DC

## 2019-08-21 NOTE — Assessment & Plan Note (Signed)
Uncontrolled , inc amlodipine to 10 mg, and f/u DASH diet and commitment to daily physical activity for a minimum of 30 minutes discussed and encouraged, as a part of hypertension management. The importance of attaining a healthy weight is also discussed.  BP/Weight 08/21/2019 08/08/2019 06/23/2019 03/27/2019 11/21/2018 10/10/2018 09/21/9148  Systolic BP 569 794 801 655 374 827 078  Diastolic BP 92 79 93 75 75 75 80  Wt. (Lbs) 200.08 - 205 200 200 200 204.08  BMI 25.01 - 25.62 25 25 25  25.51

## 2019-08-21 NOTE — Patient Instructions (Addendum)
F/u in office with mD re evaluate blood pressure in 6 to 8 weeks, call if you need me sooner  Stop amlodipine 5 mg daily, new is amlodipine 10 mg daily for blood pressure  Commit to Vit d 3, 20000 iU once daily  [place] to evaluate for AAA is to be scheduled ,please  Please refer for lung cancer screening scan  Start wellbutrin twice daily and use lozengers to help with quitting please, will also help bP GlycoHb in office today  You will be referred for Eye exam and to your Urologist    Steps to Quit Smoking Smoking tobacco is the leading cause of preventable death. It can affect almost every organ in the body. Smoking puts you and people around you at risk for many serious, long-lasting (chronic) diseases. Quitting smoking can be hard, but it is one of the best things that you can do for your health. It is never too late to quit. How do I get ready to quit? When you decide to quit smoking, make a plan to help you succeed. Before you quit:  Pick a date to quit. Set a date within the next 2 weeks to give you time to prepare.  Write down the reasons why you are quitting. Keep this list in places where you will see it often.  Tell your family, friends, and co-workers that you are quitting. Their support is important.  Talk with your doctor about the choices that may help you quit.  Find out if your health insurance will pay for these treatments.  Know the people, places, things, and activities that make you want to smoke (triggers). Avoid them. What first steps can I take to quit smoking?  Throw away all cigarettes at home, at work, and in your car.  Throw away the things that you use when you smoke, such as ashtrays and lighters.  Clean your car. Make sure to empty the ashtray.  Clean your home, including curtains and carpets. What can I do to help me quit smoking? Talk with your doctor about taking medicines and seeing a counselor at the same time. You are more likely to succeed  when you do both.  If you are pregnant or breastfeeding, talk with your doctor about counseling or other ways to quit smoking. Do not take medicine to help you quit smoking unless your doctor tells you to do so. To quit smoking: Quit right away  Quit smoking totally, instead of slowly cutting back on how much you smoke over a period of time.  Go to counseling. You are more likely to quit if you go to counseling sessions regularly. Take medicine You may take medicines to help you quit. Some medicines need a prescription, and some you can buy over-the-counter. Some medicines may contain a drug called nicotine to replace the nicotine in cigarettes. Medicines may:  Help you to stop having the desire to smoke (cravings).  Help to stop the problems that come when you stop smoking (withdrawal symptoms). Your doctor may ask you to use:  Nicotine patches, gum, or lozenges.  Nicotine inhalers or sprays.  Non-nicotine medicine that is taken by mouth. Find resources Find resources and other ways to help you quit smoking and remain smoke-free after you quit. These resources are most helpful when you use them often. They include:  Online chats with a Social worker.  Phone quitlines.  Printed Furniture conservator/restorer.  Support groups or group counseling.  Text messaging programs.  Mobile phone apps. Use apps  on your mobile phone or tablet that can help you stick to your quit plan. There are many free apps for mobile phones and tablets as well as websites. Examples include Quit Guide from the [employer] and smokefree.gov  What things can I do to make it easier to quit?   Talk to your family and friends. Ask them to support and encourage you.  Call a phone quitline (1-800-QUIT-NOW), reach out to support groups, or work with a Social worker.  Ask people who smoke to not smoke around you.  Avoid places that make you want to smoke, such as: ? Bars. ? Parties. ? Smoke-break areas at work.  Spend time with  people who do not smoke.  Lower the stress in your life. Stress can make you want to smoke. Try these things to help your stress: ? Getting regular exercise. ? Doing deep-breathing exercises. ? Doing yoga. ? Meditating. ? Doing a body scan. To do this, close your eyes, focus on one area of your body at a time from head to toe. Notice which parts of your body are tense. Try to relax the muscles in those areas. How will I feel when I quit smoking? Day 1 to 3 weeks Within the first 24 hours, you may start to have some problems that come from quitting tobacco. These problems are very bad 2-3 days after you quit, but they do not often last for more than 2-3 weeks. You may get these symptoms:  Mood swings.  Feeling restless, nervous, angry, or annoyed.  Trouble concentrating.  Dizziness.  Strong desire for high-sugar foods and nicotine.  Weight gain.  Trouble pooping (constipation).  Feeling like you may vomit (nausea).  Coughing or a sore throat.  Changes in how the medicines that you take for other issues work in your body.  Depression.  Trouble sleeping (insomnia). Week 3 and afterward After the first 2-3 weeks of quitting, you may start to notice more positive results, such as:  Better sense of smell and taste.  Less coughing and sore throat.  Slower heart rate.  Lower blood pressure.  Clearer skin.  Better breathing.  Fewer sick days. Quitting smoking can be hard. Do not give up if you fail the first time. Some people need to try a few times before they succeed. Do your best to stick to your quit plan, and talk with your doctor if you have any questions or concerns. Summary  Smoking tobacco is the leading cause of preventable death. Quitting smoking can be hard, but it is one of the best things that you can do for your health.  When you decide to quit smoking, make a plan to help you succeed.  Quit smoking right away, not slowly over a period of time.  When  you start quitting, seek help from your doctor, family, or friends. This information is not intended to replace advice given to you by your health care provider. Make sure you discuss any questions you have with your health care provider. Document Revised: 09/16/2018 Document Reviewed: 03/12/2018 Elsevier Patient Education  [place].

## 2019-08-21 NOTE — Assessment & Plan Note (Signed)

## 2019-08-21 NOTE — Progress Notes (Signed)
   Matthew Adkins     MRN: [id number]      DOB: 04-Sep-1950   HPI: Patient is in for annual physical exam. Hypertension uncontrolled and also smoking up to 1 PPd , feels stressed often, wants to quit but this is challenging. Recent labs, if available are reviewed. Immunization is reviewed , and  updated if needed.    PE; BP (!) 163/92   Pulse 64   Resp 16   Ht 6\' 3"  (1.905 m)   Wt 200 lb 1.3 oz (90.8 kg)   SpO2 98%   BMI 25.01 kg/m   Pleasant male, alert and oriented x 3, in no cardio-pulmonary distress. Afebrile. HEENT No facial trauma or asymetry. Sinuses non tender. EOMI External ears normal,  Neck: supple, no adenopathy,JVD or thyromegaly.No bruits.  Chest: Clear to ascultation bilaterally.No crackles or wheezes. Non tender to palpation  Cardiovascular system; Heart sounds normal,  S1 and  S2 ,no S3.  No murmur, or thrill. Apical beat not displaced Peripheral pulses normal.  Abdomen: Soft, non tender, no organomegaly or masses. No bruits. Bowel sounds normal. No guarding, tenderness or rebound.    Musculoskeletal exam: Full ROM of spine, hips , shoulders and knees. No deformity ,swelling or crepitus noted. No muscle wasting or atrophy.   Neurologic: Cranial nerves 2 to 12 intact. Power, tone ,sensation and reflexes normal throughout. No disturbance in gait. No tremor.  Skin: Intact, no ulceration, erythema , scaling or rash noted. Pigmentation normal throughout  Psych; Normal mood and affect. Judgement and concentration normal   Assessment & Plan:  Annual physical exam Annual exam as documented. Counseling done  re healthy lifestyle involving commitment to 150 minutes exercise per week, heart healthy diet, and attaining healthy weight.The importance of adequate sleep also discussed. Regular seat belt use and home safety, is also discussed. Changes in health habits are decided on by the patient with goals and time frames  set for achieving  them. Immunization and cancer screening needs are specifically addressed at this visit.   Essential hypertension Uncontrolled , inc amlodipine to 10 mg, and f/u DASH diet and commitment to daily physical activity for a minimum of 30 minutes discussed and encouraged, as a part of hypertension management. The importance of attaining a healthy weight is also discussed.  BP/Weight 08/21/2019 08/08/2019 06/23/2019 03/27/2019 11/21/2018 10/10/2018 3/88/8280  Systolic BP 034 917 915 056 979 480 165  Diastolic BP 92 79 93 75 75 75 80  Wt. (Lbs) 200.08 - 205 200 200 200 204.08  BMI 25.01 - 25.62 25 25 25  25.51       NICOTINE ADDICTION Asked:confirms currently smokes cigarettes, 10 to 20 / day Assess: Unwilling to set a quit date, but is cutting back Advise: needs to QUIT to reduce risk of cancer, cardio and cerebrovascular disease Assist: counseled for 5 minutes and literature provided Arrange: follow up in 2 to 4 months

## 2019-08-25 ENCOUNTER — Other Ambulatory Visit: Payer: Self-pay | Admitting: Family Medicine

## 2019-08-26 ENCOUNTER — Encounter: Payer: Self-pay | Admitting: Family Medicine

## 2019-08-26 NOTE — Assessment & Plan Note (Signed)
Patient educated about the importance of limiting  Carbohydrate intake , the need to commit to daily physical activity for a minimum of 30 minutes , and to commit weight loss. The fact that changes in all these areas will reduce or eliminate all together the development of diabetes is stressed.   Diabetic Labs Latest Ref Rng & Units 08/18/2019 04/16/2018 01/21/2018 11/29/2017 07/02/2017  HbA1c 4.8 - 5.6 % - - 6.5(H) - 6.4(H)  Chol 100 - 199 mg/dL 179 - 192 - 178  HDL >39 mg/dL 51 - 54 - 37(L)  Calc LDL 0 - 99 mg/dL 117(H) - 129(H) - 119(H)  Triglycerides 0 - 149 mg/dL 58 - 45 - 111  Creatinine 0.76 - 1.27 mg/dL 1.01 0.99 0.98 0.96 -   BP/Weight 08/21/2019 08/08/2019 06/23/2019 03/27/2019 11/21/2018 10/10/2018 07/09/8887  Systolic BP 169 450 388 828 003 491 791  Diastolic BP 92 79 93 75 75 75 80  Wt. (Lbs) 200.08 - 205 200 200 200 204.08  BMI 25.01 - 25.62 25 25 25  25.51   No flowsheet data found.

## 2019-08-26 NOTE — Assessment & Plan Note (Signed)
Asked:confirms currently smokes cigarettes, 10 to 20 / day Assess: Unwilling to set a quit date, but is cutting back Advise: needs to QUIT to reduce risk of cancer, cardio and cerebrovascular disease Assist: counseled for 5 minutes and literature provided Arrange: follow up in 2 to 4 months

## 2019-08-28 ENCOUNTER — Other Ambulatory Visit: Payer: Self-pay | Admitting: *Deleted

## 2019-08-28 ENCOUNTER — Other Ambulatory Visit (INDEPENDENT_AMBULATORY_CARE_PROVIDER_SITE_OTHER): Payer: Medicare PPO

## 2019-08-28 DIAGNOSIS — R7303 Prediabetes: Secondary | ICD-10-CM

## 2019-08-28 DIAGNOSIS — F17210 Nicotine dependence, cigarettes, uncomplicated: Secondary | ICD-10-CM

## 2019-08-28 DIAGNOSIS — Z87891 Personal history of nicotine dependence: Secondary | ICD-10-CM

## 2019-08-28 LAB — POCT GLYCOSYLATED HEMOGLOBIN (HGB A1C): Hemoglobin A1C: 5.9 % — AB (ref 4.0–5.6)

## 2019-08-30 LAB — POCT GLYCOSYLATED HEMOGLOBIN (HGB A1C)
HbA1c POC (<> result, manual entry): 5.9 % (ref 4.0–5.6)
HbA1c, POC (controlled diabetic range): 5.9 % (ref 0.0–7.0)
HbA1c, POC (prediabetic range): 5.9 % (ref 5.7–6.4)
Hemoglobin A1C: 5.9 % — AB (ref 4.0–5.6)

## 2019-08-30 NOTE — Addendum Note (Signed)
Addended by: Eual Fines on: 08/30/2019 01:53 PM   Modules accepted: Orders

## 2019-09-11 ENCOUNTER — Other Ambulatory Visit: Payer: Self-pay | Admitting: Orthopedic Surgery

## 2019-10-04 ENCOUNTER — Ambulatory Visit: Payer: Medicare PPO | Admitting: Family Medicine

## 2019-10-05 ENCOUNTER — Encounter: Payer: Self-pay | Admitting: Acute Care

## 2019-10-11 ENCOUNTER — Ambulatory Visit: Payer: Medicare Other | Admitting: Orthopedic Surgery

## 2019-11-08 ENCOUNTER — Emergency Department (HOSPITAL_COMMUNITY): Payer: Medicare PPO

## 2019-11-08 ENCOUNTER — Inpatient Hospital Stay (HOSPITAL_COMMUNITY)
Admission: EM | Admit: 2019-11-08 | Discharge: 2019-11-10 | DRG: 309 | Disposition: A | Discharge status: Home / Self Care | Payer: Medicare PPO | Attending: Family Medicine | Admitting: Family Medicine

## 2019-11-08 ENCOUNTER — Other Ambulatory Visit: Payer: Self-pay

## 2019-11-08 ENCOUNTER — Encounter (HOSPITAL_COMMUNITY): Payer: Self-pay

## 2019-11-08 DIAGNOSIS — I340 Nonrheumatic mitral (valve) insufficiency: Secondary | ICD-10-CM | POA: Diagnosis not present

## 2019-11-08 DIAGNOSIS — F17200 Nicotine dependence, unspecified, uncomplicated: Secondary | ICD-10-CM | POA: Diagnosis present

## 2019-11-08 DIAGNOSIS — E2740 Unspecified adrenocortical insufficiency: Secondary | ICD-10-CM | POA: Diagnosis present

## 2019-11-08 DIAGNOSIS — E7439 Other disorders of intestinal carbohydrate absorption: Secondary | ICD-10-CM | POA: Diagnosis present

## 2019-11-08 DIAGNOSIS — Z8546 Personal history of malignant neoplasm of prostate: Secondary | ICD-10-CM

## 2019-11-08 DIAGNOSIS — E876 Hypokalemia: Secondary | ICD-10-CM | POA: Diagnosis present

## 2019-11-08 DIAGNOSIS — R55 Syncope and collapse: Secondary | ICD-10-CM | POA: Diagnosis present

## 2019-11-08 DIAGNOSIS — M5136 Other intervertebral disc degeneration, lumbar region: Secondary | ICD-10-CM | POA: Diagnosis present

## 2019-11-08 DIAGNOSIS — F17210 Nicotine dependence, cigarettes, uncomplicated: Secondary | ICD-10-CM | POA: Diagnosis present

## 2019-11-08 DIAGNOSIS — Z91030 Bee allergy status: Secondary | ICD-10-CM | POA: Diagnosis not present

## 2019-11-08 DIAGNOSIS — Z79899 Other long term (current) drug therapy: Secondary | ICD-10-CM

## 2019-11-08 DIAGNOSIS — Z20822 Contact with and (suspected) exposure to covid-19: Secondary | ICD-10-CM | POA: Diagnosis present

## 2019-11-08 DIAGNOSIS — Z885 Allergy status to narcotic agent status: Secondary | ICD-10-CM

## 2019-11-08 DIAGNOSIS — R000 Tachycardia, unspecified: Secondary | ICD-10-CM | POA: Diagnosis not present

## 2019-11-08 DIAGNOSIS — R531 Weakness: Secondary | ICD-10-CM | POA: Diagnosis not present

## 2019-11-08 DIAGNOSIS — R5381 Other malaise: Secondary | ICD-10-CM | POA: Diagnosis not present

## 2019-11-08 DIAGNOSIS — I951 Orthostatic hypotension: Secondary | ICD-10-CM | POA: Diagnosis present

## 2019-11-08 DIAGNOSIS — E785 Hyperlipidemia, unspecified: Secondary | ICD-10-CM | POA: Diagnosis present

## 2019-11-08 DIAGNOSIS — R7303 Prediabetes: Secondary | ICD-10-CM | POA: Diagnosis present

## 2019-11-08 DIAGNOSIS — I110 Hypertensive heart disease with heart failure: Secondary | ICD-10-CM | POA: Diagnosis present

## 2019-11-08 DIAGNOSIS — Z8249 Family history of ischemic heart disease and other diseases of the circulatory system: Secondary | ICD-10-CM | POA: Diagnosis not present

## 2019-11-08 DIAGNOSIS — R0902 Hypoxemia: Secondary | ICD-10-CM | POA: Diagnosis not present

## 2019-11-08 DIAGNOSIS — K219 Gastro-esophageal reflux disease without esophagitis: Secondary | ICD-10-CM | POA: Diagnosis present

## 2019-11-08 DIAGNOSIS — E871 Hypo-osmolality and hyponatremia: Secondary | ICD-10-CM | POA: Diagnosis present

## 2019-11-08 DIAGNOSIS — N390 Urinary tract infection, site not specified: Secondary | ICD-10-CM | POA: Diagnosis present

## 2019-11-08 DIAGNOSIS — D649 Anemia, unspecified: Secondary | ICD-10-CM | POA: Diagnosis present

## 2019-11-08 DIAGNOSIS — M170 Bilateral primary osteoarthritis of knee: Secondary | ICD-10-CM | POA: Diagnosis not present

## 2019-11-08 DIAGNOSIS — I471 Supraventricular tachycardia: Secondary | ICD-10-CM | POA: Diagnosis present

## 2019-11-08 DIAGNOSIS — M1710 Unilateral primary osteoarthritis, unspecified knee: Secondary | ICD-10-CM | POA: Diagnosis present

## 2019-11-08 DIAGNOSIS — I5030 Unspecified diastolic (congestive) heart failure: Secondary | ICD-10-CM | POA: Diagnosis present

## 2019-11-08 DIAGNOSIS — I10 Essential (primary) hypertension: Secondary | ICD-10-CM | POA: Diagnosis not present

## 2019-11-08 DIAGNOSIS — B9620 Unspecified Escherichia coli [E. coli] as the cause of diseases classified elsewhere: Secondary | ICD-10-CM | POA: Diagnosis present

## 2019-11-08 DIAGNOSIS — R0602 Shortness of breath: Secondary | ICD-10-CM | POA: Diagnosis not present

## 2019-11-08 DIAGNOSIS — R079 Chest pain, unspecified: Secondary | ICD-10-CM

## 2019-11-08 DIAGNOSIS — Z8551 Personal history of malignant neoplasm of bladder: Secondary | ICD-10-CM

## 2019-11-08 DIAGNOSIS — Z8719 Personal history of other diseases of the digestive system: Secondary | ICD-10-CM

## 2019-11-08 DIAGNOSIS — Z833 Family history of diabetes mellitus: Secondary | ICD-10-CM

## 2019-11-08 DIAGNOSIS — I4892 Unspecified atrial flutter: Secondary | ICD-10-CM | POA: Diagnosis present

## 2019-11-08 DIAGNOSIS — R42 Dizziness and giddiness: Secondary | ICD-10-CM | POA: Diagnosis not present

## 2019-11-08 DIAGNOSIS — R9431 Abnormal electrocardiogram [ECG] [EKG]: Secondary | ICD-10-CM | POA: Diagnosis not present

## 2019-11-08 DIAGNOSIS — R0689 Other abnormalities of breathing: Secondary | ICD-10-CM | POA: Diagnosis not present

## 2019-11-08 DIAGNOSIS — M179 Osteoarthritis of knee, unspecified: Secondary | ICD-10-CM | POA: Diagnosis present

## 2019-11-08 DIAGNOSIS — Z86010 Personal history of colonic polyps: Secondary | ICD-10-CM

## 2019-11-08 DIAGNOSIS — R739 Hyperglycemia, unspecified: Secondary | ICD-10-CM | POA: Diagnosis present

## 2019-11-08 DIAGNOSIS — I6523 Occlusion and stenosis of bilateral carotid arteries: Secondary | ICD-10-CM | POA: Diagnosis not present

## 2019-11-08 LAB — CBC
HCT: 40.2 % (ref 39.0–52.0)
Hemoglobin: 13.5 g/dL (ref 13.0–17.0)
MCH: 31 pg (ref 26.0–34.0)
MCHC: 33.6 g/dL (ref 30.0–36.0)
MCV: 92.2 fL (ref 80.0–100.0)
Platelets: 210 10*3/uL (ref 150–400)
RBC: 4.36 MIL/uL (ref 4.22–5.81)
RDW: 12.9 % (ref 11.5–15.5)
WBC: 6.5 10*3/uL (ref 4.0–10.5)
nRBC: 0 % (ref 0.0–0.2)

## 2019-11-08 LAB — DIFFERENTIAL
Abs Immature Granulocytes: 0.03 10*3/uL (ref 0.00–0.07)
Basophils Absolute: 0 10*3/uL (ref 0.0–0.1)
Basophils Relative: 1 %
Eosinophils Absolute: 0 10*3/uL (ref 0.0–0.5)
Eosinophils Relative: 1 %
Immature Granulocytes: 1 %
Lymphocytes Relative: 18 %
Lymphs Abs: 1.1 10*3/uL (ref 0.7–4.0)
Monocytes Absolute: 0.4 10*3/uL (ref 0.1–1.0)
Monocytes Relative: 7 %
Neutro Abs: 4.6 10*3/uL (ref 1.7–7.7)
Neutrophils Relative %: 72 %

## 2019-11-08 LAB — BASIC METABOLIC PANEL
Anion gap: 7 (ref 5–15)
BUN: 15 mg/dL (ref 8–23)
CO2: 25 mmol/L (ref 22–32)
Calcium: 8.2 mg/dL — ABNORMAL LOW (ref 8.9–10.3)
Chloride: 101 mmol/L (ref 98–111)
Creatinine, Ser: 1.15 mg/dL (ref 0.61–1.24)
GFR, Estimated: >60 mL/min (ref >60)
Glucose, Bld: 175 mg/dL — ABNORMAL HIGH (ref 70–99)
Potassium: 2.8 mmol/L — ABNORMAL LOW (ref 3.5–5.1)
Sodium: 133 mmol/L — ABNORMAL LOW (ref 135–145)

## 2019-11-08 LAB — MAGNESIUM: Magnesium: 1.8 mg/dL (ref 1.7–2.4)

## 2019-11-08 LAB — RESPIRATORY PANEL BY RT PCR (FLU A&B, COVID)
Influenza A by PCR: NEGATIVE
Influenza B by PCR: NEGATIVE
SARS Coronavirus 2 by RT PCR: NEGATIVE

## 2019-11-08 LAB — HEPATIC FUNCTION PANEL
ALT: 15 U/L (ref 0–44)
AST: 17 U/L (ref 15–41)
Albumin: 3.6 g/dL (ref 3.5–5.0)
Alkaline Phosphatase: 51 U/L (ref 38–126)
Bilirubin, Direct: 0.1 mg/dL (ref 0.0–0.2)
Indirect Bilirubin: 0.5 mg/dL (ref 0.3–0.9)
Total Bilirubin: 0.6 mg/dL (ref 0.3–1.2)
Total Protein: 6.7 g/dL (ref 6.5–8.1)

## 2019-11-08 LAB — TROPONIN I (HIGH SENSITIVITY)
Troponin I (High Sensitivity): 4 ng/L (ref <18)
Troponin I (High Sensitivity): 4 ng/L (ref <18)

## 2019-11-08 LAB — CBG MONITORING, ED: Glucose-Capillary: 147 mg/dL — ABNORMAL HIGH (ref 70–99)

## 2019-11-08 MED ORDER — SODIUM CHLORIDE 0.9 % IV BOLUS
1000.0000 mL | Freq: Once | INTRAVENOUS | Status: AC
  Administered 2019-11-08: 1000 mL via INTRAVENOUS

## 2019-11-08 MED ORDER — POTASSIUM CHLORIDE CRYS ER 20 MEQ PO TBCR
40.0000 meq | EXTENDED_RELEASE_TABLET | Freq: Once | ORAL | Status: AC
  Administered 2019-11-08: 40 meq via ORAL
  Filled 2019-11-08: qty 2

## 2019-11-08 MED ORDER — POTASSIUM CHLORIDE 10 MEQ/100ML IV SOLN
10.0000 meq | Freq: Once | INTRAVENOUS | Status: AC
  Administered 2019-11-08: 10 meq via INTRAVENOUS
  Filled 2019-11-08: qty 100

## 2019-11-08 MED ORDER — ENOXAPARIN SODIUM 40 MG/0.4ML ~~LOC~~ SOLN
40.0000 mg | SUBCUTANEOUS | Status: DC
  Administered 2019-11-09 – 2019-11-10 (×2): 40 mg via SUBCUTANEOUS
  Filled 2019-11-08 (×2): qty 0.4

## 2019-11-08 MED ORDER — PANTOPRAZOLE SODIUM 40 MG PO TBEC
40.0000 mg | DELAYED_RELEASE_TABLET | Freq: Every day | ORAL | Status: DC
  Administered 2019-11-09 – 2019-11-10 (×2): 40 mg via ORAL
  Filled 2019-11-08 (×2): qty 1

## 2019-11-08 NOTE — H&P (Signed)
History and Physical  JOQUAN LOTZ KDT:[id number] DOB: Apr 05, 1950 DOA: 11/08/2019  Referring physician: Milton Ferguson, MD PCP: Fayrene Helper, MD  Patient coming from: Home  Chief Complaint: Dizziness   HPI: Matthew Adkins is a 69 y.o. male with medical history significant for hypertension, hyperlipidemia, osteoarthritis, DDD,  GERD, prediabetes, tobacco use and history of bladder cancer (diagnosed on TURBT in 2000 by Dr. [place] Pink) in remission and history of prostate cancer s/p surgical therapy (robotic assisted laparoscopic radical prostatectomy level 2) with a unilateral left nerve sparing robot-assisted laparoscopic radical prostatectomy and pelvic lymphadenectomy (August 2016) who presents to the emergency department due to dizziness which started this afternoon.  Patient states that dizziness worsens with standing and walking, this was not associated with head movement.  He denies chest pain, shortness of breath, headache, fever, chills, ringing in the ears or any recent upper respiratory infection.  ED Course:  In the emergency department, he was hemodynamically stable, orthostatic vital signs was negative.  However the ED physician states that HR was in the 120s at bedside and that this quickly decreased to 60s within few seconds.  Work-up in the ED showed normal CBC, mild hyponatremia, hypokalemia, troponin x1 is negative, magnesium was 1.8.  Chest x-ray showed no active disease.  CT of head without contrast showed no acute retinal abnormality.  IV hydration was provided, potassium was replenished.  Hospitalist was asked to admit patient for further evaluation and management.  Review of Systems: Constitutional: Negative for chills and fever.  HENT: Negative for ear pain and sore throat.   Eyes: Negative for pain and visual disturbance.  Respiratory: Negative for cough, chest tightness and shortness of breath.   Cardiovascular: Negative for chest pain and palpitations.   Gastrointestinal: Negative for abdominal pain and vomiting.  Endocrine: Negative for polyphagia and polyuria.  Genitourinary: Negative for decreased urine volume, dysuria Musculoskeletal: Negative for arthralgias and back pain.  Skin: Negative for color change and rash.  Allergic/Immunologic: Negative for immunocompromised state.  Neurological: Positive for dizziness.  Negative for tremors, syncope, speech difficulty, and headaches.  Hematological: Does not bruise/bleed easily.  All other systems reviewed and are negative    Past Medical History:  Diagnosis Date  . Bladder cancer ([place]) 2000  . Depression   . Essential hypertension 01/16/2018  . Nicotine addiction   . Prostate cancer ([place]) 07/06/2014  . Seasonal allergies    Past Surgical History:  Procedure Laterality Date  . athroscopy of right knee  2002  . cancerous polyps removed from bladder  2000  . COLONOSCOPY  MJ 2009 pmhX: POLYPS   POLYP REMOVED BUT NOT RETRIEVED  . COLONOSCOPY  LS 2007 ARS   ? POLYPS, no path available  . COLONOSCOPY  09/26/2010   sessile poylp(2) pan-colonic diverticulosis,mild/internal hemorrhoids  . COLONOSCOPY WITH PROPOFOL N/A 02/09/2017   Procedure: COLONOSCOPY WITH PROPOFOL;  Surgeon: Danie Binder, MD;  Location: [facility];  Service: Endoscopy;  Laterality: N/A;  8:30am  . ESOPHAGOGASTRODUODENOSCOPY (EGD) WITH PROPOFOL N/A 02/09/2017   Procedure: ESOPHAGOGASTRODUODENOSCOPY (EGD) WITH PROPOFOL;  Surgeon: Danie Binder, MD;  Location: [facility];  Service: Endoscopy;  Laterality: N/A;  . FOOT SURGERY Bilateral 2004  . HERNIA REPAIR  1980's   left inguinal hernia  . LYMPHADENECTOMY Bilateral 09/03/2014   Procedure: PELVIC LYMPHADENECTOMY;  Surgeon: Raynelle Bring, MD;  Location: [facility];  Service: Urology;  Laterality: Bilateral;  . POLYPECTOMY  02/09/2017   Procedure: POLYPECTOMY;  Surgeon: Danie Binder, MD;  Location: [facility];  Service: Endoscopy;;  cecal polyp, hepatic flexure  polyps x3, transverse colon polyp, descending colon polyps x2, rectal polyps x2  . PROSTATE BIOPSY  07/06/14  . ROBOT ASSISTED LAPAROSCOPIC RADICAL PROSTATECTOMY N/A 09/03/2014   Procedure: ROBOTIC ASSISTED LAPAROSCOPIC RADICAL PROSTATECTOMY LEVEL 2;  Surgeon: Raynelle Bring, MD;  Location: [facility];  Service: Urology;  Laterality: N/A;    Social History:  reports that he has been smoking cigarettes. He has a 30.00 pack-year smoking history. He has never used smokeless tobacco. He reports current alcohol use. He reports current drug use. Drug: Marijuana.   Allergies  Allergen Reactions  . Bee Venom   . Codeine Nausea And Vomiting    Family History  Problem Relation Age of Onset  . Heart attack Father   . Diabetes Sister   . Hypertension Sister   . Hypertension Sister   . Aneurysm Sister        brain   . Arthritis Other        family history   . Colon cancer Neg Hx   . Colon polyps Neg Hx     Prior to Admission medications   Medication Sig Start Date End Date Taking? Authorizing Provider  acetaminophen (TYLENOL) 500 MG tablet Take 1,000 mg by mouth every 6 (six) hours as needed for mild pain.   Yes [provider]  amLODipine (NORVASC) 10 MG tablet Take 1 tablet (10 mg total) by mouth daily. 08/21/19  Yes Fayrene Helper, MD  buPROPion [facility] SR) 150 MG 12 hr tablet Take 1 tablet (150 mg total) by mouth 2 (two) times daily. 11/21/18  Yes Fayrene Helper, MD  cyclobenzaprine (FLEXERIL) 10 MG tablet Take 1 tablet (10 mg total) by mouth at bedtime. 02/17/18  Yes Fayrene Helper, MD  diazepam (VALIUM) 2 MG tablet Take 1 tablet (2 mg total) by mouth every 6 (six) hours as needed for muscle spasms. 06/23/19  Yes Daleen Bo, MD  FLUoxetine (PROZAC) 20 MG capsule TAKE 1 CAPSULE BY MOUTH DAILY. 08/28/19  Yes Fayrene Helper, MD  HYDROcodone-acetaminophen (NORCO/VICODIN) 5-325 MG tablet TAKE ONE TABLET BY MOUTH EVERY 6 HOURS AS NEEDED Patient not taking: Reported on  11/08/2019 09/20/19   Carole Civil, MD  omeprazole (PRILOSEC) 20 MG capsule TAKE 1 CAPSULE BY MOUTH 30 MINUTES PRIOR TO BREAKFAST. Patient taking differently: Take 20 mg by mouth See admin instructions. TAKE 1 CAPSULE BY MOUTH 30 MINUTES PRIOR TO BREAKFAST. 05/25/19  Yes Carlis Stable, NP  dextromethorphan (DELSYM) 30 MG/5ML liquid Take 2.5 mLs (15 mg total) by mouth 2 (two) times daily. Patient not taking: Reported on 11/08/2019 05/19/19   Perlie Mayo, NP  Multiple Vitamins-Minerals (ONE DAILY MENS 50+ MULTIVIT PO) Take 1 tablet by mouth daily.    [provider]    Physical Exam: BP 119/77 (BP Location: Left Arm)   Pulse (!) 55   Temp 98.1 F (36.7 C) (Oral)   Resp 17   Ht 6\' 3"  (1.905 m)   Wt 93 kg   SpO2 96%   BMI 25.62 kg/m   . General: 69 y.o. year-old male well developed well nourished in no acute distress.  Alert and oriented x3. Marland Kitchen HEENT: NCAT, EOMI . Neck: Supple, trachea media . Cardiovascular: Bradycardia (at bedside), regular rate and rhythm with no rubs or gallops.  No thyromegaly or JVD noted.  No lower extremity edema. 2/4 pulses in all 4 extremities. Marland Kitchen Respiratory: Clear to auscultation with  no wheezes or rales. Good inspiratory effort. . Abdomen: Soft nontender nondistended with normal bowel sounds x4 quadrants. . Muskuloskeletal: No cyanosis, clubbing or edema noted bilaterally . Neuro: CN II-XII intact, strength, sensation, reflexes . Skin: No ulcerative lesions noted or rashes . Psychiatry: Judgement and insight appear normal. Mood is appropriate for condition and setting          Labs on Admission:  Basic Metabolic Panel: Recent Labs  Lab 11/08/19 1807 11/08/19 2056  NA 133*  --   K 2.8*  --   CL 101  --   CO2 25  --   GLUCOSE 175*  --   BUN 15  --   CREATININE 1.15  --   CALCIUM 8.2*  --   MG  --  1.8   Liver Function Tests: Recent Labs  Lab 11/08/19 1807  AST 17  ALT 15  ALKPHOS 51  BILITOT 0.6  PROT 6.7  ALBUMIN 3.6    No results for input(s): LIPASE, AMYLASE in the last 168 hours. No results for input(s): AMMONIA in the last 168 hours. CBC: Recent Labs  Lab 11/08/19 1807  WBC 6.5  NEUTROABS 4.6  HGB 13.5  HCT 40.2  MCV 92.2  PLT 210   Cardiac Enzymes: No results for input(s): CKTOTAL, CKMB, CKMBINDEX, TROPONINI in the last 168 hours.  BNP (last 3 results) No results for input(s): BNP in the last 8760 hours.  ProBNP (last 3 results) No results for input(s): PROBNP in the last 8760 hours.  CBG: Recent Labs  Lab 11/08/19 1758  GLUCAP 147*    Radiological Exams on Admission: DG Chest 1 View  Result Date: 11/08/2019 CLINICAL DATA:  Chest pain with shortness of breath and weakness. EXAM: CHEST  1 VIEW.  The patient is rotated. COMPARISON:  Chest x-ray 02/03/2015, CT chest 06/14/2017 FINDINGS: The heart size and mediastinal contours are within normal limits. No focal consolidation. No pulmonary edema. No pleural effusion. No pneumothorax. No acute osseous abnormality. IMPRESSION: No active disease. Electronically Signed   By: Iven Finn M.D.   On: 11/08/2019 18:43   CT Head Wo Contrast  Result Date: 11/08/2019 CLINICAL DATA:  Dizziness, cerebral hemorrhage suspected. EXAM: CT HEAD WITHOUT CONTRAST TECHNIQUE: Contiguous axial images were obtained from the base of the skull through the vertex without intravenous contrast. COMPARISON:  CT head 06/23/2019 FINDINGS: Brain: No evidence of large-territorial acute infarction. No parenchymal hemorrhage. No mass lesion. No extra-axial collection. No mass effect or midline shift. No hydrocephalus. Basilar cisterns are patent. Vascular: No hyperdense vessel. Atherosclerotic calcifications are present within the cavernous internal carotid arteries. Skull: No acute fracture or focal lesion. Sinuses/Orbits: Paranasal sinuses and mastoid air cells are clear. The orbits are unremarkable. Other: None. IMPRESSION: No acute intracranial abnormality.  Electronically Signed   By: Iven Finn M.D.   On: 11/08/2019 18:42    EKG: I independently viewed the EKG done and my findings are as followed: Sinus rhythm at a rate of 64 bpm with nonspecific T wave abnormalities  Assessment/Plan Present on Admission: . Prediabetes . NICOTINE ADDICTION . Arthritis of knee, degenerative . Degenerative disc disease, lumbar . GERD (gastroesophageal reflux disease) . Essential hypertension  Principal Problem:   Dizziness Active Problems:   Prediabetes   NICOTINE ADDICTION   Arthritis of knee, degenerative   Degenerative disc disease, lumbar   GERD (gastroesophageal reflux disease)   Essential hypertension   Hypokalemia   Acute dizziness due to unknown cause at this time Patient described the  dizziness as sensation of imbalance on standing/walking which started this afternoon.  He denies prior similar symptoms ED physician reported sudden tachycardia with HR in the 120s with decreased HR to 60s within few seconds at bedside (?  Tachybradycardia syndrome).  He was bradycardic with HR at 56-58/min at bedside on my exam. IV hydration was provided in the ED, continue IV hydration Orthostatic exam was negative He denies vertigo, tinnitus, recent viral illness, dizziness with positional change except on standing/walking He denies chest pain, shortness of breath. Troponin will be checked, EKG shows sinus rhythm at a rate of 64 bpm Continue telemetry Cardiology will be consulted for further recommendation regarding questionable tachybradycardia syndrome  Hypokalemia  K+ 2.8, this was replenished  Hyponatremia secondary to hyperglycemia Na+ 133; corrected sodium level due to blood glucose (175)=134 Continue to monitor sodium level with morning labs  Hyperglycemia possibly due to history of prediabetes BG = 175, last A1c done on 08/29/2022 was 5.9 Continue lifestyle and diet modification  Arthritis/DDD Continue Tylenol 650 mg every 6 hours as  needed for pain  GERD Continue Protonix  Essential hypertension Continue amlodipine per home regimen  Nicotine addiction Patient endorsed 15-pack-year smoking history with ongoing nicotine use, counseled on tobacco abuse cessation  Continue nicotine patch   DVT prophylaxis: Lovenox  Code Status: Full code  Family Communication: None at bedside  Disposition Plan:  Patient is from:                        home Anticipated DC to:                   home Anticipated DC date:               1 day Anticipated DC barriers:          Patient is unstable to be discharged at this time due to dizziness and pending cardiology consult for possible tachybradycardia syndrome   Consults called: Cardiology  Admission status: Observation    Bernadette Hoit MD [facility]  11/09/2019, 12:45 AM

## 2019-11-08 NOTE — ED Triage Notes (Signed)
Pt brought to ED via [facility] for dizziness started at 1530. Per EMS, pt hard to arouse, dizzy when standing for orthostatics. BP dropped from 153/74 to 109/61 when standing.

## 2019-11-08 NOTE — ED Notes (Signed)
Dr. Roderic Palau made aware of patient's ortho vitals.

## 2019-11-08 NOTE — ED Provider Notes (Signed)
[facility] EMERGENCY DEPARTMENT Provider Note   CSN: [id number] Arrival date & time: 11/08/19  1751     History Chief Complaint  Patient presents with  . Dizziness    Matthew Adkins is a 69 y.o. male.  Patient complains of dizziness.  He was orthostatic when the EMS arrived.  Patient complains of episodes of dizziness  The history is provided by the patient and medical records. No language interpreter was used.  Dizziness Quality:  Lightheadedness Severity:  Moderate Onset quality:  Sudden Timing:  Intermittent Progression:  Waxing and waning Chronicity:  New Context: not when bending over   Relieved by:  Nothing Worsened by:  Nothing Ineffective treatments:  None tried Associated symptoms: no chest pain, no diarrhea and no headaches        Past Medical History:  Diagnosis Date  . Bladder cancer ([place]) 2000  . Depression   . Essential hypertension 01/16/2018  . Nicotine addiction   . Prostate cancer ([place]) 07/06/2014  . Seasonal allergies     Patient Active Problem List   Diagnosis Date Noted  . Headache 02/17/2018  . Essential hypertension 01/16/2018  . GERD (gastroesophageal reflux disease) 07/13/2017  . H/O prostate cancer 07/11/2017  . Heme + stool 01/04/2017  . Tubular adenoma of colon 11/18/2016  . Left shoulder pain 10/30/2015  . Prostate cancer ([place]) 07/25/2014  . Annual physical exam 06/07/2014  . Hemorrhoids, internal 09/30/2011  . Arthritis of knee, degenerative 09/30/2011  . Degenerative disc disease, lumbar 09/30/2011  . Pulmonary nodule 09/21/2011  . 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 SKIN TAG 10/21/2009  . Allergic rhinitis 09/19/2009  . Dyslipidemia 08/17/2008  . Prediabetes 10/14/2007  . BLADDER CANCER 04/13/2007  . NICOTINE ADDICTION 04/13/2007    Past Surgical History:  Procedure Laterality Date  . athroscopy of right knee  2002  . cancerous polyps removed from bladder  2000  .  COLONOSCOPY  MJ 2009 pmhX: POLYPS   POLYP REMOVED BUT NOT RETRIEVED  . COLONOSCOPY  LS 2007 ARS   ? POLYPS, no path available  . COLONOSCOPY  09/26/2010   sessile poylp(2) pan-colonic diverticulosis,mild/internal hemorrhoids  . COLONOSCOPY WITH PROPOFOL N/A 02/09/2017   Procedure: COLONOSCOPY WITH PROPOFOL;  Surgeon: Danie Binder, MD;  Location: [facility] SU[facility];  Service: Endoscopy;  Laterality: N/A;  8:30am  . ESOPHAGOGASTRODUODENOSCOPY (EGD) WITH PROPOFOL N/A 02/09/2017   Procedure: ESOPHAGOGASTRODUODENOSCOPY (EGD) WITH PROPOFOL;  Surgeon: Danie Binder, MD;  Location: [facility] END[facility] SUITE;  Service: Endoscopy;  Laterality: N/A;  . FOOT SURGERY Bilateral 2004  . HERNIA REPAIR  1980's   left inguinal hernia  . LYMPHADENECTOMY Bilateral 09/03/2014   Procedure: PELVIC LYMPHADENECTOMY;  Surgeon: Raynelle Bring, MD;  Location: [facility] ORS;  Service: Urology;  Laterality: Bilateral;  . POLYPECTOMY  02/09/2017   Procedure: POLYPECTOMY;  Surgeon: Danie Binder, MD;  Location: [facility];  Service: Endoscopy;;  cecal polyp, hepatic flexure polyps x3, transverse colon polyp, descending colon polyps x2, rectal polyps x2  . PROSTATE BIOPSY  07/06/14  . ROBOT ASSISTED LAPAROSCOPIC RADICAL PROSTATECTOMY N/A 09/03/2014   Procedure: ROBOTIC ASSISTED LAPAROSCOPIC RADICAL PROSTATECTOMY LEVEL 2;  Surgeon: Raynelle Bring, MD;  Location: [facility];  Service: Urology;  Laterality: N/A;       Family History  Problem Relation Age of Onset  . Heart attack Father   . Diabetes Sister   . Hypertension Sister   . Hypertension Sister   . Aneurysm Sister  brain   . Arthritis Other        family history   . Colon cancer Neg Hx   . Colon polyps Neg Hx     Social History   Tobacco Use  . Smoking status: Current Every Day Smoker    Packs/day: 1.00    Years: 30.00    Pack years: 30.00    Types: Cigarettes  . Smokeless tobacco: Never Used  Vaping Use  . Vaping Use: Never used  Substance Use Topics  . Alcohol use:  Yes    Comment: 3 pints/month  . Drug use: Yes    Types: Marijuana    Comment: occassionally    Home Medications Prior to Admission medications   Medication Sig Start Date End Date Taking? Authorizing Provider  acetaminophen (TYLENOL) 500 MG tablet Take 1,000 mg by mouth every 6 (six) hours as needed for mild pain.   Yes [provider]  amLODipine (NORVASC) 10 MG tablet Take 1 tablet (10 mg total) by mouth daily. 08/21/19  Yes Fayrene Helper, MD  buPROPion [facility] SR) 150 MG 12 hr tablet Take 1 tablet (150 mg total) by mouth 2 (two) times daily. 11/21/18  Yes Fayrene Helper, MD  cyclobenzaprine (FLEXERIL) 10 MG tablet Take 1 tablet (10 mg total) by mouth at bedtime. 02/17/18  Yes Fayrene Helper, MD  diazepam (VALIUM) 2 MG tablet Take 1 tablet (2 mg total) by mouth every 6 (six) hours as needed for muscle spasms. 06/23/19  Yes Daleen Bo, MD  FLUoxetine (PROZAC) 20 MG capsule TAKE 1 CAPSULE BY MOUTH DAILY. 08/28/19  Yes Fayrene Helper, MD  HYDROcodone-acetaminophen (NORCO/VICODIN) 5-325 MG tablet TAKE ONE TABLET BY MOUTH EVERY 6 HOURS AS NEEDED Patient not taking: Reported on 11/08/2019 09/20/19   Carole Civil, MD  omeprazole (PRILOSEC) 20 MG capsule TAKE 1 CAPSULE BY MOUTH 30 MINUTES PRIOR TO BREAKFAST. Patient taking differently: Take 20 mg by mouth See admin instructions. TAKE 1 CAPSULE BY MOUTH 30 MINUTES PRIOR TO BREAKFAST. 05/25/19  Yes Carlis Stable, NP  dextromethorphan (DELSYM) 30 MG/5ML liquid Take 2.5 mLs (15 mg total) by mouth 2 (two) times daily. Patient not taking: Reported on 11/08/2019 05/19/19   Perlie Mayo, NP  Multiple Vitamins-Minerals (ONE DAILY MENS 50+ MULTIVIT PO) Take 1 tablet by mouth daily.    [provider]    Allergies    Bee venom and Codeine  Review of Systems   Review of Systems  Constitutional: Negative for appetite change and fatigue.  HENT: Negative for congestion, ear discharge and sinus pressure.    Eyes: Negative for discharge.  Respiratory: Negative for cough.   Cardiovascular: Negative for chest pain.  Gastrointestinal: Negative for abdominal pain and diarrhea.  Genitourinary: Negative for frequency and hematuria.  Musculoskeletal: Negative for back pain.  Skin: Negative for rash.  Neurological: Positive for dizziness. Negative for seizures and headaches.  Psychiatric/Behavioral: Negative for hallucinations.    Physical Exam Updated Vital Signs BP 123/71 (BP Location: Left Arm)   Pulse 70   Temp 98.1 F (36.7 C) (Oral)   Resp 17   Ht 6\' 3"  (1.905 m)   Wt 93 kg   SpO2 95%   BMI 25.62 kg/m   Physical Exam Vitals and nursing note reviewed.  Constitutional:      Appearance: He is well-developed.  HENT:     Head: Normocephalic.     Nose: Nose normal.  Eyes:     General: No scleral icterus.  Conjunctiva/sclera: Conjunctivae normal.  Neck:     Thyroid: No thyromegaly.  Cardiovascular:     Rate and Rhythm: Regular rhythm. Tachycardia present.     Heart sounds: No murmur heard.  No friction rub. No gallop.   Pulmonary:     Breath sounds: No stridor. No wheezing or rales.  Chest:     Chest wall: No tenderness.  Abdominal:     General: There is no distension.     Tenderness: There is no abdominal tenderness. There is no rebound.  Musculoskeletal:        General: Normal range of motion.     Cervical back: Neck supple.  Lymphadenopathy:     Cervical: No cervical adenopathy.  Skin:    Findings: No erythema or rash.  Neurological:     Mental Status: He is alert and oriented to person, place, and time.     Motor: No abnormal muscle tone.     Coordination: Coordination normal.  Psychiatric:        Behavior: Behavior normal.     ED Results / Procedures / Treatments   Labs (all labs ordered are listed, but only abnormal results are displayed) Labs Reviewed  BASIC METABOLIC PANEL - Abnormal; Notable for the following components:      Result Value   Sodium  133 (*)    Potassium 2.8 (*)    Glucose, Bld 175 (*)    Calcium 8.2 (*)    All other components within normal limits  CBG MONITORING, ED - Abnormal; Notable for the following components:   Glucose-Capillary 147 (*)    All other components within normal limits  RESPIRATORY PANEL BY RT PCR (FLU A&B, COVID)  CBC  DIFFERENTIAL  HEPATIC FUNCTION PANEL  URINALYSIS, ROUTINE W REFLEX MICROSCOPIC  MAGNESIUM  TROPONIN I (HIGH SENSITIVITY)  TROPONIN I (HIGH SENSITIVITY)    EKG EKG Interpretation  Date/Time:  Wednesday November 08 2019 17:57:05 EDT Ventricular Rate:  64 PR Interval:    QRS Duration: 107 QT Interval:  447 QTC Calculation: 462 R Axis:   -48 Text Interpretation: Sinus rhythm LAD, consider left anterior fascicular block Abnormal R-wave progression, early transition Nonspecific T abnormalities, anterior leads Confirmed by Milton Ferguson [phone]) on 11/08/2019 6:08:27 PM   Radiology DG Chest 1 View  Result Date: 11/08/2019 CLINICAL DATA:  Chest pain with shortness of breath and weakness. EXAM: CHEST  1 VIEW.  The patient is rotated. COMPARISON:  Chest x-ray 02/03/2015, CT chest 06/14/2017 FINDINGS: The heart size and mediastinal contours are within normal limits. No focal consolidation. No pulmonary edema. No pleural effusion. No pneumothorax. No acute osseous abnormality. IMPRESSION: No active disease. Electronically Signed   By: Iven Finn M.D.   On: 11/08/2019 18:43   CT Head Wo Contrast  Result Date: 11/08/2019 CLINICAL DATA:  Dizziness, cerebral hemorrhage suspected. EXAM: CT HEAD WITHOUT CONTRAST TECHNIQUE: Contiguous axial images were obtained from the base of the skull through the vertex without intravenous contrast. COMPARISON:  CT head 06/23/2019 FINDINGS: Brain: No evidence of large-territorial acute infarction. No parenchymal hemorrhage. No mass lesion. No extra-axial collection. No mass effect or midline shift. No hydrocephalus. Basilar cisterns are patent.  Vascular: No hyperdense vessel. Atherosclerotic calcifications are present within the cavernous internal carotid arteries. Skull: No acute fracture or focal lesion. Sinuses/Orbits: Paranasal sinuses and mastoid air cells are clear. The orbits are unremarkable. Other: None. IMPRESSION: No acute intracranial abnormality. Electronically Signed   By: Iven Finn M.D.   On: 11/08/2019 18:42  Procedures Procedures (including critical care time)  Medications Ordered in ED Medications  sodium chloride 0.9 % bolus 1,000 mL (0 mLs Intravenous Stopped 11/08/19 2105)  potassium chloride 10 mEq in 100 mL IVPB (0 mEq Intravenous Stopped 11/08/19 2152)  potassium chloride 10 mEq in 100 mL IVPB (0 mEq Intravenous Stopped 11/08/19 2135)  potassium chloride SA (KLOR-CON) CR tablet 40 mEq (40 mEq Oral Given 11/08/19 1957)    ED Course  I have reviewed the triage vital signs and the nursing notes.  Pertinent labs & imaging results that were available during my care of the patient were reviewed by me and considered in my medical decision making (see chart for details).    MDM Rules/Calculators/A&P                          Patient with dizziness and episodes of tachycardia.  He also has hypokalemia.  He will be admitted to obs with possible cardiology consult tomorrow        This patient presents to the ED for concern of dizziness this involves an extensive number of treatment options, and is a complaint that carries with it a high risk of complications and morbidity.  The differential diagnosis includes anemia arrhythmia   Lab Tests:   I Ordered, reviewed, and interpreted labs, which included CBC chemistries show hypokalemia  Medicines ordered:   I ordered medication IV fluids for dehydration  Imaging Studies ordered:   I ordered imaging studies which included CT head and chest x-ray  I independently visualized and interpreted imaging which showed unremarkable  Additional history  obtained:   Additional history obtained from record  Previous records obtained and reviewed.  Consultations Obtained:   I consulted hospice and discussed lab and imaging findings  Reevaluation:  After the interventions stated above, I reevaluated the patient and found no change  Critical Interventions:  .   Final Clinical Impression(s) / ED Diagnoses Final diagnoses:  Chest pain    Rx / DC Orders ED Discharge Orders    None       Milton Ferguson, MD 11/12/19 [phone]

## 2019-11-09 ENCOUNTER — Observation Stay (HOSPITAL_COMMUNITY): Payer: Medicare PPO

## 2019-11-09 ENCOUNTER — Encounter (HOSPITAL_COMMUNITY): Payer: Self-pay | Admitting: Internal Medicine

## 2019-11-09 DIAGNOSIS — Z20822 Contact with and (suspected) exposure to covid-19: Secondary | ICD-10-CM | POA: Diagnosis present

## 2019-11-09 DIAGNOSIS — Z885 Allergy status to narcotic agent status: Secondary | ICD-10-CM | POA: Diagnosis not present

## 2019-11-09 DIAGNOSIS — I340 Nonrheumatic mitral (valve) insufficiency: Secondary | ICD-10-CM | POA: Diagnosis not present

## 2019-11-09 DIAGNOSIS — I6523 Occlusion and stenosis of bilateral carotid arteries: Secondary | ICD-10-CM | POA: Diagnosis not present

## 2019-11-09 DIAGNOSIS — E876 Hypokalemia: Secondary | ICD-10-CM | POA: Diagnosis present

## 2019-11-09 DIAGNOSIS — I10 Essential (primary) hypertension: Secondary | ICD-10-CM

## 2019-11-09 DIAGNOSIS — R7303 Prediabetes: Secondary | ICD-10-CM

## 2019-11-09 DIAGNOSIS — E871 Hypo-osmolality and hyponatremia: Secondary | ICD-10-CM | POA: Diagnosis present

## 2019-11-09 DIAGNOSIS — Z833 Family history of diabetes mellitus: Secondary | ICD-10-CM | POA: Diagnosis not present

## 2019-11-09 DIAGNOSIS — R42 Dizziness and giddiness: Secondary | ICD-10-CM | POA: Diagnosis not present

## 2019-11-09 DIAGNOSIS — Z8546 Personal history of malignant neoplasm of prostate: Secondary | ICD-10-CM | POA: Diagnosis not present

## 2019-11-09 DIAGNOSIS — I110 Hypertensive heart disease with heart failure: Secondary | ICD-10-CM | POA: Diagnosis present

## 2019-11-09 DIAGNOSIS — Z8249 Family history of ischemic heart disease and other diseases of the circulatory system: Secondary | ICD-10-CM | POA: Diagnosis not present

## 2019-11-09 DIAGNOSIS — I471 Supraventricular tachycardia: Secondary | ICD-10-CM | POA: Diagnosis present

## 2019-11-09 DIAGNOSIS — Z79899 Other long term (current) drug therapy: Secondary | ICD-10-CM | POA: Diagnosis not present

## 2019-11-09 DIAGNOSIS — I5030 Unspecified diastolic (congestive) heart failure: Secondary | ICD-10-CM | POA: Diagnosis present

## 2019-11-09 DIAGNOSIS — B9620 Unspecified Escherichia coli [E. coli] as the cause of diseases classified elsewhere: Secondary | ICD-10-CM | POA: Diagnosis present

## 2019-11-09 DIAGNOSIS — R000 Tachycardia, unspecified: Secondary | ICD-10-CM

## 2019-11-09 DIAGNOSIS — N390 Urinary tract infection, site not specified: Secondary | ICD-10-CM | POA: Diagnosis present

## 2019-11-09 DIAGNOSIS — E7439 Other disorders of intestinal carbohydrate absorption: Secondary | ICD-10-CM | POA: Diagnosis present

## 2019-11-09 DIAGNOSIS — D649 Anemia, unspecified: Secondary | ICD-10-CM | POA: Diagnosis present

## 2019-11-09 DIAGNOSIS — Z8551 Personal history of malignant neoplasm of bladder: Secondary | ICD-10-CM | POA: Diagnosis not present

## 2019-11-09 DIAGNOSIS — R55 Syncope and collapse: Secondary | ICD-10-CM | POA: Diagnosis not present

## 2019-11-09 DIAGNOSIS — E785 Hyperlipidemia, unspecified: Secondary | ICD-10-CM | POA: Diagnosis present

## 2019-11-09 DIAGNOSIS — I4892 Unspecified atrial flutter: Secondary | ICD-10-CM | POA: Diagnosis present

## 2019-11-09 DIAGNOSIS — Z91030 Bee allergy status: Secondary | ICD-10-CM | POA: Diagnosis not present

## 2019-11-09 DIAGNOSIS — K219 Gastro-esophageal reflux disease without esophagitis: Secondary | ICD-10-CM | POA: Diagnosis present

## 2019-11-09 DIAGNOSIS — F17210 Nicotine dependence, cigarettes, uncomplicated: Secondary | ICD-10-CM | POA: Diagnosis present

## 2019-11-09 DIAGNOSIS — R9431 Abnormal electrocardiogram [ECG] [EKG]: Secondary | ICD-10-CM | POA: Diagnosis not present

## 2019-11-09 DIAGNOSIS — E2740 Unspecified adrenocortical insufficiency: Secondary | ICD-10-CM | POA: Diagnosis present

## 2019-11-09 DIAGNOSIS — I951 Orthostatic hypotension: Secondary | ICD-10-CM | POA: Diagnosis present

## 2019-11-09 LAB — URINALYSIS, ROUTINE W REFLEX MICROSCOPIC
Bilirubin Urine: NEGATIVE
Glucose, UA: NEGATIVE mg/dL
Hgb urine dipstick: NEGATIVE
Ketones, ur: NEGATIVE mg/dL
Nitrite: NEGATIVE
Protein, ur: NEGATIVE mg/dL
Specific Gravity, Urine: 1.018 (ref 1.005–1.030)
pH: 5 (ref 5.0–8.0)

## 2019-11-09 LAB — COMPREHENSIVE METABOLIC PANEL
ALT: 13 U/L (ref 0–44)
AST: 14 U/L — ABNORMAL LOW (ref 15–41)
Albumin: 3.2 g/dL — ABNORMAL LOW (ref 3.5–5.0)
Alkaline Phosphatase: 46 U/L (ref 38–126)
Anion gap: 8 (ref 5–15)
BUN: 17 mg/dL (ref 8–23)
CO2: 24 mmol/L (ref 22–32)
Calcium: 8 mg/dL — ABNORMAL LOW (ref 8.9–10.3)
Chloride: 103 mmol/L (ref 98–111)
Creatinine, Ser: 0.94 mg/dL (ref 0.61–1.24)
GFR, Estimated: >60 mL/min (ref >60)
Glucose, Bld: 138 mg/dL — ABNORMAL HIGH (ref 70–99)
Potassium: 3.2 mmol/L — ABNORMAL LOW (ref 3.5–5.1)
Sodium: 135 mmol/L (ref 135–145)
Total Bilirubin: 0.8 mg/dL (ref 0.3–1.2)
Total Protein: 6 g/dL — ABNORMAL LOW (ref 6.5–8.1)

## 2019-11-09 LAB — ECHOCARDIOGRAM COMPLETE
AR max vel: 2.62 cm2
AV Area VTI: 2.77 cm2
AV Area mean vel: 2.7 cm2
AV Mean grad: 2.4 mmHg
AV Peak grad: 4.9 mmHg
Ao pk vel: 1.1 m/s
Area-P 1/2: 4.41 cm2
Height: 75 in
S' Lateral: 3.14 cm
Weight: 3280 oz

## 2019-11-09 LAB — CBC
HCT: 38.4 % — ABNORMAL LOW (ref 39.0–52.0)
Hemoglobin: 12.5 g/dL — ABNORMAL LOW (ref 13.0–17.0)
MCH: 30.9 pg (ref 26.0–34.0)
MCHC: 32.6 g/dL (ref 30.0–36.0)
MCV: 95 fL (ref 80.0–100.0)
Platelets: 180 10*3/uL (ref 150–400)
RBC: 4.04 MIL/uL — ABNORMAL LOW (ref 4.22–5.81)
RDW: 13.1 % (ref 11.5–15.5)
WBC: 6.2 10*3/uL (ref 4.0–10.5)
nRBC: 0 % (ref 0.0–0.2)

## 2019-11-09 LAB — PHOSPHORUS: Phosphorus: 3.4 mg/dL (ref 2.5–4.6)

## 2019-11-09 LAB — PROTIME-INR
INR: 1.2 (ref 0.8–1.2)
Prothrombin Time: 14.4 seconds (ref 11.4–15.2)

## 2019-11-09 LAB — APTT: aPTT: 35 seconds (ref 24–36)

## 2019-11-09 LAB — MAGNESIUM: Magnesium: 1.8 mg/dL (ref 1.7–2.4)

## 2019-11-09 MED ORDER — POTASSIUM CHLORIDE CRYS ER 20 MEQ PO TBCR
40.0000 meq | EXTENDED_RELEASE_TABLET | ORAL | Status: AC
  Administered 2019-11-09 (×2): 40 meq via ORAL
  Filled 2019-11-09 (×3): qty 2

## 2019-11-09 MED ORDER — SODIUM CHLORIDE 0.9 % IV SOLN: Freq: Once | INTRAVENOUS | Status: AC

## 2019-11-09 MED ORDER — TRAZODONE HCL 50 MG PO TABS
100.0000 mg | ORAL_TABLET | Freq: Every day | ORAL | Status: DC
  Administered 2019-11-09: 100 mg via ORAL
  Filled 2019-11-09: qty 2

## 2019-11-09 MED ORDER — AMLODIPINE BESYLATE 5 MG PO TABS
10.0000 mg | ORAL_TABLET | Freq: Every day | ORAL | Status: DC
  Administered 2019-11-09 – 2019-11-10 (×2): 10 mg via ORAL
  Filled 2019-11-09 (×2): qty 2

## 2019-11-09 MED ORDER — NICOTINE 21 MG/24HR TD PT24
21.0000 mg | MEDICATED_PATCH | Freq: Every day | TRANSDERMAL | Status: DC
  Filled 2019-11-09 (×3): qty 1

## 2019-11-09 MED ORDER — SODIUM CHLORIDE 0.9 % IV SOLN
1.0000 g | INTRAVENOUS | Status: DC
  Administered 2019-11-09: 1 g via INTRAVENOUS
  Filled 2019-11-09: qty 10

## 2019-11-09 MED ORDER — ACETAMINOPHEN 325 MG PO TABS: 650.0000 mg | ORAL_TABLET | Freq: Four times a day (QID) | ORAL | Status: DC | PRN

## 2019-11-09 MED ORDER — SODIUM CHLORIDE 0.9 % IV SOLN: INTRAVENOUS | Status: DC

## 2019-11-09 MED ORDER — ALPRAZOLAM 0.5 MG PO TABS: 0.5000 mg | ORAL_TABLET | Freq: Three times a day (TID) | ORAL | Status: DC | PRN

## 2019-11-09 NOTE — Progress Notes (Signed)
Orthostatic VS completed on patient on admission.  Lying 149/91 Pulse 55, Sitting 158/87 Pulse 57 Standing 0 minutes 128/88 Pulse 67 Standing 3 minutes 156/82 Pulse 62  Ordering prescriber made aware of results.

## 2019-11-09 NOTE — Care Management Obs Status (Signed)
[place] NOTIFICATION   Patient Details  Name: Matthew Adkins MRN: [id number] Date of Birth: 1950/08/18   Medicare Observation Status Notification Given:  Yes    Tommy Medal 11/09/2019, 3:51 PM

## 2019-11-09 NOTE — ED Notes (Signed)
Pt ambulated to bathroom with standby assist. Pt walking cautiously and stating, "I feel slightly off, almost as if I'm learning to walk again."  Pt ambulated up and down hall, and around his room twice. No c/o dizziness or lightheadedness. Pt stated that the more he walked, the better he felt.  HR also remained between 55-75 bpm.

## 2019-11-09 NOTE — Consult Note (Addendum)
Cardiology Consult    Patient ID: Matthew Adkins; [id number]; [birth year]   Admit date: 11/08/2019 Date of Consult: 11/09/2019  Primary Care Provider: Fayrene Helper, MD Primary Cardiologist: New to [facility] - Dr. Harl Bowie  Patient Profile    Matthew Adkins is a 68 y.o. 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 Josephine Cables.   History of Present Illness    Matthew Adkins presented to [facility] ED on 11/08/2019 for evaluation of worsening dizziness which had started earlier that afternoon. Orthostatics were checked by EMS and BP dropped from 153/74 to 109/61 with standing by review of notes. In talking with the patient today he reports being in his usual state of health until yesterday afternoon. He did receive the flu shot the day prior to admission but did not experience acute side effects after this. Says that starting yesterday afternoon, he developed dizziness while driving home and says he "was zoned out".  Upon arriving home, he initially felt like his symptoms had improved but had recurrent significant weakness and felt like he might pass out. Not associated with positional changes. He denies any actual syncopal episodes. No associated chest pain or palpitations. No recent headaches or vision changes. He reports staying hydrated and denies any recent changes in his appetite. No new medications. He does report being under increased stress as he is the primary caregiver for his wife who has dementia and his sister who also has dementia. He does consume approximately 1 shot of liquor a day but denies any binge drinking. Also consumes occasional coffee and soda but no acute changes in this. No known history of CAD, CHF or cardiac arrhythmias.    Initial labs show WBC 6.5, Hgb 13.5, platelets 210, Na+ 133, K+ 2.8 and creatinine 1.15. Mg 1.8. Initial HS  Troponin 4 with repeat of 4. COVID negative. CXR with no active disease. CT Head with no acute intracranial abnormalities. EKG showed NSR, HR 64 with left axis deviation and TWI along the anterior leads.   By review of telemetry, he has experienced intermittent episodes of tachycardia with the longest episode lasting from 2200 - 2215 on 11/08/2019 and heart rate was elevated into the 120's at that time. P waves are not identifiable and the rhythm itself overall appears most concerning for 2:1 atrial flutter. He did convert back to normal sinus rhythm and has been in sinus bradycardia since with heart rate in the 50's to 60's. No evidence of high-grade AV block. Orthostatics were checked again yesterday evening after receiving IVF and negative at that time. K+ still low at 3.2 this AM and additional replacement has been ordered.    Past Medical History:  Diagnosis Date  . Bladder cancer ([place]) 2000  . Depression   . Essential hypertension 01/16/2018  . Nicotine addiction   . Prostate cancer ([place]) 07/06/2014  . Seasonal allergies     Past Surgical History:  Procedure Laterality Date  . athroscopy of right knee  2002  . cancerous polyps removed from bladder  2000  . COLONOSCOPY  MJ 2009 pmhX: POLYPS   POLYP REMOVED BUT NOT RETRIEVED  . COLONOSCOPY  LS 2007 ARS   ? POLYPS, no path available  . COLONOSCOPY  09/26/2010   sessile poylp(2) pan-colonic diverticulosis,mild/internal hemorrhoids  . COLONOSCOPY WITH PROPOFOL N/A 02/09/2017   Procedure: COLONOSCOPY WITH PROPOFOL;  Surgeon: Danie Binder, MD;  Location: [facility];  Service: Endoscopy;  Laterality: N/A;  8:30am  . ESOPHAGOGASTRODUODENOSCOPY (EGD) WITH PROPOFOL N/A 02/09/2017   Procedure: ESOPHAGOGASTRODUODENOSCOPY (EGD) WITH PROPOFOL;  Surgeon: Danie Binder, MD;  Location: [facility];  Service: Endoscopy;  Laterality: N/A;  . FOOT SURGERY Bilateral 2004  . HERNIA REPAIR  1980's   left inguinal hernia  . LYMPHADENECTOMY Bilateral  09/03/2014   Procedure: PELVIC LYMPHADENECTOMY;  Surgeon: Raynelle Bring, MD;  Location: [facility];  Service: Urology;  Laterality: Bilateral;  . POLYPECTOMY  02/09/2017   Procedure: POLYPECTOMY;  Surgeon: Danie Binder, MD;  Location: [facility];  Service: Endoscopy;;  cecal polyp, hepatic flexure polyps x3, transverse colon polyp, descending colon polyps x2, rectal polyps x2  . PROSTATE BIOPSY  07/06/14  . ROBOT ASSISTED LAPAROSCOPIC RADICAL PROSTATECTOMY N/A 09/03/2014   Procedure: ROBOTIC ASSISTED LAPAROSCOPIC RADICAL PROSTATECTOMY LEVEL 2;  Surgeon: Raynelle Bring, MD;  Location: [facility];  Service: Urology;  Laterality: N/A;     Home Medications:  Prior to Admission medications   Medication Sig Start Date End Date Taking? Authorizing Provider  acetaminophen (TYLENOL) 500 MG tablet Take 1,000 mg by mouth every 6 (six) hours as needed for mild pain.   Yes [provider]  amLODipine (NORVASC) 10 MG tablet Take 1 tablet (10 mg total) by mouth daily. 08/21/19  Yes Fayrene Helper, MD  buPROPion [facility] SR) 150 MG 12 hr tablet Take 1 tablet (150 mg total) by mouth 2 (two) times daily. 11/21/18  Yes Fayrene Helper, MD  cyclobenzaprine (FLEXERIL) 10 MG tablet Take 1 tablet (10 mg total) by mouth at bedtime. 02/17/18  Yes Fayrene Helper, MD  diazepam (VALIUM) 2 MG tablet Take 1 tablet (2 mg total) by mouth every 6 (six) hours as needed for muscle spasms. 06/23/19  Yes Daleen Bo, MD  FLUoxetine (PROZAC) 20 MG capsule TAKE 1 CAPSULE BY MOUTH DAILY. 08/28/19  Yes Fayrene Helper, MD  HYDROcodone-acetaminophen (NORCO/VICODIN) 5-325 MG tablet TAKE ONE TABLET BY MOUTH EVERY 6 HOURS AS NEEDED Patient not taking: Reported on 11/08/2019 09/20/19   Carole Civil, MD  omeprazole (PRILOSEC) 20 MG capsule TAKE 1 CAPSULE BY MOUTH 30 MINUTES PRIOR TO BREAKFAST. Patient taking differently: Take 20 mg by mouth See admin instructions. TAKE 1 CAPSULE BY MOUTH 30 MINUTES PRIOR TO  BREAKFAST. 05/25/19  Yes Carlis Stable, NP  dextromethorphan (DELSYM) 30 MG/5ML liquid Take 2.5 mLs (15 mg total) by mouth 2 (two) times daily. Patient not taking: Reported on 11/08/2019 05/19/19   Perlie Mayo, NP  Multiple Vitamins-Minerals (ONE DAILY MENS 50+ MULTIVIT PO) Take 1 tablet by mouth daily.    [provider]    Inpatient Medications: Scheduled Meds: . amLODipine  10 mg Oral Daily  . enoxaparin (LOVENOX) injection  40 mg Subcutaneous Q24H  . nicotine  21 mg Transdermal Daily  . pantoprazole  40 mg Oral Daily  . potassium chloride  40 mEq Oral Q3H   Continuous Infusions:  PRN Meds: acetaminophen  Allergies:    Allergies  Allergen Reactions  . Bee Venom   . Codeine Nausea And Vomiting    Social History:   Social History   Socioeconomic History  . Marital status: Married    Spouse name: Not on file  . Number of children: 2  . Years of education: Not on file  . Highest education level: Not on file  Occupational History  . Occupation: unemployed  Tobacco Use  . Smoking status: Current Every Day Smoker    Packs/day: 1.00    Years: 30.00    Pack years: 30.00    Types: Cigarettes  . Smokeless tobacco: Never Used  Vaping Use  . Vaping Use: Never used  Substance and Sexual Activity  . Alcohol use: Yes    Comment: 3 pints/month  . Drug use: Yes    Types: Marijuana    Comment: occassionally  . Sexual activity: Yes    Birth control/protection: None  Other Topics Concern  . Not on file  Social History Narrative  . Not on file   Social Determinants of Health   Financial Resource Strain: Low Risk   . Difficulty of Paying Living Expenses: Not very hard  Food Insecurity:   . Worried About Charity fundraiser in the Last Year: Not on file  . Ran Out of Food in the Last Year: Not on file  Transportation Needs:   . Lack of Transportation (Medical): Not on file  . Lack of Transportation (Non-Medical): Not on file  Physical Activity:   . Days of  Exercise per Week: Not on file  . Minutes of Exercise per Session: Not on file  Stress: No Stress Concern Present  . Feeling of Stress : Only a little  Social Connections: Moderately Integrated  . Frequency of Communication with Friends and Family: Twice a week  . Frequency of Social Gatherings with Friends and Family: Once a week  . Attends Religious Services: 1 to 4 times per year  . Active Member of Clubs or Organizations: No  . Attends Archivist Meetings: Never  . Marital Status: Married  Human resources officer Violence:   . Fear of Current or Ex-Partner: Not on file  . Emotionally Abused: Not on file  . Physically Abused: Not on file  . Sexually Abused: Not on file     Family History:    Family History  Problem Relation Age of Onset  . Heart attack Father   . Diabetes Sister   . Hypertension Sister   . Hypertension Sister   . Aneurysm Sister        brain   . Arthritis Other        family history   . Colon cancer Neg Hx   . Colon polyps Neg Hx       Review of Systems    General:  No chills, fever, night sweats or weight changes. Positive for dizziness. Cardiovascular:  No chest pain, dyspnea on exertion, edema, orthopnea, palpitations, paroxysmal nocturnal dyspnea. Dermatological: No rash, lesions/masses Respiratory: No cough, dyspnea Urologic: No hematuria, dysuria Abdominal:   No nausea, vomiting, diarrhea, bright red blood per rectum, melena, or hematemesis Neurologic:  No visual changes or changes in mental status.  All other systems reviewed and are otherwise negative except as noted above.  Physical Exam/Data    Vitals:   11/09/19 0800 11/09/19 0949 11/09/19 1009 11/09/19 1103  BP: 140/68 (!) 148/71 (!) 149/86 (!) 151/94  Pulse: (!) 52  (!) 59 (!) 59  Resp: 16  16 15   Temp:      TempSrc:      SpO2: 97%  96% 97%  Weight:      Height:       No intake or output data in the 24 hours ending 11/09/19 1119 Filed Weights   11/08/19 1756  Weight:  93 kg   Body mass index is 25.62 kg/m.   General: Pleasant male appearing  in NAD Psych: Normal affect. Neuro: Alert and oriented X 3. Moves all extremities spontaneously. HEENT: Normal  Neck: Supple without bruits or JVD. Lungs:  Resp regular and unlabored, CTA without wheezing or rales. Heart: Regular rhythm, bradycardiac rate. no s3, s4, 2/6 SEM along RUSB.  Abdomen: Soft, non-tender, non-distended, BS + x 4.  Extremities: No clubbing, cyanosis or lower extremity edema. DP/PT/Radials 2+ and equal bilaterally.   EKG:  The EKG was personally reviewed and demonstrates: NSR, HR 64 with left axis deviation and TWI along the anterior leads.    Labs/Studies     Relevant CV Studies:  Echocardiogram: Pending  Laboratory Data:  Chemistry Recent Labs  Lab 11/08/19 1807 11/09/19 0424  NA 133* 135  K 2.8* 3.2*  CL 101 103  CO2 25 24  GLUCOSE 175* 138*  BUN 15 17  CREATININE 1.15 0.94  CALCIUM 8.2* 8.0*  GFRNONAA >60 >60  ANIONGAP 7 8    Recent Labs  Lab 11/08/19 1807 11/09/19 0424  PROT 6.7 6.0*  ALBUMIN 3.6 3.2*  AST 17 14*  ALT 15 13  ALKPHOS 51 46  BILITOT 0.6 0.8   Hematology Recent Labs  Lab 11/08/19 1807 11/09/19 0424  WBC 6.5 6.2  RBC 4.36 4.04*  HGB 13.5 12.5*  HCT 40.2 38.4*  MCV 92.2 95.0  MCH 31.0 30.9  MCHC 33.6 32.6  RDW 12.9 13.1  PLT 210 180   Cardiac EnzymesNo results for input(s): TROPONINI in the last 168 hours. No results for input(s): TROPIPOC in the last 168 hours.  BNPNo results for input(s): BNP, PROBNP in the last 168 hours.  DDimer No results for input(s): DDIMER in the last 168 hours.  Radiology/Studies:  DG Chest 1 View  Result Date: 11/08/2019 CLINICAL DATA:  Chest pain with shortness of breath and weakness. EXAM: CHEST  1 VIEW.  The patient is rotated. COMPARISON:  Chest x-ray 02/03/2015, CT chest 06/14/2017 FINDINGS: The heart size and mediastinal contours are within normal limits. No focal consolidation. No pulmonary  edema. No pleural effusion. No pneumothorax. No acute osseous abnormality. IMPRESSION: No active disease. Electronically Signed   By: Iven Finn M.D.   On: 11/08/2019 18:43   CT Head Wo Contrast  Result Date: 11/08/2019 CLINICAL DATA:  Dizziness, cerebral hemorrhage suspected. EXAM: CT HEAD WITHOUT CONTRAST TECHNIQUE: Contiguous axial images were obtained from the base of the skull through the vertex without intravenous contrast. COMPARISON:  CT head 06/23/2019 FINDINGS: Brain: No evidence of large-territorial acute infarction. No parenchymal hemorrhage. No mass lesion. No extra-axial collection. No mass effect or midline shift. No hydrocephalus. Basilar cisterns are patent. Vascular: No hyperdense vessel. Atherosclerotic calcifications are present within the cavernous internal carotid arteries. Skull: No acute fracture or focal lesion. Sinuses/Orbits: Paranasal sinuses and mastoid air cells are clear. The orbits are unremarkable. Other: None. IMPRESSION: No acute intracranial abnormality. Electronically Signed   By: Iven Finn M.D.   On: 11/08/2019 18:42     Assessment & Plan    1. Dizziness/Intermittent Tachycardia  - Developed acute onset dizziness yesterday afternoon and says he felt "zoned out". Does report intermittent diaphoresis but no associated chest pain or palpitations. Was orthostatic upon EMS arrival but negative by recent check. Will ask for repeat orthostatics today now that he is off IVF. Was hypokalemic on admission which is being corrected. Echocardiogram is pending to assess for any structural abnormalities.  - By review of telemetry, he has experienced at least two episodes of tachycardia this admission with HR  into the 120's and he denies any associated symptoms with this. Will review telemetry with MD but appears most concerning for a 2:1 atrial flutter. He did self-convert and has been back in NSR with HR in the 50's to 60's. Would not plan to initiate AV nodal blocking  agents given his baseline bradycardia. Would consider a 2-week monitor to assess for recurrence now that his electrolytes have been corrected. If felt to be consistent with atrial flutter, anticoagulation would be indicated given his CHA2DS2-VASc Score of at least 3 (HTN, Aortic Plaque, Age).  2. HTN - BP has been variable from 102/64 - 151/94 while in the ED. He has been continued on PTA Amlodipine 10mg  daily.   3. Prediabetes - Hgb A1c at 5.9 in 08/2019. Followed by PCP as an outpatient.   4. Hypokalemia - K+ 2.8 on admission and improved to 3.2 this AM. Additional replacement has been ordered.   For questions or updates, please contact [place] Please consult [email or web address] for contact info under Cardiology/STEMI.  Signed, Erma Heritage, PA-C 11/09/2019, 11:19 AM Pager: [phone]   Attending note Patient seen and discussed with PA Ahmed Prima, I agree with her documentation. 69 yo male history of HTN, prediabetes presented with dizziness.   K 2.8 Cr 1.15 BUN 15 WBC 6.5 Hgb 13.5 Plt 210 Mg 1.8 hstrop 4-->4 COVID neg CXR no acute process CT head no acute process EKG NSR  Orthostatics done at 2153 normal by bp. Reported HR from 70 sitting to 130 with standing at 3 minutes.Of note orthostatics were after a L NS bolus, down trends in Cr and CBC cell lines with fluids would suggest he was likely dry on presentation.  Able to pull up the EMS runs sheet, but there numbers his SBP dropped 44 points from sitting to standing, DBP dropped 13 points. He was also reportedly symptomatic. Family had indicated to EMS poor oral intake.   In ER episode of narrow complex tachycardia 130s to 140s, not able to discern rhythm from tele strip, perhals aflutter vs an atach. Unclear how much of episode was orthostasis and if any of symptoms could have been arrhythmia related.   Follow tele overnight, likely outpatient monitor at discharge tomorrow.   Carlyle Dolly MD

## 2019-11-09 NOTE — ED Notes (Signed)
Pt provided with diet ginger ale, crackers and peanut butter. No other requests at this time. Call bell within reach, bed in low position. Will continue to monitor.

## 2019-11-09 NOTE — Progress Notes (Signed)
    I was notified by the patient's nurse that his orthostatic vitals were positive as SBP dropped by more than 30 points when going from sitting to standing. Will restart IVF.   Signed, Erma Heritage, PA-C 11/09/2019, 3:30 PM Pager: [phone]

## 2019-11-09 NOTE — Progress Notes (Signed)
*  PRELIMINARY RESULTS* Echocardiogram 2D Echocardiogram has been performed.  Matthew Adkins 11/09/2019, 11:26 AM

## 2019-11-09 NOTE — Progress Notes (Signed)
Pt admitted to the unit at 1200. Pt mental status is Alert. Pt oriented to room, staff, and call bell. Skin is intact. Full assessment charted in [facility]. Call bell within reach. Visitor guidelines reviewed w/ pt and/or family.

## 2019-11-09 NOTE — Progress Notes (Addendum)
Patient Demographics:    Matthew Adkins, is a 69 y.o. male, DOB - 1950/12/07, XNT:[id number]  Admit date - 11/08/2019   Admitting Physician Bernadette Hoit, DO  Outpatient Primary MD for the patient is Fayrene Helper, MD  LOS - 0   Chief Complaint  Patient presents with  . Dizziness        Subjective:    Matthew Adkins today has no fevers, no emesis,  No chest pain,    -No dyspnea no pleuritic symptoms --11/09/19--Patient continues to be dizzy/orthostatic and symptomatic despite IV fluids---we will continue IV fluids  And replace potassium due to persistent orthostatic dizziness  Assessment  & Plan :    Principal Problem:   Dizziness Active Problems:   Prediabetes   NICOTINE ADDICTION   Arthritis of knee, degenerative   Degenerative disc disease, lumbar   GERD (gastroesophageal reflux disease)   Essential hypertension   Hypokalemia  Brief Summary:- 69 y.o. male with medical history significant for hypertension, hyperlipidemia, osteoarthritis, DDD,  GERD, prediabetes, tobacco use and history of bladder cancer (diagnosed on TURBT in 2000 by Dr. [place] Pink) in remission and history of prostate cancer s/p surgical therapy (robotic assisted laparoscopic radical prostatectomy level 2) with a unilateral left nerve sparing robot-assisted laparoscopic radical prostatectomy and pelvic lymphadenectomy (August 2016) admitted on 11/08/2019 with dizziness and found to have orthostatic hypotension as well as telemetry/EKG findings suggestive of atrial tachycardia Vs 2:1 atrial flutter   A/p 1)Dizziness/Intermittent Tachycardia -- ??? 2:1 Atrial Flutter Vs atrial tachycardia, Cardiology consult appreciated, echo noted --Orthostatic dizziness noted and persist -Per EMS runs sheet-- on 96/7/59 Systolic blood pressure dropped 44 points from sitting to standing and diastolic blood pressure dropped 13 points,  patient was dizzy/symptomatic with these orthostatic drop in blood pressure -Repeat orthostatic vitals in the ED on 11/08/2019 at 9:53 PM revealed that patient was still orthostatic by heart rate but not by BP -Patient heart rate went from 70 to 130 within 3 min of standing--please note that this repeat orthostatic vitals were done after 1 L of IV normal saline was given -11/09/19--Patient continues to be dizzy/orthostatic and symptomatic despite IV fluids---we will continue IV fluids  And replace potassium due to persistent orthostatic dizziness -Carotid Artery Dopplers pending -Avoid AV nodal blocking agents at this time due to resting bradycardia  2)HFpEF--Echo with preserved EF of 60 to 65% with grade 1 diastolic dysfunction -Clinically patient appears somewhat dry, okay to hydrate gently  3)HypoKalemia--potassium was 2.8 up to 3.2 after replacement additional replacements requested -Magnesium WNL  4) glucose intolerance--A1c 5.9, Use Novolog/Humalog Sliding scale insulin with Accu-Cheks/Fingersticks as ordered   5) tobacco abuse--- nicotine patch is ordered  6)HTN--orthostatic hypotension noted currently on amlodipine  7) acute anemia--most likely hemodilution, hemoglobin down to 12.5 from 13.5 on admission, no bleeding concerns  8) possible UTI--- IV Rocephin pending cultures  Disposition/Need for in-Hospital Stay- patient unable to be discharged at this time due to --persistent orthostatic dizziness requiring IV fluids and electrolyte replacements, also UTI requiring IV antibiotics  Status is: obs  Disposition: The patient is from: Home              Anticipated d/c is to: Home  Anticipated d/c date is: 1 day              Patient currently is not medically stable to d/c. Barriers: Not Clinically Stable-persistent orthostatic dizziness requiring IV fluids and electrolyte replacements, also UTI requiring IV antibiotics  Code Status : full code  Family Communication:    (patient is alert, awake and coherent)    Consults  : cardiology  DVT Prophylaxis  :  Lovenox -  - SCDs    Lab Results  Component Value Date   PLT 180 11/09/2019    Inpatient Medications  Scheduled Meds: . amLODipine  10 mg Oral Daily  . enoxaparin (LOVENOX) injection  40 mg Subcutaneous Q24H  . nicotine  21 mg Transdermal Daily  . pantoprazole  40 mg Oral Daily   Continuous Infusions: . sodium chloride 100 mL/hr at 11/09/19 1640   PRN Meds:.acetaminophen    Anti-infectives (From admission, onward)   None        Objective:   Vitals:   11/09/19 1130 11/09/19 1145 11/09/19 1148 11/09/19 1209  BP:    (!) 158/87  Pulse: 70 (!) 57  (!) 57  Resp: 20 12  14   Temp:   98.5 F (36.9 C) 98.4 F (36.9 C)  TempSrc:   Oral Oral  SpO2: 97% 97%    Weight:      Height:        Wt Readings from Last 3 Encounters:  11/08/19 93 kg  08/21/19 90.8 kg  06/23/19 93 kg     Intake/Output Summary (Last 24 hours) at 11/09/2019 1659 Last data filed at 11/09/2019 1300 Gross per 24 hour  Intake 1370.76 ml  Output --  Net 1370.76 ml   Physical Exam  Gen:- Awake Alert, remains dizzy HEENT:- Joy.AT, No sclera icterus Neck-Supple Neck,No JVD,.  Lungs-  CTAB , fair symmetrical air movement CV- S1, S2 normal, regular  Abd-  +ve B.Sounds, Abd Soft, No tenderness, no CVA tenderness    Extremity/Skin:- No  edema, pedal pulses present  Psych-affect is appropriate, oriented x3 Neuro-orthostatic dizziness persist, no new focal deficits, no tremors   Data Review:   Micro Results Recent Results (from the past 240 hour(s))  Respiratory Panel by RT PCR (Flu A&B, Covid) - Nasopharyngeal Swab     Status: None   Collection Time: 11/08/19  6:35 PM   Specimen: Nasopharyngeal Swab  Result Value Ref Range Status   SARS Coronavirus 2 by RT PCR NEGATIVE NEGATIVE Final    Comment: (NOTE) SARS-CoV-2 target nucleic acids are NOT DETECTED.  The SARS-CoV-2 RNA is generally detectable in upper  respiratoy specimens during the acute phase of infection. The lowest concentration of SARS-CoV-2 viral copies this assay can detect is 131 copies/mL. A negative result does not preclude SARS-Cov-2 infection and should not be used as the sole basis for treatment or other patient management decisions. A negative result may occur with  improper specimen collection/handling, submission of specimen other than nasopharyngeal swab, presence of viral mutation(s) within the areas targeted by this assay, and inadequate number of viral copies (<131 copies/mL). A negative result must be combined with clinical observations, patient history, and epidemiological information. The expected result is Negative.  Fact Sheet for Patients:  PinkCheek.be  Fact Sheet for Healthcare Providers:  GravelBags.it  This test is no t yet approved or cleared by the [place] FDA and  has been authorized for detection and/or diagnosis of SARS-CoV-2 by FDA under an Emergency Use Authorization (EUA). This EUA will  remain  in effect (meaning this test can be used) for the duration of the COVID-19 declaration under Section 564(b)(1) of the Act, 21 U.S.C. section 360bbb-3(b)(1), unless the authorization is terminated or revoked sooner.     Influenza A by PCR NEGATIVE NEGATIVE Final   Influenza B by PCR NEGATIVE NEGATIVE Final    Comment: (NOTE) The Xpert Xpress SARS-CoV-2/FLU/RSV assay is intended as an aid in  the diagnosis of influenza from Nasopharyngeal swab specimens and  should not be used as a sole basis for treatment. Nasal washings and  aspirates are unacceptable for Xpert Xpress SARS-CoV-2/FLU/RSV  testing.  Fact Sheet for Patients: PinkCheek.be  Fact Sheet for Healthcare Providers: GravelBags.it  This test is not yet approved or cleared by the [place] FDA and  has been  authorized for detection and/or diagnosis of SARS-CoV-2 by  FDA under an Emergency Use Authorization (EUA). This EUA will remain  in effect (meaning this test can be used) for the duration of the  Covid-19 declaration under Section 564(b)(1) of the Act, 21  U.S.C. section 360bbb-3(b)(1), unless the authorization is  terminated or revoked. Performed at [facility], [address], [place],  [address]     Radiology Reports DG Chest 1 View  Result Date: 11/08/2019 CLINICAL DATA:  Chest pain with shortness of breath and weakness. EXAM: CHEST  1 VIEW.  The patient is rotated. COMPARISON:  Chest x-ray 02/03/2015, CT chest 06/14/2017 FINDINGS: The heart size and mediastinal contours are within normal limits. No focal consolidation. No pulmonary edema. No pleural effusion. No pneumothorax. No acute osseous abnormality. IMPRESSION: No active disease. Electronically Signed   By: Iven Finn M.D.   On: 11/08/2019 18:43   CT Head Wo Contrast  Result Date: 11/08/2019 CLINICAL DATA:  Dizziness, cerebral hemorrhage suspected. EXAM: CT HEAD WITHOUT CONTRAST TECHNIQUE: Contiguous axial images were obtained from the base of the skull through the vertex without intravenous contrast. COMPARISON:  CT head 06/23/2019 FINDINGS: Brain: No evidence of large-territorial acute infarction. No parenchymal hemorrhage. No mass lesion. No extra-axial collection. No mass effect or midline shift. No hydrocephalus. Basilar cisterns are patent. Vascular: No hyperdense vessel. Atherosclerotic calcifications are present within the cavernous internal carotid arteries. Skull: No acute fracture or focal lesion. Sinuses/Orbits: Paranasal sinuses and mastoid air cells are clear. The orbits are unremarkable. Other: None. IMPRESSION: No acute intracranial abnormality. Electronically Signed   By: Iven Finn M.D.   On: 11/08/2019 18:42   ECHOCARDIOGRAM COMPLETE  Result Date: 11/09/2019    ECHOCARDIOGRAM REPORT   Patient Name:    Matthew Adkins Date of Exam: 11/09/2019 Medical Rec #:  [id number]        Height:       75.0 in Accession #:    [id number]       Weight:       205.0 lb Date of Birth:  [birth year]        BSA:          2.218 m Patient Age:    51 years         BP:           140/68 mmHg Patient Gender: M                HR:           52 bpm. Exam Location:  [facility] Procedure: 2D Echo, Cardiac Doppler and Color Doppler Indications:    Abnormal ECG 794.31 / R94.31  History:  Patient has no prior history of Echocardiogram examinations.                 Risk Factors:Hypertension, Prediabetes, Dyslipidemia and Current                 Smoker. H/O prostate cancer.  Sonographer:    Alvino Chapel RCS Referring Phys: [id number] Francisca Harbuck IMPRESSIONS  1. Left ventricular ejection fraction, by estimation, is 60 to 65%. The left ventricle has normal function. The left ventricle has no regional wall motion abnormalities. There is mild left ventricular hypertrophy. Left ventricular diastolic parameters are consistent with Grade I diastolic dysfunction (impaired relaxation).  2. Right ventricular systolic function is normal. The right ventricular size is normal.  3. Left atrial size was mild to moderately dilated.  4. The mitral valve is normal in structure. Mild mitral valve regurgitation. No evidence of mitral stenosis.  5. The aortic valve has an indeterminant number of cusps. Aortic valve regurgitation is not visualized. No aortic stenosis is present.  6. Aortic dilatation noted. There is mild dilatation of the aortic root, measuring 41 mm.  7. The inferior vena cava is normal in size with greater than 50% respiratory variability, suggesting right atrial pressure of 3 mmHg. FINDINGS  Left Ventricle: Left ventricular ejection fraction, by estimation, is 60 to 65%. The left ventricle has normal function. The left ventricle has no regional wall motion abnormalities. The left ventricular internal cavity size was normal in size. There is   mild left ventricular hypertrophy. Left ventricular diastolic parameters are consistent with Grade I diastolic dysfunction (impaired relaxation). Normal left ventricular filling pressure. Right Ventricle: The right ventricular size is normal. No increase in right ventricular wall thickness. Right ventricular systolic function is normal. Left Atrium: Left atrial size was mild to moderately dilated. Right Atrium: Right atrial size was normal in size. Pericardium: There is no evidence of pericardial effusion. Mitral Valve: The mitral valve is normal in structure. Mild mitral valve regurgitation. No evidence of mitral valve stenosis. Tricuspid Valve: The tricuspid valve is normal in structure. Tricuspid valve regurgitation is not demonstrated. No evidence of tricuspid stenosis. Aortic Valve: The aortic valve has an indeterminant number of cusps. Aortic valve regurgitation is not visualized. No aortic stenosis is present. Aortic valve mean gradient measures 2.4 mmHg. Aortic valve peak gradient measures 4.9 mmHg. Aortic valve area, by VTI measures 2.77 cm. Pulmonic Valve: The pulmonic valve was not well visualized. Pulmonic valve regurgitation is not visualized. No evidence of pulmonic stenosis. Aorta: Aortic dilatation noted. There is mild dilatation of the aortic root, measuring 41 mm. Venous: The inferior vena cava is normal in size with greater than 50% respiratory variability, suggesting right atrial pressure of 3 mmHg. IAS/Shunts: No atrial level shunt detected by color flow Doppler.  LEFT VENTRICLE PLAX 2D LVIDd:         5.07 cm  Diastology LVIDs:         3.14 cm  LV e' medial:    7.62 cm/s LV PW:         1.28 cm  LV E/e' medial:  8.7 LV IVS:        1.13 cm  LV e' lateral:   9.68 cm/s LVOT diam:     2.00 cm  LV E/e' lateral: 6.9 LV SV:         67 LV SV Index:   30 LVOT Area:     3.14 cm  RIGHT VENTRICLE RV S prime:     12.00  cm/s TAPSE (M-mode): 2.1 cm LEFT ATRIUM             Index       RIGHT ATRIUM            Index LA diam:        3.60 cm 1.62 cm/m  RA Area:     15.70 cm LA Vol (A2C):   86.4 ml 38.96 ml/m RA Volume:   43.80 ml  19.75 ml/m LA Vol (A4C):   56.0 ml 25.25 ml/m LA Biplane Vol: 75.6 ml 34.09 ml/m  AORTIC VALVE AV Area (Vmax):    2.62 cm AV Area (Vmean):   2.70 cm AV Area (VTI):     2.77 cm AV Vmax:           110.33 cm/s AV Vmean:          71.827 cm/s AV VTI:            0.240 m AV Peak Grad:      4.9 mmHg AV Mean Grad:      2.4 mmHg LVOT Vmax:         91.90 cm/s LVOT Vmean:        61.700 cm/s LVOT VTI:          0.212 m LVOT/AV VTI ratio: 0.88  AORTA Ao Root diam: 4.10 cm MITRAL VALVE MV Area (PHT): 4.41 cm    SHUNTS MV Decel Time: 172 msec    Systemic VTI:  0.21 m MV E velocity: 66.50 cm/s  Systemic Diam: 2.00 cm MV A velocity: 78.80 cm/s MV E/A ratio:  0.84 Carlyle Dolly MD Electronically signed by Carlyle Dolly MD Signature Date/Time: 11/09/2019/12:25:30 PM    Final      CBC Recent Labs  Lab 11/08/19 1807 11/09/19 0424  WBC 6.5 6.2  HGB 13.5 12.5*  HCT 40.2 38.4*  PLT 210 180  MCV 92.2 95.0  MCH 31.0 30.9  MCHC 33.6 32.6  RDW 12.9 13.1  LYMPHSABS 1.1  --   MONOABS 0.4  --   EOSABS 0.0  --   BASOSABS 0.0  --     Chemistries  Recent Labs  Lab 11/08/19 1807 11/08/19 2056 11/09/19 0424  NA 133*  --  135  K 2.8*  --  3.2*  CL 101  --  103  CO2 25  --  24  GLUCOSE 175*  --  138*  BUN 15  --  17  CREATININE 1.15  --  0.94  CALCIUM 8.2*  --  8.0*  MG  --  1.8 1.8  AST 17  --  14*  ALT 15  --  13  ALKPHOS 51  --  46  BILITOT 0.6  --  0.8   ------------------------------------------------------------------------------------------------------------------ No results for input(s): CHOL, HDL, LDLCALC, TRIG, CHOLHDL, LDLDIRECT in the last 72 hours.  Lab Results  Component Value Date   HGBA1C 5.9 (A) 08/30/2019   HGBA1C 5.9 08/30/2019   HGBA1C 5.9 08/30/2019   HGBA1C 5.9 08/30/2019    ------------------------------------------------------------------------------------------------------------------ No results for input(s): TSH, T4TOTAL, T3FREE, THYROIDAB in the last 72 hours.  Invalid input(s): FREET3 ------------------------------------------------------------------------------------------------------------------ No results for input(s): VITAMINB12, FOLATE, FERRITIN, TIBC, IRON, RETICCTPCT in the last 72 hours.  Coagulation profile Recent Labs  Lab 11/09/19 0424  INR 1.2    No results for input(s): DDIMER in the last 72 hours.  Cardiac Enzymes No results for input(s): CKMB, TROPONINI, MYOGLOBIN in the last 168 hours.  Invalid input(s): CK ------------------------------------------------------------------------------------------------------------------ No results found  for: BNP   Roxan Hockey M.D on 11/09/2019 at 4:59 PM  Go to [email or web address] - for contact info  [facility] - Office  [phone]

## 2019-11-09 NOTE — ED Notes (Signed)
Pt provided with diet ginger ale and repositioned in bed for comfort. No other requests at this time. Call bell within reach, bed in low position. Will continue to monitor.

## 2019-11-10 ENCOUNTER — Telehealth: Payer: Self-pay | Admitting: *Deleted

## 2019-11-10 DIAGNOSIS — R42 Dizziness and giddiness: Secondary | ICD-10-CM | POA: Diagnosis not present

## 2019-11-10 DIAGNOSIS — R001 Bradycardia, unspecified: Secondary | ICD-10-CM

## 2019-11-10 LAB — CBC
HCT: 41.7 % (ref 39.0–52.0)
Hemoglobin: 13.4 g/dL (ref 13.0–17.0)
MCH: 30.2 pg (ref 26.0–34.0)
MCHC: 32.1 g/dL (ref 30.0–36.0)
MCV: 93.9 fL (ref 80.0–100.0)
Platelets: 191 10*3/uL (ref 150–400)
RBC: 4.44 MIL/uL (ref 4.22–5.81)
RDW: 13 % (ref 11.5–15.5)
WBC: 4.8 10*3/uL (ref 4.0–10.5)
nRBC: 0 % (ref 0.0–0.2)

## 2019-11-10 LAB — CORTISOL: Cortisol, Plasma: 7.3 ug/dL

## 2019-11-10 LAB — BASIC METABOLIC PANEL
Anion gap: 7 (ref 5–15)
BUN: 11 mg/dL (ref 8–23)
CO2: 25 mmol/L (ref 22–32)
Calcium: 8.6 mg/dL — ABNORMAL LOW (ref 8.9–10.3)
Chloride: 106 mmol/L (ref 98–111)
Creatinine, Ser: 0.98 mg/dL (ref 0.61–1.24)
GFR, Estimated: >60 mL/min (ref >60)
Glucose, Bld: 108 mg/dL — ABNORMAL HIGH (ref 70–99)
Potassium: 3.8 mmol/L (ref 3.5–5.1)
Sodium: 138 mmol/L (ref 135–145)

## 2019-11-10 MED ORDER — AMLODIPINE BESYLATE 10 MG PO TABS: 5.0000 mg | ORAL_TABLET | Freq: Every day | ORAL | 1 refills | Status: DC

## 2019-11-10 MED ORDER — CEPHALEXIN 500 MG PO CAPS
500.0000 mg | ORAL_CAPSULE | Freq: Three times a day (TID) | ORAL | 0 refills | Status: DC
Start: 2019-11-11 — End: 2019-11-10

## 2019-11-10 MED ORDER — SODIUM CHLORIDE 0.9 % IV SOLN
1.0000 g | Freq: Once | INTRAVENOUS | Status: AC
  Administered 2019-11-10: 1 g via INTRAVENOUS
  Filled 2019-11-10: qty 10

## 2019-11-10 MED ORDER — NICOTINE 14 MG/24HR TD PT24: 14.0000 mg | MEDICATED_PATCH | Freq: Every day | TRANSDERMAL | 3 refills | Status: DC

## 2019-11-10 MED ORDER — CEPHALEXIN 500 MG PO CAPS: 500.0000 mg | ORAL_CAPSULE | Freq: Three times a day (TID) | ORAL | 0 refills | Status: AC

## 2019-11-10 NOTE — Progress Notes (Addendum)
Telemetry shows no recurrent arrhythmias. We will plan for a 2 week zio patch prior to discharge. Echo was benign. Severely orthostatic by EMS evaluation likely playing a big part in his dizziness. From notes was orthostatic again by repeat vitals yesterday afternoon and restarted on IVFs. Repeat orthostatics today, if asymptomatic (even if orthostatics remain abnormal) would just treat conservatively with aggressive oral hydration at home and compression stockings, at f/u if ongoing symptoms would need to consider florinef or midodrine. Continue norvasc for now. If remains symptomatic in hospital would lower his norvasc to 5mg  daily and start florinef 0.1mg  daily.   Transient tachyarrhythmia in ED unclear if may have played a role, f/u monitor results. No further inpatient testing is planned, we will sign off inpatient care and arrange f/u   Carlyle Dolly MD

## 2019-11-10 NOTE — Discharge Instructions (Signed)
1)You have E. coli urinary Tract infection--- please take cephalexin/Keflex antibiotic as prescribed, please call [phone] on Saturday afternoon 11/11/19--- Please ask to speak with Dr. Roxan Hockey to get a final report of urine culture to make sure the antibiotic you are on now is the appropriate one-  2)Please follow-up with your cardiologist as advised to analyze the results of your heart monitor  3)Your Primary care physician will check a Cortisol level as low Cortisol level can make you have low blood pressure and make you dizzy  4)Please decrease amlodipine to 5 mg daily from 10 mg daily to avoid low blood pressure and dizziness

## 2019-11-10 NOTE — Telephone Encounter (Addendum)
Pt enrolled in [place] for 14 day monitor for bradycardia per Dr. Harl Bowie. Monitor placed. Pt voiced understanding on how to return monitor.

## 2019-11-10 NOTE — Care Management Important Message (Signed)
Important Message  Patient Details  Name: Matthew Adkins MRN: [id number] Date of Birth: October 01, 1950   Medicare Important Message Given:  Yes     Tommy Medal 11/10/2019, 1:26 PM

## 2019-11-10 NOTE — Plan of Care (Signed)

## 2019-11-10 NOTE — Discharge Summary (Signed)
Matthew Adkins, is a 69 y.o. male  DOB 1951/01/05  MRN [id number].  Admission date:  11/08/2019  Admitting Physician  Roxan Hockey, MD  Discharge Date:  11/10/2019   Primary MD  Fayrene Helper, MD  Recommendations for primary care physician for things to follow:   1)You have E. coli urinary tract infection--- please take cephalexin/Keflex antibiotic as prescribed, please call [phone] on Saturday afternoon 11/11/19--- Please ask to speak with Dr. Roxan Hockey to get a final report of urine culture to make sure the antibiotic you are on now is the appropriate one-  2)Please follow-up with your cardiologist as advised to analyze the results of your heart monitor  3)Your Primary care physician will check a Cortisol level as low Cortisol level can make you have low blood pressure and make you dizzy  4)Please decrease amlodipine to 5 mg daily from 10 mg daily to avoid low blood pressure and dizziness  Admission Diagnosis  Syncope and collapse [R55] Dizziness [R42] Chest pain [R07.9] Orthostatic dizziness [R42]   Discharge Diagnosis  Syncope and collapse [R55] Dizziness [R42] Chest pain [R07.9] Orthostatic dizziness [R42]    Principal Problem:   Dizziness Active Problems:   Prediabetes   NICOTINE ADDICTION   Arthritis of knee, degenerative   Degenerative disc disease, lumbar   GERD (gastroesophageal reflux disease)   Essential hypertension   Hypokalemia   Orthostatic dizziness      Past Medical History:  Diagnosis Date  . Bladder cancer ([place]) 2000  . Depression   . Essential hypertension 01/16/2018  . Nicotine addiction   . Prostate cancer ([place]) 07/06/2014  . Seasonal allergies     Past Surgical History:  Procedure Laterality Date  . athroscopy of right knee  2002  . cancerous polyps removed from bladder  2000  . COLONOSCOPY  MJ 2009 pmhX: POLYPS   POLYP REMOVED BUT NOT  RETRIEVED  . COLONOSCOPY  LS 2007 ARS   ? POLYPS, no path available  . COLONOSCOPY  09/26/2010   sessile poylp(2) pan-colonic diverticulosis,mild/internal hemorrhoids  . COLONOSCOPY WITH PROPOFOL N/A 02/09/2017   Procedure: COLONOSCOPY WITH PROPOFOL;  Surgeon: Danie Binder, MD;  [facility];  Service: Endoscopy;  Laterality: N/A;  8:30am  . ESOPHAGOGASTRODUODENOSCOPY (EGD) WITH PROPOFOL N/A 02/09/2017   Procedure: ESOPHAGOGASTRODUODENOSCOPY (EGD) WITH PROPOFOL;  Surgeon: Danie Binder, MD;  [facility];  Service: Endoscopy;  Laterality: N/A;  . FOOT SURGERY Bilateral 2004  . HERNIA REPAIR  1980's   left inguinal hernia  . LYMPHADENECTOMY Bilateral 09/03/2014   Procedure: PELVIC LYMPHADENECTOMY;  Surgeon: Raynelle Bring, MD;  Location: [facility];  Service: Urology;  Laterality: Bilateral;  . POLYPECTOMY  02/09/2017   Procedure: POLYPECTOMY;  Surgeon: Danie Binder, MD;  [facility];  Service: Endoscopy;;  cecal polyp, hepatic flexure polyps x3, transverse colon polyp, descending colon polyps x2, rectal polyps x2  . PROSTATE BIOPSY  07/06/14  . ROBOT ASSISTED LAPAROSCOPIC RADICAL PROSTATECTOMY N/A 09/03/2014   Procedure: ROBOTIC ASSISTED LAPAROSCOPIC  RADICAL PROSTATECTOMY LEVEL 2;  Surgeon: Raynelle Bring, MD;  Location: [facility];  Service: Urology;  Laterality: N/A;     HPI  from the history and physical done on the day of admission:   Chief Complaint: Dizziness   HPI: Matthew Adkins is a 69 y.o. male with medical history significant for hypertension, hyperlipidemia, osteoarthritis, DDD,  GERD, prediabetes, tobacco use and history of bladder cancer (diagnosed on TURBT in 2000 by Dr. Maryland Pink) in remission and history of prostate cancer s/p surgical therapy (robotic assisted laparoscopic radical prostatectomy level 2) with a unilateral left nerve sparing robot-assisted laparoscopic radical prostatectomy and pelvic lymphadenectomy (August 2016) who presents to the  emergency department due to dizziness which started this afternoon.  Patient states that dizziness worsens with standing and walking, this was not associated with head movement.  He denies chest pain, shortness of breath, headache, fever, chills, ringing in the ears or any recent upper respiratory infection.  ED Course:  In the emergency department, he was hemodynamically stable, orthostatic vital signs was negative.  However the ED physician states that HR was in the 120s at bedside and that this quickly decreased to 60s within few seconds.  Work-up in the ED showed normal CBC, mild hyponatremia, hypokalemia, troponin x1 is negative, magnesium was 1.8.  Chest x-ray showed no active disease.  CT of head without contrast showed no acute retinal abnormality.  IV hydration was provided, potassium was replenished.  Hospitalist was asked to admit patient for further evaluation and management.      Hospital Course:     Brief Summary:- 69 y.o.malewith medical history significant forhypertension, hyperlipidemia,osteoarthritis,DDD,GERD, prediabetes, tobacco use and history of bladder cancer (diagnosed on TURBT in 2000by Dr. [place] Pink) in remission and history of prostate cancer s/psurgical therapy(robotic assisted laparoscopic radical prostatectomy level 2) with a unilateral left nerve sparing robot-assisted laparoscopic radical prostatectomy and pelvic lymphadenectomy(August 2016) admitted on 11/08/2019 with dizziness and found to have orthostatic hypotension as well as telemetry/EKG findings suggestive of atrial tachycardia Vs 2:1 atrial flutter   A/p 1)Dizziness/Intermittent Tachycardia -- ??? 2:1 Atrial Flutter Vs atrial tachycardia, Cardiology consult appreciated, echo noted --Orthostatic dizziness improving -Per EMS runs sheet-- on 03/0/09 Systolic blood pressure dropped 44 points from sitting to standing and diastolic blood pressure dropped 13 points, patient was dizzy/symptomatic with these  orthostatic drop in blood pressure -Repeat orthostatic vitals in the ED on 11/08/2019 at 9:53 PM revealed that patient was still orthostatic by heart rate but not by BP -Patient heart rate went from 70 to 130 within 3 min of standing--please note that this repeat orthostatic vitals were done after 1 L of IV normal saline was given -Carotid Artery Dopplers without hemodynamically significant stenosis -Avoid AV nodal blocking agents at this time due to resting bradycardia -Pt enrolled in [employer] for 14 day monitor for bradycardia per Dr. Harl Bowie -Random cortisol is borderline at 7.3 if orthostatic dizziness persist patient is to follow-up with endocrinology for further work-up regarding possible adrenal insufficiency  2)HFpEF--Echo with preserved EF of 60 to 65% with grade 1 diastolic dysfunction -Post hydration appears euvolemic  3)HypoKalemia--potassium was 2.8 up to 3.2 after replacement additional replacements administered -Magnesium WNL  4) glucose intolerance--A1c 5.9,   5) tobacco abuse---  smoking cessation advised nicotine patch is ordered  6)HTN--orthostatic hypotension improving, decrease amlodipine to 5 mg daily from 10 mg   7) acute anemia--most likely hemodilution, hemoglobin down to 12.5 from 13.5 on admission, no bleeding concerns  8)  E. coli UTI---  treated with IV Rocephin , discharged on p.o. Keflex  Disposition-discharge home, with [employer] for 14 day monitor for bradycardia per Dr. Harl Bowie  Disposition: The patient is from: Home  Anticipated d/c is to: Home  Code Status : full code  Family Communication:   (patient is alert, awake and coherent)    Consults  : cardiology Pt enrolled in [place] for 14 day monitor for bradycardia per Dr. Harl Bowie  Discharge Condition: Stable  Follow UP   Follow-up Information    Imogene Burn, PA-C Follow up on 12/05/2019.   Specialty: Cardiology Why: Cardiology Hospital Follow-up on  12/05/2019 at 12:15. Please call the office if needing a different date or time.  Contact information: [place] [address] [phone]        Fayrene Helper, MD. Schedule an appointment as soon as possible for a visit in 4 day(s).   Specialty: Family Medicine Why: Needs am Cortisol check  Contact information: [address] [place] [address] [phone]               Consults obtained -  Diet and Activity recommendation:  As advised  Discharge Instructions     Discharge Instructions    Call MD for:  difficulty breathing, headache or visual disturbances   Complete by: As directed    Call MD for:  persistant dizziness or light-headedness   Complete by: As directed    Call MD for:  persistant nausea and vomiting   Complete by: As directed    Call MD for:  severe uncontrolled pain   Complete by: As directed    Call MD for:  temperature >100.4   Complete by: As directed    Diet - low sodium heart healthy   Complete by: As directed    Discharge instructions   Complete by: As directed    1)You have E. coli urinary tract infection--- please take cephalexin/Keflex antibiotic as prescribed, please call [phone] on Saturday afternoon 11/11/19--- Please ask to speak with Dr. Roxan Hockey to get a final report of urine culture to make sure the antibiotic you are on now is the appropriate one-  2)Please follow-up with your cardiologist as advised to analyze the results of your heart monitor  3)Your Primary care physician will check a Cortisol level as low Cortisol level can make you have low blood pressure and make you dizzy  4)Please decrease amlodipine to 5 mg daily from 10 mg daily to avoid low blood pressure and dizziness   Increase activity slowly   Complete by: As directed         Discharge Medications     Allergies as of 11/10/2019      Reactions   Bee Venom    Codeine Nausea And Vomiting      Medication List     STOP taking these medications   dextromethorphan 30 MG/5ML liquid Commonly known as: DELSYM   HYDROcodone-acetaminophen 5-325 MG tablet Commonly known as: NORCO/VICODIN     TAKE these medications   acetaminophen 500 MG tablet Commonly known as: TYLENOL Take 1,000 mg by mouth every 6 (six) hours as needed for mild pain.   amLODipine 10 MG tablet Commonly known as: NORVASC Take 0.5 tablets (5 mg total) by mouth daily. What changed: how much to take   buPROPion 150 MG 12 hr tablet Commonly known as: Wellbutrin SR Take 1 tablet (150 mg total) by mouth 2 (two) times daily.   cephALEXin  500 MG capsule Commonly known as: Keflex Take 1 capsule (500 mg total) by mouth 3 (three) times daily for 5 days. Start on Saturday 11/11/19 Start taking on: November 11, 2019   cyclobenzaprine 10 MG tablet Commonly known as: FLEXERIL Take 1 tablet (10 mg total) by mouth at bedtime.   diazepam 2 MG tablet Commonly known as: VALIUM Take 1 tablet (2 mg total) by mouth every 6 (six) hours as needed for muscle spasms.   FLUoxetine 20 MG capsule Commonly known as: PROZAC TAKE 1 CAPSULE BY MOUTH DAILY.   nicotine 14 mg/24hr patch Commonly known as: NICODERM CQ - dosed in mg/24 hours Place 1 patch (14 mg total) onto the skin daily.   omeprazole 20 MG capsule Commonly known as: PRILOSEC TAKE 1 CAPSULE BY MOUTH 30 MINUTES PRIOR TO BREAKFAST. What changed: See the new instructions.   ONE DAILY MENS 50+ MULTIVIT PO Take 1 tablet by mouth daily.       Major procedures and Radiology Reports - PLEASE review detailed and final reports for all details, in brief -    DG Chest 1 View  Result Date: 11/08/2019 CLINICAL DATA:  Chest pain with shortness of breath and weakness. EXAM: CHEST  1 VIEW.  The patient is rotated. COMPARISON:  Chest x-ray 02/03/2015, CT chest 06/14/2017 FINDINGS: The heart size and mediastinal contours are within normal limits. No focal consolidation. No pulmonary edema. No  pleural effusion. No pneumothorax. No acute osseous abnormality. IMPRESSION: No active disease. Electronically Signed   By: Iven Finn M.D.   On: 11/08/2019 18:43   CT Head Wo Contrast  Result Date: 11/08/2019 CLINICAL DATA:  Dizziness, cerebral hemorrhage suspected. EXAM: CT HEAD WITHOUT CONTRAST TECHNIQUE: Contiguous axial images were obtained from the base of the skull through the vertex without intravenous contrast. COMPARISON:  CT head 06/23/2019 FINDINGS: Brain: No evidence of large-territorial acute infarction. No parenchymal hemorrhage. No mass lesion. No extra-axial collection. No mass effect or midline shift. No hydrocephalus. Basilar cisterns are patent. Vascular: No hyperdense vessel. Atherosclerotic calcifications are present within the cavernous internal carotid arteries. Skull: No acute fracture or focal lesion. Sinuses/Orbits: Paranasal sinuses and mastoid air cells are clear. The orbits are unremarkable. Other: None. IMPRESSION: No acute intracranial abnormality. Electronically Signed   By: Iven Finn M.D.   On: 11/08/2019 18:42   US Carotid Bilateral  Result Date: 11/10/2019 CLINICAL DATA:  69 year old male with a history syncope EXAM: BILATERAL CAROTID DUPLEX ULTRASOUND TECHNIQUE: Pearline Cables scale imaging, color Doppler and duplex ultrasound were performed of bilateral carotid and vertebral arteries in the neck. COMPARISON:  None. FINDINGS: Criteria: Quantification of carotid stenosis is based on velocity parameters that correlate the residual internal carotid diameter with NASCET-based stenosis levels, using the diameter of the distal internal carotid lumen as the denominator for stenosis measurement. The following velocity measurements were obtained: RIGHT ICA:  Systolic 82 cm/sec, Diastolic 25 cm/sec CCA:  48 cm/sec SYSTOLIC ICA/CCA RATIO:  1.7 ECA:  53 cm/sec LEFT ICA:  Systolic 109 cm/sec, Diastolic 34 cm/sec CCA:  82 cm/sec SYSTOLIC ICA/CCA RATIO:  1.9 ECA:  56 cm/sec Right  Brachial SBP: Not acquired Left Brachial SBP: Not acquired RIGHT CAROTID ARTERY: No significant calcifications of the right common carotid artery. Intermediate waveform maintained. Heterogeneous and partially calcified plaque at the right carotid bifurcation. No significant lumen shadowing. Low resistance waveform of the right ICA. No significant tortuosity. RIGHT VERTEBRAL ARTERY: Antegrade flow with low resistance waveform. LEFT CAROTID ARTERY: No significant calcifications of the left  common carotid artery. Intermediate waveform maintained. Heterogeneous and partially calcified plaque at the left carotid bifurcation without significant lumen shadowing. Low resistance waveform of the left ICA. No significant tortuosity. LEFT VERTEBRAL ARTERY:  Antegrade flow with low resistance waveform. IMPRESSION: Right: Color duplex indicates minimal heterogeneous and calcified plaque, with no hemodynamically significant stenosis by duplex criteria in the extracranial cerebrovascular circulation. Left: Heterogeneous and partially calcified plaque at the left carotid bifurcation, with discordant results regarding degree of stenosis by established duplex criteria. Peak velocity suggests 50%-69% stenosis, with the ICA/ CCA ratio suggesting a lesser degree of stenosis. If establishing a more accurate degree of stenosis is required, cerebral angiogram should be considered, or as a second best test, CTA. Signed, Dulcy Fanny. Dellia Nims, RPVI [facility] Radiology Electronically Signed   By: Corrie Mckusick D.O.   On: 11/10/2019 11:32   ECHOCARDIOGRAM COMPLETE  Result Date: 11/09/2019    ECHOCARDIOGRAM REPORT   Patient Name:   Matthew Adkins Date of Exam: 11/09/2019 Medical Rec #:  [id number]        Height:       75.0 in Accession #:    [id number]       Weight:       205.0 lb Date of Birth:  1950/01/12        BSA:          2.218 m Patient Age:    50 years         BP:           140/68 mmHg  Patient Gender: M                HR:           52 bpm. Exam Location:  [facility] Procedure: 2D Echo, Cardiac Doppler and Color Doppler Indications:    Abnormal ECG 794.31 / R94.31  History:        Patient has no prior history of Echocardiogram examinations.                 Risk Factors:Hypertension, Prediabetes, Dyslipidemia and Current                 Smoker. H/O prostate cancer.  Sonographer:    Alvino Chapel RCS Referring Phys: [phone] Ayliana Casciano IMPRESSIONS  1. Left ventricular ejection fraction, by estimation, is 60 to 65%. The left ventricle has normal function. The left ventricle has no regional wall motion abnormalities. There is mild left ventricular hypertrophy. Left ventricular diastolic parameters are consistent with Grade I diastolic dysfunction (impaired relaxation).  2. Right ventricular systolic function is normal. The right ventricular size is normal.  3. Left atrial size was mild to moderately dilated.  4. The mitral valve is normal in structure. Mild mitral valve regurgitation. No evidence of mitral stenosis.  5. The aortic valve has an indeterminant number of cusps. Aortic valve regurgitation is not visualized. No aortic stenosis is present.  6. Aortic dilatation noted. There is mild dilatation of the aortic root, measuring 41 mm.  7. The inferior vena cava is normal in size with greater than 50% respiratory variability, suggesting right atrial pressure of 3 mmHg. FINDINGS  Left Ventricle: Left ventricular ejection fraction, by estimation, is 60 to 65%. The left ventricle has normal function. The left ventricle has no regional wall motion abnormalities. The left ventricular internal cavity size was normal in size. There is  mild left ventricular hypertrophy. Left ventricular diastolic parameters are consistent with Grade  I diastolic dysfunction (impaired relaxation). Normal left ventricular filling pressure. Right Ventricle: The right ventricular size is normal. No increase in right  ventricular wall thickness. Right ventricular systolic function is normal. Left Atrium: Left atrial size was mild to moderately dilated. Right Atrium: Right atrial size was normal in size. Pericardium: There is no evidence of pericardial effusion. Mitral Valve: The mitral valve is normal in structure. Mild mitral valve regurgitation. No evidence of mitral valve stenosis. Tricuspid Valve: The tricuspid valve is normal in structure. Tricuspid valve regurgitation is not demonstrated. No evidence of tricuspid stenosis. Aortic Valve: The aortic valve has an indeterminant number of cusps. Aortic valve regurgitation is not visualized. No aortic stenosis is present. Aortic valve mean gradient measures 2.4 mmHg. Aortic valve peak gradient measures 4.9 mmHg. Aortic valve area, by VTI measures 2.77 cm. Pulmonic Valve: The pulmonic valve was not well visualized. Pulmonic valve regurgitation is not visualized. No evidence of pulmonic stenosis. Aorta: Aortic dilatation noted. There is mild dilatation of the aortic root, measuring 41 mm. Venous: The inferior vena cava is normal in size with greater than 50% respiratory variability, suggesting right atrial pressure of 3 mmHg. IAS/Shunts: No atrial level shunt detected by color flow Doppler.  LEFT VENTRICLE PLAX 2D LVIDd:         5.07 cm  Diastology LVIDs:         3.14 cm  LV e' medial:    7.62 cm/s LV PW:         1.28 cm  LV E/e' medial:  8.7 LV IVS:        1.13 cm  LV e' lateral:   9.68 cm/s LVOT diam:     2.00 cm  LV E/e' lateral: 6.9 LV SV:         67 LV SV Index:   30 LVOT Area:     3.14 cm  RIGHT VENTRICLE RV S prime:     12.00 cm/s TAPSE (M-mode): 2.1 cm LEFT ATRIUM             Index       RIGHT ATRIUM           Index LA diam:        3.60 cm 1.62 cm/m  RA Area:     15.70 cm LA Vol (A2C):   86.4 ml 38.96 ml/m RA Volume:   43.80 ml  19.75 ml/m LA Vol (A4C):   56.0 ml 25.25 ml/m LA Biplane Vol: 75.6 ml 34.09 ml/m  AORTIC VALVE AV Area (Vmax):    2.62 cm AV Area (Vmean):    2.70 cm AV Area (VTI):     2.77 cm AV Vmax:           110.33 cm/s AV Vmean:          71.827 cm/s AV VTI:            0.240 m AV Peak Grad:      4.9 mmHg AV Mean Grad:      2.4 mmHg LVOT Vmax:         91.90 cm/s LVOT Vmean:        61.700 cm/s LVOT VTI:          0.212 m LVOT/AV VTI ratio: 0.88  AORTA Ao Root diam: 4.10 cm MITRAL VALVE MV Area (PHT): 4.41 cm    SHUNTS MV Decel Time: 172 msec    Systemic VTI:  0.21 m MV E velocity: 66.50 cm/s  Systemic Diam: 2.00 cm MV  A velocity: 78.80 cm/s MV E/A ratio:  0.84 Carlyle Dolly MD Electronically signed by Carlyle Dolly MD Signature Date/Time: 11/09/2019/12:25:30 PM    Final     Micro Results   Recent Results (from the past 240 hour(s))  Respiratory Panel by RT PCR (Flu A&B, Covid) - Nasopharyngeal Swab     Status: None   Collection Time: 11/08/19  6:35 PM   Specimen: Nasopharyngeal Swab  Result Value Ref Range Status   SARS Coronavirus 2 by RT PCR NEGATIVE NEGATIVE Final    Comment: (NOTE) SARS-CoV-2 target nucleic acids are NOT DETECTED.  The SARS-CoV-2 RNA is generally detectable in upper respiratoy specimens during the acute phase of infection. The lowest concentration of SARS-CoV-2 viral copies this assay can detect is 131 copies/mL. A negative result does not preclude SARS-Cov-2 infection and should not be used as the sole basis for treatment or other patient management decisions. A negative result may occur with  improper specimen collection/handling, submission of specimen other than nasopharyngeal swab, presence of viral mutation(s) within the areas targeted by this assay, and inadequate number of viral copies (<131 copies/mL). A negative result must be combined with clinical observations, patient history, and epidemiological information. The expected result is Negative.  Fact Sheet for Patients:  PinkCheek.be  Fact Sheet for Healthcare Providers:  GravelBags.it  This  test is no t yet approved or cleared by the [place] FDA and  has been authorized for detection and/or diagnosis of SARS-CoV-2 by FDA under an Emergency Use Authorization (EUA). This EUA will remain  in effect (meaning this test can be used) for the duration of the COVID-19 declaration under Section 564(b)(1) of the Act, 21 U.S.C. section 360bbb-3(b)(1), unless the authorization is terminated or revoked sooner.     Influenza A by PCR NEGATIVE NEGATIVE Final   Influenza B by PCR NEGATIVE NEGATIVE Final    Comment: (NOTE) The Xpert Xpress SARS-CoV-2/FLU/RSV assay is intended as an aid in  the diagnosis of influenza from Nasopharyngeal swab specimens and  should not be used as a sole basis for treatment. Nasal washings and  aspirates are unacceptable for Xpert Xpress SARS-CoV-2/FLU/RSV  testing.  Fact Sheet for Patients: PinkCheek.be  Fact Sheet for Healthcare Providers: GravelBags.it  This test is not yet approved or cleared by the [place] FDA and  has been authorized for detection and/or diagnosis of SARS-CoV-2 by  FDA under an Emergency Use Authorization (EUA). This EUA will remain  in effect (meaning this test can be used) for the duration of the  Covid-19 declaration under Section 564(b)(1) of the Act, 21  U.S.C. section 360bbb-3(b)(1), unless the authorization is  terminated or revoked. Performed at [facility], [address] [place], [place] [address]   Urine Culture     Status: Abnormal (Preliminary result)   Collection Time: 11/09/19  2:55 AM   Specimen: Urine, Clean Catch  Result Value Ref Range Status   Specimen Description   Final    URINE, CLEAN CATCH Performed at [facility], [address], [place], [place] [address]    Special Requests   Final    Normal Performed at [facility], [address], [place], [place] [address]    Culture (A)  Final    >=100,000 COLONIES/mL ESCHERICHIA  COLI SUSCEPTIBILITIES TO FOLLOW Performed at [place] [address], [place], [place] [address]    Report Status PENDING  Incomplete       Today   Subjective    Matthew Adkins  today has no new complaints  -Orthostatic dizziness has not resolved , but it has improved -No fevers no chills, no chest pains no palpitations no shortness of breath          Patient has been seen and examined prior to discharge   Objective   Blood pressure 121/89, pulse 77, temperature 98.3 F (36.8 C), resp. rate 20, height 6\' 3"  (1.905 m), weight 93 kg, SpO2 97 %.   Intake/Output Summary (Last 24 hours) at 11/10/2019 1445 Last data filed at 11/10/2019 0900 Gross per 24 hour  Intake 1149.26 ml  Output --  Net 1149.26 ml    Exam Gen:- Awake Alert, no acute distress  HEENT:- [place].AT, No sclera icterus Neck-Supple Neck,No JVD,.  Lungs-  CTAB , good air movement bilaterally  CV- S1, S2 normal, regular Abd-  +ve B.Sounds, Abd Soft, No tenderness,    Extremity/Skin:- No  edema,   good pulses Psych-affect is appropriate, oriented x3 Neuro-no new focal deficits, no tremors    Data Review   CBC w Diff:  Lab Results  Component Value Date   WBC 4.8 11/10/2019   HGB 13.4 11/10/2019   HCT 41.7 11/10/2019   PLT 191 11/10/2019   LYMPHOPCT 18 11/08/2019   MONOPCT 7 11/08/2019   EOSPCT 1 11/08/2019   BASOPCT 1 11/08/2019    CMP:  Lab Results  Component Value Date   NA 138 11/10/2019   NA 141 08/18/2019   K 3.8 11/10/2019   CL 106 11/10/2019   CO2 25 11/10/2019   BUN 11 11/10/2019   BUN 11 08/18/2019   CREATININE 0.98 11/10/2019   CREATININE 0.92 05/29/2015   PROT 6.0 (L) 11/09/2019   PROT 6.6 08/18/2019   ALBUMIN 3.2 (L) 11/09/2019   ALBUMIN 4.0 08/18/2019   BILITOT 0.8 11/09/2019   BILITOT 0.6 08/18/2019   ALKPHOS 46 11/09/2019   AST 14 (L) 11/09/2019   ALT 13 11/09/2019  .   Total Discharge time is about 33 minutes  Roxan Hockey M.D on 11/10/2019 at 2:45  PM  Go to [email or web address] -  for contact info  [facility] - Office  [phone]

## 2019-11-11 LAB — URINE CULTURE
Culture: >=100000 — AB
Special Requests: NORMAL

## 2019-11-14 ENCOUNTER — Telehealth: Payer: Self-pay

## 2019-11-14 NOTE — Telephone Encounter (Signed)
Transition Care Management Unsuccessful Follow-up Telephone Call  Date of discharge and from where: 11/10/2019 [facility]  Attempts:  1st Attempt  Reason for unsuccessful TCM follow-up call:  Unable to reach patient

## 2019-11-22 ENCOUNTER — Encounter: Payer: Self-pay | Admitting: Internal Medicine

## 2019-11-22 ENCOUNTER — Inpatient Hospital Stay: Payer: Medicare PPO | Admitting: Family Medicine

## 2019-11-22 ENCOUNTER — Encounter (INDEPENDENT_AMBULATORY_CARE_PROVIDER_SITE_OTHER): Payer: Medicare PPO | Admitting: Ophthalmology

## 2019-11-22 ENCOUNTER — Other Ambulatory Visit: Payer: Self-pay

## 2019-11-22 ENCOUNTER — Ambulatory Visit (INDEPENDENT_AMBULATORY_CARE_PROVIDER_SITE_OTHER): Payer: Medicare PPO | Admitting: Internal Medicine

## 2019-11-22 VITALS — BP 130/75 | HR 67 | Temp 98.7°F | Resp 18 | Ht 75.0 in | Wt 202.0 lb

## 2019-11-22 DIAGNOSIS — R42 Dizziness and giddiness: Secondary | ICD-10-CM

## 2019-11-22 DIAGNOSIS — Z09 Encounter for follow-up examination after completed treatment for conditions other than malignant neoplasm: Secondary | ICD-10-CM

## 2019-11-22 DIAGNOSIS — R140 Abdominal distension (gaseous): Secondary | ICD-10-CM

## 2019-11-22 DIAGNOSIS — I10 Essential (primary) hypertension: Secondary | ICD-10-CM

## 2019-11-22 DIAGNOSIS — K219 Gastro-esophageal reflux disease without esophagitis: Secondary | ICD-10-CM

## 2019-11-22 MED ORDER — SIMETHICONE 80 MG PO TABS: 1.0000 | ORAL_TABLET | Freq: Every day | ORAL | 2 refills | Status: DC | PRN

## 2019-11-22 NOTE — Patient Instructions (Signed)
Please continue to take medications as prescribed.  Please increase water intake to at least 2 l in a day.  Please follow up with Cardiologist as scheduled.  If you have episode of persistent dizziness, or chest pain with palpitations, please go to ER immediately.

## 2019-11-22 NOTE — Progress Notes (Signed)
Established Patient Office Visit  Subjective:  Patient ID: Matthew Adkins, male    DOB: Jun 19, 1950  Age: 69 y.o. MRN: [id number]  CC:  Chief Complaint  Patient presents with  . Hospitalization Follow-up    pt was in hospital for infection stomach is still bothering him a little was discharged 11/5 and was in the hospital for about 4 days     HPI Matthew Adkins is a 69 year old male with PMH of HTN, prediabtes, depression, prostate cancer and tobacco abuse presents for follow up after hospitalization for dizziness and UTI.  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 He was given IV fluids and Amlodipine was decreased. Patient had home cardiac monitoring and has a follow up appointment with Cardiologist after 2 weeks. He denies any episode of dizziness since being discharged from the hospital. Denies chest pain, dyspnea or palpitations.  Patient has been having abdominal bloating, but denies any nausea, vomiting, constipation, diarrhea, melena or hematochezia.  Medications were reconciled and discussed with the patient.  Past Medical History:  Diagnosis Date  . Bladder cancer ([place]) 2000  . Depression   . Essential hypertension 01/16/2018  . Nicotine addiction   . Prostate cancer ([place]) 07/06/2014  . Seasonal allergies     Past Surgical History:  Procedure Laterality Date  . athroscopy of right knee  2002  . cancerous polyps removed from bladder  2000  . COLONOSCOPY  MJ 2009 pmhX: POLYPS   POLYP REMOVED BUT NOT RETRIEVED  . COLONOSCOPY  LS 2007 ARS   ? POLYPS, no path available  . COLONOSCOPY  09/26/2010   sessile poylp(2) pan-colonic diverticulosis,mild/internal hemorrhoids  . COLONOSCOPY WITH PROPOFOL N/A 02/09/2017   Procedure: COLONOSCOPY WITH PROPOFOL;  Surgeon: Danie Binder, MD;  [facility];  Service: Endoscopy;  Laterality: N/A;  8:30am  . ESOPHAGOGASTRODUODENOSCOPY (EGD) WITH PROPOFOL N/A 02/09/2017    Procedure: ESOPHAGOGASTRODUODENOSCOPY (EGD) WITH PROPOFOL;  Surgeon: Danie Binder, MD;  [facility];  Service: Endoscopy;  Laterality: N/A;  . FOOT SURGERY Bilateral 2004  . HERNIA REPAIR  1980's   left inguinal hernia  . LYMPHADENECTOMY Bilateral 09/03/2014   Procedure: PELVIC LYMPHADENECTOMY;  Surgeon: Raynelle Bring, MD;  [facility];  Service: Urology;  Laterality: Bilateral;  . POLYPECTOMY  02/09/2017   Procedure: POLYPECTOMY;  Surgeon: Danie Binder, MD;  [facility];  Service: Endoscopy;;  cecal polyp, hepatic flexure polyps x3, transverse colon polyp, descending colon polyps x2, rectal polyps x2  . PROSTATE BIOPSY  07/06/14  . ROBOT ASSISTED LAPAROSCOPIC RADICAL PROSTATECTOMY N/A 09/03/2014   Procedure: ROBOTIC ASSISTED LAPAROSCOPIC RADICAL PROSTATECTOMY LEVEL 2;  Surgeon: Raynelle Bring, MD;  [facility];  Service: Urology;  Laterality: N/A;    Family History  Problem Relation Age of Onset  . Heart attack Father   . Diabetes Sister   . Hypertension Sister   . Hypertension Sister   . Aneurysm Sister        brain   . Arthritis Other        family history   . Colon cancer Neg Hx   . Colon polyps Neg Hx     Social History   Socioeconomic History  . Marital status: Married    Spouse name: Not on file  . Number of children: 2  . Years of education: Not on file  . Highest education level: Not on file  Occupational History  . Occupation: unemployed  Tobacco Use  . Smoking status: Current Every Day Smoker    Packs/day: 1.00    Years: 30.00    Pack years: 30.00    Types: Cigarettes  . Smokeless tobacco: Never Used  Vaping Use  . Vaping Use: Never used  Substance and Sexual Activity  . Alcohol use: Yes    Comment: 3 pints/month  . Drug use: Yes    Types: Marijuana    Comment: occassionally  . Sexual activity: Yes    Birth control/protection: None  Other Topics Concern  . Not on file  Social History Narrative  . Not on file    Social Determinants of Health   Financial Resource Strain: Low Risk   . Difficulty of Paying Living Expenses: Not very hard  Food Insecurity:   . Worried About Charity fundraiser in the Last Year: Not on file  . Ran Out of Food in the Last Year: Not on file  Transportation Needs:   . Lack of Transportation (Medical): Not on file  . Lack of Transportation (Non-Medical): Not on file  Physical Activity:   . Days of Exercise per Week: Not on file  . Minutes of Exercise per Session: Not on file  Stress: No Stress Concern Present  . Feeling of Stress : Only a little  Social Connections: Moderately Integrated  . Frequency of Communication with Friends and Family: Twice a week  . Frequency of Social Gatherings with Friends and Family: Once a week  . Attends Religious Services: 1 to 4 times per year  . Active Member of Clubs or Organizations: No  . Attends Archivist Meetings: Never  . Marital Status: Married  Human resources officer Violence:   . Fear of Current or Ex-Partner: Not on file  . Emotionally Abused: Not on file  . Physically Abused: Not on file  . Sexually Abused: Not on file    Outpatient Medications Prior to Visit  Medication Sig Dispense Refill  . acetaminophen (TYLENOL) 500 MG tablet Take 1,000 mg by mouth every 6 (six) hours as needed for mild pain.    Marland Kitchen amLODipine (NORVASC) 10 MG tablet Take 0.5 tablets (5 mg total) by mouth daily. 30 tablet 1  . buPROPion (WELLBUTRIN SR) 150 MG 12 hr tablet Take 1 tablet (150 mg total) by mouth 2 (two) times daily. 60 tablet 5  . cyclobenzaprine (FLEXERIL) 10 MG tablet Take 1 tablet (10 mg total) by mouth at bedtime. 15 tablet 0  . diazepam (VALIUM) 2 MG tablet Take 1 tablet (2 mg total) by mouth every 6 (six) hours as needed for muscle spasms. 15 tablet 0  . FLUoxetine (PROZAC) 20 MG capsule TAKE 1 CAPSULE BY MOUTH DAILY. 30 capsule 2  . Multiple Vitamins-Minerals (ONE DAILY MENS 50+ MULTIVIT PO) Take 1 tablet by mouth daily.     . nicotine (NICODERM CQ - DOSED IN MG/24 HOURS) 14 mg/24hr patch Place 1 patch (14 mg total) onto the skin daily. 30 patch 3  . omeprazole (PRILOSEC) 20 MG capsule TAKE 1 CAPSULE BY MOUTH 30 MINUTES PRIOR TO BREAKFAST. (Patient taking differently: Take 20 mg by mouth See admin instructions. TAKE 1 CAPSULE BY MOUTH 30 MINUTES PRIOR TO BREAKFAST.) 90 capsule 0   No facility-administered medications prior to visit.    Allergies  Allergen Reactions  . Bee Venom   . Codeine Nausea And Vomiting    ROS Review of Systems  Constitutional: Negative for chills and fever.  HENT: Negative for congestion and sore throat.  Eyes: Negative for pain and discharge.  Respiratory: Negative for cough and shortness of breath.   Cardiovascular: Negative for chest pain and palpitations.  Gastrointestinal: Negative for constipation, diarrhea, nausea and vomiting.       Bloating  Endocrine: Negative for polydipsia and polyuria.  Genitourinary: Negative for dysuria and hematuria.  Musculoskeletal: Negative for neck pain and neck stiffness.  Skin: Negative for rash.  Neurological: Negative for syncope, speech difficulty, weakness, numbness and headaches.  Psychiatric/Behavioral: Negative for agitation and behavioral problems.      Objective:    Physical Exam Vitals reviewed.  Constitutional:      General: He is not in acute distress.    Appearance: He is not diaphoretic.  HENT:     Head: Normocephalic and atraumatic.     Nose: Nose normal.     Mouth/Throat:     Mouth: Mucous membranes are moist.  Eyes:     General: No scleral icterus.    Extraocular Movements: Extraocular movements intact.     Pupils: Pupils are equal, round, and reactive to light.  Cardiovascular:     Rate and Rhythm: Regular rhythm. Bradycardia present.     Heart sounds: Normal heart sounds. No murmur heard.   Pulmonary:     Breath sounds: Normal breath sounds. No wheezing or rales.  Abdominal:     Palpations: Abdomen  is soft.     Tenderness: There is no abdominal tenderness.  Musculoskeletal:     Cervical back: Neck supple. No tenderness.     Right lower leg: No edema.     Left lower leg: No edema.  Skin:    General: Skin is warm.     Findings: No rash.  Neurological:     General: No focal deficit present.     Mental Status: He is alert and oriented to person, place, and time.     Cranial Nerves: No cranial nerve deficit.     Sensory: No sensory deficit.  Psychiatric:        Mood and Affect: Mood normal.        Behavior: Behavior normal.     BP 130/75 (BP Location: Right Arm, Patient Position: Sitting, Cuff Size: Normal)   Pulse 67   Temp 98.7 F (37.1 C) (Oral)   Resp 18   Ht 6' 3"  (1.905 m)   Wt 202 lb (91.6 kg)   SpO2 98%   BMI 25.25 kg/m  Wt Readings from Last 3 Encounters:  11/22/19 202 lb (91.6 kg)  11/08/19 205 lb (93 kg)  08/21/19 200 lb 1.3 oz (90.8 kg)     Health Maintenance Due  Topic Date Due  . TETANUS/TDAP  10/22/2019    There are no preventive care reminders to display for this patient.  Lab Results  Component Value Date   TSH 0.933 08/18/2019   Lab Results  Component Value Date   WBC 4.8 11/10/2019   HGB 13.4 11/10/2019   HCT 41.7 11/10/2019   MCV 93.9 11/10/2019   PLT 191 11/10/2019   Lab Results  Component Value Date   NA 138 11/10/2019   K 3.8 11/10/2019   CO2 25 11/10/2019   GLUCOSE 108 (H) 11/10/2019   BUN 11 11/10/2019   CREATININE 0.98 11/10/2019   BILITOT 0.8 11/09/2019   ALKPHOS 46 11/09/2019   AST 14 (L) 11/09/2019   ALT 13 11/09/2019   PROT 6.0 (L) 11/09/2019   ALBUMIN 3.2 (L) 11/09/2019   CALCIUM 8.6 (L) 11/10/2019   ANIONGAP 7  11/10/2019   Lab Results  Component Value Date   CHOL 179 08/18/2019   Lab Results  Component Value Date   HDL 51 08/18/2019   Lab Results  Component Value Date   LDLCALC 117 (H) 08/18/2019   Lab Results  Component Value Date   TRIG 58 08/18/2019   Lab Results  Component Value Date    CHOLHDL 3.5 08/18/2019   Lab Results  Component Value Date   HGBA1C 5.9 (A) 08/30/2019   HGBA1C 5.9 08/30/2019   HGBA1C 5.9 08/30/2019   HGBA1C 5.9 08/30/2019      Assessment & Plan:   Problem List Items Addressed This Visit      Hospital discharge follow-up    [facility] chart reviewed including discharge summary and lab tests Had dizziness and positive orthostatic vital signs, 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 Had home cardiac monitoring, has a follow up appointment with Cardiologist Continue Amlodipine 5 mg QD S/p UTI treatment Medications reconciled and reviewed with the patient   Dizziness More likely related to dehydration (positive orthostatics in the hospital and reports poor PO intake) Low serum cortisol, will obtain AM Cortisol and ACTH Check CMP to r/o hyponatremia and hypokalemia Advised to increase fluid intake and avoid skipping meals Avoid sudden positional changes  Relevant Orders  Cortisol-am, blood  ACTH  CMP14+EGFR    Cardiovascular and Mediastinum   Essential hypertension BP Readings from Last 1 Encounters:  11/22/19 130/75   Well-controlled with Amlodipine 5 mg QD Counseled for compliance with the medications Advised DASH diet and moderate exercise/walking, at least 150 mins/week      Digestive   GERD (gastroesophageal reflux disease) On Pantoprazole   Relevant Medications   Simethicone 80 MG TABS     Other    Other Visit Diagnoses       Bloating       Relevant Medications   Simethicone 80 MG TABS      Meds ordered this encounter  Medications  . Simethicone 80 MG TABS    Sig: Take 1 tablet (80 mg total) by mouth daily as needed.    Dispense:  30 tablet    Refill:  2    Follow-up: Return in about 3 months (around 02/22/2020), or if symptoms worsen or fail to improve.    Lindell Spar, MD

## 2019-11-23 ENCOUNTER — Ambulatory Visit: Payer: Medicare PPO | Attending: Internal Medicine

## 2019-11-23 DIAGNOSIS — Z23 Encounter for immunization: Secondary | ICD-10-CM

## 2019-11-23 NOTE — Progress Notes (Signed)
   Covid-19 Vaccination Clinic  Name:  Matthew Adkins    MRN: [id number] DOB: Jan 18, 1950  11/23/2019  Matthew Adkins was observed post Covid-19 immunization for 15 minutes without incident. He was provided with Vaccine Information Sheet and instruction to access the V-Safe system.   Matthew Adkins was instructed to call 911 with any severe reactions post vaccine: Marland Kitchen Difficulty breathing  . Swelling of face and throat  . A fast heartbeat  . A bad rash all over body  . Dizziness and weakness   Immunizations Administered    No immunizations on file.

## 2019-11-27 DIAGNOSIS — R42 Dizziness and giddiness: Secondary | ICD-10-CM | POA: Diagnosis not present

## 2019-11-28 ENCOUNTER — Other Ambulatory Visit: Payer: Self-pay | Admitting: Family Medicine

## 2019-11-28 ENCOUNTER — Other Ambulatory Visit: Payer: Self-pay | Admitting: Nurse Practitioner

## 2019-11-28 NOTE — Progress Notes (Deleted)
Cardiology Office Note    Date:  11/28/2019   ID:  Matthew Adkins, DOB March 15, 1950, MRN [id number]  PCP:  Fayrene Helper, MD  Cardiologist: No primary care provider on file. EPS: None  No chief complaint on file.   History of Present Illness:  Matthew Adkins is a 69 y.o. male with history of hypertension, prediabetes, tobacco use, prostate cancer.  Was seen in the hospital by Dr. Harl Bowie 11/09/2019 for dizziness and question of arrhythmia.  Orthostatics in the emergency room blood pressure dropped from 153 over 74-109/61 with standing.  There was some intermittent episodes of narrow complex tachycardia 130-140 while in the emergency room but was unidentifiable and reverted back to normal sinus rhythm.  Dr. Harl Bowie felt it could perhaps be atrial flutter versus an atrial tachycardia.  Potassium was 2.8 and it was felt he was dehydrated and received IV fluids.  Patient remained orthostatic in the hospital and received more IV fluids.  Recommended increase oral hydration and compression stockings if ongoing symptoms may have to decrease Norvasc and start Florinef.  Echo 11/09/2019 normal LVEF 60 to 65% with grade 1 DD aortic dilatation  Past Medical History:  Diagnosis Date  . Bladder cancer ([place]) 2000  . Depression   . Essential hypertension 01/16/2018  . Nicotine addiction   . Prostate cancer ([place]) 07/06/2014  . Seasonal allergies     Past Surgical History:  Procedure Laterality Date  . athroscopy of right knee  2002  . cancerous polyps removed from bladder  2000  . COLONOSCOPY  MJ 2009 pmhX: POLYPS   POLYP REMOVED BUT NOT RETRIEVED  . COLONOSCOPY  LS 2007 ARS   ? POLYPS, no path available  . COLONOSCOPY  09/26/2010   sessile poylp(2) pan-colonic diverticulosis,mild/internal hemorrhoids  . COLONOSCOPY WITH PROPOFOL N/A 02/09/2017   Procedure: COLONOSCOPY WITH PROPOFOL;  Surgeon: Danie Binder, MD;  [facility];  Service: Endoscopy;  Laterality: N/A;  8:30am  .  ESOPHAGOGASTRODUODENOSCOPY (EGD) WITH PROPOFOL N/A 02/09/2017   Procedure: ESOPHAGOGASTRODUODENOSCOPY (EGD) WITH PROPOFOL;  Surgeon: Danie Binder, MD;  [facility];  Service: Endoscopy;  Laterality: N/A;  . FOOT SURGERY Bilateral 2004  . HERNIA REPAIR  1980's   left inguinal hernia  . LYMPHADENECTOMY Bilateral 09/03/2014   Procedure: PELVIC LYMPHADENECTOMY;  Surgeon: Raynelle Bring, MD;  [facility];  Service: Urology;  Laterality: Bilateral;  . POLYPECTOMY  02/09/2017   Procedure: POLYPECTOMY;  Surgeon: Danie Binder, MD;  [facility];  Service: Endoscopy;;  cecal polyp, hepatic flexure polyps x3, transverse colon polyp, descending colon polyps x2, rectal polyps x2  . PROSTATE BIOPSY  07/06/14  . ROBOT ASSISTED LAPAROSCOPIC RADICAL PROSTATECTOMY N/A 09/03/2014   Procedure: ROBOTIC ASSISTED LAPAROSCOPIC RADICAL PROSTATECTOMY LEVEL 2;  Surgeon: Raynelle Bring, MD;  [facility];  Service: Urology;  Laterality: N/A;    Current Medications: No outpatient medications have been marked as taking for the 12/05/19 encounter (Appointment) with Imogene Burn, PA-C.     Allergies:   Bee venom and Codeine   Social History   Socioeconomic History  . Marital status: Married    Spouse name: Not on file  . Number of children: 2  . Years of education: Not on file  . Highest education level: Not on file  Occupational History  . Occupation: unemployed   Tobacco Use  . Smoking status: Current Every Day Smoker    Packs/day: 1.00    Years: 30.00  Pack years: 30.00    Types: Cigarettes  . Smokeless tobacco: Never Used  Vaping Use  . Vaping Use: Never used  Substance and Sexual Activity  . Alcohol use: Yes    Comment: 3 pints/month  . Drug use: Yes    Types: Marijuana    Comment: occassionally  . Sexual activity: Yes    Birth control/protection: None  Other Topics Concern  . Not on file  Social History Narrative  . Not on file   Social Determinants of  Health   Financial Resource Strain: Low Risk   . Difficulty of Paying Living Expenses: Not very hard  Food Insecurity:   . Worried About Charity fundraiser in the Last Year: Not on file  . Ran Out of Food in the Last Year: Not on file  Transportation Needs:   . Lack of Transportation (Medical): Not on file  . Lack of Transportation (Non-Medical): Not on file  Physical Activity:   . Days of Exercise per Week: Not on file  . Minutes of Exercise per Session: Not on file  Stress: No Stress Concern Present  . Feeling of Stress : Only a little  Social Connections: Moderately Integrated  . Frequency of Communication with Friends and Family: Twice a week  . Frequency of Social Gatherings with Friends and Family: Once a week  . Attends Religious Services: 1 to 4 times per year  . Active Member of Clubs or Organizations: No  . Attends Archivist Meetings: Never  . Marital Status: Married     Family History:  The patient's ***family history includes Aneurysm in his sister; Arthritis in an other family member; Diabetes in his sister; Heart attack in his father; Hypertension in his sister and sister.   ROS:   Please see the history of present illness.    ROS All other systems reviewed and are negative.   PHYSICAL EXAM:   VS:  There were no vitals taken for this visit.  Physical Exam  GEN: Well nourished, well developed, in no acute distress  HEENT: normal  Neck: no JVD, carotid bruits, or masses Cardiac:RRR; no murmurs, rubs, or gallops  Respiratory:  clear to auscultation bilaterally, normal work of breathing GI: soft, nontender, nondistended, + BS Ext: without cyanosis, clubbing, or edema, Good distal pulses bilaterally MS: no deformity or atrophy  Skin: warm and dry, no rash Neuro:  Alert and Oriented x 3, Strength and sensation are intact Psych: euthymic mood, full affect  Wt Readings from Last 3 Encounters:  11/22/19 202 lb (91.6 kg)  11/08/19 205 lb (93 kg)   08/21/19 200 lb 1.3 oz (90.8 kg)      Studies/Labs Reviewed:   EKG:  EKG is*** ordered today.  The ekg ordered today demonstrates ***  Recent Labs: 08/18/2019: TSH 0.933 11/09/2019: Magnesium 1.8 11/10/2019: Hemoglobin 13.4; Platelets 191 11/27/2019: ALT 12; BUN 16; Creatinine, Ser 1.04; Potassium 4.0; Sodium 138   Lipid Panel    Component Value Date/Time   CHOL 179 08/18/2019 1333   TRIG 58 08/18/2019 1333   HDL 51 08/18/2019 1333   CHOLHDL 3.5 08/18/2019 1333   CHOLHDL 3.6 01/21/2018 1121   VLDL 9 01/21/2018 1121   LDLCALC 117 (H) 08/18/2019 1333   LDLDIRECT 105 08/22/2014 1020    Additional studies/ records that were reviewed today include:  Echo 11/09/2019 IMPRESSIONS     1. Left ventricular ejection fraction, by estimation, is 60 to 65%. The  left ventricle has normal function. The left  ventricle has no regional  wall motion abnormalities. There is mild left ventricular hypertrophy.  Left ventricular diastolic parameters  are consistent with Grade I diastolic dysfunction (impaired relaxation).   2. Right ventricular systolic function is normal. The right ventricular  size is normal.   3. Left atrial size was mild to moderately dilated.   4. The mitral valve is normal in structure. Mild mitral valve  regurgitation. No evidence of mitral stenosis.   5. The aortic valve has an indeterminant number of cusps. Aortic valve  regurgitation is not visualized. No aortic stenosis is present.   6. Aortic dilatation noted. There is mild dilatation of the aortic root,  measuring 41 mm.   7. The inferior vena cava is normal in size with greater than 50%  respiratory variability, suggesting right atrial pressure of 3 mmHg.      ASSESSMENT:    1. Orthostatic dizziness   2. Atrial tachycardia ([place])   3. Essential hypertension   4. Prediabetes      PLAN:  In order of problems listed above:  Orthostatic hypotension secondary to poor oral intake and dehydration treated  with IV fluids.  Hypokalemia potassium replaced  Tachyarrhythmia narrow complex unidentifiable on telemetry strips in the hospital question atrial tachycardia versus atrial flutter.  Wearing 2-week monitor  Hypertension blood pressures were variable in the hospital between 1 41/03/129 systolic on amlodipine  Prediabetes A1c 5.9 08/2019    Medication Adjustments/Labs and Tests Ordered: Current medicines are reviewed at length with the patient today.  Concerns regarding medicines are outlined above.  Medication changes, Labs and Tests ordered today are listed in the Patient Instructions below. There are no Patient Instructions on file for this visit.   Sumner Boast, PA-C  11/28/2019 2:30 PM    [facility] [address], [place], [place]  [address] Phone: [phone]; Fax: [phone]

## 2019-11-29 ENCOUNTER — Other Ambulatory Visit: Payer: Self-pay | Admitting: Internal Medicine

## 2019-11-29 ENCOUNTER — Encounter: Payer: Self-pay | Admitting: Gastroenterology

## 2019-11-29 DIAGNOSIS — E271 Primary adrenocortical insufficiency: Secondary | ICD-10-CM

## 2019-11-29 LAB — ACTH: ACTH: 68.8 pg/mL — ABNORMAL HIGH (ref 7.2–63.3)

## 2019-11-29 LAB — CMP14+EGFR
ALT: 12 IU/L (ref 0–44)
AST: 16 IU/L (ref 0–40)
Albumin/Globulin Ratio: 1.7 (ref 1.2–2.2)
Albumin: 4.3 g/dL (ref 3.7–4.7)
Alkaline Phosphatase: 74 IU/L (ref 44–121)
BUN/Creatinine Ratio: 15 (ref 10–24)
BUN: 16 mg/dL (ref 8–27)
Bilirubin Total: 0.5 mg/dL (ref 0.0–1.2)
CO2: 23 mmol/L (ref 20–29)
Calcium: 8.9 mg/dL (ref 8.6–10.2)
Chloride: 100 mmol/L (ref 96–106)
Creatinine, Ser: 1.04 mg/dL (ref 0.76–1.27)
GFR calc Af Amer: 83 mL/min/{1.73_m2} (ref >59)
GFR calc non Af Amer: 72 mL/min/{1.73_m2} (ref >59)
Globulin, Total: 2.6 g/dL (ref 1.5–4.5)
Glucose: 115 mg/dL — ABNORMAL HIGH (ref 65–99)
Potassium: 4 mmol/L (ref 3.5–5.2)
Sodium: 138 mmol/L (ref 134–144)
Total Protein: 6.9 g/dL (ref 6.0–8.5)

## 2019-11-29 LAB — CORTISOL-AM, BLOOD: Cortisol - AM: 13 ug/dL (ref 6.2–19.4)

## 2019-11-29 NOTE — Telephone Encounter (Signed)
Limited refills provided since pt last seen in 2019.  Please schedule ov.

## 2019-11-29 NOTE — Telephone Encounter (Signed)
PATIENT SENT LETTER THEY WERE DUE FOR APPOINTMENT

## 2019-12-01 DIAGNOSIS — R001 Bradycardia, unspecified: Secondary | ICD-10-CM | POA: Diagnosis not present

## 2019-12-05 ENCOUNTER — Other Ambulatory Visit (INDEPENDENT_AMBULATORY_CARE_PROVIDER_SITE_OTHER): Payer: Medicare PPO

## 2019-12-05 ENCOUNTER — Other Ambulatory Visit: Payer: Self-pay

## 2019-12-05 ENCOUNTER — Ambulatory Visit: Payer: Medicare PPO | Admitting: Physician Assistant

## 2019-12-05 DIAGNOSIS — I471 Supraventricular tachycardia: Secondary | ICD-10-CM

## 2019-12-05 DIAGNOSIS — R42 Dizziness and giddiness: Secondary | ICD-10-CM

## 2019-12-05 DIAGNOSIS — I10 Essential (primary) hypertension: Secondary | ICD-10-CM

## 2019-12-05 DIAGNOSIS — R001 Bradycardia, unspecified: Secondary | ICD-10-CM

## 2019-12-05 DIAGNOSIS — R7303 Prediabetes: Secondary | ICD-10-CM

## 2019-12-05 NOTE — Progress Notes (Signed)
Cardiology Clinic Note   Patient Name: Matthew Adkins Date of Encounter: 12/06/2019  Primary Care Provider:  Fayrene Helper, MD Primary Cardiologist:  Carlyle Dolly, MD  Patient Profile    Matthew Adkins 69 year old male presents to the clinic today for evaluation of his orthostatic dizziness, dyslipidemia, and essential hypertension.  Also here to review ZIO monitor results  Past Medical History    Past Medical History:  Diagnosis Date  . Bladder cancer (Matthew Adkins) 2000  . Depression   . Essential hypertension 01/16/2018  . Nicotine addiction   . Prostate cancer (Matthew Adkins) 07/06/2014  . Seasonal allergies    Past Surgical History:  Procedure Laterality Date  . athroscopy of right knee  2002  . cancerous polyps removed from bladder  2000  . COLONOSCOPY  Matthew Adkins 2009 pmhX: POLYPS   POLYP REMOVED BUT NOT RETRIEVED  . COLONOSCOPY  Matthew Adkins 2007 ARS   ? POLYPS, no path available  . COLONOSCOPY  09/26/2010   sessile poylp(2) pan-colonic diverticulosis,mild/internal hemorrhoids  . COLONOSCOPY WITH PROPOFOL N/A 02/09/2017   Procedure: COLONOSCOPY WITH PROPOFOL;  Surgeon: Danie Binder, MD;  Location: [facility];  Service: Endoscopy;  Laterality: N/A;  8:30am  . ESOPHAGOGASTRODUODENOSCOPY (EGD) WITH PROPOFOL N/A 02/09/2017   Procedure: ESOPHAGOGASTRODUODENOSCOPY (EGD) WITH PROPOFOL;  Surgeon: Danie Binder, MD;  Location: [facility];  Service: Endoscopy;  Laterality: N/A;  . FOOT SURGERY Bilateral 2004  . HERNIA REPAIR  1980's   left inguinal hernia  . LYMPHADENECTOMY Bilateral 09/03/2014   Procedure: PELVIC LYMPHADENECTOMY;  Surgeon: Matthew Bring, MD;  Location: [facility];  Service: Urology;  Laterality: Bilateral;  . POLYPECTOMY  02/09/2017   Procedure: POLYPECTOMY;  Surgeon: Danie Binder, MD;  Location: [facility];  Service: Endoscopy;;  cecal polyp, hepatic flexure polyps x3, transverse colon polyp, descending colon polyps x2, rectal polyps x2  . PROSTATE BIOPSY  07/06/14  . ROBOT  ASSISTED LAPAROSCOPIC RADICAL PROSTATECTOMY N/A 09/03/2014   Procedure: ROBOTIC ASSISTED LAPAROSCOPIC RADICAL PROSTATECTOMY LEVEL 2;  Surgeon: Matthew Bring, MD;  Location: [facility];  Service: Urology;  Laterality: N/A;    Allergies  Allergies  Allergen Reactions  . Bee Venom   . Codeine Nausea And Vomiting    History of Present Illness    [name] has a PMH of essential hypertension, GERD, DJD, bladder cancer, prostate cancer, prediabetes, dyslipidemia, dizziness, hypokalemia, and orthostatic hypotension.  He was seen and evaluated by Dr. Harl Bowie in the hospital 11/10/2019.  During that time no recurrent arrhythmias were seen on telemetry.  A 2-week cardiac event monitor was prescribed.  His echo 11/09/2019 showed normal LVEF, G1 DD, mild to moderate left atrial dilation, mild mitral valve regurgitation, and mild dilation of aortic root 41 mm.  He presents to the clinic today for follow-up evaluation states he feels well today.  He has had no further episodes of lightheadedness/dizziness.  He indicates that while he was wearing his cardiac event monitor he did not notice any episodes of dizziness either.  He has focused on increasing his hydration and is drinking 3-4 bottles of water per day.  He reports that he is the primary caregiver for his wife and his sister who both have dementia.  He has a home health aide who comes to his home once per week however, he is providing all of their other care.  He indicates that he is eating a low-sodium diet and uses Mrs. Dash as seasoning.  We talked about using lower extremity  support stockings and abdominal binder to help with symptoms of dizziness and reduce fluid shift.  I have also told him that he may slightly increase his sodium intake.  I will have him monitor his blood pressure weekly.  He indicates that he does not have a cough we will contact Matthew Adkins for assistance with this.  I instructed him that Dr. Harl Bowie will call him with results from  his cardiac event monitor and we will have him follow-up in 3 months.  Today he denies chest pain, shortness of breath, lower extremity edema, fatigue, palpitations, melena, hematuria, hemoptysis, diaphoresis, weakness, presyncope, syncope, orthopnea, and PND.   Home Medications    Prior to Admission medications   Medication Sig Start Date End Date Taking? Authorizing Provider  acetaminophen (TYLENOL) 500 MG tablet Take 1,000 mg by mouth every 6 (six) hours as needed for mild pain.    [provider]  amLODipine (NORVASC) 10 MG tablet Take 0.5 tablets (5 mg total) by mouth daily. 11/10/19   Roxan Hockey, MD  buPROPion (WELLBUTRIN SR) 150 MG 12 hr tablet Take 1 tablet (150 mg total) by mouth 2 (two) times daily. 11/21/18   Fayrene Helper, MD  cyclobenzaprine (FLEXERIL) 10 MG tablet Take 1 tablet (10 mg total) by mouth at bedtime. 02/17/18   Fayrene Helper, MD  diazepam (VALIUM) 2 MG tablet Take 1 tablet (2 mg total) by mouth every 6 (six) hours as needed for muscle spasms. 06/23/19   Daleen Bo, MD  FLUoxetine (PROZAC) 20 MG capsule TAKE 1 CAPSULE BY MOUTH DAILY. 11/28/19   Fayrene Helper, MD  Multiple Vitamins-Minerals (ONE DAILY MENS 50+ MULTIVIT PO) Take 1 tablet by mouth daily.    [provider]  nicotine (NICODERM CQ - DOSED IN MG/24 HOURS) 14 mg/24hr patch Place 1 patch (14 mg total) onto the skin daily. 11/10/19 11/09/20  Roxan Hockey, MD  omeprazole (PRILOSEC) 20 MG capsule TAKE 1 CAPSULE BY MOUTH 30 MINUTES PRIOR TO BREAKFAST. 11/29/19   Mahala Menghini, PA-C  Simethicone 80 MG TABS Take 1 tablet (80 mg total) by mouth daily as needed. 11/22/19   Lindell Spar, MD    Family History    Family History  Problem Relation Age of Onset  . Heart attack Father   . Diabetes Sister   . Hypertension Sister   . Hypertension Sister   . Aneurysm Sister        brain   . Arthritis Other        family history   . Colon cancer Neg Hx   . Colon polyps  Neg Hx    He indicated that his mother is deceased. He indicated that his father is deceased. He indicated that five of his six sisters are alive. He indicated that his brother is alive. He indicated that his daughter is alive. He indicated that his son is alive. He indicated that the status of his neg hx is unknown. He indicated that the status of his other is unknown.  Social History    Social History   Socioeconomic History  . Marital status: Married    Spouse name: Not on file  . Number of children: 2  . Years of education: Not on file  . Highest education level: Not on file  Occupational History  . Occupation: unemployed   Tobacco Use  . Smoking status: Current Every Day Smoker    Packs/day: 1.00    Years: 30.00    Pack years:  30.00    Types: Cigarettes  . Smokeless tobacco: Never Used  Vaping Use  . Vaping Use: Never used  Substance and Sexual Activity  . Alcohol use: Yes    Comment: 3 pints/month  . Drug use: Yes    Types: Marijuana    Comment: occassionally  . Sexual activity: Yes    Birth control/protection: None  Other Topics Concern  . Not on file  Social History Narrative  . Not on file   Social Determinants of Health   Financial Resource Strain: Low Risk   . Difficulty of Paying Living Expenses: Not very hard  Food Insecurity:   . Worried About Charity fundraiser in the Last Year: Not on file  . Ran Out of Food in the Last Year: Not on file  Transportation Needs:   . Lack of Transportation (Medical): Not on file  . Lack of Transportation (Non-Medical): Not on file  Physical Activity:   . Days of Exercise per Week: Not on file  . Minutes of Exercise per Session: Not on file  Stress: No Stress Concern Present  . Feeling of Stress : Only a little  Social Connections: Moderately Integrated  . Frequency of Communication with Friends and Family: Twice a week  . Frequency of Social Gatherings with Friends and Family: Once a week  . Attends Religious  Services: 1 to 4 times per year  . Active Member of Clubs or Organizations: No  . Attends Archivist Meetings: Never  . Marital Status: Married  Human resources officer Violence:   . Fear of Current or Ex-Partner: Not on file  . Emotionally Abused: Not on file  . Physically Abused: Not on file  . Sexually Abused: Not on file     Review of Systems    General:  No chills, fever, night sweats or weight changes.  Cardiovascular:  No chest pain, dyspnea on exertion, edema, orthopnea, palpitations, paroxysmal nocturnal dyspnea. Dermatological: No rash, lesions/masses Respiratory: No cough, dyspnea Urologic: No hematuria, dysuria Abdominal:   No nausea, vomiting, diarrhea, bright red blood per rectum, melena, or hematemesis Neurologic:  No visual changes, wkns, changes in mental status. All other systems reviewed and are otherwise negative except as noted above.  Physical Exam    VS:  BP 138/82   Pulse (!) 58   Ht 6\' 3"  (1.905 m)   Wt 204 lb 6.4 oz (92.7 kg)   SpO2 96%   BMI 25.55 kg/m  , BMI Body mass index is 25.55 kg/m. GEN: Well nourished, well developed, in no acute distress. HEENT: normal. Neck: Supple, no JVD, carotid bruits, or masses. Cardiac: RRR, no murmurs, rubs, or gallops. No clubbing, cyanosis, edema.  Radials/DP/PT 2+ and equal bilaterally.  Respiratory:  Respirations regular and unlabored, clear to auscultation bilaterally. GI: Soft, nontender, nondistended, BS + x 4. MS: no deformity or atrophy. Skin: warm and dry, no rash. Neuro:  Strength and sensation are intact. Psych: Normal affect.  Accessory Clinical Findings    Recent Labs: 08/18/2019: TSH 0.933 11/09/2019: Magnesium 1.8 11/10/2019: Hemoglobin 13.4; Platelets 191 11/27/2019: ALT 12; BUN 16; Creatinine, Ser 1.04; Potassium 4.0; Sodium 138   Recent Lipid Panel    Component Value Date/Time   CHOL 179 08/18/2019 1333   TRIG 58 08/18/2019 1333   HDL 51 08/18/2019 1333   CHOLHDL 3.5 08/18/2019  1333   CHOLHDL 3.6 01/21/2018 1121   VLDL 9 01/21/2018 1121   LDLCALC 117 (H) 08/18/2019 1333   LDLDIRECT 105 08/22/2014 1020  ECG personally reviewed by me today-none today.  Echocardiogram 11/09/2019 IMPRESSIONS    1. Left ventricular ejection fraction, by estimation, is 60 to 65%. The  left ventricle has normal function. The left ventricle has no regional  wall motion abnormalities. There is mild left ventricular hypertrophy.  Left ventricular diastolic parameters  are consistent with Grade I diastolic dysfunction (impaired relaxation).  2. Right ventricular systolic function is normal. The right ventricular  size is normal.  3. Left atrial size was mild to moderately dilated.  4. The mitral valve is normal in structure. Mild mitral valve  regurgitation. No evidence of mitral stenosis.  5. The aortic valve has an indeterminant number of cusps. Aortic valve  regurgitation is not visualized. No aortic stenosis is present.  6. Aortic dilatation noted. There is mild dilatation of the aortic root,  measuring 41 mm.  7. The inferior vena cava is normal in size with greater than 50%  respiratory variability, suggesting right atrial pressure of 3 mmHg.  ZIO monitor not yet resulted    Assessment & Plan   1.  Orthostatic hypotension-BP today 138/82.  Denies further episodes of intermittent dizziness.  Has increased p.o. hydration.  Previously noted to have systolic blood pressure drop from 30 points when moving from sitting to standing.  Given IV fluids. Order midodrine 5 mg 3 times daily Lower extremity support stockings Abdominal binder Maintain p.o. hydration Slowly increase physical activity as tolerated Awaiting results of ZIO monitor  Intermittent tachycardia-heart rate today 58.  Felt to be related to dehydration.  Converted back to normal sinus rhythm with a heart rate in the 50s and 60s.  Recommended not starting AV nodal blocking agents due to his baseline  bradycardia.  Essential hypertension-BP today 138/82.  Well-controlled at home. Continue amlodipine Heart healthy low-sodium diet-salty 6 given Increase physical activity as tolerated [facility] management working on obtaining blood pressure cuff for patient.  Hypokalemia-resolved.  BMP 11/27/2019 showed potassium of 4.0. Follows with PCP  Disposition: Follow-up with Dr. Harl Bowie in 3 months.   Jossie Ng. Keiden Deskin NP-C    12/06/2019, 1:55 PM [facility] [place] [address] Office [phone] Fax [phone]  Notice: This dictation was prepared with Dragon dictation along with smaller phrase technology. Any transcriptional errors that result from this process are unintentional and may not be corrected upon review.

## 2019-12-06 ENCOUNTER — Other Ambulatory Visit: Payer: Self-pay

## 2019-12-06 ENCOUNTER — Telehealth: Payer: Self-pay | Admitting: Cardiology

## 2019-12-06 ENCOUNTER — Encounter: Payer: Self-pay | Admitting: General Practice

## 2019-12-06 ENCOUNTER — Ambulatory Visit (INDEPENDENT_AMBULATORY_CARE_PROVIDER_SITE_OTHER): Payer: Medicare PPO | Admitting: General Practice

## 2019-12-06 VITALS — BP 138/82 | HR 58 | Ht 75.0 in | Wt 204.4 lb

## 2019-12-06 DIAGNOSIS — R000 Tachycardia, unspecified: Secondary | ICD-10-CM

## 2019-12-06 DIAGNOSIS — I951 Orthostatic hypotension: Secondary | ICD-10-CM

## 2019-12-06 DIAGNOSIS — I10 Essential (primary) hypertension: Secondary | ICD-10-CM | POA: Diagnosis not present

## 2019-12-06 DIAGNOSIS — E876 Hypokalemia: Secondary | ICD-10-CM | POA: Diagnosis not present

## 2019-12-06 NOTE — Patient Instructions (Addendum)
Medication Instructions:  Continue all current medications.  Labwork: none  Testing/Procedures: none  Follow-Up: 3 months   Any Other Special Instructions Will Be Listed Below (If Applicable).  Please wear your support hose during the day, may take off at night.  Increase your physical activity as tolerated.   May increase salt intake a little.   Make sure to stay adequately hydrated.  If you need a refill on your cardiac medications before your next appointment, please call your pharmacy.

## 2019-12-06 NOTE — Telephone Encounter (Signed)
  Patient Consent for Virtual Visit         Matthew Adkins has provided verbal consent on 12/06/2019 for a virtual visit (video or telephone).   CONSENT FOR VIRTUAL VISIT FOR:  Matthew Adkins  By participating in this virtual visit I agree to the following:  I hereby voluntarily request, consent and authorize [facility] and its employed or contracted physicians, physician assistants, nurse practitioners or other licensed health care professionals (the Practitioner), to provide me with telemedicine health care services (the "Services") as deemed necessary by the treating Practitioner. I acknowledge and consent to receive the Services by the Practitioner via telemedicine. I understand that the telemedicine visit will involve communicating with the Practitioner through live audiovisual communication technology and the disclosure of certain medical information by electronic transmission. I acknowledge that I have been given the opportunity to request an in-person assessment or other available alternative prior to the telemedicine visit and am voluntarily participating in the telemedicine visit.  I understand that I have the right to withhold or withdraw my consent to the use of telemedicine in the course of my care at any time, without affecting my right to future care or treatment, and that the Practitioner or I may terminate the telemedicine visit at any time. I understand that I have the right to inspect all information obtained and/or recorded in the course of the telemedicine visit and may receive copies of available information for a reasonable fee.  I understand that some of the potential risks of receiving the Services via telemedicine include:  Marland Kitchen Delay or interruption in medical evaluation due to technological equipment failure or disruption; . Information transmitted may not be sufficient (e.g. poor resolution of images) to allow for appropriate medical decision making by the  Practitioner; and/or  . In rare instances, security protocols could fail, causing a breach of personal health information.  Furthermore, I acknowledge that it is my responsibility to provide information about my medical history, conditions and care that is complete and accurate to the best of my ability. I acknowledge that Practitioner's advice, recommendations, and/or decision may be based on factors not within their control, such as incomplete or inaccurate data provided by me or distortions of diagnostic images or specimens that may result from electronic transmissions. I understand that the practice of medicine is not an exact science and that Practitioner makes no warranties or guarantees regarding treatment outcomes. I acknowledge that a copy of this consent can be made available to me via my patient portal ([facility]), or I can request a printed copy by calling the office of [facility].    I understand that my insurance will be billed for this visit.   I have read or had this consent read to me. . I understand the contents of this consent, which adequately explains the benefits and risks of the Services being provided via telemedicine.  . I have been provided ample opportunity to ask questions regarding this consent and the Services and have had my questions answered to my satisfaction. . I give my informed consent for the services to be provided through the use of telemedicine in my medical care

## 2019-12-07 ENCOUNTER — Telehealth: Payer: Self-pay | Admitting: Licensed Clinical Social Worker

## 2019-12-07 NOTE — Telephone Encounter (Signed)
CSW referred to assist patient with obtaining a BP cuff. CSW contacted patient to inform cuff will be delivered to home. Patient grateful for support and assistance. CSW available as needed. Jackie Nashley [place], LCSW, CCSW-MCS [phone]  

## 2019-12-11 ENCOUNTER — Ambulatory Visit: Payer: Medicare PPO | Admitting: "Endocrinology

## 2019-12-11 ENCOUNTER — Other Ambulatory Visit: Payer: Self-pay

## 2019-12-11 ENCOUNTER — Encounter: Payer: Self-pay | Admitting: "Endocrinology

## 2019-12-11 VITALS — BP 120/80 | HR 64 | Ht 75.0 in | Wt 205.0 lb

## 2019-12-11 DIAGNOSIS — E349 Endocrine disorder, unspecified: Secondary | ICD-10-CM

## 2019-12-11 NOTE — Progress Notes (Signed)
Endocrinology Consult Note                                            12/11/2019, 3:47 PM   Subjective:    Patient ID: Matthew Adkins, male    DOB: August 03, 1950, PCP Fayrene Helper, MD   Past Medical History:  Diagnosis Date  . Bladder cancer ([place]) 2000  . Depression   . Essential hypertension 01/16/2018  . Nicotine addiction   . Prostate cancer ([place]) 07/06/2014  . Seasonal allergies    Past Surgical History:  Procedure Laterality Date  . athroscopy of right knee  2002  . cancerous polyps removed from bladder  2000  . COLONOSCOPY  MJ 2009 pmhX: POLYPS   POLYP REMOVED BUT NOT RETRIEVED  . COLONOSCOPY  LS 2007 ARS   ? POLYPS, no path available  . COLONOSCOPY  09/26/2010   sessile poylp(2) pan-colonic diverticulosis,mild/internal hemorrhoids  . COLONOSCOPY WITH PROPOFOL N/A 02/09/2017   Procedure: COLONOSCOPY WITH PROPOFOL;  Surgeon: Danie Binder, MD;  [facility];  Service: Endoscopy;  Laterality: N/A;  8:30am  . ESOPHAGOGASTRODUODENOSCOPY (EGD) WITH PROPOFOL N/A 02/09/2017   Procedure: ESOPHAGOGASTRODUODENOSCOPY (EGD) WITH PROPOFOL;  Surgeon: Danie Binder, MD;  Location: [facility];  Service: Endoscopy;  Laterality: N/A;  . FOOT SURGERY Bilateral 2004  . HERNIA REPAIR  1980's   left inguinal hernia  . LYMPHADENECTOMY Bilateral 09/03/2014   Procedure: PELVIC LYMPHADENECTOMY;  Surgeon: Raynelle Bring, MD;  Location: [facility];  Service: Urology;  Laterality: Bilateral;  . POLYPECTOMY  02/09/2017   Procedure: POLYPECTOMY;  Surgeon: Danie Binder, MD;  Location[facility];  Service: Endoscopy;;  cecal polyp, hepatic flexure polyps x3, transverse colon polyp, descending colon polyps x2, rectal polyps x2  . PROSTATE BIOPSY  07/06/14  . ROBOT ASSISTED LAPAROSCOPIC RADICAL PROSTATECTOMY N/A 09/03/2014   Procedure: ROBOTIC ASSISTED LAPAROSCOPIC RADICAL PROSTATECTOMY LEVEL 2;  Surgeon: Raynelle Bring, MD;  Location: [facility];  Service: Urology;  Laterality: N/A;    Social History   Socioeconomic History  . Marital status: Married    Spouse name: Not on file  . Number of children: 2  . Years of education: Not on file  . Highest education level: Not on file  Occupational History  . Occupation: unemployed   Tobacco Use  . Smoking status: Current Every Day Smoker    Packs/day: 1.00    Years: 30.00    Pack years: 30.00    Types: Cigarettes  . Smokeless tobacco: Never Used  Vaping Use  . Vaping Use: Never used  Substance and Sexual Activity  . Alcohol use: Yes    Comment: 3 pints/month  . Drug use: Yes    Types: Marijuana    Comment: occassionally  . Sexual activity: Yes    Birth control/protection: None  Other Topics Concern  . Not on file  Social History Narrative  . Not on file   Social Determinants of Health   Financial Resource Strain: Low Risk   . Difficulty of Paying Living Expenses: Not very hard  Food Insecurity:   . Worried About Charity fundraiser in the Last Year: Not on file  . Ran Out of Food in the Last Year: Not on file  Transportation Needs:   . Lack of Transportation (Medical): Not on file  . Lack of  Transportation (Non-Medical): Not on file  Physical Activity:   . Days of Exercise per Week: Not on file  . Minutes of Exercise per Session: Not on file  Stress: No Stress Concern Present  . Feeling of Stress : Only a little  Social Connections: Moderately Integrated  . Frequency of Communication with Friends and Family: Twice a week  . Frequency of Social Gatherings with Friends and Family: Once a week  . Attends Religious Services: 1 to 4 times per year  . Active Member of Clubs or Organizations: No  . Attends Archivist Meetings: Never  . Marital Status: Married   Family History  Problem Relation Age of Onset  . Heart attack Father   . Diabetes Sister   . Hypertension Sister   . Hypertension Sister   . Aneurysm Sister        brain   . Arthritis Other        family history   . Colon  cancer Neg Hx   . Colon polyps Neg Hx    Outpatient Encounter Medications as of 12/11/2019  Medication Sig  . acetaminophen (TYLENOL) 500 MG tablet Take 1,000 mg by mouth every 6 (six) hours as needed for mild pain.  Marland Kitchen amLODipine (NORVASC) 10 MG tablet Take 0.5 tablets (5 mg total) by mouth daily.  Marland Kitchen buPROPion (WELLBUTRIN SR) 150 MG 12 hr tablet Take 1 tablet (150 mg total) by mouth 2 (two) times daily.  . diazepam (VALIUM) 2 MG tablet Take 1 tablet (2 mg total) by mouth every 6 (six) hours as needed for muscle spasms.  Marland Kitchen FLUoxetine (PROZAC) 20 MG capsule TAKE 1 CAPSULE BY MOUTH DAILY.  . Multiple Vitamins-Minerals (ONE DAILY MENS 50+ MULTIVIT PO) Take 1 tablet by mouth daily.  Marland Kitchen omeprazole (PRILOSEC) 20 MG capsule TAKE 1 CAPSULE BY MOUTH 30 MINUTES PRIOR TO BREAKFAST.  Marland Kitchen Simethicone 80 MG TABS Take 1 tablet (80 mg total) by mouth daily as needed.   No facility-administered encounter medications on file as of 12/11/2019.   ALLERGIES: Allergies  Allergen Reactions  . Bee Venom   . Codeine Nausea And Vomiting    VACCINATION STATUS: Immunization History  Administered Date(s) Administered  . Fluad Quad(high Dose 65+) 10/03/2018  . Influenza Split 09/20/2010, 10/06/2011  . Influenza Whole 10/02/2008, 09/19/2009  . Influenza, High Dose Seasonal PF 11/05/2017, 11/07/2019  . Influenza,inj,Quad PF,6+ Mos 09/19/2014, 10/23/2015, 10/07/2016  . Moderna SARS-COV2 Booster Vaccination 11/23/2019  . Moderna SARS-COVID-2 Vaccination 02/12/2019, 03/15/2019  . Pneumococcal Conjugate-13 05/30/2014  . Pneumococcal Polysaccharide-23 10/23/2015  . Td 10/21/2009  . Zoster 06/25/2010    HPI Matthew Adkins is 69 y.o. male who presents today with a medical history as above. he is being seen in consultation for hypocortisolemia requested by Fayrene Helper, MD. History is obtained directly from the patient as well as from chart review.  He denies any prior history of adrenal, thyroid, pituitary  dysfunction.  He denies any exposure to heavy dose steroids.  He has consistent and steady body weight.  He denies any abdominal injury or surgery to the adrenals. He was found to have random cortisol of 7.3 on November 10, 2019.  Repeat labs on November 27, 2019 showed cortisol improving to 13 along with ACTH of 68.8. He denies dizziness, lightheadedness, nausea/vomiting.  He is a chronic smoker.   He has well-controlled hypertension on amlodipine 5 mg p.o. daily.   -He denies any prior history of diabetes, A1c in August was 5.9%.  Review of Systems  Constitutional: no recent weight gain/loss, no fatigue, no subjective hyperthermia, no subjective hypothermia Eyes: no blurry vision, no xerophthalmia ENT: no sore throat, no nodules palpated in throat, no dysphagia/odynophagia, no hoarseness Cardiovascular: no Chest Pain, no Shortness of Breath, no palpitations, no leg swelling Respiratory: no cough, no shortness of breath Gastrointestinal: no Nausea/Vomiting/Diarhhea Musculoskeletal: no muscle/joint aches Skin: no rashes Neurological: no tremors, no numbness, no tingling, no dizziness Psychiatric: no depression, no anxiety  Objective:    Vitals with BMI 12/11/2019 12/06/2019 11/22/2019  Height 6\' 3"  6\' 3"  6\' 3"   Weight 205 lbs 204 lbs 6 oz 202 lbs  BMI 25.62 72.09 47.09  Systolic 628 366 294  Diastolic 80 82 75  Pulse 64 58 67    BP 120/80   Pulse 64   Ht 6\' 3"  (1.905 m)   Wt 205 lb (93 kg)   BMI 25.62 kg/m   Wt Readings from Last 3 Encounters:  12/11/19 205 lb (93 kg)  12/06/19 204 lb 6.4 oz (92.7 kg)  11/22/19 202 lb (91.6 kg)    Physical Exam  Constitutional:  Body mass index is 25.62 kg/m.,  not in acute distress, normal state of mind Eyes: PERRLA, EOMI, no exophthalmos ENT: moist mucous membranes, no gross thyromegaly, no gross cervical lymphadenopathy Cardiovascular: normal precordial activity, Regular Rate and Rhythm, no Murmur/Rubs/Gallops Respiratory:  adequate  breathing efforts, no gross chest deformity, Clear to auscultation bilaterally Gastrointestinal: abdomen soft, Non -tender, No distension, Bowel Sounds present, no gross organomegaly Musculoskeletal: no gross deformities, strength intact in all four extremities Skin: moist, warm, no rashes Neurological: no tremor with outstretched hands, Deep tendon reflexes normal in bilateral lower extremities.  CMP ( most recent) CMP     Component Value Date/Time   NA 138 11/27/2019 0842   K 4.0 11/27/2019 0842   CL 100 11/27/2019 0842   CO2 23 11/27/2019 0842   GLUCOSE 115 (H) 11/27/2019 0842   GLUCOSE 108 (H) 11/10/2019 0719   BUN 16 11/27/2019 0842   CREATININE 1.04 11/27/2019 0842   CREATININE 0.92 05/29/2015 1155   CALCIUM 8.9 11/27/2019 0842   PROT 6.9 11/27/2019 0842   ALBUMIN 4.3 11/27/2019 0842   AST 16 11/27/2019 0842   ALT 12 11/27/2019 0842   ALKPHOS 74 11/27/2019 0842   BILITOT 0.5 11/27/2019 0842   GFRNONAA 72 11/27/2019 0842   GFRNONAA >60 11/10/2019 0719   GFRNONAA 85 08/22/2014 1020   GFRAA 83 11/27/2019 0842   GFRAA >89 08/22/2014 1020     Diabetic Labs (most recent): Lab Results  Component Value Date   HGBA1C 5.9 (A) 08/30/2019   HGBA1C 5.9 08/30/2019   HGBA1C 5.9 08/30/2019   HGBA1C 5.9 08/30/2019     Lipid Panel ( most recent) Lipid Panel     Component Value Date/Time   CHOL 179 08/18/2019 1333   TRIG 58 08/18/2019 1333   HDL 51 08/18/2019 1333   CHOLHDL 3.5 08/18/2019 1333   CHOLHDL 3.6 01/21/2018 1121   VLDL 9 01/21/2018 1121   LDLCALC 117 (H) 08/18/2019 1333   LDLDIRECT 105 08/22/2014 1020   LABVLDL 11 08/18/2019 1333      Lab Results  Component Value Date   TSH 0.933 08/18/2019   TSH 1.530 07/02/2017   TSH 0.778 06/30/2016   TSH 1.06 05/29/2015   TSH 1.463 05/19/2014   TSH 0.865 01/31/2013   TSH 0.942 09/11/2010   TSH 0.803 06/25/2010   TSH 1.391 10/22/2009   TSH 2.168 08/31/2008  Results for Matthew Adkins, Matthew Adkins (MRN  [id number]) as of 12/12/2019 13:01  Ref. Range 11/10/2019 15:12 11/27/2019 08:42  ACTH Latest Ref Range: 7.2 - 63.3 pg/mL  68.8 (H)  Cortisol, Plasma Latest Units: ug/dL 7.3   Cortisol - AM Latest Ref Range: 6.2 - 19.4 ug/dL  13.0    Assessment & Plan:   1. Endocrine disorder, unspecified  - Matthew Adkins  is being seen at a kind request of Fayrene Helper, MD. - I have reviewed his available endocrine records and clinically evaluated the patient. -His presentation is not consistent with clear adrenal dysfunction. - Based on these reviews, he did have low normal random cortisol on 1 occasion.  He will need further study to characterize his adrenal reserve.  I discussed and ordered ACTH stimulation test to be done as soon as possible.  I did not initiate any prescription for him today.   -He has normal thyroid function test, no diabetes.   - he is advised to maintain close follow up with Fayrene Helper, MD for primary care needs.   - Time spent with the patient: 45 minutes, of which >50% was spent in  counseling him about his adrenal function and the rest in obtaining information about his symptoms, reviewing his previous labs/studies ( including abstractions from other facilities),  evaluations, and treatments,  and developing a plan to confirm diagnosis and long term treatment based on the latest standards of care/guidelines; and documenting his care.  Matthew Adkins participated in the discussions, expressed understanding, and voiced agreement with the above plans.  All questions were answered to his satisfaction. he is encouraged to contact clinic should he have any questions or concerns prior to his return visit.  Follow up plan: Return in about 1 week (around 12/18/2019) for F/U with Pre-visit Labs.   Glade Lloyd, MD [facility] [address] [place], [place] [address] Phone: [phone]  Fax: [phone]      12/11/2019, 3:47 PM  This note was partially dictated with voice recognition software. Similar sounding words can be transcribed inadequately or may not  be corrected upon review.

## 2019-12-12 ENCOUNTER — Encounter: Payer: Self-pay | Admitting: "Endocrinology

## 2019-12-12 DIAGNOSIS — H3562 Retinal hemorrhage, left eye: Secondary | ICD-10-CM | POA: Diagnosis not present

## 2019-12-12 DIAGNOSIS — H35031 Hypertensive retinopathy, right eye: Secondary | ICD-10-CM | POA: Diagnosis not present

## 2019-12-12 DIAGNOSIS — H43821 Vitreomacular adhesion, right eye: Secondary | ICD-10-CM | POA: Diagnosis not present

## 2019-12-12 DIAGNOSIS — H34812 Central retinal vein occlusion, left eye, with macular edema: Secondary | ICD-10-CM | POA: Diagnosis not present

## 2019-12-18 ENCOUNTER — Ambulatory Visit: Payer: Medicare PPO | Admitting: "Endocrinology

## 2019-12-20 ENCOUNTER — Encounter (HOSPITAL_COMMUNITY)
Admission: RE | Admit: 2019-12-20 | Discharge: 2019-12-20 | Disposition: A | Discharge status: Home / Self Care | Payer: Medicare PPO | Source: Ambulatory Visit | Attending: "Endocrinology | Admitting: "Endocrinology

## 2019-12-20 ENCOUNTER — Other Ambulatory Visit: Payer: Self-pay

## 2019-12-20 ENCOUNTER — Encounter (HOSPITAL_COMMUNITY): Payer: Self-pay

## 2019-12-20 ENCOUNTER — Other Ambulatory Visit (HOSPITAL_COMMUNITY)
Admission: RE | Admit: 2019-12-20 | Discharge: 2019-12-20 | Disposition: A | Discharge status: Home / Self Care | Payer: Medicare PPO | Source: Ambulatory Visit | Attending: "Endocrinology | Admitting: "Endocrinology

## 2019-12-20 DIAGNOSIS — E349 Endocrine disorder, unspecified: Secondary | ICD-10-CM | POA: Insufficient documentation

## 2019-12-20 DIAGNOSIS — H34812 Central retinal vein occlusion, left eye, with macular edema: Secondary | ICD-10-CM | POA: Diagnosis not present

## 2019-12-20 LAB — ACTH STIMULATION, 3 TIME POINTS
Cortisol, 30 Min: 25.7 ug/dL
Cortisol, 60 Min: 27.3 ug/dL
Cortisol, Base: 10.4 ug/dL

## 2019-12-20 MED ORDER — COSYNTROPIN 0.25 MG IJ SOLR
0.2500 mg | Freq: Once | INTRAMUSCULAR | Status: AC
  Administered 2019-12-20: 09:00:00 0.25 mg via INTRAVENOUS

## 2019-12-28 ENCOUNTER — Other Ambulatory Visit: Payer: Self-pay | Admitting: Family Medicine

## 2020-01-29 ENCOUNTER — Other Ambulatory Visit: Payer: Self-pay | Admitting: Family Medicine

## 2020-02-01 DIAGNOSIS — H34832 Tributary (branch) retinal vein occlusion, left eye, with macular edema: Secondary | ICD-10-CM | POA: Diagnosis not present

## 2020-02-01 DIAGNOSIS — H01002 Unspecified blepharitis right lower eyelid: Secondary | ICD-10-CM | POA: Diagnosis not present

## 2020-02-01 DIAGNOSIS — H25813 Combined forms of age-related cataract, bilateral: Secondary | ICD-10-CM | POA: Diagnosis not present

## 2020-02-01 DIAGNOSIS — H01001 Unspecified blepharitis right upper eyelid: Secondary | ICD-10-CM | POA: Diagnosis not present

## 2020-02-09 ENCOUNTER — Telehealth: Payer: Self-pay | Admitting: *Deleted

## 2020-02-09 NOTE — Telephone Encounter (Signed)
-----   Message from Arnoldo Lenis, MD sent at 02/06/2020 12:46 PM EST ----- Heart moniotr shows some extra heart beats at times, at times can go into a fast heart rhythm called SVT which is not dangerous by can cause some palpitations at times. How are his symptoms doing?  Zandra Abts MD

## 2020-02-09 NOTE — Telephone Encounter (Signed)
Pt voiced understanding - denies any symptoms at this time  

## 2020-02-14 DIAGNOSIS — H34812 Central retinal vein occlusion, left eye, with macular edema: Secondary | ICD-10-CM | POA: Diagnosis not present

## 2020-02-14 DIAGNOSIS — H35031 Hypertensive retinopathy, right eye: Secondary | ICD-10-CM | POA: Diagnosis not present

## 2020-02-14 DIAGNOSIS — H3562 Retinal hemorrhage, left eye: Secondary | ICD-10-CM | POA: Diagnosis not present

## 2020-02-14 DIAGNOSIS — H43813 Vitreous degeneration, bilateral: Secondary | ICD-10-CM | POA: Diagnosis not present

## 2020-02-22 ENCOUNTER — Telehealth (INDEPENDENT_AMBULATORY_CARE_PROVIDER_SITE_OTHER): Payer: Medicare PPO | Admitting: Family Medicine

## 2020-02-22 ENCOUNTER — Encounter: Payer: Self-pay | Admitting: Family Medicine

## 2020-02-22 DIAGNOSIS — Z8546 Personal history of malignant neoplasm of prostate: Secondary | ICD-10-CM | POA: Diagnosis not present

## 2020-02-22 DIAGNOSIS — R7303 Prediabetes: Secondary | ICD-10-CM

## 2020-02-22 DIAGNOSIS — E785 Hyperlipidemia, unspecified: Secondary | ICD-10-CM

## 2020-02-22 DIAGNOSIS — F419 Anxiety disorder, unspecified: Secondary | ICD-10-CM

## 2020-02-22 DIAGNOSIS — Z122 Encounter for screening for malignant neoplasm of respiratory organs: Secondary | ICD-10-CM | POA: Diagnosis not present

## 2020-02-22 DIAGNOSIS — I10 Essential (primary) hypertension: Secondary | ICD-10-CM

## 2020-02-22 DIAGNOSIS — F17200 Nicotine dependence, unspecified, uncomplicated: Secondary | ICD-10-CM

## 2020-02-22 DIAGNOSIS — R7301 Impaired fasting glucose: Secondary | ICD-10-CM

## 2020-02-22 DIAGNOSIS — E559 Vitamin D deficiency, unspecified: Secondary | ICD-10-CM | POA: Diagnosis not present

## 2020-02-22 DIAGNOSIS — F17210 Nicotine dependence, cigarettes, uncomplicated: Secondary | ICD-10-CM

## 2020-02-22 DIAGNOSIS — F320 Major depressive disorder, single episode, mild: Secondary | ICD-10-CM | POA: Diagnosis not present

## 2020-02-22 MED ORDER — FLUOXETINE HCL 20 MG PO CAPS
20.0000 mg | ORAL_CAPSULE | Freq: Every day | ORAL | 5 refills | Status: DC
Start: 2020-02-22 — End: 2020-03-27

## 2020-02-22 NOTE — Patient Instructions (Addendum)
F/U in office end September, call if you need me sooner  Please get fasting lipid, Vit D and HBA1C next week  Now smoking 4 ciggs/ day, getting the gum will get you to quit, keep working on this  Please schedule screening chest scan at checkout  I will review Urology record to see when next visit is and get back in to you  I will send a message to Endocrinology re follow up if needed.  It is important that you exercise regularly at least 30 minutes 5 times a week. If you develop chest pain, have severe difficulty breathing, or feel very tired, stop exercising immediately and seek medical attention  Think about what you will eat, plan ahead. Choose " clean, green, fresh or frozen" over canned, processed or packaged foods which are more sugary, salty and fatty. 70 to 75% of food eaten should be vegetables and fruit. Three meals at set times with snacks allowed between meals, but they must be fruit or vegetables. Aim to eat over a 12 hour period , example 7 am to 7 pm, and STOP after  your last meal of the day. Drink water,generally about 64 ounces per day, no other drink is as healthy. Fruit juice is best enjoyed in a healthy way, by EATING the fruit. Thanks for choosing [facility], we consider it a privelige to serve you.

## 2020-02-22 NOTE — Progress Notes (Signed)
Virtual Visit via Telephone Note  I connected with Matthew Adkins on 02/22/20 at  3:40 PM EST by telephone and verified that I am speaking with the correct person using two identifiers.  Location: Patient: home Provider: work   I discussed the limitations, risks, security and privacy concerns of performing an evaluation and management service by telephone and the availability of in person appointments. I also discussed with the patient that there may be a patient responsible charge related to this service. The patient expressed understanding and agreed to proceed.   History of Present Illness: Reports doing well with no new concerns F/U chronic problems and address any new or current concerns. Review and update medications and allergies. Review recent lab and radiologic data . Update routine health maintainace. Review an encourage improved health habits to include nutrition, exercise and  sleep .  Denies recent fever or chills. Denies sinus pressure, nasal congestion, ear pain or sore throat. Denies chest congestion, productive cough or wheezing. Denies chest pains, palpitations and leg swelling Denies abdominal pain, nausea, vomiting,diarrhea or constipation.   Denies dysuria, frequency, hesitancy or incontinence. Denies joint pain, swelling and limitation in mobility. Denies headaches, seizures, numbness, or tingling. Denies depression, anxiety or insomnia. Denies skin break down or rash.       Observations/Objective: There were no vitals taken for this visit. Good communication with no confusion and intact memory. Alert and oriented x 3 No signs of respiratory distress during speech    Assessment and Plan:  Essential hypertension DASH diet and commitment to daily physical activity for a minimum of 30 minutes discussed and encouraged, as a part of hypertension management. The importance of attaining a healthy weight is also discussed.  BP/Weight 12/20/2019  12/11/2019 12/06/2019 11/22/2019 11/10/2019 11/08/2019 7/34/1937  Systolic BP 902 409 735 329 924 - 268  Diastolic BP 93 80 82 75 89 - 92  Wt. (Lbs) 205 205 204.4 202 - 205 200.08  BMI 25.62 25.62 25.55 25.25 - 25.62 25.01       H/O prostate cancer Needs f/u with Urology, unable to recall when last seen and has no appt , will refer back to treating MD  Dyslipidemia Hyperlipidemia:Low fat diet discussed and encouraged.   Lipid Panel  Lab Results  Component Value Date   CHOL 179 08/18/2019   HDL 51 08/18/2019   LDLCALC 117 (H) 08/18/2019   LDLDIRECT 105 08/22/2014   TRIG 58 08/18/2019   CHOLHDL 3.5 08/18/2019     Needs to reduce fat I diet Updated lab needed at/ before next visit.   NICOTINE ADDICTION Asked:confirms currently smokes cigarettes apprx 4 to 5/ day Assess: Unwilling to set a quit date, but is cutting back Advise: needs to QUIT to reduce risk of cancer, cardio and cerebrovascular disease Assist: counseled for 5 minutes and literature provided Arrange: follow up in 2 to 4 months   Prediabetes Patient educated about the importance of limiting  Carbohydrate intake , the need to commit to daily physical activity for a minimum of 30 minutes , and to commit weight loss. The fact that changes in all these areas will reduce or eliminate all together the development of diabetes is stressed.   Diabetic Labs Latest Ref Rng & Units 11/27/2019 11/10/2019 11/09/2019 11/08/2019 08/30/2019  HbA1c 4.0 - 5.6 % - - - - 5.9  Chol 100 - 199 mg/dL - - - - -  HDL >39 mg/dL - - - - -  Calc LDL 0 - 99 mg/dL - - - - -  Triglycerides 0 - 149 mg/dL - - - - -  Creatinine 0.76 - 1.27 mg/dL 1.04 0.98 0.94 1.15 -   BP/Weight 12/20/2019 12/11/2019 12/06/2019 11/22/2019 11/10/2019 11/08/2019 2/70/3500  Systolic BP 938 182 993 716 967 - 893  Diastolic BP 93 80 82 75 89 - 92  Wt. (Lbs) 205 205 204.4 202 - 205 200.08  BMI 25.62 25.62 25.55 25.25 - 25.62 25.01   No flowsheet data found.  Updated  lab needed at/ before next visit.   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 The patient was provided an opportunity to ask questions and all were answered. The patient agreed with the plan and demonstrated an understanding of the instructions.   The patient was advised to call back or seek an in-person evaluation if the symptoms worsen or if the condition fails to improve as anticipated.  I provided 24 minutes of non-face-to-face time during this encounter.   [name], MD

## 2020-02-22 NOTE — H&P (Signed)
Surgical History & Physical  Patient Name: Matthew Adkins DOB: [birth year]  Surgery: Cataract extraction with intraocular lens implant phacoemulsification; Left Eye  Surgeon: Baruch Goldmann MD Surgery Date:  03/01/2020 Pre-Op Date:  02/02/2020  HPI: A 56 Yr. old male patient Pt is present for re-evaluation of cataracts. The patient complains of difficulty when driving due to glare from headlights or sun, which began 2 years ago. Both eyes are affected. The condition's severity is worsening. The complaint is associated with blurry vision and halos. This is negatively affecting the patient's quality of life. Pt using AT's prn. The patient experiences no increase in flashes, floater, pain, redness or tearing. HPI was performed by Baruch Goldmann .  Medical History: Cataracts BRVO OS w/ mac edema Macula Degeneration Anxiety High Blood Pressure  Review of Systems Negative Allergic/Immunologic Negative Cardiovascular Negative Constitutional Negative Ear, Nose, Mouth & Throat Negative Endocrine Negative Eyes Negative Gastrointestinal Negative Genitourinary Negative Hemotologic/Lymphatic Negative Integumentary Negative Musculoskeletal Negative Neurological Negative Psychiatry Negative Respiratory  Social   Former smoker   Medication Artifical Tears,  Prozac, Norvasc, Prilosec, Fluoxetine, Amlodipine-Valsartan-HCTZ,   Sx/Procedures Knee & Foot, Prostate, Bladder,   Drug Allergies  Bee venom, Codeine,   History & Physical: Heent:  Cataract, Left eye NECK: supple without bruits LUNGS: lungs clear to auscultation CV: regular rate and rhythm Abdomen: soft and non-tender  Impression & Plan: Assessment: 1.  COMBINED FORMS AGE RELATED CATARACT; Both Eyes (H25.813) 2.  BRANCH RETINAL VEIN OCCLUSION BRVO- RETINA; Left Eye w/ macular edema (E36.6294) 3.  BLEPHARITIS; Right Upper Lid, Right Lower Lid, Left Upper Lid, Left Lower Lid (H01.001, H01.002,H01.004,H01.005) 4.   Pinguecula; Both Eyes (H11.153) 5.  ARCUS SENILIS; Both Eyes (H18.413)  Plan: 1.  Cataract accounts for the patient's decreased vision. This visual impairment is not correctable with a tolerable change in glasses or contact lenses. Cataract surgery with an implantation of a new lens should significantly improve the visual and functional status of the patient. Discussed all risks, benefits, alternatives, and potential complications. Discussed the procedures and recovery. Patient desires to have surgery. A-scan ordered and performed today for intra-ocular lens calculations. The surgery will be performed in order to improve vision for driving, reading, and for eye examinations. Recommend phacoemulsification with intra-ocular lens. Recommend Dextenza for post-operative pain and inflammation. Left Eye worse - first. Dilates well - shugarcaine by protocol. Declines toric. 2.  Under the care of Dr. Iona Hansen - will coordinate surgery plans with injections. 3.  regular lid cleaning. 4.  Observe; Artificial tears as needed for irritation. 5.  Discussed significance of finding

## 2020-02-23 ENCOUNTER — Encounter (HOSPITAL_COMMUNITY)
Admission: RE | Admit: 2020-02-23 | Discharge: 2020-02-23 | Disposition: A | Discharge status: Home / Self Care | Payer: Medicare PPO | Source: Ambulatory Visit | Attending: Ophthalmology | Admitting: Ophthalmology

## 2020-02-23 ENCOUNTER — Encounter: Payer: Self-pay | Admitting: Family Medicine

## 2020-02-23 ENCOUNTER — Other Ambulatory Visit: Payer: Self-pay

## 2020-02-23 NOTE — Assessment & Plan Note (Signed)
Patient educated about the importance of limiting  Carbohydrate intake , the need to commit to daily physical activity for a minimum of 30 minutes , and to commit weight loss. The fact that changes in all these areas will reduce or eliminate all together the development of diabetes is stressed.   Diabetic Labs Latest Ref Rng & Units 11/27/2019 11/10/2019 11/09/2019 11/08/2019 08/30/2019  HbA1c 4.0 - 5.6 % - - - - 5.9  Chol 100 - 199 mg/dL - - - - -  HDL >39 mg/dL - - - - -  Calc LDL 0 - 99 mg/dL - - - - -  Triglycerides 0 - 149 mg/dL - - - - -  Creatinine 0.76 - 1.27 mg/dL 1.04 0.98 0.94 1.15 -   BP/Weight 12/20/2019 12/11/2019 12/06/2019 11/22/2019 11/10/2019 11/08/2019 09/20/3844  Systolic BP 659 935 701 779 390 - 300  Diastolic BP 93 80 82 75 89 - 92  Wt. (Lbs) 205 205 204.4 202 - 205 200.08  BMI 25.62 25.62 25.55 25.25 - 25.62 25.01   No flowsheet data found.  Updated lab needed at/ before next visit.

## 2020-02-23 NOTE — Assessment & Plan Note (Signed)
Needs f/u with Urology, unable to recall when last seen and has no appt , will refer back to treating MD

## 2020-02-23 NOTE — Assessment & Plan Note (Signed)
DASH diet and commitment to daily physical activity for a minimum of 30 minutes discussed and encouraged, as a part of hypertension management. The importance of attaining a healthy weight is also discussed.  BP/Weight 12/20/2019 12/11/2019 12/06/2019 11/22/2019 11/10/2019 11/08/2019 3/34/3568  Systolic BP 616 837 290 211 155 - 208  Diastolic BP 93 80 82 75 89 - 92  Wt. (Lbs) 205 205 204.4 202 - 205 200.08  BMI 25.62 25.62 25.55 25.25 - 25.62 25.01

## 2020-02-23 NOTE — Assessment & Plan Note (Signed)
Hyperlipidemia:Low fat diet discussed and encouraged.   Lipid Panel  Lab Results  Component Value Date   CHOL 179 08/18/2019   HDL 51 08/18/2019   LDLCALC 117 (H) 08/18/2019   LDLDIRECT 105 08/22/2014   TRIG 58 08/18/2019   CHOLHDL 3.5 08/18/2019     Needs to reduce fat I diet Updated lab needed at/ before next visit.

## 2020-02-23 NOTE — Assessment & Plan Note (Signed)
Asked:confirms currently smokes cigarettes apprx 4 to 5/ day Assess: Unwilling to set a quit date, but is cutting back Advise: needs to QUIT to reduce risk of cancer, cardio and cerebrovascular disease Assist: counseled for 5 minutes and literature provided Arrange: follow up in 2 to 4 months

## 2020-02-23 NOTE — Assessment & Plan Note (Signed)
Controlled, no change in medication  

## 2020-02-26 DIAGNOSIS — H25812 Combined forms of age-related cataract, left eye: Secondary | ICD-10-CM | POA: Diagnosis not present

## 2020-02-28 ENCOUNTER — Other Ambulatory Visit: Payer: Self-pay

## 2020-02-28 ENCOUNTER — Other Ambulatory Visit (HOSPITAL_COMMUNITY)
Admission: RE | Admit: 2020-02-28 | Discharge: 2020-02-28 | Disposition: A | Discharge status: Home / Self Care | Payer: Medicare PPO | Source: Ambulatory Visit | Attending: Ophthalmology | Admitting: Ophthalmology

## 2020-02-28 DIAGNOSIS — Z01812 Encounter for preprocedural laboratory examination: Secondary | ICD-10-CM | POA: Insufficient documentation

## 2020-02-28 DIAGNOSIS — Z20822 Contact with and (suspected) exposure to covid-19: Secondary | ICD-10-CM | POA: Diagnosis not present

## 2020-02-28 MED ORDER — TETRACAINE HCL 0.5 % OP SOLN
OPHTHALMIC | Status: AC
  Filled 2020-02-28: qty 4

## 2020-02-29 ENCOUNTER — Other Ambulatory Visit: Payer: Self-pay | Admitting: Gastroenterology

## 2020-02-29 LAB — SARS CORONAVIRUS 2 (TAT 6-24 HRS): SARS Coronavirus 2: NEGATIVE

## 2020-03-01 ENCOUNTER — Encounter (HOSPITAL_COMMUNITY)
Admission: RE | Disposition: A | Discharge status: Home / Self Care | Payer: Self-pay | Source: Home / Self Care | Attending: Ophthalmology

## 2020-03-01 ENCOUNTER — Other Ambulatory Visit: Payer: Self-pay

## 2020-03-01 ENCOUNTER — Encounter (HOSPITAL_COMMUNITY): Payer: Self-pay | Admitting: Ophthalmology

## 2020-03-01 ENCOUNTER — Ambulatory Visit (HOSPITAL_COMMUNITY): Payer: Medicare PPO | Admitting: Anesthesiology

## 2020-03-01 ENCOUNTER — Ambulatory Visit (HOSPITAL_COMMUNITY)
Admission: RE | Admit: 2020-03-01 | Discharge: 2020-03-01 | Disposition: A | Discharge status: Home / Self Care | Payer: Medicare PPO | Attending: Ophthalmology | Admitting: Ophthalmology

## 2020-03-01 DIAGNOSIS — H0100B Unspecified blepharitis left eye, upper and lower eyelids: Secondary | ICD-10-CM | POA: Diagnosis not present

## 2020-03-01 DIAGNOSIS — H25812 Combined forms of age-related cataract, left eye: Secondary | ICD-10-CM | POA: Diagnosis not present

## 2020-03-01 DIAGNOSIS — H18413 Arcus senilis, bilateral: Secondary | ICD-10-CM | POA: Diagnosis not present

## 2020-03-01 DIAGNOSIS — Z79899 Other long term (current) drug therapy: Secondary | ICD-10-CM | POA: Diagnosis not present

## 2020-03-01 DIAGNOSIS — Z885 Allergy status to narcotic agent status: Secondary | ICD-10-CM | POA: Diagnosis not present

## 2020-03-01 DIAGNOSIS — H11153 Pinguecula, bilateral: Secondary | ICD-10-CM | POA: Insufficient documentation

## 2020-03-01 DIAGNOSIS — H34832 Tributary (branch) retinal vein occlusion, left eye, with macular edema: Secondary | ICD-10-CM | POA: Diagnosis not present

## 2020-03-01 DIAGNOSIS — H0100A Unspecified blepharitis right eye, upper and lower eyelids: Secondary | ICD-10-CM | POA: Diagnosis not present

## 2020-03-01 DIAGNOSIS — F418 Other specified anxiety disorders: Secondary | ICD-10-CM | POA: Diagnosis not present

## 2020-03-01 DIAGNOSIS — Z87891 Personal history of nicotine dependence: Secondary | ICD-10-CM | POA: Diagnosis not present

## 2020-03-01 HISTORY — PX: CATARACT EXTRACTION W/PHACO: SHX586

## 2020-03-01 SURGERY — PHACOEMULSIFICATION, CATARACT, WITH IOL INSERTION
Anesthesia: Monitor Anesthesia Care | Site: Eye | Laterality: Left

## 2020-03-01 MED ORDER — LIDOCAINE HCL 3.5 % OP GEL
1.0000 "application " | Freq: Once | OPHTHALMIC | Status: AC
  Administered 2020-03-01: 1 via OPHTHALMIC

## 2020-03-01 MED ORDER — EPINEPHRINE PF 1 MG/ML IJ SOLN
INTRAMUSCULAR | Status: AC
  Filled 2020-03-01: qty 2

## 2020-03-01 MED ORDER — NEOMYCIN-POLYMYXIN-DEXAMETH 3.5-10000-0.1 OP SUSP
OPHTHALMIC | Status: DC | PRN
  Administered 2020-03-01: 1 [drp] via OPHTHALMIC

## 2020-03-01 MED ORDER — LIDOCAINE HCL (PF) 1 % IJ SOLN
INTRAOCULAR | Status: DC | PRN
  Administered 2020-03-01: 1 mL via OPHTHALMIC

## 2020-03-01 MED ORDER — PHENYLEPHRINE HCL 2.5 % OP SOLN
1.0000 [drp] | OPHTHALMIC | Status: AC | PRN
  Administered 2020-03-01 (×3): 1 [drp] via OPHTHALMIC

## 2020-03-01 MED ORDER — BSS IO SOLN
INTRAOCULAR | Status: DC | PRN
  Administered 2020-03-01: 15 mL via INTRAOCULAR

## 2020-03-01 MED ORDER — EPINEPHRINE PF 1 MG/ML IJ SOLN
INTRAOCULAR | Status: DC | PRN
  Administered 2020-03-01: 500 mL

## 2020-03-01 MED ORDER — POVIDONE-IODINE 5 % OP SOLN
OPHTHALMIC | Status: DC | PRN
  Administered 2020-03-01: 1 via OPHTHALMIC

## 2020-03-01 MED ORDER — PROVISC 10 MG/ML IO SOLN
INTRAOCULAR | Status: DC | PRN
  Administered 2020-03-01: 0.85 mL via INTRAOCULAR

## 2020-03-01 MED ORDER — TETRACAINE HCL 0.5 % OP SOLN
1.0000 [drp] | OPHTHALMIC | Status: AC | PRN
  Administered 2020-03-01 (×3): 1 [drp] via OPHTHALMIC

## 2020-03-01 MED ORDER — SODIUM HYALURONATE 23 MG/ML IO SOLN
INTRAOCULAR | Status: DC | PRN
  Administered 2020-03-01: 0.6 mL via INTRAOCULAR

## 2020-03-01 MED ORDER — TROPICAMIDE 1 % OP SOLN
1.0000 [drp] | OPHTHALMIC | Status: AC
  Administered 2020-03-01 (×3): 1 [drp] via OPHTHALMIC

## 2020-03-01 MED ORDER — STERILE WATER FOR IRRIGATION IR SOLN
Status: DC | PRN
  Administered 2020-03-01: 250 mL

## 2020-03-01 SURGICAL SUPPLY — 16 items
CLOTH BEACON ORANGE TIMEOUT ST (SAFETY) ×2 IMPLANT
DEVICE MILOOP (MISCELLANEOUS) IMPLANT
EYE SHIELD UNIVERSAL CLEAR (GAUZE/BANDAGES/DRESSINGS) ×2 IMPLANT
GLOVE SURG UNDER POLY LF SZ6.5 (GLOVE) ×2 IMPLANT
GLOVE SURG UNDER POLY LF SZ7 (GLOVE) ×2 IMPLANT
MILOOP DEVICE (MISCELLANEOUS)
NEEDLE HYPO 18GX1.5 BLUNT FILL (NEEDLE) ×2 IMPLANT
PAD ARMBOARD 7.5X6 YLW CONV (MISCELLANEOUS) ×2 IMPLANT
RING MALYGIN (MISCELLANEOUS) IMPLANT
RING MALYGIN 7.0 (MISCELLANEOUS) IMPLANT
SYR TB 1ML LL NO SAFETY (SYRINGE) ×2 IMPLANT
TAPE SURG TRANSPORE 1 IN (GAUZE/BANDAGES/DRESSINGS) ×1 IMPLANT
TAPE SURGICAL TRANSPORE 1 IN (GAUZE/BANDAGES/DRESSINGS) ×2
Tecnis IOL (Intraocular Lens) ×2 IMPLANT
VISCOELASTIC ADDITIONAL (OPHTHALMIC RELATED) IMPLANT
WATER STERILE IRR 250ML POUR (IV SOLUTION) ×2 IMPLANT

## 2020-03-01 NOTE — Anesthesia Preprocedure Evaluation (Signed)
Anesthesia Evaluation  Patient identified by MRN, date of birth, ID band Patient awake    Reviewed: Allergy & Precautions, H&P , NPO status , Patient's Chart, lab work & pertinent test results, reviewed documented beta blocker date and time   Airway Mallampati: II  TM Distance: >3 FB Neck ROM: full    Dental no notable dental hx.    Pulmonary neg pulmonary ROS, Current Smoker,    Pulmonary exam normal breath sounds clear to auscultation       Cardiovascular Exercise Tolerance: Good hypertension, negative cardio ROS   Rhythm:regular Rate:Normal     Neuro/Psych  Headaches, PSYCHIATRIC DISORDERS Anxiety Depression    GI/Hepatic Neg liver ROS, GERD  Medicated,  Endo/Other  negative endocrine ROS  Renal/GU negative Renal ROS  negative genitourinary   Musculoskeletal   Abdominal   Peds  Hematology negative hematology ROS (+)   Anesthesia Other Findings   Reproductive/Obstetrics negative OB ROS                             Anesthesia Physical Anesthesia Plan  ASA: III  Anesthesia Plan: MAC   Post-op Pain Management:    Induction:   PONV Risk Score and Plan:   Airway Management Planned:   Additional Equipment:   Intra-op Plan:   Post-operative Plan:   Informed Consent: I have reviewed the patients History and Physical, chart, labs and discussed the procedure including the risks, benefits and alternatives for the proposed anesthesia with the patient or authorized representative who has indicated his/her understanding and acceptance.     Dental Advisory Given  Plan Discussed with: CRNA  Anesthesia Plan Comments:         Anesthesia Quick Evaluation

## 2020-03-01 NOTE — Anesthesia Postprocedure Evaluation (Signed)
Anesthesia Post Note  Patient: Matthew Adkins  Procedure(s) Performed: CATARACT EXTRACTION PHACO AND INTRAOCULAR LENS PLACEMENT (IOC) (Left Eye)  Patient location during evaluation: Phase II Anesthesia Type: MAC Level of consciousness: awake and alert and patient cooperative Pain management: pain level controlled Vital Signs Assessment: post-procedure vital signs reviewed and stable Respiratory status: spontaneous breathing and respiratory function stable Cardiovascular status: blood pressure returned to baseline Postop Assessment: no apparent nausea or vomiting Anesthetic complications: no   No complications documented.   Last Vitals:  Vitals:   03/01/20 1157  BP: (!) 151/83  Pulse: (!) 54  Resp: 16  Temp: 37 C  SpO2: 98%    Last Pain:  Vitals:   03/01/20 1157  TempSrc: Oral  PainSc: 0-No pain                 Shelina Luo, Arther Dames

## 2020-03-01 NOTE — Discharge Instructions (Addendum)
Please discharge patient when stable, will follow up today with Dr. Wrzosek at the [facility] [place] office immediately following discharge.  Leave shield in place until visit.  All paperwork with discharge instructions will be given at the office.   [facility] Address:  [address]  [place], [place] [address]  

## 2020-03-01 NOTE — Transfer of Care (Signed)
Immediate Anesthesia Transfer of Care Note  Patient: Matthew Adkins  Procedure(s) Performed: CATARACT EXTRACTION PHACO AND INTRAOCULAR LENS PLACEMENT (IOC) (Left Eye)  Patient Location: PACU  Anesthesia Type:MAC  Level of Consciousness: awake, alert , oriented and patient cooperative  Airway & Oxygen Therapy: Patient Spontanous Breathing  Post-op Assessment: Report given to RN, Post -op Vital signs reviewed and stable and Patient moving all extremities  Post vital signs: Reviewed and stable  Last Vitals:  Vitals Value Taken Time  BP    Temp    Pulse    Resp    SpO2      Last Pain:  Vitals:   03/01/20 1157  TempSrc: Oral  PainSc: 0-No pain      Patients Stated Pain Goal: 8 (34/75/83 0746)  Complications: No complications documented.

## 2020-03-01 NOTE — Interval H&P Note (Signed)
History and Physical Interval Note:  03/01/2020 12:50 PM  Matthew Adkins  has presented today for surgery, with the diagnosis of Nuclear sclerotic cataract - Left eye.  The various methods of treatment have been discussed with the patient and family. After consideration of risks, benefits and other options for treatment, the patient has consented to  Procedure(s) with comments: CATARACT EXTRACTION PHACO AND INTRAOCULAR LENS PLACEMENT ([place]) (Left) - left as a surgical intervention.  The patient's history has been reviewed, patient examined, no change in status, stable for surgery.  I have reviewed the patient's chart and labs.  Questions were answered to the patient's satisfaction.     Baruch Goldmann

## 2020-03-01 NOTE — Op Note (Signed)
Date of procedure: 03/01/20  Pre-operative diagnosis: Visually significant age-related combined cataract, Left Eye (H25.812)  Post-operative diagnosis: Visually significant age-related combined cataract, Left Eye (H25.812)  Procedure: Removal of cataract via phacoemulsification and insertion of intra-ocular lens [employer] DCB00  +20.0D into the capsular bag of the Left Eye  Attending surgeon: Gerda Diss. Frenchie Dangerfield, MD, MA  Anesthesia: MAC, Topical [place]  Complications: None  Estimated Blood Loss: <49m (minimal)  Specimens: None  Implants: As above  Indications:  Visually significant age-related cataract, Left Eye  Procedure:  The patient was seen and identified in the pre-operative area. The operative eye was identified and dilated.  The operative eye was marked.  Topical anesthesia was administered to the operative eye.     The patient was then to the operative suite and placed in the supine position.  A timeout was performed confirming the patient, procedure to be performed, and all other relevant information.   The patient's face was prepped and draped in the usual fashion for intra-ocular surgery.  A lid speculum was placed into the operative eye and the surgical microscope moved into place and focused.  An inferotemporal paracentesis was created using a 20 gauge paracentesis blade.  Shugarcaine was injected into the anterior chamber.  Viscoelastic was injected into the anterior chamber.  A temporal clear-corneal main wound incision was created using a 2.461mmicrokeratome.  A continuous curvilinear capsulorrhexis was initiated using an irrigating cystitome and completed using capsulorrhexis forceps.  Hydrodissection and hydrodeliniation were performed.  Viscoelastic was injected into the anterior chamber.  A phacoemulsification handpiece and a chopper as a second instrument were used to remove the nucleus and epinucleus. The irrigation/aspiration handpiece was used to remove  any remaining cortical material.   The capsular bag was reinflated with viscoelastic, checked, and found to be intact.  The intraocular lens was inserted into the capsular bag.  The irrigation/aspiration handpiece was used to remove any remaining viscoelastic.  The clear corneal wound and paracentesis wounds were then hydrated and checked with Weck-Cels to be watertight.  The lid-speculum was removed.  The drape was removed.  The patient's face was cleaned with a wet and dry 4x4.   Maxitrol was instilled in the eye. A clear shield was taped over the eye. The patient was taken to the post-operative care unit in good condition, having tolerated the procedure well.  Post-Op Instructions: [facility] and will receive all other orders and instructions.

## 2020-03-04 ENCOUNTER — Other Ambulatory Visit: Payer: Self-pay | Admitting: Gastroenterology

## 2020-03-04 ENCOUNTER — Encounter (HOSPITAL_COMMUNITY): Payer: Self-pay | Admitting: Ophthalmology

## 2020-03-04 NOTE — Telephone Encounter (Signed)
I am unable to refill this prescription.  He has not been seen since 2019 and needs an office visit for additional refills.  We previously sent a letter about this.  If he needs refills in the meantime, he will need to discuss with PCP.

## 2020-03-04 NOTE — Telephone Encounter (Signed)
Phoned the pt and advised that since he hasn't been seen since 2019 we are unable to fill his medication. Pt stated no one ever told him he needed a OV. Transferred to [facility] for appt date and time.

## 2020-03-05 NOTE — Telephone Encounter (Signed)
Previously refused refill. Nursing staff reached out to patient to notify him I would not be able to refill his medication as he hadn't been seen since 2019. Patient stated no one let him know he needed an OV previously. Will send in limited refills. Patient is now scheduled for OV on 3/3.   Dena, please let patient know I went ahead and sent a refill to his pharmacy but to keep upcoming OV.

## 2020-03-06 NOTE — Telephone Encounter (Signed)
Phoned and LM on pt's vm to return call regarding his medication and refills.

## 2020-03-07 ENCOUNTER — Ambulatory Visit: Payer: Medicare PPO | Admitting: Nurse Practitioner

## 2020-03-07 ENCOUNTER — Encounter: Payer: Self-pay | Admitting: Nurse Practitioner

## 2020-03-07 ENCOUNTER — Telehealth: Payer: Self-pay

## 2020-03-07 ENCOUNTER — Ambulatory Visit (INDEPENDENT_AMBULATORY_CARE_PROVIDER_SITE_OTHER): Payer: Medicare PPO | Admitting: "Endocrinology

## 2020-03-07 ENCOUNTER — Encounter: Payer: Self-pay | Admitting: "Endocrinology

## 2020-03-07 ENCOUNTER — Other Ambulatory Visit: Payer: Self-pay

## 2020-03-07 VITALS — BP 148/82 | HR 62 | Temp 96.6°F | Ht 75.0 in | Wt 200.4 lb

## 2020-03-07 VITALS — BP 120/78 | HR 56 | Ht 75.0 in | Wt 200.4 lb

## 2020-03-07 DIAGNOSIS — E785 Hyperlipidemia, unspecified: Secondary | ICD-10-CM | POA: Diagnosis not present

## 2020-03-07 DIAGNOSIS — R7301 Impaired fasting glucose: Secondary | ICD-10-CM | POA: Diagnosis not present

## 2020-03-07 DIAGNOSIS — R7303 Prediabetes: Secondary | ICD-10-CM | POA: Diagnosis not present

## 2020-03-07 DIAGNOSIS — K219 Gastro-esophageal reflux disease without esophagitis: Secondary | ICD-10-CM | POA: Diagnosis not present

## 2020-03-07 DIAGNOSIS — D126 Benign neoplasm of colon, unspecified: Secondary | ICD-10-CM | POA: Diagnosis not present

## 2020-03-07 DIAGNOSIS — E349 Endocrine disorder, unspecified: Secondary | ICD-10-CM | POA: Insufficient documentation

## 2020-03-07 DIAGNOSIS — E559 Vitamin D deficiency, unspecified: Secondary | ICD-10-CM | POA: Diagnosis not present

## 2020-03-07 MED ORDER — PEG 3350-KCL-NA BICARB-NACL 420 G PO SOLR: 4000.0000 mL | ORAL | 0 refills | Status: DC

## 2020-03-07 MED ORDER — OMEPRAZOLE 20 MG PO CPDR: 20.0000 mg | DELAYED_RELEASE_CAPSULE | Freq: Every day | ORAL | 3 refills | Status: DC

## 2020-03-07 NOTE — Telephone Encounter (Signed)
PA for TCS submitted via [employer] website. Case went to clinical review. Clinical notes uploaded. Tracking# [id number].

## 2020-03-07 NOTE — Progress Notes (Signed)
Referring Provider: Fayrene Helper, MD Primary Care Physician:  Fayrene Helper, MD Primary GI:  Dr. Abbey Chatters  Chief Complaint  Patient presents with  . Gastroesophageal Reflux    indigestion    HPI:   Matthew Adkins is a 70 y.o. male who presents for follow-up and medication refills. The patient was last seen in our office 07/13/2017 for tubular adenoma of the colon, GERD, heme positive stool. Colonoscopy up-to-date 2019 with noted mix of tubular adenoma and hyperplastic polyps and recommended repeat colonoscopy in 3 years (2022). At his last visit GERD was doing well, no overt GI complaints. Recommended he continue omeprazole indefinitely and follow-up in 1 year.  Today he states he doing okay overall. He needs a refill of his omeprazole, states it works well. Started having symptoms when he ran out of medication. Discussed that he's due for a colonoscopy. He previously had a lot of gas with abdominal discomfort but this is improved (takes simethicone). Denies abdominal pain, N/V, hematochezia, melena, fever, chills, unintentional weight loss. Denies URI or flu-like symptoms. Denies loss of sense of taste or smell. The patient has received COVID-19 vaccination(s). Denies chest pain, dyspnea, dizziness, lightheadedness, syncope, near syncope. Denies any other upper or lower GI symptoms.  Past Medical History:  Diagnosis Date  . Bladder cancer ([place]) 2000  . Depression   . Essential hypertension 01/16/2018  . Nicotine addiction   . Prostate cancer ([place]) 07/06/2014  . Seasonal allergies     Past Surgical History:  Procedure Laterality Date  . athroscopy of right knee  2002  . cancerous polyps removed from bladder  2000  . CATARACT EXTRACTION W/PHACO Left 03/01/2020   Procedure: CATARACT EXTRACTION PHACO AND INTRAOCULAR LENS PLACEMENT (IOC);  Surgeon: Baruch Goldmann, MD;  Location: [facility];  Service: Ophthalmology;  Laterality: Left;  CDE  4.96  . COLONOSCOPY  MJ 2009 pmhX: POLYPS    POLYP REMOVED BUT NOT RETRIEVED  . COLONOSCOPY  LS 2007 ARS   ? POLYPS, no path available  . COLONOSCOPY  09/26/2010   sessile poylp(2) pan-colonic diverticulosis,mild/internal hemorrhoids  . COLONOSCOPY WITH PROPOFOL N/A 02/09/2017   Procedure: COLONOSCOPY WITH PROPOFOL;  Surgeon: Danie Binder, MD;  Location: [facility];  Service: Endoscopy;  Laterality: N/A;  8:30am  . ESOPHAGOGASTRODUODENOSCOPY (EGD) WITH PROPOFOL N/A 02/09/2017   Procedure: ESOPHAGOGASTRODUODENOSCOPY (EGD) WITH PROPOFOL;  Surgeon: Danie Binder, MD;  Location: [facility];  Service: Endoscopy;  Laterality: N/A;  . FOOT SURGERY Bilateral 2004  . HERNIA REPAIR  1980's   left inguinal hernia  . LYMPHADENECTOMY Bilateral 09/03/2014   Procedure: PELVIC LYMPHADENECTOMY;  Surgeon: Raynelle Bring, MD;  Location: [facility];  Service: Urology;  Laterality: Bilateral;  . POLYPECTOMY  02/09/2017   Procedure: POLYPECTOMY;  Surgeon: Danie Binder, MD;  Location: [facility];  Service: Endoscopy;;  cecal polyp, hepatic flexure polyps x3, transverse colon polyp, descending colon polyps x2, rectal polyps x2  . PROSTATE BIOPSY  07/06/14  . ROBOT ASSISTED LAPAROSCOPIC RADICAL PROSTATECTOMY N/A 09/03/2014   Procedure: ROBOTIC ASSISTED LAPAROSCOPIC RADICAL PROSTATECTOMY LEVEL 2;  Surgeon: Raynelle Bring, MD;  Location: [facility];  Service: Urology;  Laterality: N/A;    Current Outpatient Medications  Medication Sig Dispense Refill  . acetaminophen (TYLENOL) 500 MG tablet Take 1,000 mg by mouth every 6 (six) hours as needed for mild pain.    Marland Kitchen amLODipine (NORVASC) 10 MG tablet Take 0.5 tablets (5 mg total) by mouth daily. 30 tablet 1  .  buPROPion (WELLBUTRIN SR) 150 MG 12 hr tablet Take 1 tablet (150 mg total) by mouth 2 (two) times daily. 60 tablet 5  . FLUoxetine (PROZAC) 20 MG capsule Take 1 capsule (20 mg total) by mouth daily. 30 capsule 5  . Multiple Vitamins-Minerals (ONE DAILY MENS 50+ MULTIVIT PO) Take 1 tablet by mouth daily.    Marland Kitchen  omeprazole (PRILOSEC) 20 MG capsule Take 1 capsule (20 mg total) by mouth daily. TAKE 1 CAPSULE BY MOUTH 30 MINUTES PRIOR TO BREAKFAST. 30 capsule 1  . Simethicone 80 MG TABS Take 1 tablet (80 mg total) by mouth daily as needed. (Patient taking differently: Take 1 tablet by mouth daily as needed (flatulance).) 30 tablet 2   No current facility-administered medications for this visit.    Allergies as of 03/07/2020 - Review Complete 03/07/2020  Allergen Reaction Noted  . Bee venom  09/06/2011  . Codeine Nausea And Vomiting 01/03/2009    Family History  Problem Relation Age of Onset  . Heart attack Father   . Diabetes Sister   . Hypertension Sister   . Hypertension Sister   . Aneurysm Sister        brain   . Arthritis Other        family history   . Colon cancer Neg Hx   . Colon polyps Neg Hx     Social History   Socioeconomic History  . Marital status: Married    Spouse name: Not on file  . Number of children: 2  . Years of education: Not on file  . Highest education level: Not on file  Occupational History  . Occupation: unemployed   Tobacco Use  . Smoking status: Current Every Day Smoker    Packs/day: 0.25    Years: 30.00    Pack years: 7.50    Types: Cigarettes  . Smokeless tobacco: Never Used  Vaping Use  . Vaping Use: Never used  Substance and Sexual Activity  . Alcohol use: Yes    Comment: 3 pints/month  . Drug use: Yes    Types: Marijuana    Comment: occassionally  . Sexual activity: Yes    Birth control/protection: None  Other Topics Concern  . Not on file  Social History Narrative  . Not on file   Social Determinants of Health   Financial Resource Strain: Low Risk   . Difficulty of Paying Living Expenses: Not very hard  Food Insecurity: Not on file  Transportation Needs: Not on file  Physical Activity: Not on file  Stress: No Stress Concern Present  . Feeling of Stress : Only a little  Social Connections: Moderately Integrated  . Frequency of  Communication with Friends and Family: Twice a week  . Frequency of Social Gatherings with Friends and Family: Once a week  . Attends Religious Services: 1 to 4 times per year  . Active Member of Clubs or Organizations: No  . Attends Archivist Meetings: Never  . Marital Status: Married    Subjective: Review of Systems  Constitutional: Negative for chills, fever, malaise/fatigue and weight loss.  HENT: Negative for congestion and sore throat.   Respiratory: Negative for cough and shortness of breath.   Cardiovascular: Negative for chest pain and palpitations.  Gastrointestinal: Positive for heartburn (when out of medication). Negative for abdominal pain, blood in stool, diarrhea, melena, nausea and vomiting.  Musculoskeletal: Negative for joint pain and myalgias.  Skin: Negative for rash.  Neurological: Negative for dizziness and weakness.  Endo/Heme/Allergies: Does  not bruise/bleed easily.  Psychiatric/Behavioral: Negative for depression. The patient is not nervous/anxious.   All other systems reviewed and are negative.    Objective: BP (!) 148/82   Pulse 62   Temp (!) 96.6 F (35.9 C)   Ht _0  (1.905 m)   Wt 200 lb 6.4 oz (90.9 kg)   BMI 25.05 kg/m  Physical Exam Vitals and nursing note reviewed.  Constitutional:      General: He is not in acute distress.    Appearance: Normal appearance. He is normal weight. He is not ill-appearing, toxic-appearing or diaphoretic.  HENT:     Head: Normocephalic and atraumatic.     Nose: No congestion or rhinorrhea.  Eyes:     General: No scleral icterus. Cardiovascular:     Rate and Rhythm: Normal rate and regular rhythm.     Heart sounds: Normal heart sounds.  Pulmonary:     Effort: Pulmonary effort is normal.     Breath sounds: Normal breath sounds.  Abdominal:     General: Bowel sounds are normal. There is no distension.     Palpations: Abdomen is soft. There is no hepatomegaly, splenomegaly or mass.      Tenderness: There is no abdominal tenderness. There is no guarding or rebound.     Hernia: No hernia is present.  Musculoskeletal:     Cervical back: Neck supple.  Skin:    General: Skin is warm and dry.     Coloration: Skin is not jaundiced.     Findings: No bruising or rash.  Neurological:     General: No focal deficit present.     Mental Status: He is alert and oriented to person, place, and time. Mental status is at baseline.  Psychiatric:        Mood and Affect: Mood normal.        Behavior: Behavior normal.        Thought Content: Thought content normal.      Assessment:  Very pleasant 70 year old male presents for medication refills on his omeprazole.  He is also noted that he is due for colonoscopy based on his previous exam completed 2018.  He has been having some GERD flares since he ran out of his medication, although omeprazole typically controls his symptoms well.  Last colonoscopy in 2018 with tubular adenoma polyps and recommended 3-year repeat in 2022, currently due.  At this point I will refill his omeprazole and we will proceed with colonoscopy as previously recommended.   Proceed with TCS on propofol/MAC with Dr. Abbey Chatters on propofol/MAC in near future: the risks, benefits, and alternatives have been discussed with the patient in detail. The patient states understanding and desires to proceed.  ASA II   Plan: 1. Colonoscopy as noted above 2. Refill omeprazole 20 mg daily 3. Call for any worsening or severe symptoms 4. Follow-up as needed or based on post procedure recommendations.    Thank you for allowing [place] to participate in the care of Erasmus A Rochele Pages, DNP, AGNP-C Adult & Gerontological Nurse Practitioner [facility]   03/07/2020 8:26 AM   Disclaimer: This note was dictated with voice recognition software. Similar sounding words can inadvertently be transcribed and may not be corrected upon review.

## 2020-03-07 NOTE — Progress Notes (Signed)
03/07/2020, 2:44 PM  Endocrinology follow-up note   Subjective:    Patient ID: Matthew Adkins, male    DOB: 1950-05-20, PCP Matthew Helper, MD   Past Medical History:  Diagnosis Date  . Bladder cancer ([place]) 2000  . Depression   . Essential hypertension 01/16/2018  . Nicotine addiction   . Prostate cancer ([place]) 07/06/2014  . Seasonal allergies    Past Surgical History:  Procedure Laterality Date  . athroscopy of right knee  2002  . cancerous polyps removed from bladder  2000  . CATARACT EXTRACTION W/PHACO Left 03/01/2020   Procedure: CATARACT EXTRACTION PHACO AND INTRAOCULAR LENS PLACEMENT (IOC);  Surgeon: Baruch Goldmann, MD;  Location: [facility];  Service: Ophthalmology;  Laterality: Left;  CDE  4.96  . COLONOSCOPY  MJ 2009 pmhX: POLYPS   POLYP REMOVED BUT NOT RETRIEVED  . COLONOSCOPY  LS 2007 ARS   ? POLYPS, no path available  . COLONOSCOPY  09/26/2010   sessile poylp(2) pan-colonic diverticulosis,mild/internal hemorrhoids  . COLONOSCOPY WITH PROPOFOL N/A 02/09/2017   Procedure: COLONOSCOPY WITH PROPOFOL;  Surgeon: Danie Binder, MD;  Location: [facility];  Service: Endoscopy;  Laterality: N/A;  8:30am  . ESOPHAGOGASTRODUODENOSCOPY (EGD) WITH PROPOFOL N/A 02/09/2017   Procedure: ESOPHAGOGASTRODUODENOSCOPY (EGD) WITH PROPOFOL;  Surgeon: Danie Binder, MD;  Location: [facility];  Service: Endoscopy;  Laterality: N/A;  . FOOT SURGERY Bilateral 2004  . HERNIA REPAIR  1980's   left inguinal hernia  . LYMPHADENECTOMY Bilateral 09/03/2014   Procedure: PELVIC LYMPHADENECTOMY;  Surgeon: Raynelle Bring, MD;  Location: [facility];  Service: Urology;  Laterality: Bilateral;  . POLYPECTOMY  02/09/2017   Procedure: POLYPECTOMY;  Surgeon: Danie Binder, MD;  Location: [facility];  Service: Endoscopy;;  cecal polyp, hepatic flexure polyps x3, transverse colon polyp, descending colon polyps x2, rectal polyps x2  . PROSTATE BIOPSY   07/06/14  . ROBOT ASSISTED LAPAROSCOPIC RADICAL PROSTATECTOMY N/A 09/03/2014   Procedure: ROBOTIC ASSISTED LAPAROSCOPIC RADICAL PROSTATECTOMY LEVEL 2;  Surgeon: Raynelle Bring, MD;  Location: [facility];  Service: Urology;  Laterality: N/A;   Social History   Socioeconomic History  . Marital status: Married    Spouse name: Not on file  . Number of children: 2  . Years of education: Not on file  . Highest education level: Not on file  Occupational History  . Occupation: unemployed   Tobacco Use  . Smoking status: Current Every Day Smoker    Packs/day: 0.25    Years: 30.00    Pack years: 7.50    Types: Cigarettes  . Smokeless tobacco: Never Used  Vaping Use  . Vaping Use: Never used  Substance and Sexual Activity  . Alcohol use: Yes    Comment: 3 pints/month  . Drug use: Yes    Types: Marijuana    Comment: occassionally  . Sexual activity: Yes    Birth control/protection: None  Other Topics Concern  . Not on file  Social History Narrative  . Not on file   Social Determinants of Health   Financial Resource Strain: Low Risk   . Difficulty of Paying Living Expenses: Not very hard  Food Insecurity: Not on file  Transportation Needs: Not on  file  Physical Activity: Not on file  Stress: No Stress Concern Present  . Feeling of Stress : Only a little  Social Connections: Moderately Integrated  . Frequency of Communication with Friends and Family: Twice a week  . Frequency of Social Gatherings with Friends and Family: Once a week  . Attends Religious Services: 1 to 4 times per year  . Active Member of Clubs or Organizations: No  . Attends Archivist Meetings: Never  . Marital Status: Married   Family History  Problem Relation Age of Onset  . Heart attack Father   . Diabetes Sister   . Hypertension Sister   . Hypertension Sister   . Aneurysm Sister        brain   . Arthritis Other        family history   . Colon cancer Neg Hx   . Colon polyps Neg Hx     Outpatient Encounter Medications as of 03/07/2020  Medication Sig  . acetaminophen (TYLENOL) 500 MG tablet Take 1,000 mg by mouth every 6 (six) hours as needed for mild pain.  Marland Kitchen amLODipine (NORVASC) 10 MG tablet Take 0.5 tablets (5 mg total) by mouth daily.  Marland Kitchen buPROPion (WELLBUTRIN SR) 150 MG 12 hr tablet Take 1 tablet (150 mg total) by mouth 2 (two) times daily.  Marland Kitchen FLUoxetine (PROZAC) 20 MG capsule Take 1 capsule (20 mg total) by mouth daily.  . Multiple Vitamins-Minerals (ONE DAILY MENS 50+ MULTIVIT PO) Take 1 tablet by mouth daily.  Marland Kitchen omeprazole (PRILOSEC) 20 MG capsule Take 1 capsule (20 mg total) by mouth daily. TAKE 1 CAPSULE BY MOUTH 30 MINUTES PRIOR TO BREAKFAST.  Marland Kitchen Simethicone 80 MG TABS Take 1 tablet (80 mg total) by mouth daily as needed. (Patient taking differently: Take 1 tablet by mouth daily as needed (flatulance).)   No facility-administered encounter medications on file as of 03/07/2020.   ALLERGIES: Allergies  Allergen Reactions  . Bee Venom   . Codeine Nausea And Vomiting    VACCINATION STATUS: Immunization History  Administered Date(s) Administered  . Fluad Quad(high Dose 65+) 10/03/2018  . Influenza Split 09/20/2010, 10/06/2011  . Influenza Whole 10/02/2008, 09/19/2009  . Influenza, High Dose Seasonal PF 11/05/2017, 11/07/2019  . Influenza,inj,Quad PF,6+ Mos 09/19/2014, 10/23/2015, 10/07/2016  . Moderna SARS-COV2 Booster Vaccination 11/23/2019  . Moderna Sars-Covid-2 Vaccination 02/12/2019, 03/15/2019  . Pneumococcal Conjugate-13 05/30/2014  . Pneumococcal Polysaccharide-23 10/23/2015  . Td 10/21/2009  . Zoster 06/25/2010    HPI Matthew Adkins is 70 y.o. male who presents today with a medical history as above. he was seen in consultation previously for hypercortisolemia.  PMD: Matthew Helper, MD.  -  He denies any prior history of adrenal, thyroid, pituitary dysfunction.  He denies any exposure to heavy dose steroids.  He has consistent and steady  body weight.  He denies any abdominal injury or surgery to the adrenals. He was found to have random cortisol of 7.3 on November 10, 2019.  Repeat labs on November 27, 2019 showed cortisol improving to 13 along with ACTH of 68.8. -He was sent to lab for ACTH stimulation test.  This test reveals adequate adrenal response. He denies dizziness, lightheadedness, nausea/vomiting.  He is a chronic smoker.   He has well-controlled hypertension on amlodipine 5 mg p.o. daily.   -He denies any prior history of diabetes, A1c in August was 5.9%.  Review of Systems  Constitutional: no recent weight gain/loss, no fatigue, no subjective hyperthermia, no subjective hypothermia  Objective:    Vitals with BMI 03/07/2020 03/07/2020 03/01/2020  Height 6\' 3"  6\' 3"  -  Weight 200 lbs 6 oz 200 lbs 6 oz -  BMI 16.10 96.04 -  Systolic 540 981 191  Diastolic 78 82 82  Pulse 56 62 56    BP 120/78   Pulse (!) 56   Ht 6\' 3"  (1.905 m)   Wt 200 lb 6.4 oz (90.9 kg)   BMI 25.05 kg/m   Wt Readings from Last 3 Encounters:  03/07/20 200 lb 6.4 oz (90.9 kg)  03/07/20 200 lb 6.4 oz (90.9 kg)  12/20/19 205 lb (93 kg)    Physical Exam  Constitutional:  Body mass index is 25.05 kg/m.,  not in acute distress, normal state of mind   CMP ( most recent) CMP     Component Value Date/Time   NA 138 11/27/2019 0842   K 4.0 11/27/2019 0842   CL 100 11/27/2019 0842   CO2 23 11/27/2019 0842   GLUCOSE 115 (H) 11/27/2019 0842   GLUCOSE 108 (H) 11/10/2019 0719   BUN 16 11/27/2019 0842   CREATININE 1.04 11/27/2019 0842   CREATININE 0.92 05/29/2015 1155   CALCIUM 8.9 11/27/2019 0842   PROT 6.9 11/27/2019 0842   ALBUMIN 4.3 11/27/2019 0842   AST 16 11/27/2019 0842   ALT 12 11/27/2019 0842   ALKPHOS 74 11/27/2019 0842   BILITOT 0.5 11/27/2019 0842   GFRNONAA 72 11/27/2019 0842   GFRNONAA >60 11/10/2019 0719   GFRNONAA 85 08/22/2014 1020   GFRAA 83 11/27/2019 0842   GFRAA >89 08/22/2014 1020     Diabetic Labs  (most recent): Lab Results  Component Value Date   HGBA1C 5.9 (A) 08/30/2019   HGBA1C 5.9 08/30/2019   HGBA1C 5.9 08/30/2019   HGBA1C 5.9 08/30/2019     Lipid Panel ( most recent) Lipid Panel     Component Value Date/Time   CHOL 179 08/18/2019 1333   TRIG 58 08/18/2019 1333   HDL 51 08/18/2019 1333   CHOLHDL 3.5 08/18/2019 1333   CHOLHDL 3.6 01/21/2018 1121   VLDL 9 01/21/2018 1121   LDLCALC 117 (H) 08/18/2019 1333   LDLDIRECT 105 08/22/2014 1020   LABVLDL 11 08/18/2019 1333      Lab Results  Component Value Date   TSH 0.933 08/18/2019   TSH 1.530 07/02/2017   TSH 0.778 06/30/2016   TSH 1.06 05/29/2015   TSH 1.463 05/19/2014   TSH 0.865 01/31/2013   TSH 0.942 09/11/2010   TSH 0.803 06/25/2010   TSH 1.391 10/22/2009   TSH 2.168 08/31/2008        Recent Results (from the past 2160 hour(s))  ACTH stimulation, 3 time points     Status: None   Collection Time: 12/20/19 11:28 AM  Result Value Ref Range   Cortisol, Base 10.4 ug/dL    Comment: NO NORMAL RANGE ESTABLISHED FOR THIS TEST   Cortisol, 30 Min 25.7 ug/dL   Cortisol, 60 Min 27.3 ug/dL    Comment: Performed at [facility], [place] [address], [place], [place] [address]  SARS CORONAVIRUS 2 (TAT 6-24 HRS) Nasopharyngeal Nasopharyngeal Swab     Status: None   Collection Time: 02/28/20  1:45 PM   Specimen: Nasopharyngeal Swab  Result Value Ref Range   SARS Coronavirus 2 NEGATIVE NEGATIVE    Comment: (NOTE) SARS-CoV-2 target nucleic acids are NOT DETECTED.  The SARS-CoV-2 RNA is generally detectable in upper and lower respiratory specimens during the acute phase  of infection. Negative results do not preclude SARS-CoV-2 infection, do not rule out co-infections with other pathogens, and should not be used as the sole basis for treatment or other patient management decisions. Negative results must be combined with clinical observations, patient history, and epidemiological information. The expected result is  Negative.  Fact Sheet for Patients: SugarRoll.be  Fact Sheet for Healthcare Providers: https://www.woods-mathews.com/  This test is not yet approved or cleared by the [place] FDA and  has been authorized for detection and/or diagnosis of SARS-CoV-2 by FDA under an Emergency Use Authorization (EUA). This EUA will remain  in effect (meaning this test can be used) for the duration of the COVID-19 declaration under Se ction 564(b)(1) of the Act, 21 U.S.C. section 360bbb-3(b)(1), unless the authorization is terminated or revoked sooner.  Performed at [facility], [place] [address], [place], [place] [address]      Assessment & Plan:   1.  Hypercortisolemia-resolved 2.  Prediabetes  - I have reviewed his no labs and previous endocrine evaluations with him.    His recent ACTH stimulation test reveals appropriate adrenal response and rules out adrenal insufficiency.  He will not need intervention with steroids. -He has normal thyroid function. -He will have several A1c measurements between 5.9-6.4% consistent with prediabetes.  He will not need diabetes medication intervention at this time. - he acknowledges that there is a room for improvement in his food and drink choices. - Suggestion is made for him to avoid simple carbohydrates  from his diet including Cakes, Sweet Desserts, Ice Cream, Soda (diet and regular), Sweet Tea, Candies, Chips, Cookies, Store Bought Juices, Alcohol in Excess of  1-2 drinks a day, Artificial Sweeteners,  Coffee Creamer, and "Sugar-free" Products, Lemonade. This will help patient to have more stable blood glucose profile and potentially avoid unintended weight gain.   - he is advised to maintain close follow up with Matthew Helper, MD for primary care needs.     - Time spent on this patient care encounter:  30 minutes of which 50% was spent in  counseling and the rest reviewing  his current and  previous  labs / studies and medications  doses and developing a plan for long term care, and documenting this care. Matthew Adkins  participated in the discussions, expressed understanding, and voiced agreement with the above plans.  All questions were answered to his satisfaction. he is encouraged to contact clinic should he have any questions or concerns prior to his return visit.   Follow up plan: Return if symptoms worsen or fail to improve.   Glade Lloyd, MD [facility] [address] [place], [place] [address] Phone: [phone]  Fax: [phone]     03/07/2020, 2:44 PM  This note was partially dictated with voice recognition software. Similar sounding words can be transcribed inadequately or may not  be corrected upon review.

## 2020-03-07 NOTE — Telephone Encounter (Signed)
Pt showed for appt this am.

## 2020-03-07 NOTE — Progress Notes (Signed)
Cc'ed to pcp °

## 2020-03-07 NOTE — Patient Instructions (Signed)
Your health issues we discussed today were:   GERD (reflux/heartburn): 1. I am glad Prilosec (omeprazole) is working well for your symptoms 2. I have sent a refill to your pharmacy for omeprazole, 90-day supply with 3 refills that will last 2 years 3. Call us if you have any worsening or severe symptoms  Need for colonoscopy: 1. As we discussed, you are currently due for colonoscopy 2. We will schedule this for you and further recommendations will follow 3. Call us if you have any concerning symptoms  Overall I recommend:  1. Continue other current medications 2. Return for follow-up as needed or based on post procedure recommendations 3. Call us if he has any questions or concerns   ---------------------------------------------------------------  I am glad you have gotten your COVID-19 vaccination!  Even though you are fully vaccinated you should continue to follow CDC and state/local guidelines.  ---------------------------------------------------------------   At [facility] we value your feedback. You may receive a survey about your visit today. Please share your experience as we strive to create trusting relationships with our patients to provide genuine, compassionate, quality care.  We appreciate your understanding and patience as we review any laboratory studies, imaging, and other diagnostic tests that are ordered as we care for you. Our office policy is 5 business days for review of these results, and any emergent or urgent results are addressed in a timely manner for your best interest. If you do not hear from our office in 1 week, please contact us.   We also encourage the use of MyChart, which contains your medical information for your review as well. If you are not enrolled in this feature, an access code is on this after visit summary for your convenience. Thank you for allowing [place] to be involved in your care.  It was great to see you today!  I hope you  have a great spring!!

## 2020-03-07 NOTE — Telephone Encounter (Signed)
TCS approved. [employer]# [id number], valid 04/26/20-05/26/20.

## 2020-03-08 LAB — LIPID PANEL
Chol/HDL Ratio: 3.2 ratio (ref 0.0–5.0)
Cholesterol, Total: 177 mg/dL (ref 100–199)
HDL: 56 mg/dL (ref >39)
LDL Chol Calc (NIH): 110 mg/dL — ABNORMAL HIGH (ref 0–99)
Triglycerides: 54 mg/dL (ref 0–149)
VLDL Cholesterol Cal: 11 mg/dL (ref 5–40)

## 2020-03-08 LAB — HEMOGLOBIN A1C
Est. average glucose Bld gHb Est-mCnc: 128 mg/dL
Hgb A1c MFr Bld: 6.1 % — ABNORMAL HIGH (ref 4.8–5.6)

## 2020-03-08 LAB — VITAMIN D 25 HYDROXY (VIT D DEFICIENCY, FRACTURES): Vit D, 25-Hydroxy: 35.3 ng/mL (ref 30.0–100.0)

## 2020-03-13 NOTE — H&P (Signed)
Surgical History & Physical  Patient Name: Matthew Adkins DOB: Jul 26, 1950  Surgery: Cataract extraction with intraocular lens implant phacoemulsification; Right Eye  Surgeon: Baruch Goldmann MD Surgery Date:  03/22/2020 Pre-Op Date:  03/07/2020  HPI: A 54 Yr. old male patient The patient is returning after cataract surgery. The left eye is affected. Status post cataract surgery, which began 1 week ago: Since the last visit, the affected area is doing well. The patient's vision is improved and stable. Patient is following medication instructions. Omni TID OS. The patient experiences no increase in flashes, floater, shadow, curtain or veil. The patient complains of difficulty when driving due to glare from headlights or sun, which began 2 years ago. The right eye is affected. The condition's severity is worsening. The complaint is associated with blurry vision and halos. This is negatively affecting the patient's quality of life. HPI Completed by Dr. Baruch Goldmann  Medical History: Cataracts BRVO OS w/ mac edema Anxiety High Blood Pressure  Review of Systems Negative Allergic/Immunologic Negative Cardiovascular Negative Constitutional Negative Ear, Nose, Mouth & Throat Negative Endocrine Negative Eyes Negative Gastrointestinal Negative Genitourinary Negative Hemotologic/Lymphatic Negative Integumentary Negative Musculoskeletal Negative Neurological Negative Psychiatry Negative Respiratory  Social   Former smoker    Medication Artifical Tears, Prednisolone-gatiflox-bromfenac,  Prozac, Norvasc, Prilosec, Fluoxetine, Amlodipine-Valsartan-HCTZ,   Sx/Procedures Phaco c IOL OS,  Knee & Foot, Prostate, Bladder,   Drug Allergies  Bee venom, Codeine,   History & Physical: Heent:  Cataract, Right eye NECK: supple without bruits LUNGS: lungs clear to auscultation CV: regular rate and rhythm Abdomen: soft and non-tender  Impression & Plan: Assessment: 1.  CATARACT EXTRACTION  STATUS; Left Eye (Z98.42) 2.  INTRAOCULAR LENS IOL ; Left Eye (Z96.1) 3.  COMBINED FORMS AGE RELATED CATARACT; Right Eye (H25.811) 4.  Presbyopia ; Both Eyes (H52.4)  Plan: 1.  1 week after cataract surgery. Doing well with improved vision and normal eye pressure. Call with any problems or concerns. Continue Pred-Moxi-Brom 2x/day for 3 more weeks. 2.  Doing well since surgery Continue Post-op medications 3.  Declines toric. Right Eye. Surgery required to correct imbalance of vision. Dilates well - shugarcaine by protocol. Cataract accounts for the patient's decreased vision. This visual impairment is not correctable with a tolerable change in glasses or contact lenses. Cataract surgery with an implantation of a new lens should significantly improve the visual and functional status of the patient. Discussed all risks, benefits, alternatives, and potential complications. Discussed the procedures and recovery. Patient desires to have surgery. A-scan ordered and performed today for intra-ocular lens calculations. The surgery will be performed in order to improve vision for driving, reading, and for eye examinations. Recommend phacoemulsification with intra-ocular lens. Recommend Dextenza for post-operative pain and inflammation.

## 2020-03-15 ENCOUNTER — Telehealth: Payer: Medicare PPO | Admitting: Cardiology

## 2020-03-15 ENCOUNTER — Other Ambulatory Visit: Payer: Self-pay

## 2020-03-18 DIAGNOSIS — H25811 Combined forms of age-related cataract, right eye: Secondary | ICD-10-CM | POA: Diagnosis not present

## 2020-03-19 ENCOUNTER — Other Ambulatory Visit: Payer: Self-pay

## 2020-03-19 ENCOUNTER — Encounter (HOSPITAL_COMMUNITY)
Admit: 2020-03-19 | Discharge: 2020-03-19 | Disposition: A | Discharge status: Home / Self Care | Payer: Medicare PPO | Attending: Ophthalmology | Admitting: Ophthalmology

## 2020-03-19 ENCOUNTER — Encounter (HOSPITAL_COMMUNITY): Payer: Self-pay

## 2020-03-20 ENCOUNTER — Other Ambulatory Visit (HOSPITAL_COMMUNITY)
Admission: RE | Admit: 2020-03-20 | Discharge: 2020-03-20 | Disposition: A | Discharge status: Home / Self Care | Payer: Medicare PPO | Source: Ambulatory Visit | Attending: Ophthalmology | Admitting: Ophthalmology

## 2020-03-20 ENCOUNTER — Other Ambulatory Visit: Payer: Self-pay

## 2020-03-20 DIAGNOSIS — Z01812 Encounter for preprocedural laboratory examination: Secondary | ICD-10-CM | POA: Insufficient documentation

## 2020-03-20 DIAGNOSIS — Z20822 Contact with and (suspected) exposure to covid-19: Secondary | ICD-10-CM | POA: Insufficient documentation

## 2020-03-20 LAB — SARS CORONAVIRUS 2 (TAT 6-24 HRS): SARS Coronavirus 2: NEGATIVE

## 2020-03-22 ENCOUNTER — Encounter (HOSPITAL_COMMUNITY)
Admission: RE | Disposition: A | Discharge status: Home / Self Care | Payer: Self-pay | Source: Home / Self Care | Attending: Ophthalmology

## 2020-03-22 ENCOUNTER — Ambulatory Visit (HOSPITAL_COMMUNITY): Payer: Medicare PPO | Admitting: Anesthesiology

## 2020-03-22 ENCOUNTER — Ambulatory Visit (HOSPITAL_COMMUNITY)
Admission: RE | Admit: 2020-03-22 | Discharge: 2020-03-22 | Disposition: A | Discharge status: Home / Self Care | Payer: Medicare PPO | Attending: Ophthalmology | Admitting: Ophthalmology

## 2020-03-22 ENCOUNTER — Encounter (HOSPITAL_COMMUNITY): Payer: Self-pay | Admitting: Ophthalmology

## 2020-03-22 DIAGNOSIS — Z87891 Personal history of nicotine dependence: Secondary | ICD-10-CM | POA: Insufficient documentation

## 2020-03-22 DIAGNOSIS — Z9842 Cataract extraction status, left eye: Secondary | ICD-10-CM | POA: Diagnosis not present

## 2020-03-22 DIAGNOSIS — H524 Presbyopia: Secondary | ICD-10-CM | POA: Insufficient documentation

## 2020-03-22 DIAGNOSIS — H25811 Combined forms of age-related cataract, right eye: Secondary | ICD-10-CM | POA: Diagnosis not present

## 2020-03-22 DIAGNOSIS — Z885 Allergy status to narcotic agent status: Secondary | ICD-10-CM | POA: Diagnosis not present

## 2020-03-22 DIAGNOSIS — F418 Other specified anxiety disorders: Secondary | ICD-10-CM | POA: Diagnosis not present

## 2020-03-22 DIAGNOSIS — Z961 Presence of intraocular lens: Secondary | ICD-10-CM | POA: Diagnosis not present

## 2020-03-22 HISTORY — PX: CATARACT EXTRACTION W/PHACO: SHX586

## 2020-03-22 SURGERY — PHACOEMULSIFICATION, CATARACT, WITH IOL INSERTION
Anesthesia: Monitor Anesthesia Care | Site: Eye | Laterality: Right

## 2020-03-22 MED ORDER — EPINEPHRINE PF 1 MG/ML IJ SOLN
INTRAMUSCULAR | Status: AC
  Filled 2020-03-22: qty 2

## 2020-03-22 MED ORDER — NEOMYCIN-POLYMYXIN-DEXAMETH 3.5-10000-0.1 OP SUSP
OPHTHALMIC | Status: DC | PRN
  Administered 2020-03-22: 1 [drp] via OPHTHALMIC

## 2020-03-22 MED ORDER — SODIUM HYALURONATE 23 MG/ML IO SOLN
INTRAOCULAR | Status: DC | PRN
  Administered 2020-03-22: 0.6 mL via INTRAOCULAR

## 2020-03-22 MED ORDER — EPINEPHRINE PF 1 MG/ML IJ SOLN
INTRAOCULAR | Status: DC | PRN
  Administered 2020-03-22: 500 mL

## 2020-03-22 MED ORDER — STERILE WATER FOR IRRIGATION IR SOLN
Status: DC | PRN
  Administered 2020-03-22: 250 mL

## 2020-03-22 MED ORDER — LIDOCAINE HCL (PF) 1 % IJ SOLN
INTRAOCULAR | Status: DC | PRN
  Administered 2020-03-22: 1 mL via OPHTHALMIC

## 2020-03-22 MED ORDER — PROVISC 10 MG/ML IO SOLN
INTRAOCULAR | Status: DC | PRN
  Administered 2020-03-22: 0.85 mL via INTRAOCULAR

## 2020-03-22 MED ORDER — TROPICAMIDE 1 % OP SOLN
1.0000 [drp] | OPHTHALMIC | Status: AC
  Administered 2020-03-22 (×3): 1 [drp] via OPHTHALMIC

## 2020-03-22 MED ORDER — PHENYLEPHRINE HCL 2.5 % OP SOLN
1.0000 [drp] | OPHTHALMIC | Status: AC | PRN
  Administered 2020-03-22 (×3): 1 [drp] via OPHTHALMIC

## 2020-03-22 MED ORDER — LIDOCAINE HCL 3.5 % OP GEL
1.0000 "application " | Freq: Once | OPHTHALMIC | Status: AC
  Administered 2020-03-22: 1 via OPHTHALMIC

## 2020-03-22 MED ORDER — TETRACAINE HCL 0.5 % OP SOLN
1.0000 [drp] | OPHTHALMIC | Status: AC | PRN
  Administered 2020-03-22 (×3): 1 [drp] via OPHTHALMIC

## 2020-03-22 MED ORDER — POVIDONE-IODINE 5 % OP SOLN
OPHTHALMIC | Status: DC | PRN
  Administered 2020-03-22: 1 via OPHTHALMIC

## 2020-03-22 MED ORDER — BSS IO SOLN
INTRAOCULAR | Status: DC | PRN
  Administered 2020-03-22: 15 mL via INTRAOCULAR

## 2020-03-22 MED ORDER — MIDAZOLAM HCL 2 MG/2ML IJ SOLN
INTRAMUSCULAR | Status: AC
  Filled 2020-03-22: qty 2

## 2020-03-22 SURGICAL SUPPLY — 16 items
CLOTH BEACON ORANGE TIMEOUT ST (SAFETY) ×2 IMPLANT
DEVICE MILOOP (MISCELLANEOUS) IMPLANT
EYE SHIELD UNIVERSAL CLEAR (GAUZE/BANDAGES/DRESSINGS) ×2 IMPLANT
GLOVE SURG UNDER POLY LF SZ6.5 (GLOVE) ×2 IMPLANT
GLOVE SURG UNDER POLY LF SZ7 (GLOVE) ×2 IMPLANT
MILOOP DEVICE (MISCELLANEOUS)
NEEDLE HYPO 18GX1.5 BLUNT FILL (NEEDLE) ×2 IMPLANT
PAD ARMBOARD 7.5X6 YLW CONV (MISCELLANEOUS) ×2 IMPLANT
RING MALYGIN (MISCELLANEOUS) IMPLANT
RING MALYGIN 7.0 (MISCELLANEOUS) IMPLANT
SYR TB 1ML LL NO SAFETY (SYRINGE) ×2 IMPLANT
TAPE SURG TRANSPORE 1 IN (GAUZE/BANDAGES/DRESSINGS) ×1 IMPLANT
TAPE SURGICAL TRANSPORE 1 IN (GAUZE/BANDAGES/DRESSINGS) ×2
TECNIS 1-PIECE IOL (Intraocular Lens) ×2 IMPLANT
VISCOELASTIC ADDITIONAL (OPHTHALMIC RELATED) IMPLANT
WATER STERILE IRR 250ML POUR (IV SOLUTION) ×2 IMPLANT

## 2020-03-22 NOTE — Anesthesia Preprocedure Evaluation (Signed)
Anesthesia Evaluation  Patient identified by MRN, date of birth, ID band Patient awake    Reviewed: Allergy & Precautions, NPO status , Patient's Chart, lab work & pertinent test results  History of Anesthesia Complications Negative for: history of anesthetic complications  Airway Mallampati: II  TM Distance: >3 FB Neck ROM: Full    Dental  (+) Dental Advisory Given, Missing   Pulmonary Current Smoker and Patient abstained from smoking.,    Pulmonary exam normal breath sounds clear to auscultation       Cardiovascular Exercise Tolerance: Good hypertension, Pt. on medications Normal cardiovascular exam Rhythm:Regular Rate:Normal     Neuro/Psych  Headaches, PSYCHIATRIC DISORDERS Anxiety Depression    GI/Hepatic GERD  Medicated and Controlled,(+)     substance abuse  marijuana use,   Endo/Other  negative endocrine ROS  Renal/GU negative Renal ROS Bladder dysfunction (bladder cancer and prostate cancer)      Musculoskeletal  (+) Arthritis , Osteoarthritis,    Abdominal   Peds  Hematology negative hematology ROS (+)   Anesthesia Other Findings   Reproductive/Obstetrics                             Anesthesia Physical Anesthesia Plan  ASA: II  Anesthesia Plan: MAC   Post-op Pain Management:    Induction:   PONV Risk Score and Plan:   Airway Management Planned: Nasal Cannula and Natural Airway  Additional Equipment:   Intra-op Plan:   Post-operative Plan:   Informed Consent: I have reviewed the patients History and Physical, chart, labs and discussed the procedure including the risks, benefits and alternatives for the proposed anesthesia with the patient or authorized representative who has indicated his/her understanding and acceptance.     Dental advisory given  Plan Discussed with: Surgeon and CRNA  Anesthesia Plan Comments:         Anesthesia Quick  Evaluation

## 2020-03-22 NOTE — Interval H&P Note (Signed)
History and Physical Interval Note:  03/22/2020 9:18 AM  Matthew Adkins  has presented today for surgery, with the diagnosis of Nuclear sclerotic cataract - Right eye.  The various methods of treatment have been discussed with the patient and family. After consideration of risks, benefits and other options for treatment, the patient has consented to  Procedure(s) with comments: CATARACT EXTRACTION PHACO AND INTRAOCULAR LENS PLACEMENT (IOC) (Right) - right as a surgical intervention.  The patient's history has been reviewed, patient examined, no change in status, stable for surgery.  I have reviewed the patient's chart and labs.  Questions were answered to the patient's satisfaction.     Baruch Goldmann

## 2020-03-22 NOTE — Anesthesia Postprocedure Evaluation (Signed)
Anesthesia Post Note  Patient: Matthew Adkins  Procedure(s) Performed: CATARACT EXTRACTION PHACO AND INTRAOCULAR LENS PLACEMENT RIGHT EYE (Right Eye)  Patient location during evaluation: Short Stay Anesthesia Type: MAC Level of consciousness: awake and alert and patient cooperative Pain management: satisfactory to patient Vital Signs Assessment: post-procedure vital signs reviewed and stable Respiratory status: spontaneous breathing Cardiovascular status: stable Postop Assessment: no apparent nausea or vomiting Anesthetic complications: no   No complications documented.   Last Vitals:  Vitals:   03/22/20 0852 03/22/20 0946  BP: (!) 157/82 (!) 149/82  Pulse: (!) 53 (!) 57  Resp: 14 18  Temp: (!) 36.3 C 36.5 C  SpO2: 97% 99%    Last Pain:  Vitals:   03/22/20 0946  TempSrc: Axillary  PainSc: 0-No pain                 Zanayah Shadowens

## 2020-03-22 NOTE — Discharge Instructions (Signed)
Please discharge patient when stable, will follow up today with Dr. Peachie Barkalow at the [facility] office immediately following discharge.  Leave shield in place until visit.  All paperwork with discharge instructions will be given at the office.  [facility] Address:  [address]  [place], [place] [address]  

## 2020-03-22 NOTE — Transfer of Care (Signed)
Immediate Anesthesia Transfer of Care Note  Patient: Matthew Adkins  Procedure(s) Performed: CATARACT EXTRACTION PHACO AND INTRAOCULAR LENS PLACEMENT RIGHT EYE (Right Eye)  Patient Location: Short Stay  Anesthesia Type:MAC  Level of Consciousness: awake, alert  and patient cooperative  Airway & Oxygen Therapy: Patient Spontanous Breathing  Post-op Assessment: Report given to RN and Post -op Vital signs reviewed and stable  Post vital signs: Reviewed and stable  Last Vitals:  Vitals Value Taken Time  BP    Temp    Pulse    Resp    SpO2     See vital sign flow sheet Last Pain:  Vitals:   03/22/20 0852  TempSrc: Oral  PainSc: 0-No pain         Complications: No complications documented.

## 2020-03-22 NOTE — Op Note (Signed)
Date of procedure: 03/22/20  Pre-operative diagnosis:  Visually significant combined form age-related cataract, Right Eye (H25.811)  Post-operative diagnosis:  Visually significant combined form age-related cataract, Right Eye (H25.811)  Procedure: Removal of cataract via phacoemulsification and insertion of intra-ocular lens Wynetta Emery and [place]  +19.5D into the capsular bag of the Right Eye  Attending surgeon: Gerda Diss. Wrzosek, MD, MA  Anesthesia: MAC, Topical [place]  Complications: None  Estimated Blood Loss: <10m (minimal)  Specimens: None  Implants: As above  Indications:  Visually significant age-related cataract, Right Eye  Procedure:  The patient was seen and identified in the pre-operative area. The operative eye was identified and dilated.  The operative eye was marked.  Topical anesthesia was administered to the operative eye.     The patient was then to the operative suite and placed in the supine position.  A timeout was performed confirming the patient, procedure to be performed, and all other relevant information.   The patient's face was prepped and draped in the usual fashion for intra-ocular surgery.  A lid speculum was placed into the operative eye and the surgical microscope moved into place and focused.  A superotemporal paracentesis was created using a 20 gauge paracentesis blade.  Shugarcaine was injected into the anterior chamber.  Viscoelastic was injected into the anterior chamber.  A temporal clear-corneal main wound incision was created using a 2.431mmicrokeratome.  A continuous curvilinear capsulorrhexis was initiated using an irrigating cystitome and completed using capsulorrhexis forceps.  Hydrodissection and hydrodeliniation were performed.  Viscoelastic was injected into the anterior chamber.  A phacoemulsification handpiece and a chopper as a second instrument were used to remove the nucleus and epinucleus. The irrigation/aspiration handpiece was  used to remove any remaining cortical material.   The capsular bag was reinflated with viscoelastic, checked, and found to be intact.  The intraocular lens was inserted into the capsular bag.  The irrigation/aspiration handpiece was used to remove any remaining viscoelastic.  The clear corneal wound and paracentesis wounds were then hydrated and checked with Weck-Cels to be watertight.  The lid-speculum was removed.  The drape was removed.  The patient's face was cleaned with a wet and dry 4x4.   Maxitrol was instilled in the eye. A clear shield was taped over the eye. The patient was taken to the post-operative care unit in good condition, having tolerated the procedure well.  Post-Op Instructions: [facility] and will receive all other orders and instructions.

## 2020-03-22 NOTE — Anesthesia Procedure Notes (Signed)
Procedure Name: MAC Date/Time: 03/22/2020 9:26 AM Performed by: Vista Deck, CRNA Pre-anesthesia Checklist: Patient identified, Emergency Drugs available, Suction available, Timeout performed and Patient being monitored Patient Re-evaluated:Patient Re-evaluated prior to induction Oxygen Delivery Method: Nasal Cannula

## 2020-03-25 ENCOUNTER — Encounter (HOSPITAL_COMMUNITY): Payer: Self-pay | Admitting: Ophthalmology

## 2020-03-27 ENCOUNTER — Other Ambulatory Visit: Payer: Self-pay

## 2020-03-27 ENCOUNTER — Ambulatory Visit (INDEPENDENT_AMBULATORY_CARE_PROVIDER_SITE_OTHER): Payer: Medicare PPO

## 2020-03-27 VITALS — BP 149/82 | Ht 75.0 in | Wt 200.0 lb

## 2020-03-27 DIAGNOSIS — I10 Essential (primary) hypertension: Secondary | ICD-10-CM

## 2020-03-27 DIAGNOSIS — C61 Malignant neoplasm of prostate: Secondary | ICD-10-CM | POA: Diagnosis not present

## 2020-03-27 DIAGNOSIS — Z8546 Personal history of malignant neoplasm of prostate: Secondary | ICD-10-CM

## 2020-03-27 DIAGNOSIS — Z0000 Encounter for general adult medical examination without abnormal findings: Secondary | ICD-10-CM

## 2020-03-27 MED ORDER — AMLODIPINE BESYLATE 10 MG PO TABS: 5.0000 mg | ORAL_TABLET | Freq: Every day | ORAL | 5 refills | Status: DC

## 2020-03-27 MED ORDER — FLUOXETINE HCL 20 MG PO CAPS: 20.0000 mg | ORAL_CAPSULE | Freq: Every day | ORAL | 5 refills | Status: DC

## 2020-03-27 MED ORDER — BUPROPION HCL ER (SR) 150 MG PO TB12: 150.0000 mg | ORAL_TABLET | Freq: Two times a day (BID) | ORAL | 5 refills | Status: DC

## 2020-03-27 NOTE — Patient Instructions (Addendum)
Matthew Adkins , Thank you for taking time to come for your Medicare Wellness Visit. I appreciate your ongoing commitment to your health goals. Please review the following plan we discussed and let me know if I can assist you in the future.   Screening recommendations/referrals: Colonoscopy: due- scheduled  Recommended yearly ophthalmology/optometry visit for glaucoma screening and checkup Recommended yearly dental visit for hygiene and checkup  Vaccinations: Influenza vaccine: up to date  Pneumococcal vaccine: up to date  Tdap vaccine: not covered as a preventative vaccine  Shingles vaccine: information enclosed     Conditions/risks identified: smoking, fall risk   Next appointment: March 28, 2021 10:45 am  Preventive Care 72 Years and Older, Male Preventive care refers to lifestyle choices and visits with your health care provider that can promote health and wellness. What does preventive care include?  A yearly physical exam. This is also called an annual well check.  Dental exams once or twice a year.  Routine eye exams. Ask your health care provider how often you should have your eyes checked.  Personal lifestyle choices, including:  Daily care of your teeth and gums.  Regular physical activity.  Eating a healthy diet.  Avoiding tobacco and drug use.  Limiting alcohol use.  Practicing safe sex.  Taking low doses of aspirin every day.  Taking vitamin and mineral supplements as recommended by your health care provider. What happens during an annual well check? The services and screenings done by your health care provider during your annual well check will depend on your age, overall health, lifestyle risk factors, and family history of disease. Counseling  Your health care provider may ask you questions about your:  Alcohol use.  Tobacco use.  Drug use.  Emotional well-being.  Home and relationship well-being.  Sexual activity.  Eating  habits.  History of falls.  Memory and ability to understand (cognition).  Work and work Statistician. Screening  You may have the following tests or measurements:  Height, weight, and BMI.  Blood pressure.  Lipid and cholesterol levels. These may be checked every 5 years, or more frequently if you are over 39 years old.  Skin check.  Lung cancer screening. You may have this screening every year starting at age 12 if you have a 30-pack-year history of smoking and currently smoke or have quit within the past 15 years.  Fecal occult blood test (FOBT) of the stool. You may have this test every year starting at age 10.  Flexible sigmoidoscopy or colonoscopy. You may have a sigmoidoscopy every 5 years or a colonoscopy every 10 years starting at age 68.  Prostate cancer screening. Recommendations will vary depending on your family history and other risks.  Hepatitis C blood test.  Hepatitis B blood test.  Sexually transmitted disease (STD) testing.  Diabetes screening. This is done by checking your blood sugar (glucose) after you have not eaten for a while (fasting). You may have this done every 1-3 years.  Abdominal aortic aneurysm (AAA) screening. You may need this if you are a current or former smoker.  Osteoporosis. You may be screened starting at age 2 if you are at high risk. Talk with your health care provider about your test results, treatment options, and if necessary, the need for more tests. Vaccines  Your health care provider may recommend certain vaccines, such as:  Influenza vaccine. This is recommended every year.  Tetanus, diphtheria, and acellular pertussis (Tdap, Td) vaccine. You may need a Td booster every 10  years.  Zoster vaccine. You may need this after age 94.  Pneumococcal 13-valent conjugate (PCV13) vaccine. One dose is recommended after age 76.  Pneumococcal polysaccharide (PPSV23) vaccine. One dose is recommended after age 64. Talk to your health  care provider about which screenings and vaccines you need and how often you need them. This information is not intended to replace advice given to you by your health care provider. Make sure you discuss any questions you have with your health care provider. Document Released: 01/18/2015 Document Revised: 09/11/2015 Document Reviewed: 10/23/2014 Elsevier Interactive Patient Education  2017 [place] Prevention in the Home Falls can cause injuries. They can happen to people of all ages. There are many things you can do to make your home safe and to help prevent falls. What can I do on the outside of my home?  Regularly fix the edges of walkways and driveways and fix any cracks.  Remove anything that might make you trip as you walk through a door, such as a raised step or threshold.  Trim any bushes or trees on the path to your home.  Use bright outdoor lighting.  Clear any walking paths of anything that might make someone trip, such as rocks or tools.  Regularly check to see if handrails are loose or broken. Make sure that both sides of any steps have handrails.  Any raised decks and porches should have guardrails on the edges.  Have any leaves, snow, or ice cleared regularly.  Use sand or salt on walking paths during winter.  Clean up any spills in your garage right away. This includes oil or grease spills. What can I do in the bathroom?  Use night lights.  Install grab bars by the toilet and in the tub and shower. Do not use towel bars as grab bars.  Use non-skid mats or decals in the tub or shower.  If you need to sit down in the shower, use a plastic, non-slip stool.  Keep the floor dry. Clean up any water that spills on the floor as soon as it happens.  Remove soap buildup in the tub or shower regularly.  Attach bath mats securely with double-sided non-slip rug tape.  Do not have throw rugs and other things on the floor that can make you trip. What can I do  in the bedroom?  Use night lights.  Make sure that you have a light by your bed that is easy to reach.  Do not use any sheets or blankets that are too big for your bed. They should not hang down onto the floor.  Have a firm chair that has side arms. You can use this for support while you get dressed.  Do not have throw rugs and other things on the floor that can make you trip. What can I do in the kitchen?  Clean up any spills right away.  Avoid walking on wet floors.  Keep items that you use a lot in easy-to-reach places.  If you need to reach something above you, use a strong step stool that has a grab bar.  Keep electrical cords out of the way.  Do not use floor polish or wax that makes floors slippery. If you must use wax, use non-skid floor wax.  Do not have throw rugs and other things on the floor that can make you trip. What can I do with my stairs?  Do not leave any items on the stairs.  Make sure that  there are handrails on both sides of the stairs and use them. Fix handrails that are broken or loose. Make sure that handrails are as long as the stairways.  Check any carpeting to make sure that it is firmly attached to the stairs. Fix any carpet that is loose or worn.  Avoid having throw rugs at the top or bottom of the stairs. If you do have throw rugs, attach them to the floor with carpet tape.  Make sure that you have a light switch at the top of the stairs and the bottom of the stairs. If you do not have them, ask someone to add them for you. What else can I do to help prevent falls?  Wear shoes that:  Do not have high heels.  Have rubber bottoms.  Are comfortable and fit you well.  Are closed at the toe. Do not wear sandals.  If you use a stepladder:  Make sure that it is fully opened. Do not climb a closed stepladder.  Make sure that both sides of the stepladder are locked into place.  Ask someone to hold it for you, if possible.  Clearly mark  and make sure that you can see:  Any grab bars or handrails.  First and last steps.  Where the edge of each step is.  Use tools that help you move around (mobility aids) if they are needed. These include:  Canes.  Walkers.  Scooters.  Crutches.  Turn on the lights when you go into a dark area. Replace any light bulbs as soon as they burn out.  Set up your furniture so you have a clear path. Avoid moving your furniture around.  If any of your floors are uneven, fix them.  If there are any pets around you, be aware of where they are.  Review your medicines with your doctor. Some medicines can make you feel dizzy. This can increase your chance of falling. Ask your doctor what other things that you can do to help prevent falls. This information is not intended to replace advice given to you by your health care provider. Make sure you discuss any questions you have with your health care provider. Document Released: 10/18/2008 Document Revised: 05/30/2015 Document Reviewed: 01/26/2014 Elsevier Interactive Patient Education  2017 [employer].

## 2020-03-27 NOTE — Progress Notes (Signed)
Subjective:   Matthew Adkins is a 70 y.o. male who presents for Medicare Annual/Subsequent preventive examination.  Review of Systems     Cardiac Risk Factors include: advanced age (>53men, >90 women);dyslipidemia;hypertension;male gender;sedentary lifestyle;smoking/ tobacco exposure     Objective:    Today's Vitals   03/27/20 1459  BP: (!) 149/82  Weight: 200 lb (90.7 kg)  Height: 6\' 3"  (1.905 m)  PainSc: 0-No pain   Body mass index is 25 kg/m.  Advanced Directives 03/22/2020 03/01/2020 11/09/2019 11/08/2019 06/23/2019 04/16/2018 03/15/2017  Does Patient Have a Medical Advance Directive? No No No No No No Yes  Type of Advance Directive - - - - - - Therapist, sports in Chart? - - - - - - No - copy requested  Would patient like information on creating a medical advance directive? No - Patient declined No - Patient declined Yes (Inpatient - patient requests chaplain consult to create a medical advance directive) - - - -    Current Medications (verified) Outpatient Encounter Medications as of 03/27/2020  Medication Sig  . acetaminophen (TYLENOL) 500 MG tablet Take 1,000 mg by mouth every 6 (six) hours as needed for mild pain.  . Multiple Vitamins-Minerals (ONE DAILY MENS 50+ MULTIVIT PO) Take 1 tablet by mouth daily.  Marland Kitchen omeprazole (PRILOSEC) 20 MG capsule Take 1 capsule (20 mg total) by mouth daily. TAKE 1 CAPSULE BY MOUTH 30 MINUTES PRIOR TO BREAKFAST.  Marland Kitchen polyethylene glycol-electrolytes (TRILYTE) 420 g solution Take 4,000 mLs by mouth as directed.  . Simethicone 80 MG TABS Take 1 tablet (80 mg total) by mouth daily as needed. (Patient taking differently: Take 1 tablet by mouth daily as needed (flatulance).)  . [DISCONTINUED] amLODipine (NORVASC) 10 MG tablet Take 0.5 tablets (5 mg total) by mouth daily.  . [DISCONTINUED] buPROPion (WELLBUTRIN SR) 150 MG 12 hr tablet Take 1 tablet (150 mg total) by mouth 2 (two) times daily.  .  [DISCONTINUED] FLUoxetine (PROZAC) 20 MG capsule Take 1 capsule (20 mg total) by mouth daily.  Marland Kitchen amLODipine (NORVASC) 10 MG tablet Take 0.5 tablets (5 mg total) by mouth daily.  Marland Kitchen buPROPion (WELLBUTRIN SR) 150 MG 12 hr tablet Take 1 tablet (150 mg total) by mouth 2 (two) times daily.  Marland Kitchen FLUoxetine (PROZAC) 20 MG capsule Take 1 capsule (20 mg total) by mouth daily.   No facility-administered encounter medications on file as of 03/27/2020.    Allergies (verified) Bee venom and Codeine   History: Past Medical History:  Diagnosis Date  . Bladder cancer ([place]) 2000  . Depression   . Essential hypertension 01/16/2018  . Nicotine addiction   . Prostate cancer ([place]) 07/06/2014  . Seasonal allergies    Past Surgical History:  Procedure Laterality Date  . athroscopy of right knee  2002  . cancerous polyps removed from bladder  2000  . CATARACT EXTRACTION W/PHACO Left 03/01/2020   Procedure: CATARACT EXTRACTION PHACO AND INTRAOCULAR LENS PLACEMENT (IOC);  Surgeon: Baruch Goldmann, MD;  Location: [facility];  Service: Ophthalmology;  Laterality: Left;  CDE  4.96  . CATARACT EXTRACTION W/PHACO Right 03/22/2020   Procedure: CATARACT EXTRACTION PHACO AND INTRAOCULAR LENS PLACEMENT RIGHT EYE;  Surgeon: Baruch Goldmann, MD;  Location: [facility];  Service: Ophthalmology;  Laterality: Right;  CDE 5.78  . COLONOSCOPY  MJ 2009 pmhX: POLYPS   POLYP REMOVED BUT NOT RETRIEVED  . COLONOSCOPY  LS 2007 ARS   ? POLYPS, no path available  .  COLONOSCOPY  09/26/2010   sessile poylp(2) pan-colonic diverticulosis,mild/internal hemorrhoids  . COLONOSCOPY WITH PROPOFOL N/A 02/09/2017   Procedure: COLONOSCOPY WITH PROPOFOL;  Surgeon: Danie Binder, MD;  [facility];  Service: Endoscopy;  Laterality: N/A;  8:30am  . ESOPHAGOGASTRODUODENOSCOPY (EGD) WITH PROPOFOL N/A 02/09/2017   Procedure: ESOPHAGOGASTRODUODENOSCOPY (EGD) WITH PROPOFOL;  Surgeon: Danie Binder, MD;  [facility];  Service: Endoscopy;   Laterality: N/A;  . FOOT SURGERY Bilateral 2004  . HERNIA REPAIR  1980's   left inguinal hernia  . LYMPHADENECTOMY Bilateral 09/03/2014   Procedure: PELVIC LYMPHADENECTOMY;  Surgeon: Raynelle Bring, MD;  Location: [facility];  Service: Urology;  Laterality: Bilateral;  . POLYPECTOMY  02/09/2017   Procedure: POLYPECTOMY;  Surgeon: Danie Binder, MD;  [facility];  Service: Endoscopy;;  cecal polyp, hepatic flexure polyps x3, transverse colon polyp, descending colon polyps x2, rectal polyps x2  . PROSTATE BIOPSY  07/06/14  . ROBOT ASSISTED LAPAROSCOPIC RADICAL PROSTATECTOMY N/A 09/03/2014   Procedure: ROBOTIC ASSISTED LAPAROSCOPIC RADICAL PROSTATECTOMY LEVEL 2;  Surgeon: Raynelle Bring, MD;  Location: [facility];  Service: Urology;  Laterality: N/A;   Family History  Problem Relation Age of Onset  . Heart attack Father   . Diabetes Sister   . Hypertension Sister   . Hypertension Sister   . Aneurysm Sister        brain   . Arthritis Other        family history   . Colon cancer Neg Hx   . Colon polyps Neg Hx    Social History   Socioeconomic History  . Marital status: Married    Spouse name: Not on file  . Number of children: 2  . Years of education: Not on file  . Highest education level: Not on file  Occupational History  . Occupation: unemployed   Tobacco Use  . Smoking status: Current Every Day Smoker    Packs/day: 0.25    Years: 30.00    Pack years: 7.50    Types: Cigarettes  . Smokeless tobacco: Never Used  Vaping Use  . Vaping Use: Never used  Substance and Sexual Activity  . Alcohol use: Yes    Comment: 3 pints/month  . Drug use: Yes    Types: Marijuana    Comment: occassionally  . Sexual activity: Yes    Birth control/protection: None  Other Topics Concern  . Not on file  Social History Narrative  . Not on file   Social Determinants of Health   Financial Resource Strain: Not on file  Food Insecurity: Not on file  Transportation Needs: No Transportation  Needs  . Lack of Transportation (Medical): No  . Lack of Transportation (Non-Medical): No  Physical Activity: Not on file  Stress: Not on file  Social Connections: Moderately Isolated  . Frequency of Communication with Friends and Family: Never  . Frequency of Social Gatherings with Friends and Family: Never  . Attends Religious Services: Never  . Active Member of Clubs or Organizations: Yes  . Attends Archivist Meetings: More than 4 times per year  . Marital Status: Married    Tobacco Counseling Ready to quit: Not Answered Counseling given: Not Answered   Clinical Intake:  Pre-visit preparation completed: Yes  Pain : No/denies pain Pain Score: 0-No pain     Nutritional Status: BMI 25 -29 Overweight Diabetes: No  How often do you need to have someone help you when you read instructions, pamphlets, or other  written materials from your doctor or pharmacy?: 1 - Never What is the last grade level you completed in school?: 12 th grade  Diabetic? no  Interpreter Needed?: No  Information entered by :: Matthew Millspaugh lpn   Activities of Daily Living In your present state of health, do you have any difficulty performing the following activities: 03/27/2020 03/01/2020  Hearing? N -  Vision? N -  Difficulty concentrating or making decisions? N -  Walking or climbing stairs? Y -  Dressing or bathing? N -  Doing errands, shopping? N N  Preparing Food and eating ? N -  Using the Toilet? N -  In the past six months, have you accidently leaked urine? N -  Do you have problems with loss of bowel control? N -  Managing your Medications? N -  Managing your Finances? N -  Housekeeping or managing your Housekeeping? N -  Some recent data might be hidden    Patient Care Team: Fayrene Helper, MD as PCP - General Branch, Alphonse Guild, MD as PCP - Cardiology (Cardiology) Danie Binder, MD (Inactive) (Gastroenterology)  Indicate any recent Medical Services you may have  received from other than [facility] providers in the past year (date may be approximate).     Assessment:   This is a routine wellness examination for Jahmani.  Hearing/Vision screen No exam data present  Dietary issues and exercise activities discussed: Current Exercise Habits: The patient does not participate in regular exercise at present, Exercise limited by: respiratory conditions(s)  Goals    . Exercise 3x per week (30 min per time)    . LIFESTYLE - DECREASE FALLS RISK    . Quit smoking / using tobacco     patient would like to quit smoking- has cut back recently       Depression Screen PHQ 2/9 Scores 03/27/2020 02/23/2020 11/22/2019 08/21/2019 08/21/2019 11/21/2018 07/19/2018  PHQ - 2 Score 0 0 0 2 1 0 0  PHQ- 9 Score - - 0 3 - 0 -    Fall Risk Fall Risk  03/27/2020 02/23/2020 11/22/2019 08/21/2019 03/27/2019  Falls in the past year? 0 0 0 0 0  Number falls in past yr: 0 - 0 - 0  Injury with Fall? 0 - 0 - 0  Risk for fall due to : - - No Fall Risks - -  Follow up - - Falls evaluation completed - -    FALL RISK PREVENTION PERTAINING TO THE HOME:  Any stairs in or around the home? No  If so, are there any without handrails? No  Home free of loose throw rugs in walkways, pet beds, electrical cords, etc? Yes  Adequate lighting in your home to reduce risk of falls? Yes   ASSISTIVE DEVICES UTILIZED TO PREVENT FALLS:  Life alert? No  Use of a cane, walker or w/c? Yes  Grab bars in the bathroom? Yes  Shower chair or bench in shower? Yes  Elevated toilet seat or a handicapped toilet? No   TIMED UP AND GO:  Was the test performed? No .  Length of time to ambulate 10 feet:  sec.     Cognitive Function:     6CIT Screen 03/27/2020 03/27/2019 07/19/2018 03/22/2018 03/22/2018  What Year? 0 points 0 points 0 points 0 points 0 points  What month? 0 points 0 points 0 points 0 points 0 points  What time? 0 points 0 points 0 points 0 points 0 points  Count back from 20  0 points 0 points  0 points 0 points 0 points  Months in reverse 0 points 0 points 0 points 0 points 0 points  Repeat phrase 0 points 0 points 0 points 0 points 2 points  Total Score 0 0 0 0 2    Immunizations Immunization History  Administered Date(s) Administered  . Fluad Quad(high Dose 65+) 10/03/2018  . Influenza Split 09/20/2010, 10/06/2011  . Influenza Whole 10/02/2008, 09/19/2009  . Influenza, High Dose Seasonal PF 11/05/2017, 11/07/2019  . Influenza,inj,Quad PF,6+ Mos 09/19/2014, 10/23/2015, 10/07/2016  . Moderna SARS-COV2 Booster Vaccination 11/23/2019  . Moderna Sars-Covid-2 Vaccination 02/12/2019, 03/15/2019  . Pneumococcal Conjugate-13 05/30/2014  . Pneumococcal Polysaccharide-23 10/23/2015  . Td 10/21/2009  . Zoster 06/25/2010    TDAP status: Due, Education has been provided regarding the importance of this vaccine. Advised may receive this vaccine at local pharmacy or Health Dept. Aware to provide a copy of the vaccination record if obtained from local pharmacy or Health Dept. Verbalized acceptance and understanding.  Flu Vaccine status: Up to date  Pneumococcal vaccine status: Up to date  Covid-19 vaccine status: Completed vaccines  Qualifies for Shingles Vaccine? Yes   Zostavax completed Yes   Shingrix Completed?: No.    Education has been provided regarding the importance of this vaccine. Patient has been advised to call insurance company to determine out of pocket expense if they have not yet received this vaccine. Advised may also receive vaccine at local pharmacy or Health Dept. Verbalized acceptance and understanding.  Screening Tests Health Maintenance  Topic Date Due  . TETANUS/TDAP  02/20/2021 (Originally 10/22/2019)  . COVID-19 Vaccine (4 - Booster for Moderna series) 05/22/2020  . COLONOSCOPY (Pts 45-46yrs Insurance coverage will need to be confirmed)  02/10/2027  . INFLUENZA VACCINE  Completed  . Hepatitis C Screening  Completed  . PNA vac Low Risk Adult  Completed   . HPV VACCINES  Aged Out    Health Maintenance  There are no preventive care reminders to display for this patient.  Colorectal cancer screening: Referral to GI placed Done in 2019- due again and scheduled next month. Pt aware the office will call re: appt.  Lung Cancer Screening: (Low Dose CT Chest recommended if Age 54-80 years, 30 pack-year currently smoking OR have quit w/in 15years.) does qualify.   Lung Cancer Screening Referral: referral placed   Additional Screening:  Hepatitis C Screening: does qualify; Completed yes  Vision Screening: Recommended annual ophthalmology exams for early detection of glaucoma and other disorders of the eye. Is the patient up to date with their annual eye exam?  Yes  Who is the provider or what is the name of the office in which the patient attends annual eye exams? [facility]  If pt is not established with a provider, would they like to be referred to a provider to establish care? No .   Dental Screening: Recommended annual dental exams for proper oral hygiene  Community Resource Referral / Chronic Care Management: CRR required this visit?  Yes  referred for food insecurity   CCM required this visit?  No      Plan:     I have personally reviewed and noted the following in the patient's chart:   . Medical and social history . Use of alcohol, tobacco or illicit drugs  . Current medications and supplements . Functional ability and status . Nutritional status . Physical activity . Advanced directives . List of other physicians . Hospitalizations, surgeries, and ER visits  in previous 12 months . Vitals . Screenings to include cognitive, depression, and falls . Referrals and appointments  In addition, I have reviewed and discussed with patient certain preventive protocols, quality metrics, and best practice recommendations. A written personalized care plan for preventive services as well as general preventive health  recommendations were provided to patient.     Kate Sable, LPN, LPN   4/65/0354   Nurse Notes: Visit performed by telephone. Patient at home and supervising provider in the office. Patient consents to telephone visit. Time spent with pt 25 mins.

## 2020-03-29 ENCOUNTER — Encounter: Payer: Self-pay | Admitting: Acute Care

## 2020-04-02 ENCOUNTER — Other Ambulatory Visit: Payer: Self-pay | Admitting: Internal Medicine

## 2020-04-02 ENCOUNTER — Telehealth: Payer: Self-pay

## 2020-04-02 DIAGNOSIS — R140 Abdominal distension (gaseous): Secondary | ICD-10-CM

## 2020-04-02 NOTE — Telephone Encounter (Signed)
   Telephone encounter was:  Successful.  04/02/2020 Name: Matthew Adkins MRN: [id number] DOB: Dec 17, 1950  Matthew Adkins is a 70 y.o. year old male who is a primary care patient of Moshe Cipro Norwood Levo, MD . The community resource team was consulted for assistance with [place] guide performed the following interventions: Patient provided with information about care guide support team and interviewed to confirm resource needs Obtained verbal consent to place patient referral to [facility] for SNAP benefits.  Follow Up Plan:  Care guide will follow up with patient by phone over the next 7 days  Shiheem Corporan, AAS Paralegal, [place] . Embedded Care Coordination [facility]  Care Management  [address], [place] [address] ??[email or web address]  ?? [phone]   [email or web address]

## 2020-04-03 ENCOUNTER — Ambulatory Visit: Payer: Medicare PPO | Admitting: Urology

## 2020-04-03 DIAGNOSIS — C61 Malignant neoplasm of prostate: Secondary | ICD-10-CM

## 2020-04-04 ENCOUNTER — Other Ambulatory Visit: Payer: Self-pay | Admitting: Internal Medicine

## 2020-04-04 DIAGNOSIS — R140 Abdominal distension (gaseous): Secondary | ICD-10-CM

## 2020-04-05 ENCOUNTER — Telehealth: Payer: Self-pay

## 2020-04-05 ENCOUNTER — Other Ambulatory Visit: Payer: Self-pay | Admitting: Internal Medicine

## 2020-04-05 DIAGNOSIS — R140 Abdominal distension (gaseous): Secondary | ICD-10-CM

## 2020-04-05 NOTE — Telephone Encounter (Signed)
   Telephone encounter was:  Successful.  04/05/2020 Name: Matthew Adkins MRN: [id number] DOB: [birth year]  Matthew Adkins is a 70 y.o. year old male who is a primary care patient of Moshe Cipro Norwood Levo, MD . The community resource team was consulted for assistance with [place] guide performed the following interventions: Follow up call placed to the patient to discuss status of referral Spoke with patient he was contacted by  [employer] to assist with filing for food stamps.  She informed him that he was not eligible due to his income.  Asked patient if there was anything else that I could assist him with he responded he was ok. Gave patient information about food banks.   .  Follow Up Plan:  No further follow up planned at this time. The patient has been provided with needed resources.  Brittiny Levitz, AAS Paralegal, [place] . Embedded Care Coordination [facility] Health  Care Management  [address], [place] [address] ??[email or web address]  ?? [phone]   [email or web address]

## 2020-04-10 NOTE — Progress Notes (Signed)
Virtual Visit via Telephone Note   This visit type was conducted due to national recommendations for restrictions regarding the COVID-19 Pandemic (e.g. social distancing) in an effort to limit this patient's exposure and mitigate transmission in our community.  Due to his co-morbid illnesses, this patient is at least at moderate risk for complications without adequate follow up.  This format is felt to be most appropriate for this patient at this time.  The patient did not have access to video technology/had technical difficulties with video requiring transitioning to audio format only (telephone).  All issues noted in this document were discussed and addressed.  No physical exam could be performed with this format.  Please refer to the patient's chart for his  consent to telehealth for [facility].    Date:  04/11/2020   ID:  [name], DOB 03-Dec-1950, MRN [id number] The patient was identified using 2 identifiers.  Patient Location: Home Provider Location: Office/Clinic   PCP:  Fayrene Helper, [place]  Cardiologist:  Carlyle Dolly, MD [id number]   Evaluation Performed:  Follow-Up Visit  Chief Complaint:  3 month visit  History of Present Illness:    Matthew Adkins is a 70 y.o. male with past medical history of HTN, prediabetes, carotid artery stenosis, tobacco use and history of prostate cancer who presents for a 89-month follow-up telehealth visit.  He was last examined by Coletta Memos, NP in 12/2019 following a recent hospitalization for dizziness and tachycardia. Was treated for orthostasis that admission. At the time of his visit, he denied any recurrent episodes and had been trying to increase his fluid intake. He had previously wore an event monitor which had not yet resulted. This did show normal sinus rhythm with an average heart rate of 68 bpm and he did have rare supraventricular ectopy in the form of isoalted PAC's, couplets,  triplets with occasional runs of SVT and rare ventricular ectopy in the form of isolated PVCs, couplets, triplets.  He denied any recurrent symptoms when he was called with monitor results, therefore he was continue on his current regimen.   In talking with the patient today, he reports being active at baseline as he is the primary caregiver for his wife and also his sister who live in the home and they both suffer from dementia. His wife is able to ambulate but his sister requires help with all transfers. He does have a caregiver that has been coming to help a few hours each day over the past few months and says this has helped with the stress. He denies any recent chest pain or dyspnea on exertion. No recent palpitations, orthopnea, PND or lower extremity edema. Says that he did increase his fluid intake following his hospitalization last year and denies any recurrent dizziness or presyncope.  The patient does not have symptoms concerning for COVID-19 infection (fever, chills, cough, or new shortness of breath).    Past Medical History:  Diagnosis Date  . Bladder cancer ([place]) 2000  . Depression   . Essential hypertension 01/16/2018  . Nicotine addiction   . Prostate cancer ([place]) 07/06/2014  . Seasonal allergies    Past Surgical History:  Procedure Laterality Date  . athroscopy of right knee  2002  . cancerous polyps removed from bladder  2000  . CATARACT EXTRACTION W/PHACO Left 03/01/2020   Procedure: CATARACT EXTRACTION PHACO AND INTRAOCULAR LENS PLACEMENT (IOC);  Surgeon: Baruch Goldmann, MD;  Location: [facility];  Service:  Ophthalmology;  Laterality: Left;  CDE  4.96  . CATARACT EXTRACTION W/PHACO Right 03/22/2020   Procedure: CATARACT EXTRACTION PHACO AND INTRAOCULAR LENS PLACEMENT RIGHT EYE;  Surgeon: Baruch Goldmann, MD;  Location: [facility];  Service: Ophthalmology;  Laterality: Right;  CDE 5.78  . COLONOSCOPY  MJ 2009 pmhX: POLYPS   POLYP REMOVED BUT NOT RETRIEVED  . COLONOSCOPY  LS 2007 ARS    ? POLYPS, no path available  . COLONOSCOPY  09/26/2010   sessile poylp(2) pan-colonic diverticulosis,mild/internal hemorrhoids  . COLONOSCOPY WITH PROPOFOL N/A 02/09/2017   Procedure: COLONOSCOPY WITH PROPOFOL;  Surgeon: Danie Binder, MD;  Location: [facility];  Service: Endoscopy;  Laterality: N/A;  8:30am  . ESOPHAGOGASTRODUODENOSCOPY (EGD) WITH PROPOFOL N/A 02/09/2017   Procedure: ESOPHAGOGASTRODUODENOSCOPY (EGD) WITH PROPOFOL;  Surgeon: Danie Binder, MD;  Location: [facility];  Service: Endoscopy;  Laterality: N/A;  . FOOT SURGERY Bilateral 2004  . HERNIA REPAIR  1980's   left inguinal hernia  . LYMPHADENECTOMY Bilateral 09/03/2014   Procedure: PELVIC LYMPHADENECTOMY;  Surgeon: Raynelle Bring, MD;  Location: [facility];  Service: Urology;  Laterality: Bilateral;  . POLYPECTOMY  02/09/2017   Procedure: POLYPECTOMY;  Surgeon: Danie Binder, MD;  Location: [facility];  Service: Endoscopy;;  cecal polyp, hepatic flexure polyps x3, transverse colon polyp, descending colon polyps x2, rectal polyps x2  . PROSTATE BIOPSY  07/06/14  . ROBOT ASSISTED LAPAROSCOPIC RADICAL PROSTATECTOMY N/A 09/03/2014   Procedure: ROBOTIC ASSISTED LAPAROSCOPIC RADICAL PROSTATECTOMY LEVEL 2;  Surgeon: Raynelle Bring, MD;  Location: [facility];  Service: Urology;  Laterality: N/A;     Current Meds  Medication Sig  . acetaminophen (TYLENOL) 500 MG tablet Take 1,000 mg by mouth every 6 (six) hours as needed for mild pain.  Marland Kitchen amLODipine (NORVASC) 10 MG tablet Take 0.5 tablets (5 mg total) by mouth daily.  Marland Kitchen buPROPion (WELLBUTRIN SR) 150 MG 12 hr tablet Take 1 tablet (150 mg total) by mouth 2 (two) times daily.  Marland Kitchen FLUoxetine (PROZAC) 20 MG capsule Take 1 capsule (20 mg total) by mouth daily.  . Multiple Vitamins-Minerals (ONE DAILY MENS 50+ MULTIVIT PO) Take 1 tablet by mouth daily.  Marland Kitchen omeprazole (PRILOSEC) 20 MG capsule Take 1 capsule (20 mg total) by mouth daily. TAKE 1 CAPSULE BY MOUTH 30 MINUTES PRIOR TO BREAKFAST.   Marland Kitchen polyethylene glycol-electrolytes (TRILYTE) 420 g solution Take 4,000 mLs by mouth as directed.  . simethicone (MYLICON) 80 MG chewable tablet TAKE (1) TABLET BY MOUTH ONCE DAILY.     Allergies:   Bee venom and Codeine   Social History   Tobacco Use  . Smoking status: Current Every Day Smoker    Packs/day: 0.25    Years: 30.00    Pack years: 7.50    Types: Cigarettes  . Smokeless tobacco: Never Used  Vaping Use  . Vaping Use: Never used  Substance Use Topics  . Alcohol use: Yes    Comment: 3 pints/month  . Drug use: Yes    Types: Marijuana    Comment: occassionally     Family Hx: The patient's family history includes Aneurysm in his sister; Arthritis in an other family member; Diabetes in his sister; Heart attack in his father; Hypertension in his sister and sister. There is no history of Colon cancer or Colon polyps.  ROS:   Please see the history of present illness.     All other systems reviewed and are negative.   Prior CV studies:   The following  studies were reviewed today:  Echocardiogram: 11/2019 IMPRESSIONS    1. Left ventricular ejection fraction, by estimation, is 60 to 65%. The  left ventricle has normal function. The left ventricle has no regional  wall motion abnormalities. There is mild left ventricular hypertrophy.  Left ventricular diastolic parameters  are consistent with Grade I diastolic dysfunction (impaired relaxation).  2. Right ventricular systolic function is normal. The right ventricular  size is normal.  3. Left atrial size was mild to moderately dilated.  4. The mitral valve is normal in structure. Mild mitral valve  regurgitation. No evidence of mitral stenosis.  5. The aortic valve has an indeterminant number of cusps. Aortic valve  regurgitation is not visualized. No aortic stenosis is present.  6. Aortic dilatation noted. There is mild dilatation of the aortic root,  measuring 41 mm.  7. The inferior vena cava is normal  in size with greater than 50%  respiratory variability, suggesting right atrial pressure of 3 mmHg.    Carotid Dopplers: 11/2019 IMPRESSION: Right:  Color duplex indicates minimal heterogeneous and calcified plaque, with no hemodynamically significant stenosis by duplex criteria in the extracranial cerebrovascular circulation.  Left:  Heterogeneous and partially calcified plaque at the left carotid bifurcation, with discordant results regarding degree of stenosis by established duplex criteria. Peak velocity suggests 50%-69% stenosis, with the ICA/ CCA ratio suggesting a lesser degree of stenosis. If establishing a more accurate degree of stenosis is required, cerebral angiogram should be considered, or as a second best test, CTA.   Event Monitor: 12/2019  11 day monitor  Max HR 143, Min HR 47, Avg HR 68  Rare supraventricular ectopy in the form of isoalted PACs, couplets, triplets. Occasional runs of SVT, can be long in duration up to 40 minutes.  Rare ventricular ectopy in the form of isolated PVCs, couplets, triplets  No diary submitted    Labs/Other Tests and Data Reviewed:    EKG:  No ECG reviewed.  Recent Labs: 08/18/2019: TSH 0.933 11/09/2019: Magnesium 1.8 11/10/2019: Hemoglobin 13.4; Platelets 191 11/27/2019: ALT 12; BUN 16; Creatinine, Ser 1.04; Potassium 4.0; Sodium 138   Recent Lipid Panel Lab Results  Component Value Date/Time   CHOL 177 03/07/2020 08:56 AM   TRIG 54 03/07/2020 08:56 AM   HDL 56 03/07/2020 08:56 AM   CHOLHDL 3.2 03/07/2020 08:56 AM   CHOLHDL 3.6 01/21/2018 11:21 AM   LDLCALC 110 (H) 03/07/2020 08:56 AM   LDLDIRECT 105 08/22/2014 10:20 AM    Wt Readings from Last 3 Encounters:  03/27/20 200 lb (90.7 kg)  03/07/20 200 lb 6.4 oz (90.9 kg)  03/07/20 200 lb 6.4 oz (90.9 kg)      Objective:    Vital Signs:  BP (!) 149/82   Pulse (!) 57    General: Pleasant male sounding in NAD Psych: Normal affect. Neuro: Alert and  oriented X 3.  Lungs:  Resp regular and unlabored while talking on the phone.    ASSESSMENT & PLAN:    1. Orthostatic Hypotension/Dizziness - He was previously found to be orthostatic last year and had associated dizziness and tachycardia but he denies any recurrent symptoms since. Reports he has increased his fluid intake. Prior monitor demonstrated episodes of SVT as outlined above with occasional ectopy. He denies any palpitations. Given his baseline heart rate in the 50's, would not start a beta-blocker at this time given his asymptomatic state.  2. HTN - BP was elevated at 149/82 upon the time of his cataract surgery  a few weeks ago but he reports having checked this at home in the interim and BP has overall been well controlled.  He is unsure of the specific readings. Continue Amlodipine 5 mg daily. Would not further titrate his medications at this time until orthostatics can be rechecked. He is no longer on Midodrine.  3. Carotid artery stenosis - Dopplers in 11/2019 showed no hemodynamically significant stenosis along the RICA but he was noted to have 62-44% LICA stenosis. Will plan for repeat carotid dopplers in 11/2020 and this was reviewed with the patient today.   4. Tobacco Use - He does smoke less than 0.5 ppd. Congratulated on his reduction with cessation advised.     COVID-19 Education: The signs and symptoms of COVID-19 were discussed with the patient and how to seek care for testing (follow up with PCP or arrange E-visit).  The importance of social distancing was discussed today.  Time:   Today, I have spent 9 minutes with the patient with telehealth technology discussing the above problems.     Medication Adjustments/Labs and Tests Ordered: Current medicines are reviewed at length with the patient today.  Concerns regarding medicines are outlined above.   Tests Ordered: Orders Placed This Encounter  Procedures  . US Carotid Duplex Bilateral    Medication  Changes: No orders of the defined types were placed in this encounter.   Follow Up:  In Person in 1 year(s)  Signed, Erma Heritage, PA-C  04/11/2020 3:39 PM    [place]

## 2020-04-11 ENCOUNTER — Encounter: Payer: Self-pay | Admitting: Student

## 2020-04-11 ENCOUNTER — Other Ambulatory Visit: Payer: Self-pay

## 2020-04-11 ENCOUNTER — Telehealth (INDEPENDENT_AMBULATORY_CARE_PROVIDER_SITE_OTHER): Payer: Medicare PPO | Admitting: Student

## 2020-04-11 VITALS — BP 149/82 | HR 57

## 2020-04-11 DIAGNOSIS — I6523 Occlusion and stenosis of bilateral carotid arteries: Secondary | ICD-10-CM | POA: Diagnosis not present

## 2020-04-11 DIAGNOSIS — R42 Dizziness and giddiness: Secondary | ICD-10-CM | POA: Diagnosis not present

## 2020-04-11 DIAGNOSIS — I951 Orthostatic hypotension: Secondary | ICD-10-CM

## 2020-04-11 DIAGNOSIS — Z720 Tobacco use: Secondary | ICD-10-CM | POA: Diagnosis not present

## 2020-04-11 DIAGNOSIS — I10 Essential (primary) hypertension: Secondary | ICD-10-CM | POA: Diagnosis not present

## 2020-04-11 NOTE — Patient Instructions (Signed)
Medication Instructions:  Your physician recommends that you continue on your current medications as directed. Please refer to the Current Medication list given to you today.  *If you need a refill on your cardiac medications before your next appointment, please call your pharmacy*   Lab Work: NONE   If you have labs (blood work) drawn today and your tests are completely normal, you will receive your results only by: Marland Kitchen MyChart Message (if you have MyChart) OR . A paper copy in the mail If you have any lab test that is abnormal or we need to change your treatment, we will call you to review the results.   Testing/Procedures: Your physician has requested that you have a carotid duplex. This test is an ultrasound of the carotid arteries in your neck. It looks at blood flow through these arteries that supply the brain with blood. Allow one hour for this exam. There are no restrictions or special instructions.   Follow-Up: At [facility], you and your health needs are our priority.  As part of our continuing mission to provide you with exceptional heart care, we have created designated Provider Care Teams.  These Care Teams include your primary Cardiologist (physician) and Advanced Practice Providers (APPs -  Physician Assistants and Nurse Practitioners) who all work together to provide you with the care you need, when you need it.  We recommend signing up for the patient portal called "MyChart".  Sign up information is provided on this After Visit Summary.  MyChart is used to connect with patients for Virtual Visits (Telemedicine).  Patients are able to view lab/test results, encounter notes, upcoming appointments, etc.  Non-urgent messages can be sent to your provider as well.   To learn more about what you can do with MyChart, go to [email or web address].    Your next appointment:   1 year(s)  The format for your next appointment:   In Person  Provider:   Carlyle Dolly, MD or  Bernerd Pho, PA-C   Other Instructions Thank you for choosing [facility]!

## 2020-04-22 ENCOUNTER — Encounter (HOSPITAL_COMMUNITY): Payer: Self-pay

## 2020-04-22 NOTE — OR Nursing (Signed)
Spoke with patient about arrival time for procedure. When asked about a ride for after the procedure, pt is not sure of who his ride is. Informed patient that he had to have a ride and to let us know if he could not find a ride. Pt verbalized understanding.

## 2020-04-24 ENCOUNTER — Other Ambulatory Visit (HOSPITAL_COMMUNITY)
Admission: RE | Admit: 2020-04-24 | Discharge: 2020-04-24 | Disposition: A | Discharge status: Home / Self Care | Payer: Medicare PPO | Source: Ambulatory Visit | Attending: Internal Medicine | Admitting: Internal Medicine

## 2020-04-24 ENCOUNTER — Other Ambulatory Visit: Payer: Self-pay

## 2020-04-24 DIAGNOSIS — Z20822 Contact with and (suspected) exposure to covid-19: Secondary | ICD-10-CM | POA: Diagnosis not present

## 2020-04-24 DIAGNOSIS — Z01812 Encounter for preprocedural laboratory examination: Secondary | ICD-10-CM | POA: Insufficient documentation

## 2020-04-24 LAB — SARS CORONAVIRUS 2 (TAT 6-24 HRS): SARS Coronavirus 2: NEGATIVE

## 2020-04-26 ENCOUNTER — Ambulatory Visit (HOSPITAL_COMMUNITY)
Admission: RE | Admit: 2020-04-26 | Discharge: 2020-04-26 | Disposition: A | Discharge status: Home / Self Care | Payer: Medicare PPO | Source: Ambulatory Visit | Attending: Internal Medicine | Admitting: Internal Medicine

## 2020-04-26 ENCOUNTER — Other Ambulatory Visit: Payer: Self-pay

## 2020-04-26 ENCOUNTER — Encounter (HOSPITAL_COMMUNITY): Payer: Self-pay

## 2020-04-26 ENCOUNTER — Ambulatory Visit (HOSPITAL_COMMUNITY): Payer: Medicare PPO | Admitting: Anesthesiology

## 2020-04-26 ENCOUNTER — Encounter (HOSPITAL_COMMUNITY)
Admission: RE | Disposition: A | Discharge status: Home / Self Care | Payer: Self-pay | Source: Ambulatory Visit | Attending: Internal Medicine

## 2020-04-26 DIAGNOSIS — D124 Benign neoplasm of descending colon: Secondary | ICD-10-CM

## 2020-04-26 DIAGNOSIS — K5730 Diverticulosis of large intestine without perforation or abscess without bleeding: Secondary | ICD-10-CM | POA: Insufficient documentation

## 2020-04-26 DIAGNOSIS — D123 Benign neoplasm of transverse colon: Secondary | ICD-10-CM | POA: Diagnosis not present

## 2020-04-26 DIAGNOSIS — F418 Other specified anxiety disorders: Secondary | ICD-10-CM | POA: Diagnosis not present

## 2020-04-26 DIAGNOSIS — Z86010 Personal history of colonic polyps: Secondary | ICD-10-CM | POA: Diagnosis not present

## 2020-04-26 DIAGNOSIS — F17210 Nicotine dependence, cigarettes, uncomplicated: Secondary | ICD-10-CM | POA: Insufficient documentation

## 2020-04-26 DIAGNOSIS — Z79899 Other long term (current) drug therapy: Secondary | ICD-10-CM | POA: Diagnosis not present

## 2020-04-26 DIAGNOSIS — Z09 Encounter for follow-up examination after completed treatment for conditions other than malignant neoplasm: Secondary | ICD-10-CM | POA: Diagnosis present

## 2020-04-26 DIAGNOSIS — Z8551 Personal history of malignant neoplasm of bladder: Secondary | ICD-10-CM | POA: Diagnosis not present

## 2020-04-26 DIAGNOSIS — K648 Other hemorrhoids: Secondary | ICD-10-CM | POA: Diagnosis not present

## 2020-04-26 DIAGNOSIS — Z8546 Personal history of malignant neoplasm of prostate: Secondary | ICD-10-CM | POA: Diagnosis not present

## 2020-04-26 DIAGNOSIS — Z91030 Bee allergy status: Secondary | ICD-10-CM | POA: Insufficient documentation

## 2020-04-26 DIAGNOSIS — Z1211 Encounter for screening for malignant neoplasm of colon: Secondary | ICD-10-CM | POA: Diagnosis not present

## 2020-04-26 DIAGNOSIS — K635 Polyp of colon: Secondary | ICD-10-CM | POA: Diagnosis not present

## 2020-04-26 HISTORY — PX: POLYPECTOMY: SHX5525

## 2020-04-26 HISTORY — PX: COLONOSCOPY WITH PROPOFOL: SHX5780

## 2020-04-26 SURGERY — COLONOSCOPY WITH PROPOFOL
Anesthesia: General

## 2020-04-26 MED ORDER — PROPOFOL 10 MG/ML IV BOLUS
INTRAVENOUS | Status: DC | PRN
  Administered 2020-04-26: 50 mg via INTRAVENOUS

## 2020-04-26 MED ORDER — STERILE WATER FOR IRRIGATION IR SOLN
Status: DC | PRN
  Administered 2020-04-26: 1.5 mL

## 2020-04-26 MED ORDER — PROPOFOL 500 MG/50ML IV EMUL
INTRAVENOUS | Status: DC | PRN
  Administered 2020-04-26: 150 ug/kg/min via INTRAVENOUS

## 2020-04-26 MED ORDER — LACTATED RINGERS IV SOLN
INTRAVENOUS | Status: DC
  Administered 2020-04-26: 1000 mL via INTRAVENOUS

## 2020-04-26 NOTE — Op Note (Signed)
[facility] Patient Name: Matthew Adkins Procedure Date: 04/26/2020 9:58 AM MRN: [id number] Date of Birth: [birth year] Attending MD: Elon Alas. Abbey Chatters DO CSN: [id number] Age: 70 Admit Type: Outpatient Procedure:                Colonoscopy Indications:              High risk colon cancer surveillance: Personal                            history of colonic polyps Providers:                Elon Alas. Abbey Chatters, DO, Aris Georgia, Aram Candela Referring MD:              Medicines:                See the Anesthesia note for documentation of the                            administered medications Complications:            No immediate complications. Estimated Blood Loss:     Estimated blood loss was minimal. Procedure:                Pre-Anesthesia Assessment:                           - The anesthesia plan was to use monitored                            anesthesia care (MAC).                           After obtaining informed consent, the colonoscope                            was passed under direct vision. Throughout the                            procedure, the patient's blood pressure, pulse, and                            oxygen saturations were monitored continuously. The                            [id number]-[id number] ([id number]) scope was introduced through                            the anus and advanced to the the cecum, identified                            by appendiceal orifice and ileocecal valve. The                            colonoscopy was performed without difficulty. The                            patient tolerated the  procedure well. The quality                            of the bowel preparation was evaluated using the                            BBPS [facility] Bowel Preparation Scale) with scores                            of: Right Colon = 2 (minor amount of residual                            staining, small fragments of stool and/or opaque                            liquid, but  mucosa seen well), Transverse Colon = 2                            (minor amount of residual staining, small fragments                            of stool and/or opaque liquid, but mucosa seen                            well) and Left Colon = 2 (minor amount of residual                            staining, small fragments of stool and/or opaque                            liquid, but mucosa seen well). The total BBPS score                            equals 6. The quality of the bowel preparation was                            fair. Scope In: 10:06:37 AM Scope Out: 10:22:26 AM Scope Withdrawal Time: 0 hours 9 minutes 59 seconds  Total Procedure Duration: 0 hours 15 minutes 49 seconds  Findings:      The perianal and digital rectal examinations were normal.      Non-bleeding internal hemorrhoids were found during endoscopy.      Multiple small-mouthed diverticula were found in the sigmoid colon.      Three sessile polyps were found in the transverse colon. The polyps were       2 mm in size. These polyps were removed with a cold biopsy forceps.       Resection and retrieval were complete.      A 6 mm polyp was found in the descending colon. The polyp was sessile.       The polyp was removed with a cold snare. Resection and retrieval were       complete. Impression:               - Preparation of the colon was  fair.                           - Non-bleeding internal hemorrhoids.                           - Diverticulosis in the sigmoid colon.                           - Three 2 mm polyps in the transverse colon,                            removed with a cold biopsy forceps. Resected and                            retrieved.                           - One 6 mm polyp in the descending colon, removed                            with a cold snare. Resected and retrieved. Moderate Sedation:      Per Anesthesia Care Recommendation:           - Patient has a contact number available for                             emergencies. The signs and symptoms of potential                            delayed complications were discussed with the                            patient. Return to normal activities tomorrow.                            Written discharge instructions were provided to the                            patient.                           - Resume previous diet.                           - Continue present medications.                           - Await pathology results.                           - Repeat colonoscopy in 5 years for surveillance.                           - Return to GI clinic PRN. Procedure Code(s):        --- Professional ---  45385, Colonoscopy, flexible; with removal of                            tumor(s), polyp(s), or other lesion(s) by snare                            technique                           45380, 5, Colonoscopy, flexible; with biopsy,                            single or multiple Diagnosis Code(s):        --- Professional ---                           K63.5, Polyp of colon                           Z86.010, Personal history of colonic polyps                           K64.8, Other hemorrhoids                           K57.30, Diverticulosis of large intestine without                            perforation or abscess without bleeding CPT copyright 2019 [employer] Medical Association. All rights reserved. The codes documented in this report are preliminary and upon coder review may  be revised to meet current compliance requirements. Elon Alas. Abbey Chatters, DO [place] Abbey Chatters, DO 04/26/2020 10:25:20 AM This report has been signed electronically. Number of Addenda: 0

## 2020-04-26 NOTE — H&P (Signed)
Primary Care Physician:  Fayrene Helper, MD Primary Gastroenterologist:  Dr. Abbey Chatters  Pre-Procedure History & Physical: HPI:  Matthew Adkins is a 70 y.o. male is here for a colonoscopy to be performed for surveillance purposes due to history of adenomatous colon polyps. Last TCS 3 years ago with multiple polyps removed.  Past Medical History:  Diagnosis Date  . Bladder cancer ([place]) 2000  . Depression   . Essential hypertension 01/16/2018  . Nicotine addiction   . Prostate cancer ([place]) 07/06/2014  . Seasonal allergies     Past Surgical History:  Procedure Laterality Date  . athroscopy of right knee  2002  . cancerous polyps removed from bladder  2000  . CATARACT EXTRACTION W/PHACO Left 03/01/2020   Procedure: CATARACT EXTRACTION PHACO AND INTRAOCULAR LENS PLACEMENT (IOC);  Surgeon: Baruch Goldmann, MD;  Location: [facility];  Service: Ophthalmology;  Laterality: Left;  CDE  4.96  . CATARACT EXTRACTION W/PHACO Right 03/22/2020   Procedure: CATARACT EXTRACTION PHACO AND INTRAOCULAR LENS PLACEMENT RIGHT EYE;  Surgeon: Baruch Goldmann, MD;  Location: [facility];  Service: Ophthalmology;  Laterality: Right;  CDE 5.78  . COLONOSCOPY  MJ 2009 pmhX: POLYPS   POLYP REMOVED BUT NOT RETRIEVED  . COLONOSCOPY  LS 2007 ARS   ? POLYPS, no path available  . COLONOSCOPY  09/26/2010   sessile poylp(2) pan-colonic diverticulosis,mild/internal hemorrhoids  . COLONOSCOPY WITH PROPOFOL N/A 02/09/2017   Procedure: COLONOSCOPY WITH PROPOFOL;  Surgeon: Danie Binder, MD;  Location: [facility];  Service: Endoscopy;  Laterality: N/A;  8:30am  . ESOPHAGOGASTRODUODENOSCOPY (EGD) WITH PROPOFOL N/A 02/09/2017   Procedure: ESOPHAGOGASTRODUODENOSCOPY (EGD) WITH PROPOFOL;  Surgeon: Danie Binder, MD;  Location: [facility];  Service: Endoscopy;  Laterality: N/A;  . FOOT SURGERY Bilateral 2004  . HERNIA REPAIR  1980's   left inguinal hernia  . LYMPHADENECTOMY Bilateral 09/03/2014   Procedure: PELVIC LYMPHADENECTOMY;   Surgeon: Raynelle Bring, MD;  Location: [facility];  Service: Urology;  Laterality: Bilateral;  . POLYPECTOMY  02/09/2017   Procedure: POLYPECTOMY;  Surgeon: Danie Binder, MD;  Location[facility];  Service: Endoscopy;;  cecal polyp, hepatic flexure polyps x3, transverse colon polyp, descending colon polyps x2, rectal polyps x2  . PROSTATE BIOPSY  07/06/14  . ROBOT ASSISTED LAPAROSCOPIC RADICAL PROSTATECTOMY N/A 09/03/2014   Procedure: ROBOTIC ASSISTED LAPAROSCOPIC RADICAL PROSTATECTOMY LEVEL 2;  Surgeon: Raynelle Bring, MD;  Location: [facility];  Service: Urology;  Laterality: N/A;    Prior to Admission medications   Medication Sig Start Date End Date Taking? Authorizing Provider  acetaminophen (TYLENOL) 500 MG tablet Take 1,000 mg by mouth every 6 (six) hours as needed for mild pain.   Yes [provider]  amLODipine (NORVASC) 10 MG tablet Take 0.5 tablets (5 mg total) by mouth daily. 03/27/20  Yes Fayrene Helper, MD  buPROPion [facility] SR) 150 MG 12 hr tablet Take 1 tablet (150 mg total) by mouth 2 (two) times daily. 03/27/20  Yes Fayrene Helper, MD  FLUoxetine (PROZAC) 20 MG capsule Take 1 capsule (20 mg total) by mouth daily. 03/27/20  Yes Fayrene Helper, MD  Multiple Vitamins-Minerals (ONE DAILY MENS 50+ MULTIVIT PO) Take 1 tablet by mouth daily.   Yes [provider]  omeprazole (PRILOSEC) 20 MG capsule Take 1 capsule (20 mg total) by mouth daily. TAKE 1 CAPSULE BY MOUTH 30 MINUTES PRIOR TO BREAKFAST. 03/07/20  Yes Carlis Stable, NP  polyethylene glycol-electrolytes (TRILYTE) 420 g solution Take 4,000  mLs by mouth as directed. 03/07/20  Yes Stefhanie Kachmar K, DO  simethicone (MYLICON) 80 MG chewable tablet TAKE (1) TABLET BY MOUTH ONCE DAILY. 04/08/20  Yes Lindell Spar, MD    Allergies as of 03/07/2020 - Review Complete 03/07/2020  Allergen Reaction Noted  . Bee venom  09/06/2011  . Codeine Nausea And Vomiting 01/03/2009    Family History  Problem Relation Age  of Onset  . Heart attack Father   . Diabetes Sister   . Hypertension Sister   . Hypertension Sister   . Aneurysm Sister        brain   . Arthritis Other        family history   . Colon cancer Neg Hx   . Colon polyps Neg Hx     Social History   Socioeconomic History  . Marital status: Married    Spouse name: Not on file  . Number of children: 2  . Years of education: Not on file  . Highest education level: Not on file  Occupational History  . Occupation: unemployed   Tobacco Use  . Smoking status: Current Every Day Smoker    Packs/day: 0.25    Years: 30.00    Pack years: 7.50    Types: Cigarettes  . Smokeless tobacco: Never Used  Vaping Use  . Vaping Use: Never used  Substance and Sexual Activity  . Alcohol use: Yes    Comment: 3 pints/month  . Drug use: Yes    Types: Marijuana    Comment: occassionally  . Sexual activity: Yes    Birth control/protection: None  Other Topics Concern  . Not on file  Social History Narrative  . Not on file   Social Determinants of Health   Financial Resource Strain: Not on file  Food Insecurity: Not on file  Transportation Needs: No Transportation Needs  . Lack of Transportation (Medical): No  . Lack of Transportation (Non-Medical): No  Physical Activity: Not on file  Stress: Not on file  Social Connections: Moderately Isolated  . Frequency of Communication with Friends and Family: Never  . Frequency of Social Gatherings with Friends and Family: Never  . Attends Religious Services: Never  . Active Member of Clubs or Organizations: Yes  . Attends Archivist Meetings: More than 4 times per year  . Marital Status: Married  Human resources officer Violence: Not on file    Review of Systems: See HPI, otherwise negative ROS  Physical Exam: Vital signs in last 24 hours: Temp:  [98.6 F (37 C)] 98.6 F (37 C) (04/22 0852) Pulse Rate:  [61] 61 (04/22 0852) Resp:  [17] 17 (04/22 0852) BP: (152)/(81) 152/81 (04/22  0852) SpO2:  [96 %] 96 % (04/22 0852) Weight:  [95.3 kg] 95.3 kg (04/22 0852)   General:   Alert,  Well-developed, well-nourished, pleasant and cooperative in NAD Head:  Normocephalic and atraumatic. Eyes:  Sclera clear, no icterus.   Conjunctiva pink. Ears:  Normal auditory acuity. Nose:  No deformity, discharge,  or lesions. Mouth:  No deformity or lesions, dentition normal. Neck:  Supple; no masses or thyromegaly. Lungs:  Clear throughout to auscultation.   No wheezes, crackles, or rhonchi. No acute distress. Heart:  Regular rate and rhythm; no murmurs, clicks, rubs,  or gallops. Abdomen:  Soft, nontender and nondistended. No masses, hepatosplenomegaly or hernias noted. Normal bowel sounds, without guarding, and without rebound.   Msk:  Symmetrical without gross deformities. Normal posture. Extremities:  Without clubbing or edema.  Neurologic:  Alert and  oriented x4;  grossly normal neurologically. Skin:  Intact without significant lesions or rashes. Cervical Nodes:  No significant cervical adenopathy. Psych:  Alert and cooperative. Normal mood and affect.  Impression/Plan: BRYCETON HANTZ is here for a colonoscopy to be performed for surveillance purposes due to history of adenomatous colon polyps.   The risks of the procedure including infection, bleed, or perforation as well as benefits, limitations, alternatives and imponderables have been reviewed with the patient. Questions have been answered. All parties agreeable.

## 2020-04-26 NOTE — Anesthesia Postprocedure Evaluation (Signed)
Anesthesia Post Note  Patient: Matthew Adkins  Procedure(s) Performed: COLONOSCOPY WITH PROPOFOL (N/A ) POLYPECTOMY  Patient location during evaluation: Phase II Anesthesia Type: General Level of consciousness: awake, oriented and awake and alert Pain management: satisfactory to patient Vital Signs Assessment: post-procedure vital signs reviewed and stable Respiratory status: spontaneous breathing, respiratory function stable and nonlabored ventilation Cardiovascular status: stable Postop Assessment: no apparent nausea or vomiting Anesthetic complications: no   No complications documented.   Last Vitals:  Vitals:   04/26/20 0852  BP: (!) 152/81  Pulse: 61  Resp: 17  Temp: 37 C  SpO2: 96%    Last Pain:  Vitals:   04/26/20 1003  TempSrc:   PainSc: 0-No pain                 Aarnav Steagall

## 2020-04-26 NOTE — Anesthesia Preprocedure Evaluation (Signed)
Anesthesia Evaluation  Patient identified by MRN, date of birth, ID band Patient awake    Reviewed: Allergy & Precautions, H&P , NPO status , Patient's Chart, lab work & pertinent test results, reviewed documented beta blocker date and time   Airway Mallampati: II  TM Distance: >3 FB Neck ROM: full    Dental no notable dental hx.    Pulmonary neg pulmonary ROS, Current Smoker,    Pulmonary exam normal breath sounds clear to auscultation       Cardiovascular Exercise Tolerance: Good hypertension, negative cardio ROS   Rhythm:regular Rate:Normal     Neuro/Psych  Headaches, PSYCHIATRIC DISORDERS Anxiety Depression    GI/Hepatic Neg liver ROS, GERD  Medicated,  Endo/Other  negative endocrine ROS  Renal/GU negative Renal ROS  negative genitourinary   Musculoskeletal   Abdominal   Peds  Hematology negative hematology ROS (+)   Anesthesia Other Findings   Reproductive/Obstetrics negative OB ROS                             Anesthesia Physical Anesthesia Plan  ASA: II  Anesthesia Plan: General   Post-op Pain Management:    Induction:   PONV Risk Score and Plan: Propofol infusion  Airway Management Planned:   Additional Equipment:   Intra-op Plan:   Post-operative Plan:   Informed Consent: I have reviewed the patients History and Physical, chart, labs and discussed the procedure including the risks, benefits and alternatives for the proposed anesthesia with the patient or authorized representative who has indicated his/her understanding and acceptance.     Dental Advisory Given  Plan Discussed with: CRNA  Anesthesia Plan Comments:         Anesthesia Quick Evaluation

## 2020-04-26 NOTE — Transfer of Care (Signed)
Immediate Anesthesia Transfer of Care Note  Patient: Matthew Adkins  Procedure(s) Performed: COLONOSCOPY WITH PROPOFOL (N/A ) POLYPECTOMY  Patient Location: PACU  Anesthesia Type:General  Level of Consciousness: awake, alert , oriented and patient cooperative  Airway & Oxygen Therapy: Patient Spontanous Breathing  Post-op Assessment: Report given to RN, Post -op Vital signs reviewed and stable and Patient moving all extremities X 4  Post vital signs: Reviewed and stable  Last Vitals:  Vitals Value Taken Time  BP    Temp    Pulse    Resp    SpO2      Last Pain:  Vitals:   04/26/20 1003  TempSrc:   PainSc: 0-No pain      Patients Stated Pain Goal: 7 (05/69/79 4801)  Complications: No complications documented.

## 2020-04-26 NOTE — Discharge Instructions (Addendum)
Colonoscopy Discharge Instructions  Read the instructions outlined below and refer to this sheet in the next few weeks. These discharge instructions provide you with general information on caring for yourself after you leave the hospital. Your doctor may also give you specific instructions. While your treatment has been planned according to the most current medical practices available, unavoidable complications occasionally occur.   ACTIVITY  You may resume your regular activity, but move at a slower pace for the next 24 hours.   Take frequent rest periods for the next 24 hours.   Walking will help get rid of the air and reduce the bloated feeling in your belly (abdomen).   No driving for 24 hours (because of the medicine (anesthesia) used during the test).    Do not sign any important legal documents or operate any machinery for 24 hours (because of the anesthesia used during the test).  NUTRITION  Drink plenty of fluids.   You may resume your normal diet as instructed by your doctor.   Begin with a light meal and progress to your normal diet. Heavy or fried foods are harder to digest and may make you feel sick to your stomach (nauseated).   Avoid alcoholic beverages for 24 hours or as instructed.  MEDICATIONS  You may resume your normal medications unless your doctor tells you otherwise.  WHAT YOU CAN EXPECT TODAY  Some feelings of bloating in the abdomen.   Passage of more gas than usual.   Spotting of blood in your stool or on the toilet paper.  IF YOU HAD POLYPS REMOVED DURING THE COLONOSCOPY:  No aspirin products for 7 days or as instructed.   No alcohol for 7 days or as instructed.   Eat a soft diet for the next 24 hours.  FINDING OUT THE RESULTS OF YOUR TEST Not all test results are available during your visit. If your test results are not back during the visit, make an appointment with your caregiver to find out the results. Do not assume everything is normal if  you have not heard from your caregiver or the medical facility. It is important for you to follow up on all of your test results.  SEEK IMMEDIATE MEDICAL ATTENTION IF:  You have more than a spotting of blood in your stool.   Your belly is swollen (abdominal distention).   You are nauseated or vomiting.   You have a temperature over 101.   You have abdominal pain or discomfort that is severe or gets worse throughout the day.   Your colonoscopy revealed 4 polyp(s) which I removed successfully. Await pathology results, my office will contact you. I recommend repeating colonoscopy in 5 years for surveillance purposes. Otherwise follow up with GI as needed.   I hope you have a great rest of your week!  Charles K. Carver, D.O. Gastroenterology and Hepatology [facility]   Colon Polyps  Colon polyps are tissue growths inside the colon, which is part of the large intestine. They are one of the types of polyps that can grow in the body. A polyp may be a round bump or a mushroom-shaped growth. You could have one polyp or more than one. Most colon polyps are noncancerous (benign). However, some colon polyps can become cancerous over time. Finding and removing the polyps early can help prevent this. What are the causes? The exact cause of colon polyps is not known. What increases the risk? The following factors may make you more likely to develop   this condition:  Having a family history of colorectal cancer or colon polyps.  Being older than 72 years of age.  Being younger than 72 years of age and having a significant family history of colorectal cancer or colon polyps or a genetic condition that puts you at higher risk of getting colon polyps.  Having inflammatory bowel disease, such as ulcerative colitis or Crohn's disease.  Having certain conditions passed from parent to child (hereditary conditions), such as: ? Familial adenomatous polyposis (FAP). ? Lynch  syndrome. ? Turcot syndrome. ? Peutz-Jeghers syndrome. ? MUTYH-associated polyposis (MAP).  Being overweight.  Certain lifestyle factors. These include smoking cigarettes, drinking too much alcohol, not getting enough exercise, and eating a diet that is high in fat and red meat and low in fiber.  Having had childhood cancer that was treated with radiation of the abdomen. What are the signs or symptoms? Many times, there are no symptoms. If you have symptoms, they may include:  Blood coming from the rectum during a bowel movement.  Blood in the stool (feces). The blood may be bright red or very dark in color.  Pain in the abdomen.  A change in bowel habits, such as constipation or diarrhea. How is this diagnosed? This condition is diagnosed with a colonoscopy. This is a procedure in which a lighted, flexible scope is inserted into the opening between the buttocks (anus) and then passed into the colon to examine the area. Polyps are sometimes found when a colonoscopy is done as part of routine cancer screening tests. How is this treated? This condition is treated by removing any polyps that are found. Most polyps can be removed during a colonoscopy. Those polyps will then be tested for cancer. Additional treatment may be needed depending on the results of testing. Follow these instructions at home: Eating and drinking  Eat foods that are high in fiber, such as fruits, vegetables, and whole grains.  Eat foods that are high in calcium and vitamin D, such as milk, cheese, yogurt, eggs, liver, fish, and broccoli.  Limit foods that are high in fat, such as fried foods and desserts.  Limit the amount of red meat, precooked or cured meat, or other processed meat that you eat, such as hot dogs, sausages, bacon, or meat loaves.  Limit sugary drinks.   Lifestyle  Maintain a healthy weight, or lose weight if recommended by your health care provider.  Exercise every day or as told by your  health care provider.  Do not use any products that contain nicotine or tobacco, such as cigarettes, e-cigarettes, and chewing tobacco. If you need help quitting, ask your health care provider.  Do not drink alcohol if: ? Your health care provider tells you not to drink. ? You are pregnant, may be pregnant, or are planning to become pregnant.  If you drink alcohol: ? Limit how much you use to:  0-1 drink a day for women.  0-2 drinks a day for men. ? Know how much alcohol is in your drink. In the [place] one drink equals one 12 oz bottle of beer (355 mL), one 5 oz glass of wine (148 mL), or one 1 oz glass of hard liquor (44 mL). General instructions  Take over-the-counter and prescription medicines only as told by your health care provider.  Keep all follow-up visits. This is important. This includes having regularly scheduled colonoscopies. Talk to your health care provider about when you need a colonoscopy. Contact a health care provider if:  You have new or worsening bleeding during a bowel movement.  You have new or increased blood in your stool.  You have a change in bowel habits.  You lose weight for no known reason. Summary  Colon polyps are tissue growths inside the colon, which is part of the large intestine. They are one type of polyp that can grow in the body.  Most colon polyps are noncancerous (benign), but some can become cancerous over time.  This condition is diagnosed with a colonoscopy.  This condition is treated by removing any polyps that are found. Most polyps can be removed during a colonoscopy. This information is not intended to replace advice given to you by your health care provider. Make sure you discuss any questions you have with your health care provider. Document Revised: 04/12/2019 Document Reviewed: 04/12/2019 Elsevier Patient Education  2021 [employer].

## 2020-04-29 LAB — SURGICAL PATHOLOGY

## 2020-05-01 ENCOUNTER — Encounter (HOSPITAL_COMMUNITY): Payer: Self-pay | Admitting: Internal Medicine

## 2020-05-01 DIAGNOSIS — H34812 Central retinal vein occlusion, left eye, with macular edema: Secondary | ICD-10-CM | POA: Diagnosis not present

## 2020-05-01 DIAGNOSIS — H59031 Cystoid macular edema following cataract surgery, right eye: Secondary | ICD-10-CM | POA: Diagnosis not present

## 2020-05-01 DIAGNOSIS — H43821 Vitreomacular adhesion, right eye: Secondary | ICD-10-CM | POA: Diagnosis not present

## 2020-05-01 DIAGNOSIS — H3562 Retinal hemorrhage, left eye: Secondary | ICD-10-CM | POA: Diagnosis not present

## 2020-05-06 NOTE — Progress Notes (Signed)
Dear Matthew Adkins,   The polyp(s) removed during your colonoscopy were tubular adenoma(s). We will need to repeat your colonoscopy in 5 years as you and Dr. Abbey Chatters had discussed postoperatively.   Please continue to follow-up with GI as needed. If you have any questions/concerns please call our office @ [phone].   Thank you, Matthew Adkins, CMA

## 2020-08-08 ENCOUNTER — Ambulatory Visit: Payer: Medicare PPO | Admitting: Orthopedic Surgery

## 2020-08-29 ENCOUNTER — Other Ambulatory Visit: Payer: Self-pay

## 2020-08-29 ENCOUNTER — Ambulatory Visit: Payer: Medicare PPO

## 2020-08-29 ENCOUNTER — Encounter: Payer: Self-pay | Admitting: Orthopedic Surgery

## 2020-08-29 ENCOUNTER — Ambulatory Visit: Payer: Medicare PPO | Admitting: Orthopedic Surgery

## 2020-08-29 VITALS — BP 154/87 | HR 78 | Ht 75.0 in | Wt 210.0 lb

## 2020-08-29 DIAGNOSIS — M170 Bilateral primary osteoarthritis of knee: Secondary | ICD-10-CM | POA: Diagnosis not present

## 2020-08-29 DIAGNOSIS — G8929 Other chronic pain: Secondary | ICD-10-CM | POA: Diagnosis not present

## 2020-08-29 DIAGNOSIS — M25551 Pain in right hip: Secondary | ICD-10-CM

## 2020-08-29 NOTE — Progress Notes (Signed)
Chief Complaint  Patient presents with   Knee Pain    Bilat knee pain    Right hip groin pain sometimes   70 year old male with chronic knee pain here for yearly checkup also complaining of some recent groin pain when he turns his leg out   Past Medical History:  Diagnosis Date   Bladder cancer ([place]) 2000   Depression    Essential hypertension 01/16/2018   Nicotine addiction    Prostate cancer ([place]) 07/06/2014   Seasonal allergies     BP (!) 154/87   Pulse 78   Ht '6\' 3"'$  (1.905 m)   Wt 210 lb (95.3 kg)   BMI 26.25 kg/m   The patient has bilateral varus knees he has a slowed gait with shortened stride length  He has slight flexion contractures in both knees he is noted to have fairly good flexion in both knees  As far as the hip goes he has excellent range of motion with pain only at terminal internal and external rotation no pain with flexion  X-rays of both knees show varus deformities grade 4 arthritis with secondary bone changes moderate to severe no major progression however  And then of the pelvis and right hip we see mild arthritis with good femoral head and good articulation and joint space   Encounter Diagnoses  Name Primary?   Bilateral chronic knee pain Yes   Primary osteoarthritis of both knees    Pain in right hip     No changes in condition at this time no other treatments needed at this time follow-up in a year

## 2020-09-02 ENCOUNTER — Other Ambulatory Visit: Payer: Self-pay | Admitting: Family Medicine

## 2020-10-03 ENCOUNTER — Encounter: Payer: Self-pay | Admitting: Family Medicine

## 2020-10-03 ENCOUNTER — Ambulatory Visit: Payer: Medicare PPO | Admitting: Family Medicine

## 2020-10-03 ENCOUNTER — Other Ambulatory Visit: Payer: Self-pay

## 2020-10-03 VITALS — BP 130/72 | HR 58 | Resp 16 | Ht 75.0 in | Wt 197.0 lb

## 2020-10-03 DIAGNOSIS — R7303 Prediabetes: Secondary | ICD-10-CM

## 2020-10-03 DIAGNOSIS — R0989 Other specified symptoms and signs involving the circulatory and respiratory systems: Secondary | ICD-10-CM | POA: Diagnosis not present

## 2020-10-03 DIAGNOSIS — F17200 Nicotine dependence, unspecified, uncomplicated: Secondary | ICD-10-CM

## 2020-10-03 DIAGNOSIS — Z0001 Encounter for general adult medical examination with abnormal findings: Secondary | ICD-10-CM

## 2020-10-03 DIAGNOSIS — E785 Hyperlipidemia, unspecified: Secondary | ICD-10-CM

## 2020-10-03 DIAGNOSIS — Z23 Encounter for immunization: Secondary | ICD-10-CM | POA: Diagnosis not present

## 2020-10-03 DIAGNOSIS — F17210 Nicotine dependence, cigarettes, uncomplicated: Secondary | ICD-10-CM

## 2020-10-03 DIAGNOSIS — Z0000 Encounter for general adult medical examination without abnormal findings: Secondary | ICD-10-CM

## 2020-10-03 MED ORDER — AMLODIPINE BESYLATE 10 MG PO TABS: 10.0000 mg | ORAL_TABLET | Freq: Every day | ORAL | 3 refills | Status: DC

## 2020-10-03 MED ORDER — BENZONATATE 100 MG PO CAPS: 100.0000 mg | ORAL_CAPSULE | Freq: Two times a day (BID) | ORAL | 0 refills | Status: DC | PRN

## 2020-10-03 MED ORDER — FLUOXETINE HCL 20 MG PO CAPS: 20.0000 mg | ORAL_CAPSULE | Freq: Every day | ORAL | 3 refills | Status: DC

## 2020-10-03 MED ORDER — PREDNISONE 5 MG PO TABS: 5.0000 mg | ORAL_TABLET | Freq: Two times a day (BID) | ORAL | 0 refills | Status: AC

## 2020-10-03 MED ORDER — BUPROPION HCL ER (SR) 150 MG PO TB12: 150.0000 mg | ORAL_TABLET | Freq: Two times a day (BID) | ORAL | 5 refills | Status: DC

## 2020-10-03 NOTE — Progress Notes (Signed)
   Matthew Adkins     MRN: [id number]      DOB: [birth year]   HPI: Patient is in for annual physical exam. No other health concerns are expressed or addressed at the visit. C/o chest congestion, feels tight,denies sputum , fever or chills Nicotine addiction counseling provided for 8 mins,and need to  f/u on getting screening chest scan done Counseled re need to quit smoking, he is trying but not at goal Immunization is reviewed , and  updated d.    PE; BP 130/72   Pulse (!) 58   Resp 16   Ht 6\' 3"  (1.905 m)   Wt 197 lb (89.4 kg)   SpO2 94%   BMI 24.62 kg/m   Pleasant male, alert and oriented x 3, in no cardio-pulmonary distress. Afebrile. HEENT No facial trauma or asymetry. Sinuses non tender. EOMI External ears normal,  Neck: supple, no adenopathy,JVD or thyromegaly.No bruits.  Chest: Clear to ascultation bilaterally.No crackles or wheezes. Non tender to palpation Decreased though adequate air entry  Cardiovascular system; Heart sounds normal,  S1 and  S2 ,no S3.  No murmur, or thrill. Apical beat not displaced Peripheral pulses normal.  Abdomen: Soft, non tender, no organomegaly or masses. No bruits. Bowel sounds normal. No guarding, tenderness or rebound.    Musculoskeletal exam: Decreased though adequate  ROM of spine, hips , shoulders and knees. No deformity ,swelling or crepitus noted. No muscle wasting or atrophy.   Neurologic: Cranial nerves 2 to 12 intact. Power, tone ,sensation and reflexes normal throughout. No disturbance in gait. No tremor.  Skin: Intact, no ulceration, erythema , scaling or rash noted. Pigmentation normal throughout  Psych; Normal mood and affect. Judgement and concentration normal   Assessment & Plan:   Annual physical exam Annual exam as documented. Counseling done  re healthy lifestyle involving commitment to 150 minutes exercise per week, heart healthy diet, and attaining healthy weight.The importance of  adequate sleep also discussed. Regular seat belt use and home safety, is also discussed. Changes in health habits are decided on by the patient with goals and time frames  set for achieving them. Immunization and cancer screening needs are specifically addressed at this visit.   Chest congestion predniosne and tessalon perles prescribed.Afebrileand no sputum  NICOTINE ADDICTION Asked:confirms currently smokes cigarettes 6 to 10/day Assess: Unwilling to set a quit date, but is cutting back Advise: needs to QUIT to reduce risk of cancer, cardio and cerebrovascular disease Assist: counseled for 5 minutes and literature provided Arrange: follow up in 2 to 4 months Screening chest scan past due, needs to be scheduled

## 2020-10-03 NOTE — Assessment & Plan Note (Signed)
Asked:confirms currently smokes cigarettes 6 to 10/day Assess: Unwilling to set a quit date, but is cutting back Advise: needs to QUIT to reduce risk of cancer, cardio and cerebrovascular disease Assist: counseled for 5 minutes and literature provided Arrange: follow up in 2 to 4 months Screening chest scan past due, needs to be scheduled

## 2020-10-03 NOTE — Assessment & Plan Note (Signed)

## 2020-10-03 NOTE — Assessment & Plan Note (Addendum)
predniosne and tessalon perles prescribed.Afebrileand no sputum

## 2020-10-03 NOTE — Patient Instructions (Addendum)
F/U in 5 months, call if you need me before  CBC , chem 7 and EGFr, HBA1C, TSH  today  Please schedule chest scan ordered in February  No medication change  Work on stopping smoking  Prednisone and tessalon perles are  prescribed for your cough and chest congestion  Nurse please  do vision screen, thanks  Flu vaccine today  Thanks for choosing [facility], we consider it a privelige to serve you.

## 2020-10-04 ENCOUNTER — Telehealth: Payer: Self-pay | Admitting: Family Medicine

## 2020-10-04 LAB — CBC
Hematocrit: 43.4 % (ref 37.5–51.0)
Hemoglobin: 14.7 g/dL (ref 13.0–17.7)
MCH: 30.6 pg (ref 26.6–33.0)
MCHC: 33.9 g/dL (ref 31.5–35.7)
MCV: 90 fL (ref 79–97)
Platelets: 256 10*3/uL (ref 150–450)
RBC: 4.8 x10E6/uL (ref 4.14–5.80)
RDW: 13.1 % (ref 11.6–15.4)
WBC: 5.2 10*3/uL (ref 3.4–10.8)

## 2020-10-04 LAB — BMP8+EGFR
BUN/Creatinine Ratio: 13 (ref 10–24)
BUN: 13 mg/dL (ref 8–27)
CO2: 24 mmol/L (ref 20–29)
Calcium: 8.7 mg/dL (ref 8.6–10.2)
Chloride: 104 mmol/L (ref 96–106)
Creatinine, Ser: 0.99 mg/dL (ref 0.76–1.27)
Glucose: 91 mg/dL (ref 70–99)
Potassium: 3.8 mmol/L (ref 3.5–5.2)
Sodium: 142 mmol/L (ref 134–144)
eGFR: 81 mL/min/{1.73_m2} (ref >59)

## 2020-10-04 LAB — HEMOGLOBIN A1C
Est. average glucose Bld gHb Est-mCnc: 128 mg/dL
Hgb A1c MFr Bld: 6.1 % — ABNORMAL HIGH (ref 4.8–5.6)

## 2020-10-04 LAB — TSH: TSH: 0.909 u[IU]/mL (ref 0.450–4.500)

## 2020-10-04 NOTE — Telephone Encounter (Signed)
Patient called to return nurse phone call

## 2020-10-08 NOTE — Telephone Encounter (Signed)
Pt has lab results- will try again to reach him

## 2020-12-10 ENCOUNTER — Other Ambulatory Visit: Payer: Self-pay

## 2020-12-10 NOTE — Telephone Encounter (Signed)
Patient is asking for a refill of Hydrocodone-Acetaminophen.  PATIENT USES [facility]

## 2020-12-10 NOTE — Telephone Encounter (Signed)
I don't think Dr Lemmie Evens normally prescribes this for him / pended

## 2020-12-11 DIAGNOSIS — H43813 Vitreous degeneration, bilateral: Secondary | ICD-10-CM | POA: Diagnosis not present

## 2020-12-11 DIAGNOSIS — H35031 Hypertensive retinopathy, right eye: Secondary | ICD-10-CM | POA: Diagnosis not present

## 2020-12-11 DIAGNOSIS — H34812 Central retinal vein occlusion, left eye, with macular edema: Secondary | ICD-10-CM | POA: Diagnosis not present

## 2020-12-11 DIAGNOSIS — H43821 Vitreomacular adhesion, right eye: Secondary | ICD-10-CM | POA: Diagnosis not present

## 2020-12-11 MED ORDER — HYDROCODONE-ACETAMINOPHEN 5-325 MG PO TABS
1.0000 | ORAL_TABLET | Freq: Four times a day (QID) | ORAL | 0 refills | Status: DC | PRN
Start: 2020-12-11 — End: 2021-02-13

## 2021-01-07 ENCOUNTER — Ambulatory Visit: Payer: Medicare PPO | Admitting: Family Medicine

## 2021-01-07 ENCOUNTER — Other Ambulatory Visit: Payer: Self-pay

## 2021-01-07 ENCOUNTER — Encounter: Payer: Self-pay | Admitting: Family Medicine

## 2021-01-07 VITALS — BP 150/80 | HR 63 | Ht 75.0 in | Wt 202.1 lb

## 2021-01-07 DIAGNOSIS — F17210 Nicotine dependence, cigarettes, uncomplicated: Secondary | ICD-10-CM

## 2021-01-07 DIAGNOSIS — F320 Major depressive disorder, single episode, mild: Secondary | ICD-10-CM

## 2021-01-07 DIAGNOSIS — F17200 Nicotine dependence, unspecified, uncomplicated: Secondary | ICD-10-CM

## 2021-01-07 DIAGNOSIS — R7303 Prediabetes: Secondary | ICD-10-CM | POA: Diagnosis not present

## 2021-01-07 DIAGNOSIS — F419 Anxiety disorder, unspecified: Secondary | ICD-10-CM

## 2021-01-07 DIAGNOSIS — E785 Hyperlipidemia, unspecified: Secondary | ICD-10-CM | POA: Diagnosis not present

## 2021-01-07 DIAGNOSIS — K4090 Unilateral inguinal hernia, without obstruction or gangrene, not specified as recurrent: Secondary | ICD-10-CM | POA: Diagnosis not present

## 2021-01-07 DIAGNOSIS — I10 Essential (primary) hypertension: Secondary | ICD-10-CM | POA: Diagnosis not present

## 2021-01-07 MED ORDER — OLMESARTAN MEDOXOMIL-HCTZ 20-12.5 MG PO TABS: 1.0000 | ORAL_TABLET | Freq: Every day | ORAL | 3 refills | Status: DC

## 2021-01-07 MED ORDER — BUPROPION HCL ER (SR) 150 MG PO TB12: 150.0000 mg | ORAL_TABLET | Freq: Two times a day (BID) | ORAL | 3 refills | Status: DC

## 2021-01-07 NOTE — Assessment & Plan Note (Signed)
Controlled, no change in medication  

## 2021-01-07 NOTE — Assessment & Plan Note (Addendum)
Asked:confirms currently smokes cigarettes Assess: willing to set a quit date, and  is cutting back, resume wellbutrin Advise: needs to QUIT to reduce risk of cancer, cardio and cerebrovascular disease Assist: counseled for 5 minutes and literature provided Arrange: follow up in 2 to 4 months Screening chest scan past due will schedule, states will keep appt

## 2021-01-07 NOTE — Assessment & Plan Note (Signed)
Patient educated about the importance of limiting  Carbohydrate intake , the need to commit to daily physical activity for a minimum of 30 minutes , and to commit weight loss. The fact that changes in all these areas will reduce or eliminate all together the development of diabetes is stressed.   Diabetic Labs Latest Ref Rng & Units 10/03/2020 03/07/2020 11/27/2019 11/10/2019 11/09/2019  HbA1c 4.8 - 5.6 % 6.1(H) 6.1(H) - - -  Chol 100 - 199 mg/dL - 177 - - -  HDL >39 mg/dL - 56 - - -  Calc LDL 0 - 99 mg/dL - 110(H) - - -  Triglycerides 0 - 149 mg/dL - 54 - - -  Creatinine 0.76 - 1.27 mg/dL 0.99 - 1.04 0.98 0.94   BP/Weight 01/07/2021 10/03/2020 08/29/2020 04/26/2020 04/11/2020 03/27/2020 4/48/1856  Systolic BP 314 970 263 785 885 027 741  Diastolic BP 80 72 87 63 82 82 82  Wt. (Lbs) 202.08 197 210 210 - 200 -  BMI 25.26 24.62 26.25 26.25 - 25 -   No flowsheet data found.  Updated lab needed at/ before next visit.

## 2021-01-07 NOTE — Progress Notes (Signed)
Matthew Adkins     MRN: [id number]      DOB: 1950-08-10   HPI Matthew Adkins is here for follow up and re-evaluation of chronic medical conditions, medication management and review of any available recent lab and radiology data.  Preventive health is updated, specifically  Cancer screening and Immunization.   Questions or concerns regarding consultations or procedures which the PT has had in the interim are  addressed. The PT denies any adverse reactions to current medications since the last visit.  Three month h/o painless right groin swelling which goes and comes, no incitng trauma Smokes about 5 cigg/ day wants to quit  ROS Denies recent fever or chills. Denies sinus pressure, nasal congestion, ear pain or sore throat. Denies chest congestion, productive cough or wheezing. Denies chest pains, palpitations and leg swelling Denies abdominal pain, nausea, vomiting,diarrhea or constipation.   Denies dysuria, frequency, hesitancy or incontinence. Denies joint pain, swelling and limitation in mobility. Denies headaches, seizures, numbness, or tingling. Denies uncontrolled depression, anxiety or insomnia. Denies skin break down or rash.   PE  BP (!) 150/80    Pulse 63    Ht 6\' 3"  (1.905 m)    Wt 202 lb 1.3 oz (91.7 kg)    SpO2 93%    BMI 25.26 kg/m   Patient alert and oriented and in no cardiopulmonary distress.  HEENT: No facial asymmetry, EOMI,     Neck supple .  Chest: Clear to auscultation bilaterally.decreased air entry  CVS: S1, S2 no murmurs, no S3.Regular rate.  ABD: Soft non tender. Right inguinal hernia palpable  Ext: No edema  MS: Adequate ROM spine, shoulders, hips and knees.  Skin: Intact, no ulcerations or rash noted.  Psych: Good eye contact, normal affect. Memory intact not anxious or depressed appearing.  CNS: CN 2-12 intact, power,  normal throughout.no focal deficits noted.   Assessment & Plan  Essential hypertension Uncontrolled, med adjustment  made DASH diet and commitment to daily physical activity for a minimum of 30 minutes discussed and encouraged, as a part of hypertension management. The importance of attaining a healthy weight is also discussed.  BP/Weight 01/07/2021 10/03/2020 08/29/2020 04/26/2020 04/11/2020 03/27/2020 05/13/3265  Systolic BP 124 580 998 338 250 539 767  Diastolic BP 80 72 87 63 82 82 82  Wt. (Lbs) 202.08 197 210 210 - 200 -  BMI 25.26 24.62 26.25 26.25 - 25 -       Prediabetes Patient educated about the importance of limiting  Carbohydrate intake , the need to commit to daily physical activity for a minimum of 30 minutes , and to commit weight loss. The fact that changes in all these areas will reduce or eliminate all together the development of diabetes is stressed.   Diabetic Labs Latest Ref Rng & Units 10/03/2020 03/07/2020 11/27/2019 11/10/2019 11/09/2019  HbA1c 4.8 - 5.6 % 6.1(H) 6.1(H) - - -  Chol 100 - 199 mg/dL - 177 - - -  HDL >39 mg/dL - 56 - - -  Calc LDL 0 - 99 mg/dL - 110(H) - - -  Triglycerides 0 - 149 mg/dL - 54 - - -  Creatinine 0.76 - 1.27 mg/dL 0.99 - 1.04 0.98 0.94   BP/Weight 01/07/2021 10/03/2020 08/29/2020 04/26/2020 04/11/2020 03/27/2020 3/41/9379  Systolic BP 024 097 353 299 242 683 419  Diastolic BP 80 72 87 63 82 82 82  Wt. (Lbs) 202.08 197 210 210 - 200 -  BMI 25.26 24.62 26.25 26.25 -  25 -   No flowsheet data found.  Updated lab needed at/ before next visit.   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

## 2021-01-07 NOTE — Patient Instructions (Addendum)
F/U in 8 weeks, re evaluate b;lood pressure ansd smoking  Please schedule abdominal scan and chest scan and let pt know  New additional medication for blood pressure  is benicar/hctz 20/12.5 one daily, continue amlodipine as before  Plan to quit smoking by 01/21/2021, start twice daily wellbutrin  Fasting lipid, cmp and EGFR, hBA1C 1 week before  follow up  Thanks for choosing [facility], we consider it a privelige to serve you.

## 2021-01-07 NOTE — Assessment & Plan Note (Signed)
Uncontrolled, med adjustment made DASH diet and commitment to daily physical activity for a minimum of 30 minutes discussed and encouraged, as a part of hypertension management. The importance of attaining a healthy weight is also discussed.  BP/Weight 01/07/2021 10/03/2020 08/29/2020 04/26/2020 04/11/2020 03/27/2020 3/79/0240  Systolic BP 973 532 992 426 834 196 222  Diastolic BP 80 72 87 63 82 82 82  Wt. (Lbs) 202.08 197 210 210 - 200 -  BMI 25.26 24.62 26.25 26.25 - 25 -

## 2021-01-07 NOTE — Assessment & Plan Note (Signed)
Symptomatic x 3 months, obtain scan and refer to gen surgery

## 2021-02-05 ENCOUNTER — Encounter (HOSPITAL_COMMUNITY): Payer: Self-pay | Admitting: Radiology

## 2021-02-05 ENCOUNTER — Other Ambulatory Visit: Payer: Self-pay

## 2021-02-05 ENCOUNTER — Ambulatory Visit (HOSPITAL_COMMUNITY)
Admission: RE | Admit: 2021-02-05 | Discharge: 2021-02-05 | Disposition: A | Discharge status: Home / Self Care | Payer: Medicare PPO | Source: Ambulatory Visit | Attending: Family Medicine | Admitting: Family Medicine

## 2021-02-05 DIAGNOSIS — N281 Cyst of kidney, acquired: Secondary | ICD-10-CM | POA: Diagnosis not present

## 2021-02-05 DIAGNOSIS — K4090 Unilateral inguinal hernia, without obstruction or gangrene, not specified as recurrent: Secondary | ICD-10-CM | POA: Insufficient documentation

## 2021-02-05 DIAGNOSIS — N3289 Other specified disorders of bladder: Secondary | ICD-10-CM | POA: Diagnosis not present

## 2021-02-05 DIAGNOSIS — K5900 Constipation, unspecified: Secondary | ICD-10-CM | POA: Diagnosis not present

## 2021-02-05 LAB — POCT I-STAT CREATININE: Creatinine, Ser: 1.3 mg/dL — ABNORMAL HIGH (ref 0.61–1.24)

## 2021-02-05 MED ORDER — IOHEXOL 300 MG/ML  SOLN
100.0000 mL | Freq: Once | INTRAMUSCULAR | Status: AC | PRN
  Administered 2021-02-05: 100 mL via INTRAVENOUS

## 2021-02-12 ENCOUNTER — Ambulatory Visit (INDEPENDENT_AMBULATORY_CARE_PROVIDER_SITE_OTHER): Payer: Medicare PPO | Admitting: Family Medicine

## 2021-02-12 ENCOUNTER — Other Ambulatory Visit: Payer: Self-pay

## 2021-02-12 ENCOUNTER — Encounter: Payer: Self-pay | Admitting: Family Medicine

## 2021-02-12 DIAGNOSIS — N3289 Other specified disorders of bladder: Secondary | ICD-10-CM | POA: Diagnosis not present

## 2021-02-12 DIAGNOSIS — K4090 Unilateral inguinal hernia, without obstruction or gangrene, not specified as recurrent: Secondary | ICD-10-CM | POA: Diagnosis not present

## 2021-02-12 DIAGNOSIS — I7143 Infrarenal abdominal aortic aneurysm, without rupture: Secondary | ICD-10-CM | POA: Diagnosis not present

## 2021-02-12 DIAGNOSIS — C679 Malignant neoplasm of bladder, unspecified: Secondary | ICD-10-CM

## 2021-02-12 DIAGNOSIS — Z8551 Personal history of malignant neoplasm of bladder: Secondary | ICD-10-CM | POA: Diagnosis not present

## 2021-02-12 DIAGNOSIS — I719 Aortic aneurysm of unspecified site, without rupture: Secondary | ICD-10-CM | POA: Insufficient documentation

## 2021-02-12 NOTE — Assessment & Plan Note (Signed)
Refer to vascular for surveillance, counselled pt to quit smoking

## 2021-02-12 NOTE — Assessment & Plan Note (Signed)
Recent scan shows abn bladder wall thickenings refer urgently to Urology to evaluate

## 2021-02-12 NOTE — Patient Instructions (Signed)
F/U as before, call if you need me sooner  Urgent referral to Urology  You are referred to Surgery and to vascular surgeon as we discussed based on the scan report  Please worrk on quitting smoking  Thanks for choosing [facility], we consider it a privelige to serve you.

## 2021-02-12 NOTE — Progress Notes (Signed)
Virtual Visit via Telephone Note  I connected with Matthew Adkins on 02/12/21 at 11:40 AM EST by telephone and verified that I am speaking with the correct person using two identifiers.  Location: Patient: home Provider: office   I discussed the limitations, risks, security and privacy concerns of performing an evaluation and management service by telephone and the availability of in person appointments. I also discussed with the patient that there may be a patient responsible charge related to this service. The patient expressed understanding and agreed to proceed.   History of Present Illness: Visit to discuss multiple abn on recent scan and necessary follow up's . All questions are answered   Observations/Objective: There were no vitals taken for this visit. Good communication with no confusion and intact memory. Alert and oriented x 3 No signs of respiratory distress during speech   Assessment and Plan:  Right inguinal hernia Refer for surgery, large and needs repair, noi obstruction currently  Malignant neoplasm of bladder ([place]) Recent scan shows abn bladder wall thickenings refer urgently to Urology to evaluate  Aortic aneurysm [facility]) Refer to vascular for surveillance, counselled pt to quit smoking  Follow Up Instructions:    I discussed the assessment and treatment plan with the patient. The patient was provided an opportunity to ask questions and all were answered. The patient agreed with the plan and demonstrated an understanding of the instructions.   The patient was advised to call back or seek an in-person evaluation if the symptoms worsen or if the condition fails to improve as anticipated.  I provided 12 minutes of non-face-to-face time during this encounter.   Matthew Nakayama, MD

## 2021-02-12 NOTE — Assessment & Plan Note (Addendum)
Refer for surgery, large and needs repair, noi obstruction currently

## 2021-02-13 ENCOUNTER — Emergency Department (HOSPITAL_COMMUNITY): Payer: Medicare PPO

## 2021-02-13 ENCOUNTER — Telehealth: Payer: Self-pay | Admitting: *Deleted

## 2021-02-13 ENCOUNTER — Other Ambulatory Visit: Payer: Self-pay | Admitting: Orthopedic Surgery

## 2021-02-13 ENCOUNTER — Telehealth: Payer: Self-pay

## 2021-02-13 ENCOUNTER — Emergency Department (HOSPITAL_COMMUNITY)
Admission: EM | Admit: 2021-02-13 | Discharge: 2021-02-13 | Disposition: A | Discharge status: Home / Self Care | Payer: Medicare PPO | Attending: Emergency Medicine | Admitting: Emergency Medicine

## 2021-02-13 ENCOUNTER — Encounter (HOSPITAL_COMMUNITY): Payer: Self-pay | Admitting: *Deleted

## 2021-02-13 DIAGNOSIS — K4090 Unilateral inguinal hernia, without obstruction or gangrene, not specified as recurrent: Secondary | ICD-10-CM | POA: Diagnosis not present

## 2021-02-13 DIAGNOSIS — Z79899 Other long term (current) drug therapy: Secondary | ICD-10-CM | POA: Insufficient documentation

## 2021-02-13 DIAGNOSIS — K7689 Other specified diseases of liver: Secondary | ICD-10-CM | POA: Diagnosis not present

## 2021-02-13 DIAGNOSIS — K4030 Unilateral inguinal hernia, with obstruction, without gangrene, not specified as recurrent: Secondary | ICD-10-CM

## 2021-02-13 DIAGNOSIS — Z8551 Personal history of malignant neoplasm of bladder: Secondary | ICD-10-CM | POA: Insufficient documentation

## 2021-02-13 DIAGNOSIS — I10 Essential (primary) hypertension: Secondary | ICD-10-CM | POA: Insufficient documentation

## 2021-02-13 DIAGNOSIS — R10813 Right lower quadrant abdominal tenderness: Secondary | ICD-10-CM | POA: Diagnosis present

## 2021-02-13 DIAGNOSIS — N281 Cyst of kidney, acquired: Secondary | ICD-10-CM | POA: Diagnosis not present

## 2021-02-13 DIAGNOSIS — R1031 Right lower quadrant pain: Secondary | ICD-10-CM | POA: Diagnosis not present

## 2021-02-13 DIAGNOSIS — K5900 Constipation, unspecified: Secondary | ICD-10-CM | POA: Diagnosis not present

## 2021-02-13 DIAGNOSIS — K4091 Unilateral inguinal hernia, without obstruction or gangrene, recurrent: Secondary | ICD-10-CM

## 2021-02-13 LAB — URINALYSIS, ROUTINE W REFLEX MICROSCOPIC
Bilirubin Urine: NEGATIVE
Glucose, UA: NEGATIVE mg/dL
Hgb urine dipstick: NEGATIVE
Ketones, ur: NEGATIVE mg/dL
Leukocytes,Ua: NEGATIVE
Nitrite: NEGATIVE
Protein, ur: NEGATIVE mg/dL
Specific Gravity, Urine: 1.033 — ABNORMAL HIGH (ref 1.005–1.030)
pH: 7 (ref 5.0–8.0)

## 2021-02-13 LAB — COMPREHENSIVE METABOLIC PANEL
ALT: 17 U/L (ref 0–44)
AST: 18 U/L (ref 15–41)
Albumin: 3.7 g/dL (ref 3.5–5.0)
Alkaline Phosphatase: 56 U/L (ref 38–126)
Anion gap: 8 (ref 5–15)
BUN: 11 mg/dL (ref 8–23)
CO2: 24 mmol/L (ref 22–32)
Calcium: 8.5 mg/dL — ABNORMAL LOW (ref 8.9–10.3)
Chloride: 101 mmol/L (ref 98–111)
Creatinine, Ser: 0.96 mg/dL (ref 0.61–1.24)
GFR, Estimated: >60 mL/min (ref >60)
Glucose, Bld: 107 mg/dL — ABNORMAL HIGH (ref 70–99)
Potassium: 3.4 mmol/L — ABNORMAL LOW (ref 3.5–5.1)
Sodium: 133 mmol/L — ABNORMAL LOW (ref 135–145)
Total Bilirubin: 1.1 mg/dL (ref 0.3–1.2)
Total Protein: 6.8 g/dL (ref 6.5–8.1)

## 2021-02-13 LAB — CBC WITH DIFFERENTIAL/PLATELET
Abs Immature Granulocytes: 0.01 10*3/uL (ref 0.00–0.07)
Basophils Absolute: 0 10*3/uL (ref 0.0–0.1)
Basophils Relative: 1 %
Eosinophils Absolute: 0 10*3/uL (ref 0.0–0.5)
Eosinophils Relative: 1 %
HCT: 42.5 % (ref 39.0–52.0)
Hemoglobin: 14.3 g/dL (ref 13.0–17.0)
Immature Granulocytes: 0 %
Lymphocytes Relative: 20 %
Lymphs Abs: 1 10*3/uL (ref 0.7–4.0)
MCH: 31.6 pg (ref 26.0–34.0)
MCHC: 33.6 g/dL (ref 30.0–36.0)
MCV: 93.8 fL (ref 80.0–100.0)
Monocytes Absolute: 0.4 10*3/uL (ref 0.1–1.0)
Monocytes Relative: 8 %
Neutro Abs: 3.7 10*3/uL (ref 1.7–7.7)
Neutrophils Relative %: 70 %
Platelets: 233 10*3/uL (ref 150–400)
RBC: 4.53 MIL/uL (ref 4.22–5.81)
RDW: 13.1 % (ref 11.5–15.5)
WBC: 5.3 10*3/uL (ref 4.0–10.5)
nRBC: 0 % (ref 0.0–0.2)

## 2021-02-13 LAB — LACTIC ACID, PLASMA
Lactic Acid, Venous: 1 mmol/L (ref 0.5–1.9)
Lactic Acid, Venous: 0.8 mmol/L (ref 0.5–1.9)

## 2021-02-13 LAB — LIPASE, BLOOD: Lipase: 23 U/L (ref 11–51)

## 2021-02-13 MED ORDER — IOHEXOL 300 MG/ML  SOLN
100.0000 mL | Freq: Once | INTRAMUSCULAR | Status: AC | PRN
  Administered 2021-02-13: 100 mL via INTRAVENOUS

## 2021-02-13 MED ORDER — POLYETHYLENE GLYCOL 3350 17 G PO PACK
17.0000 g | PACK | Freq: Every day | ORAL | 0 refills | Status: AC
Start: 2021-02-13 — End: ?

## 2021-02-13 MED ORDER — MORPHINE SULFATE (PF) 4 MG/ML IV SOLN
4.0000 mg | Freq: Once | INTRAVENOUS | Status: AC
  Administered 2021-02-13: 4 mg via INTRAVENOUS
  Filled 2021-02-13: qty 1

## 2021-02-13 MED ORDER — MORPHINE SULFATE (PF) 4 MG/ML IV SOLN
4.0000 mg | Freq: Once | INTRAVENOUS | Status: DC
  Filled 2021-02-13: qty 1

## 2021-02-13 MED ORDER — HYDROCODONE-ACETAMINOPHEN 5-325 MG PO TABS: 1.0000 | ORAL_TABLET | Freq: Four times a day (QID) | ORAL | 0 refills | Status: DC | PRN

## 2021-02-13 MED ORDER — ONDANSETRON HCL 4 MG/2ML IJ SOLN
4.0000 mg | Freq: Once | INTRAMUSCULAR | Status: AC
  Administered 2021-02-13: 4 mg via INTRAVENOUS
  Filled 2021-02-13: qty 2

## 2021-02-13 NOTE — ED Notes (Signed)
Pt complaining of burning in groin area. PA made aware.

## 2021-02-13 NOTE — ED Notes (Signed)
Patient transported to CT 

## 2021-02-13 NOTE — ED Provider Notes (Signed)
[facility] EMERGENCY DEPARTMENT Provider Note   CSN: [id number] Arrival date & time: 02/13/21  1543     History  Chief Complaint  Patient presents with   Abdominal Pain    Matthew Adkins is a 71 y.o. male with past medical history of malignant neoplasm of bladder, infrarenal AAA, right inguinal hernia, hypertension, prediabetes, dyslipidemia.  Presents to the emergency department with a chief complaint of pain to right groin.  Patient states that he has had a right inguinal hernia over the last 6 months.  Patient has had no change in size over this time.  Patient had CT imaging performed on 02/05/2021.  Patient states that he has not had any pain associated with this hernia until today.  Patient reports that pain has been constant and progressively worsening. Patient rates pain 6/10 on the pain scale.  Pain is worse with touch and movement.  Patient has had no improvement in pain with Tylenol.  Patient states that he has not had a bowel movement since Tuesday.  Denies any nausea, vomiting diarrhea, blood in stool, melena, abdominal distention, dysuria, hematuria, urinary frequency, penile discharge, fever, chills.     Abdominal Pain Associated symptoms: constipation   Associated symptoms: no chest pain, no chills, no diarrhea, no dysuria, no fever, no hematuria, no nausea, no shortness of breath and no vomiting       Home Medications Prior to Admission medications   Medication Sig Start Date End Date Taking? Authorizing Provider  acetaminophen (TYLENOL) 500 MG tablet Take 1,000 mg by mouth every 6 (six) hours as needed for mild pain.    [provider]  amLODipine (NORVASC) 10 MG tablet Take 1 tablet (10 mg total) by mouth daily. 10/03/20   Fayrene Helper, MD  benzonatate (TESSALON) 100 MG capsule Take 1 capsule (100 mg total) by mouth 2 (two) times daily as needed for cough. 10/03/20   Fayrene Helper, MD  buPROPion (WELLBUTRIN SR) 150 MG 12 hr tablet Take 1 tablet  (150 mg total) by mouth 2 (two) times daily. 01/07/21   Fayrene Helper, MD  FLUoxetine (PROZAC) 20 MG capsule Take 1 capsule (20 mg total) by mouth daily. 10/03/20   Fayrene Helper, MD  HYDROcodone-acetaminophen (NORCO/VICODIN) 5-325 MG tablet Take 1 tablet by mouth every 6 (six) hours as needed for moderate pain. 12/11/20   Carole Civil, MD  Multiple Vitamins-Minerals (ONE DAILY MENS 50+ MULTIVIT PO) Take 1 tablet by mouth daily.    [provider]  olmesartan-hydrochlorothiazide (BENICAR HCT) 20-12.5 MG tablet Take 1 tablet by mouth daily. 01/07/21   Fayrene Helper, MD  omeprazole (PRILOSEC) 20 MG capsule Take 1 capsule (20 mg total) by mouth daily. TAKE 1 CAPSULE BY MOUTH 30 MINUTES PRIOR TO BREAKFAST. 03/07/20   Carlis Stable, NP      Allergies    Bee venom and Codeine    Review of Systems   Review of Systems  Constitutional:  Negative for chills and fever.  Eyes:  Negative for visual disturbance.  Respiratory:  Negative for shortness of breath.   Cardiovascular:  Negative for chest pain.  Gastrointestinal:  Positive for abdominal pain and constipation. Negative for abdominal distention, anal bleeding, blood in stool, diarrhea, nausea, rectal pain and vomiting.  Genitourinary:  Negative for difficulty urinating, dysuria, flank pain, genital sores, hematuria, penile discharge, penile pain, penile swelling, scrotal swelling and testicular pain.  Musculoskeletal:  Negative for back pain and neck pain.  Skin:  Negative  for color change and rash.  Neurological:  Negative for dizziness, syncope, light-headedness and headaches.  Psychiatric/Behavioral:  Negative for confusion.    Physical Exam Updated Vital Signs BP 126/79    Pulse (!) 59    Temp 98.1 F (36.7 C) (Oral)    Resp 18    Ht 6\' 3"  (1.905 m)    Wt 89 kg    SpO2 93%    BMI 24.52 kg/m  Physical Exam Vitals and nursing note reviewed.  Constitutional:      General: He is not in acute distress.    Appearance:  He is not ill-appearing, toxic-appearing or diaphoretic.  HENT:     Head: Normocephalic.  Eyes:     General: No scleral icterus.       Right eye: No discharge.        Left eye: No discharge.  Cardiovascular:     Rate and Rhythm: Normal rate.  Pulmonary:     Effort: Pulmonary effort is normal.  Abdominal:     General: Abdomen is flat. A surgical scar is present. Bowel sounds are increased. There is no distension.     Palpations: Abdomen is soft. There is no mass or pulsatile mass.     Tenderness: There is no abdominal tenderness. There is no guarding or rebound.     Hernia: A hernia is present. Hernia is present in the right inguinal area. There is no hernia in the umbilical area, ventral area or left inguinal area.  Genitourinary:    Testes: Normal. Cremasteric reflex is present.     Epididymis:     Right: Normal.     Left: Normal.     Comments: Large right inguinal hernia cannot be reduced. Skin:    General: Skin is warm and dry.  Neurological:     General: No focal deficit present.     Mental Status: He is alert.  Psychiatric:        Behavior: Behavior is cooperative.    ED Results / Procedures / Treatments   Labs (all labs ordered are listed, but only abnormal results are displayed) Labs Reviewed  COMPREHENSIVE METABOLIC PANEL - Abnormal; Notable for the following components:      Result Value   Sodium 133 (*)    Potassium 3.4 (*)    Glucose, Bld 107 (*)    Calcium 8.5 (*)    All other components within normal limits  URINALYSIS, ROUTINE W REFLEX MICROSCOPIC - Abnormal; Notable for the following components:   Specific Gravity, Urine 1.033 (*)    All other components within normal limits  URINE CULTURE  CBC WITH DIFFERENTIAL/PLATELET  LIPASE, BLOOD  LACTIC ACID, PLASMA  LACTIC ACID, PLASMA    EKG None  Radiology CT ABDOMEN PELVIS W CONTRAST  Result Date: 02/13/2021 CLINICAL DATA:  Right lower quadrant pain since yesterday. Bowel obstruction suspected.  Inguinal hernia. History of prostate and bladder cancer. EXAM: CT ABDOMEN AND PELVIS WITH CONTRAST TECHNIQUE: Multidetector CT imaging of the abdomen and pelvis was performed using the standard protocol following bolus administration of intravenous contrast. RADIATION DOSE REDUCTION: This exam was performed according to the departmental dose-optimization program which includes automated exposure control, adjustment of the mA and/or kV according to patient size and/or use of iterative reconstruction technique. CONTRAST:  185mL OMNIPAQUE IOHEXOL 300 MG/ML  SOLN COMPARISON:  02/05/2021 FINDINGS: Lower chest: Emphysematous changes in the lung bases. No focal consolidation. Hepatobiliary: Multiple hepatic cysts are unchanged since prior study. Gallbladder and bile ducts are unremarkable.  Pancreas: Unremarkable. No pancreatic ductal dilatation or surrounding inflammatory changes. Spleen: Normal in size without focal abnormality. Adrenals/Urinary Tract: Multiple renal cysts are unchanged since prior study. No follow-up is indicated. Nephrograms are symmetrical. No hydronephrosis or hydroureter. No adrenal gland nodules. Bladder is unremarkable. Stomach/Bowel: Large right inguinal hernia containing a portion of the sigmoid colon and fluid. On the previous study, the hernia contained terminal ileum. Possibly the hernia has been reduced in the interval and has recurred? No definite evidence of proximal obstruction although the proximal colon is diffusely stool-filled without significant dilatation. No wall thickening or inflammatory changes. Small bowel are decompressed. Appendix is normal. Vascular/Lymphatic: Diffuse calcific and noncalcific plaque formation throughout the aortoiliac vessels. Infrarenal abdominal aortic aneurysm again demonstrated measuring 3.3 cm diameter. Bilateral iliac artery aneurysms, 1.8 cm on the right and 1.8 cm on the left. No change since previous study. No significant lymphadenopathy.  Reproductive: Prostate gland is surgically absent. Other: No free air or free fluid in the abdomen. Musculoskeletal: Degenerative changes in the spine. A few scattered tiny sclerotic bone lesions are demonstrated, unchanged since previous study from 07/18/2014. Degenerative changes in the hips. IMPRESSION: 1. Persistent finding of a large right inguinal hernia containing a portion of the sigmoid colon and some fluid. No evidence of proximal dilatation although the proximal colon is diffusely stool-filled, which may indicate degree of obstruction. 2. Unchanged appearance of abdominal aortic and iliac artery aneurysms. Recommend follow-up ultrasound every 3 years. This recommendation follows ACR consensus guidelines: White Paper of the ACR Incidental Findings Committee II on Vascular Findings. J Am Coll Radiol 2013; 10:789-794. 3. Aortic atherosclerosis. 4. Multiple benign-appearing renal and hepatic cyst. No follow-up is indicated. Electronically Signed   By: Lucienne Capers M.D.   On: 02/13/2021 18:16    Procedures Procedures    Medications Ordered in ED Medications - No data to display  ED Course/ Medical Decision Making/ A&P Clinical Course as of 02/13/21 2031  Thu Feb 13, 2021  1917 Spoke to Dr. Arnoldo Morale who will see the patient for evaluation of the emergency department [PB]    Clinical Course User Index [PB] Loni Beckwith, PA-C                           Medical Decision Making Amount and/or Complexity of Data Reviewed Labs: ordered. Radiology: ordered.  Risk Prescription drug management.   Alert 71 year old male in no acute distress, nontoxic-appearing.  Presents emergency department with a complaint of right groin pain.  Information was obtained from patient.  Old records were reviewed including previous provider notes, labs and imaging.  See HPI for medical history that complicates patient's care.  Physical exam patient has hernia to right inguinal area.  Unable to  reduce with patient in elevated position and constant pressure.  Due to reports of new onset of pain related to his hernia will obtain CT imaging to evaluate for strangulation.  Also concern for small bowel obstruction with patient not having bowel movement in 2 days.  Lab work was independently reviewed myself.  Pertinent findings include: -Lactic acid within normal limit -Lipase, CBC, CMP, urinalysis unremarkable.  CT imaging shows persistent finding of large right inguinal hernia containing a portion of the sigmoid colon and some fluid.  No evidence of proximal dilation although the proximal colon is diffusely stool-filled which may indicate degree of obstruction.  Due to findings on CT scan I spoke to general surgeon Dr. Arnoldo Morale who advised he will  see the patient in the emergency department for evaluation.  After seeing the patient hernia was able to be successfully reduced.  Dr. Emilia Beck advised that patient can be discharged and follow-up with his office in the outpatient setting.  Due to stool seen on CT scan we will prescribe patient with MiraLAX to help with constipation.  Discussed results, findings, treatment and follow up. Patient advised of return precautions. Patient verbalized understanding and agreed with plan.  Patient care discussed with intervention Dr. Sabra Heck.        Final Clinical Impression(s) / ED Diagnoses Final diagnoses:  None    Rx / DC Orders ED Discharge Orders     None         Dyann Ruddle 02/13/21 2035    Noemi Chapel, MD 02/14/21 [phone]

## 2021-02-13 NOTE — Telephone Encounter (Signed)
Received call from patient [phone]~ telephone.   Reports that R groin pain has worsened and he feels he has ruptured his hernia. Denies any skin opening or bleeding from rectum. Reports severe pain and tenderness to groin.   Advised that per office protocol, patient will need to be seen in ER for evaluation of possible incarcerated hernia. Advised that patient can contact PCP for further recommendations.   Verbalized understanding.   Received call from PCP office. Requested to have consult to be moved up and referral changed to STAT.   Advised that patient will still need to be seen in ER as incarcerated bowel in hernia is emergent. States that they will educate patient.   Appointment re- scheduled from 02/20/2021 to 02/18/2021.

## 2021-02-13 NOTE — Telephone Encounter (Signed)
Per voice message left 10:25 am by patient requesting to refill pain medication, I called back to patient at 12:35 when message accessed to verify all information HYDROcodone-acetaminophen (NORCO/VICODIN) 5-325 MG tablet 20 tablet               [facility]               (Patient said tried to call here yesterday, and also pharmacy, and found out power was out all around our area)

## 2021-02-13 NOTE — Consult Note (Signed)
Reason for Consult: Incarcerated right inguinal hernia Referring Physician: Dr. Kateri Mc is an 71 y.o. male.  HPI: Patient is a 71 year old black male with a known history of a right inguinal hernia over the past 6 months who presented to the emergency room with pain in the right groin.  He states that his hernia was sticking out and would not go back in.  An attempt by the emergency room personnel was unsuccessful.  I was asked to see the patient.  He states that he had just seen his primary care physician earlier this week and was being referred to our office for further evaluation and treatment of his right inguinal hernia.  He denies any nausea or vomiting.  His last bowel movement was 2 days ago.  It is made worse with straining.  Past Medical History:  Diagnosis Date   Bladder cancer ([place]) 2000   Depression    Essential hypertension 01/16/2018   Nicotine addiction    Prostate cancer ([place]) 07/06/2014   Seasonal allergies     Past Surgical History:  Procedure Laterality Date   athroscopy of right knee  2002   cancerous polyps removed from bladder  2000   CATARACT EXTRACTION W/PHACO Left 03/01/2020   Procedure: CATARACT EXTRACTION PHACO AND INTRAOCULAR LENS PLACEMENT ([place]);  Surgeon: Baruch Goldmann, MD;  Location: [facility];  Service: Ophthalmology;  Laterality: Left;  CDE  4.96   CATARACT EXTRACTION W/PHACO Right 03/22/2020   Procedure: CATARACT EXTRACTION PHACO AND INTRAOCULAR LENS PLACEMENT RIGHT EYE;  Surgeon: Baruch Goldmann, MD;  Location: [facility];  Service: Ophthalmology;  Laterality: Right;  CDE 5.78   COLONOSCOPY  MJ 2009 pmhX: POLYPS   POLYP REMOVED BUT NOT RETRIEVED   COLONOSCOPY  LS 2007 ARS   ? POLYPS, no path available   COLONOSCOPY  09/26/2010   sessile poylp(2) pan-colonic diverticulosis,mild/internal hemorrhoids   COLONOSCOPY WITH PROPOFOL N/A 02/09/2017   Procedure: COLONOSCOPY WITH PROPOFOL;  Surgeon: Danie Binder, MD;  [facility];  Service:  Endoscopy;  Laterality: N/A;  8:30am   COLONOSCOPY WITH PROPOFOL N/A 04/26/2020   Procedure: COLONOSCOPY WITH PROPOFOL;  Surgeon: Eloise Harman, DO;  [facility];  Service: Endoscopy;  Laterality: N/A;  AM   ESOPHAGOGASTRODUODENOSCOPY (EGD) WITH PROPOFOL N/A 02/09/2017   Procedure: ESOPHAGOGASTRODUODENOSCOPY (EGD) WITH PROPOFOL;  Surgeon: Danie Binder, MD;  [facility];  Service: Endoscopy;  Laterality: N/A;   FOOT SURGERY Bilateral 2004   HERNIA REPAIR  1980's   left inguinal hernia   LYMPHADENECTOMY Bilateral 09/03/2014   Procedure: PELVIC LYMPHADENECTOMY;  Surgeon: Raynelle Bring, MD;  Location: [facility];  Service: Urology;  Laterality: Bilateral;   POLYPECTOMY  02/09/2017   Procedure: POLYPECTOMY;  Surgeon: Danie Binder, MD;  [facility];  Service: Endoscopy;;  cecal polyp, hepatic flexure polyps x3, transverse colon polyp, descending colon polyps x2, rectal polyps x2   POLYPECTOMY  04/26/2020   Procedure: POLYPECTOMY;  Surgeon: Eloise Harman, DO;  [facility];  Service: Endoscopy;;   PROSTATE BIOPSY  07/06/14   ROBOT ASSISTED LAPAROSCOPIC RADICAL PROSTATECTOMY N/A 09/03/2014   Procedure: ROBOTIC ASSISTED LAPAROSCOPIC RADICAL PROSTATECTOMY LEVEL 2;  Surgeon: Raynelle Bring, MD;  [facility];  Service: Urology;  Laterality: N/A;    Family History  Problem Relation Age of Onset   Heart attack Father    Diabetes Sister    Hypertension Sister    Hypertension Sister    Aneurysm Sister  brain    Arthritis Other        family history    Colon cancer Neg Hx    Colon polyps Neg Hx     Social History:  reports that he has been smoking cigarettes. He has a 7.50 pack-year smoking history. He has never used smokeless tobacco. He reports current alcohol use. He reports current drug use. Drug: Marijuana.  Allergies:  Allergies  Allergen Reactions   Bee Venom    Codeine Nausea And Vomiting    Medications: I have reviewed the  patient's current medications.  Results for orders placed or performed during the hospital encounter of 02/13/21 (from the past 48 hour(s))  Comprehensive metabolic panel     Status: Abnormal   Collection Time: 02/13/21  5:03 PM  Result Value Ref Range   Sodium 133 (L) 135 - 145 mmol/L   Potassium 3.4 (L) 3.5 - 5.1 mmol/L   Chloride 101 98 - 111 mmol/L   CO2 24 22 - 32 mmol/L   Glucose, Bld 107 (H) 70 - 99 mg/dL    Comment: Glucose reference range applies only to samples taken after fasting for at least 8 hours.   BUN 11 8 - 23 mg/dL   Creatinine, Ser 0.96 0.61 - 1.24 mg/dL   Calcium 8.5 (L) 8.9 - 10.3 mg/dL   Total Protein 6.8 6.5 - 8.1 g/dL   Albumin 3.7 3.5 - 5.0 g/dL   AST 18 15 - 41 U/L   ALT 17 0 - 44 U/L   Alkaline Phosphatase 56 38 - 126 U/L   Total Bilirubin 1.1 0.3 - 1.2 mg/dL   GFR, Estimated >60 >60 mL/min    Comment: (NOTE) Calculated using the CKD-EPI Creatinine Equation (2021)    Anion gap 8 5 - 15    Comment: Performed at [facility], [address] [place], [place] [address]  CBC with Differential     Status: None   Collection Time: 02/13/21  5:03 PM  Result Value Ref Range   WBC 5.3 4.0 - 10.5 K/uL   RBC 4.53 4.22 - 5.81 MIL/uL   Hemoglobin 14.3 13.0 - 17.0 g/dL   HCT 42.5 39.0 - 52.0 %   MCV 93.8 80.0 - 100.0 fL   MCH 31.6 26.0 - 34.0 pg   MCHC 33.6 30.0 - 36.0 g/dL   RDW 13.1 11.5 - 15.5 %   Platelets 233 150 - 400 K/uL   nRBC 0.0 0.0 - 0.2 %   Neutrophils Relative % 70 %   Neutro Abs 3.7 1.7 - 7.7 K/uL   Lymphocytes Relative 20 %   Lymphs Abs 1.0 0.7 - 4.0 K/uL   Monocytes Relative 8 %   Monocytes Absolute 0.4 0.1 - 1.0 K/uL   Eosinophils Relative 1 %   Eosinophils Absolute 0.0 0.0 - 0.5 K/uL   Basophils Relative 1 %   Basophils Absolute 0.0 0.0 - 0.1 K/uL   Immature Granulocytes 0 %   Abs Immature Granulocytes 0.01 0.00 - 0.07 K/uL    Comment: Performed at [facility], [address] [place], [place] [address]  Lipase, blood     Status:  None   Collection Time: 02/13/21  5:03 PM  Result Value Ref Range   Lipase 23 11 - 51 U/L    Comment: Performed at [facility], [address], [place], [place] [address]  Lactic acid, plasma     Status: None   Collection Time: 02/13/21  6:08 PM  Result Value Ref  Range   Lactic Acid, Venous 1.0 0.5 - 1.9 mmol/L    Comment: Performed at [facility], [address] [place], [place] [address]  Urinalysis, Routine w reflex microscopic Urine, Clean Catch     Status: Abnormal   Collection Time: 02/13/21  7:24 PM  Result Value Ref Range   Color, Urine YELLOW YELLOW   APPearance CLEAR CLEAR   Specific Gravity, Urine 1.033 (H) 1.005 - 1.030   pH 7.0 5.0 - 8.0   Glucose, UA NEGATIVE NEGATIVE mg/dL   Hgb urine dipstick NEGATIVE NEGATIVE   Bilirubin Urine NEGATIVE NEGATIVE   Ketones, ur NEGATIVE NEGATIVE mg/dL   Protein, ur NEGATIVE NEGATIVE mg/dL   Nitrite NEGATIVE NEGATIVE   Leukocytes,Ua NEGATIVE NEGATIVE    Comment: Performed at [facility], [address] [place], [place] [address]    CT ABDOMEN PELVIS W CONTRAST  Result Date: 02/13/2021 CLINICAL DATA:  Right lower quadrant pain since yesterday. Bowel obstruction suspected. Inguinal hernia. History of prostate and bladder cancer. EXAM: CT ABDOMEN AND PELVIS WITH CONTRAST TECHNIQUE: Multidetector CT imaging of the abdomen and pelvis was performed using the standard protocol following bolus administration of intravenous contrast. RADIATION DOSE REDUCTION: This exam was performed according to the departmental dose-optimization program which includes automated exposure control, adjustment of the mA and/or kV according to patient size and/or use of iterative reconstruction technique. CONTRAST:  157mL OMNIPAQUE IOHEXOL 300 MG/ML  SOLN COMPARISON:  02/05/2021 FINDINGS: Lower chest: Emphysematous changes in the lung bases. No focal consolidation. Hepatobiliary: Multiple hepatic cysts are unchanged since prior study. Gallbladder and bile ducts are  unremarkable. Pancreas: Unremarkable. No pancreatic ductal dilatation or surrounding inflammatory changes. Spleen: Normal in size without focal abnormality. Adrenals/Urinary Tract: Multiple renal cysts are unchanged since prior study. No follow-up is indicated. Nephrograms are symmetrical. No hydronephrosis or hydroureter. No adrenal gland nodules. Bladder is unremarkable. Stomach/Bowel: Large right inguinal hernia containing a portion of the sigmoid colon and fluid. On the previous study, the hernia contained terminal ileum. Possibly the hernia has been reduced in the interval and has recurred? No definite evidence of proximal obstruction although the proximal colon is diffusely stool-filled without significant dilatation. No wall thickening or inflammatory changes. Small bowel are decompressed. Appendix is normal. Vascular/Lymphatic: Diffuse calcific and noncalcific plaque formation throughout the aortoiliac vessels. Infrarenal abdominal aortic aneurysm again demonstrated measuring 3.3 cm diameter. Bilateral iliac artery aneurysms, 1.8 cm on the right and 1.8 cm on the left. No change since previous study. No significant lymphadenopathy. Reproductive: Prostate gland is surgically absent. Other: No free air or free fluid in the abdomen. Musculoskeletal: Degenerative changes in the spine. A few scattered tiny sclerotic bone lesions are demonstrated, unchanged since previous study from 07/18/2014. Degenerative changes in the hips. IMPRESSION: 1. Persistent finding of a large right inguinal hernia containing a portion of the sigmoid colon and some fluid. No evidence of proximal dilatation although the proximal colon is diffusely stool-filled, which may indicate degree of obstruction. 2. Unchanged appearance of abdominal aortic and iliac artery aneurysms. Recommend follow-up ultrasound every 3 years. This recommendation follows ACR consensus guidelines: White Paper of the ACR Incidental Findings Committee II on  Vascular Findings. J Am Coll Radiol 2013; 10:789-794. 3. Aortic atherosclerosis. 4. Multiple benign-appearing renal and hepatic cyst. No follow-up is indicated. Electronically Signed   By: Lucienne Capers M.D.   On: 02/13/2021 18:16    ROS:  Pertinent items are noted in HPI.  Blood pressure (!) 143/79, pulse (!) 56, temperature 98.1 F (36.7  C), temperature source Oral, resp. rate 18, height 6\' 3"  (1.905 m), weight 89 kg, SpO2 97 %. Physical Exam: Pleasant well-developed well-nourished black male in no acute distress Head is normocephalic, atraumatic Lungs are clear to auscultation with equal breath sounds bilaterally Heart examination reveals regular rate and rhythm without S3, S4, murmurs Abdomen is soft, nontender, nondistended.  A right inguinal hernia is present.  It was reduced at bedside.  Testicular examination unremarkable.  CT scan images personally reviewed  Assessment/Plan: Impression: Incarcerated right inguinal hernia, resolved Plan: Patient would like to go home and schedule repair of his hernia as an outpatient.  He was instructed to call my office for a follow-up appointment.  Aviva Signs 02/13/2021, 8:25 PM

## 2021-02-13 NOTE — Telephone Encounter (Signed)
I was able to move Mr Matthew Adkins appt up with Dr Constance Haw to 2/14 11:30am.   I have called him and let him know and told him again if he is in to much pain or starts Bleeding per Dr Constance Haw office go to the ER.

## 2021-02-13 NOTE — ED Triage Notes (Signed)
Right lower quadrant pain, states she has a hernia

## 2021-02-13 NOTE — Discharge Instructions (Signed)
You came to the emergency department today to be evaluated for your pain associated with your hernia.  Your hernia was successfully reduced.  You will need to follow-up with Dr. Arnoldo Morale in the outpatient setting for further evaluation.  Due to your constipation you are started on MiraLAX.  Please take this medication as prescribed.  Get help right away if: You have pain in your groin that suddenly gets worse. You have a bulge in your groin that: Suddenly gets bigger and does not get smaller. Becomes red or purple or painful to the touch. You are a man and you have a sudden pain in your scrotum, or the size of your scrotum suddenly changes. You cannot push the hernia back in place by very gently pressing on it when you are lying down. You have nausea or vomiting that does not go away. You have a fast heartbeat. You cannot have a bowel movement or pass gas.

## 2021-02-15 LAB — URINE CULTURE: Culture: <10000 — AB

## 2021-02-18 ENCOUNTER — Other Ambulatory Visit: Payer: Self-pay

## 2021-02-18 ENCOUNTER — Ambulatory Visit: Payer: Medicare PPO | Admitting: General Surgery

## 2021-02-18 ENCOUNTER — Encounter: Payer: Self-pay | Admitting: General Surgery

## 2021-02-18 VITALS — BP 131/75 | HR 52 | Temp 97.9°F | Resp 14 | Ht 75.0 in | Wt 198.0 lb

## 2021-02-18 DIAGNOSIS — K4090 Unilateral inguinal hernia, without obstruction or gangrene, not specified as recurrent: Secondary | ICD-10-CM | POA: Diagnosis not present

## 2021-02-18 NOTE — Progress Notes (Signed)
[facility] History and Physical  Reason for Referral: Right inguinal hernia  Referring Physician: Fayrene Helper, MD   Chief Complaint   New Patient (Initial Visit)     Matthew Adkins is a 71 y.o. male.  HPI: Matthew Adkins is a 71 yo who has known he had a hernia for several months on the right side. This has been getting larger and more uncomfortable. He says that he had to go to the hospital for the pain and it was pushed back in. He says he had a hernia repair in the 89s. Documentation in his chart indicates it was a left sided hernia in the 80s. He has a history or bladder and prostate cancer and had surgery a few years ago. He has been doing well from this procedure. He has no cardiac history and no chest pain or SOB.   Past Medical History:  Diagnosis Date   Bladder cancer ([place]) 2000   Depression    Essential hypertension 01/16/2018   Nicotine addiction    Prostate cancer ([place]) 07/06/2014   Seasonal allergies     Past Surgical History:  Procedure Laterality Date   athroscopy of right knee  2002   cancerous polyps removed from bladder  2000   CATARACT EXTRACTION W/PHACO Left 03/01/2020   Procedure: CATARACT EXTRACTION PHACO AND INTRAOCULAR LENS PLACEMENT ([place]);  Surgeon: Baruch Goldmann, MD;  [facility]: [facility];  Service: Ophthalmology;  Laterality: Left;  CDE  4.96   CATARACT EXTRACTION W/PHACO Right 03/22/2020   Procedure: CATARACT EXTRACTION PHACO AND INTRAOCULAR LENS PLACEMENT RIGHT EYE;  Surgeon: Baruch Goldmann, MD;  [facility]: [facility];  Service: Ophthalmology;  Laterality: Right;  CDE 5.78   COLONOSCOPY  MJ 2009 pmhX: POLYPS   POLYP REMOVED BUT NOT RETRIEVED   COLONOSCOPY  LS 2007 ARS   ? POLYPS, no path available   COLONOSCOPY  09/26/2010   sessile poylp(2) pan-colonic diverticulosis,mild/internal hemorrhoids   COLONOSCOPY WITH PROPOFOL N/A 02/09/2017   Procedure: COLONOSCOPY WITH PROPOFOL;  Surgeon: Danie Binder, MD;  [facility];   Service: Endoscopy;  Laterality: N/A;  8:30am   COLONOSCOPY WITH PROPOFOL N/A 04/26/2020   Procedure: COLONOSCOPY WITH PROPOFOL;  Surgeon: Eloise Harman, DO;  [facility];  Service: Endoscopy;  Laterality: N/A;  AM   ESOPHAGOGASTRODUODENOSCOPY (EGD) WITH PROPOFOL N/A 02/09/2017   Procedure: ESOPHAGOGASTRODUODENOSCOPY (EGD) WITH PROPOFOL;  Surgeon: Danie Binder, MD;  [facility];  Service: Endoscopy;  Laterality: N/A;   FOOT SURGERY Bilateral 2004   HERNIA REPAIR  1980's   left inguinal hernia   LYMPHADENECTOMY Bilateral 09/03/2014   Procedure: PELVIC LYMPHADENECTOMY;  Surgeon: Raynelle Bring, MD;  [facility];  Service: Urology;  Laterality: Bilateral;   POLYPECTOMY  02/09/2017   Procedure: POLYPECTOMY;  Surgeon: Danie Binder, MD;  [facility];  Service: Endoscopy;;  cecal polyp, hepatic flexure polyps x3, transverse colon polyp, descending colon polyps x2, rectal polyps x2   POLYPECTOMY  04/26/2020   Procedure: POLYPECTOMY;  Surgeon: Eloise Harman, DO;  [facility];  Service: Endoscopy;;   PROSTATE BIOPSY  07/06/14   ROBOT ASSISTED LAPAROSCOPIC RADICAL PROSTATECTOMY N/A 09/03/2014   Procedure: ROBOTIC ASSISTED LAPAROSCOPIC RADICAL PROSTATECTOMY LEVEL 2;  Surgeon: Raynelle Bring, MD;  [facility];  Service: Urology;  Laterality: N/A;    Family History  Problem Relation Age of Onset   Heart attack Father    Diabetes Sister    Hypertension Sister  Hypertension Sister    Aneurysm Sister        brain    Arthritis Other        family history    Colon cancer Neg Hx    Colon polyps Neg Hx     Social History   Tobacco Use   Smoking status: Every Day    Packs/day: 0.25    Years: 30.00    Pack years: 7.50    Types: Cigarettes   Smokeless tobacco: Never  Vaping Use   Vaping Use: Never used  Substance Use Topics   Alcohol use: Yes    Comment: 3 pints/month   Drug use: Yes    Types: Marijuana    Comment: occassionally     Medications: I have reviewed the patient's current medications. Allergies as of 02/18/2021       Reactions   Bee Venom    Codeine Nausea And Vomiting        Medication List        Accurate as of February 18, 2021 12:29 PM. If you have any questions, ask your nurse or doctor.          STOP taking these medications    benzonatate 100 MG capsule Commonly known as: TESSALON Stopped by: Virl Cagey, MD       TAKE these medications    acetaminophen 500 MG tablet Commonly known as: TYLENOL Take 1,000 mg by mouth every 6 (six) hours as needed for mild pain.   amLODipine 10 MG tablet Commonly known as: NORVASC Take 1 tablet (10 mg total) by mouth daily.   buPROPion 150 MG 12 hr tablet Commonly known as: Wellbutrin SR Take 1 tablet (150 mg total) by mouth 2 (two) times daily.   FLUoxetine 20 MG capsule Commonly known as: PROZAC Take 1 capsule (20 mg total) by mouth daily.   HYDROcodone-acetaminophen 5-325 MG tablet Commonly known as: NORCO/VICODIN Take 1 tablet by mouth every 6 (six) hours as needed for moderate pain.   olmesartan-hydrochlorothiazide 20-12.5 MG tablet Commonly known as: Benicar HCT Take 1 tablet by mouth daily.   omeprazole 20 MG capsule Commonly known as: PRILOSEC Take 1 capsule (20 mg total) by mouth daily. TAKE 1 CAPSULE BY MOUTH 30 MINUTES PRIOR TO BREAKFAST.   ONE DAILY MENS 50+ MULTIVIT PO Take 1 tablet by mouth daily.   polyethylene glycol 17 g packet Commonly known as: MiraLax Take 17 g by mouth daily.         ROS:  A comprehensive review of systems was negative except for: Gastrointestinal: positive for abdominal pain, reflux symptoms, and groin pain Musculoskeletal: positive for joint pain  Blood pressure 131/75, pulse (!) 52, temperature 97.9 F (36.6 C), temperature source Other (Comment), resp. rate 14, height 6\' 3"  (1.905 m), weight 198 lb (89.8 kg), SpO2 97 %. Physical Exam Vitals reviewed.   Constitutional:      Appearance: Normal appearance.  HENT:     Head: Normocephalic.     Nose: Nose normal.     Mouth/Throat:     Mouth: Mucous membranes are moist.  Cardiovascular:     Rate and Rhythm: Normal rate.  Pulmonary:     Effort: Pulmonary effort is normal.     Breath sounds: Normal breath sounds.  Abdominal:     General: There is no distension.     Palpations: Abdomen is soft.     Tenderness: There is no abdominal tenderness.     Hernia: A hernia is present. Hernia  is present in the right inguinal area. There is no hernia in the left inguinal area.     Comments: Reducible, tender right inguinal hernia  Musculoskeletal:        General: Normal range of motion.     Cervical back: Normal range of motion.  Skin:    General: Skin is warm.  Neurological:     General: No focal deficit present.     Mental Status: He is alert.  Psychiatric:        Mood and Affect: Mood normal.    Results: Personally reviewed CT right inguinal hernia with sigmoid colon  CLINICAL DATA:  Right lower quadrant pain since yesterday. Bowel obstruction suspected. Inguinal hernia. History of prostate and bladder cancer.   EXAM: CT ABDOMEN AND PELVIS WITH CONTRAST   TECHNIQUE: Multidetector CT imaging of the abdomen and pelvis was performed using the standard protocol following bolus administration of intravenous contrast.   RADIATION DOSE REDUCTION: This exam was performed according to the departmental dose-optimization program which includes automated exposure control, adjustment of the mA and/or kV according to patient size and/or use of iterative reconstruction technique.   CONTRAST:  130mL OMNIPAQUE IOHEXOL 300 MG/ML  SOLN   COMPARISON:  02/05/2021   FINDINGS: Lower chest: Emphysematous changes in the lung bases. No focal consolidation.   Hepatobiliary: Multiple hepatic cysts are unchanged since prior study. Gallbladder and bile ducts are unremarkable.   Pancreas:  Unremarkable. No pancreatic ductal dilatation or surrounding inflammatory changes.   Spleen: Normal in size without focal abnormality.   Adrenals/Urinary Tract: Multiple renal cysts are unchanged since prior study. No follow-up is indicated. Nephrograms are symmetrical. No hydronephrosis or hydroureter. No adrenal gland nodules. Bladder is unremarkable.   Stomach/Bowel: Large right inguinal hernia containing a portion of the sigmoid colon and fluid. On the previous study, the hernia contained terminal ileum. Possibly the hernia has been reduced in the interval and has recurred? No definite evidence of proximal obstruction although the proximal colon is diffusely stool-filled without significant dilatation. No wall thickening or inflammatory changes. Small bowel are decompressed. Appendix is normal.   Vascular/Lymphatic: Diffuse calcific and noncalcific plaque formation throughout the aortoiliac vessels. Infrarenal abdominal aortic aneurysm again demonstrated measuring 3.3 cm diameter. Bilateral iliac artery aneurysms, 1.8 cm on the right and 1.8 cm on the left. No change since previous study. No significant lymphadenopathy.   Reproductive: Prostate gland is surgically absent.   Other: No free air or free fluid in the abdomen.   Musculoskeletal: Degenerative changes in the spine. A few scattered tiny sclerotic bone lesions are demonstrated, unchanged since previous study from 07/18/2014. Degenerative changes in the hips.   IMPRESSION: 1. Persistent finding of a large right inguinal hernia containing a portion of the sigmoid colon and some fluid. No evidence of proximal dilatation although the proximal colon is diffusely stool-filled, which may indicate degree of obstruction. 2. Unchanged appearance of abdominal aortic and iliac artery aneurysms. Recommend follow-up ultrasound every 3 years. This recommendation follows ACR consensus guidelines: White Paper of the ACR Incidental  Findings Committee II on Vascular Findings. J Am Coll Radiol 2013; 10:789-794. 3. Aortic atherosclerosis. 4. Multiple benign-appearing renal and hepatic cyst. No follow-up is indicated.     Electronically Signed   By: Lucienne Capers M.D.   On: 02/13/2021 18:16     Assessment & Plan:  Matthew Adkins is a 71 y.o. male with a right inguinal hernia. He is ready to  have this fixed.   Discussed the  risk and benefits including, bleeding, infection, use of mesh, risk of recurrence, risk of nerve damage causing numbness or changes in sensation, risk of damage to the cord structures. The patient understands the risk and benefits of repair with mesh, and has decided to proceed.  We also discussed open versus laparoscopic surgery and the use of mesh. We discussed that I do open repairs with mesh, and that this is considered equivalent to laparoscopic surgery. We discussed reasons for opting for laparoscopic surgery including if a bilateral repair is needed or if a patient has a recurrence after an open repair.   All questions were answered to the satisfaction of the patient.   Virl Cagey 02/18/2021, 12:29 PM

## 2021-02-18 NOTE — Patient Instructions (Signed)
Open Hernia Repair, Adult °Open hernia repair is a surgical procedure to fix a hernia. A hernia occurs when an internal organ or tissue pushes through a weak spot in the muscles along the wall of the abdomen. Hernias commonly occur in the groin and around the belly button. °Most hernias tend to get worse over time. Often, surgery is done to prevent the hernia from becoming bigger, uncomfortable, or an emergency. Emergency surgery may be needed if contents of the abdomen get stuck in the opening (incarcerated hernia) or if the blood supply gets cut off (strangulated hernia). In an open repair, an incision is made in the abdomen to perform the surgery. °Tell a health care provider about: °Any allergies you have. °All medicines you are taking, including vitamins, herbs, eye drops, creams, and over-the-counter medicines. °Any problems you or family members have had with anesthetic medicines. °Any blood or bone disorders you have. °Any surgeries you have had. °Any medical conditions you have, including any recent cold or flu (influenza)symptoms. °Whether you are pregnant or may be pregnant. °What are the risks? °Generally, this is a safe procedure. However, problems may occur, including: °Long-lasting (chronic) pain. °Bleeding. °Infection. °Damage to the testicles. This can cause shrinking or swelling. °Damage to nearby structures or organs, including the bladder, blood vessels, intestines, or nerves near the hernia. °Blood clots. °Trouble passing urine. °Return of the hernia. °What happens before the procedure? °Medicines °Ask your health care provider about: °Changing or stopping your regular medicines. This is especially important if you are taking diabetes medicines or blood thinners. °Taking medicines such as aspirin and ibuprofen. These medicines can thin your blood. Do not take these medicines unless your health care provider tells you to take them. °Taking over-the-counter medicines, vitamins, herbs, and  supplements. °Surgery safety °Ask your health care provider: °How your surgery site will be marked. °What steps will be taken to help prevent infection. These steps may include: °Removing hair at the surgery site. °Washing skin with a germ-killing soap. °Receiving antibiotic medicine. °General instructions °You may have an exam or testing, such as blood tests or imaging studies. °Do not use any products that contain nicotine or tobacco for at least 4 weeks before the procedure. These products include cigarettes, chewing tobacco, and vaping devices, such as e-cigarettes. If you need help quitting, ask your health care provider. °Let your health care provider know if you develop a cold or any infection before your surgery. If you get an infection before surgery, you may receive antibiotics to treat it. °Plan to have a responsible adult take you home from the hospital or clinic. °If you will be going home right after the procedure, plan to have a responsible adult care for you for the time you are told. This is important. °What happens during the procedure? ° °An IV will be inserted into one of your veins. °You will be given one or more of the following: °A medicine to help you relax (sedative). °A medicine to numb the area (local anesthetic). °A medicine to make you fall asleep (general anesthetic). °Your surgeon will make an incision over the hernia. °The tissues of the hernia will be moved back into place. °The edges of the hernia may be stitched (sutured) together. °The opening in the abdominal muscles will be closed with stitches (sutures). Or, your surgeon will place a mesh patch made of artificial (synthetic) material over the opening. °The incision will be closed with sutures, skin glue, or adhesive strips. °A bandage (dressing) may be   placed over the incision. °The procedure may vary among health care providers and hospitals. °What happens after the procedure? °Your blood pressure, heart rate, breathing rate,  and blood oxygen level will be monitored until you leave the hospital or clinic. °You may be given medicine for pain. °If you were given a sedative during the procedure, it can affect you for several hours. Do not drive or operate machinery until your health care provider says that it is safe. °Summary °Open hernia repair is a surgical procedure to fix a hernia. Hernias commonly occur in the groin and around the belly button. °Emergency surgery may be needed if contents of the abdomen get stuck in the opening (incarcerated hernia) or if the blood supply gets cut off (strangulated hernia). °In this procedure, an incision is made in the abdomen to perform the surgery. °After the procedure, you may be given medicine for pain. °This information is not intended to replace advice given to you by your health care provider. Make sure you discuss any questions you have with your health care provider. °Document Revised: 08/07/2019 Document Reviewed: 08/07/2019 °Elsevier Patient Education © 2022 [employer] Inc[employer] ° °

## 2021-02-19 NOTE — Patient Instructions (Signed)
Matthew Adkins  02/19/2021     @[email or web address] @   Your procedure is scheduled on  02/21/2030.   Report to [facility] at  0930 A.M.   Call this number if you have problems the morning of surgery:  [phone]   Remember:  Do not eat or drink after midnight.      Take these medicines the morning of surgery with A SIP OF WATER             amlodipine, wellbutrin, prozac, hydrocodone(If needed), omeprazole.     Do not wear jewelry, make-up or nail polish.  Do not wear lotions, powders, or perfumes, or deodorant.  Do not shave 48 hours prior to surgery.  Men may shave face and neck.  Do not bring valuables to the hospital.  [facility] is not responsible for any belongings or valuables.  Contacts, dentures or bridgework may not be worn into surgery.  Leave your suitcase in the car.  After surgery it may be brought to your room.  For patients admitted to the hospital, discharge time will be determined by your treatment team.  Patients discharged the day of surgery will not be allowed to drive home and must have someone with them for 24 hours.     Special instructions:   DO NOT smoke tobacco or vape for 24 hours before your procedure.  Please read over the following fact sheets that you were given. Coughing and Deep Breathing, Surgical Site Infection Prevention, Anesthesia Post-op Instructions, and Care and Recovery After Surgery      Open Hernia Repair, Adult, Care After What can I expect after the procedure? After the procedure, it is common to have: Mild discomfort. Slight bruising. Mild swelling. Pain in the belly (abdomen). A small amount of blood from the cut from surgery (incision). Follow these instructions at home: Your doctor may give you more specific instructions. If you have problems, call your doctor. Medicines Take over-the-counter and prescription medicines only as told by your doctor. If told, take steps to prevent problems with  pooping (constipation). You may need to: Drink enough fluid to keep your pee (urine) pale yellow. Take medicines. You will be told what medicines to take. Eat foods that are high in fiber. These include beans, whole grains, and fresh fruits and vegetables. Limit foods that are high in fat and sugar. These include fried or sweet foods. Ask your doctor if you should avoid driving or using machines while you are taking your medicine. Incision care  Follow instructions from your doctor about how to take care of your incision. Make sure you: Wash your hands with soap and water for at least 20 seconds before and after you change your bandage (dressing). If you cannot use soap and water, use hand sanitizer. Change your bandage. Leave stitches or skin glue in place for at least 2 weeks. Leave tape strips alone unless you are told to take them off. You may trim the edges of the tape strips if they curl up. Check your incision every day for signs of infection. Check for: More redness, swelling, or pain. More fluid or blood. Warmth. Pus or a bad smell. Wear loose, soft clothing while your incision heals. Activity  Rest as told by your doctor. Do not lift anything that is heavier than 10 lb (4.5 kg), or the limit that you are told. Do not play contact sports until your doctor says that this is safe. If you  were given a sedative during your procedure, do not drive or use machines until your doctor says that it is safe. A sedative is a medicine that helps you relax. Return to your normal activities when your doctor says that it is safe. General instructions Do not take baths, swim, or use a hot tub. Ask your doctor about taking showers or sponge baths. Hold a pillow over your belly when you cough or sneeze. This helps with pain. Do not smoke or use any products that contain nicotine or tobacco. If you need help quitting, ask your doctor. Keep all follow-up visits. Contact a doctor if: You have any  of these signs of infection in or around your incision: More redness, swelling, or pain. More fluid or blood. Warmth. Pus. A bad smell. You have a fever or chills. You have blood in your poop (stool). You have not pooped (had a bowel movement) in 2-3 days. Medicine does not help your pain. Get help right away if: You have chest pain, or you are short of breath. You feel faint or light-headed. You have very bad pain. You vomit and your pain is worse. You have pain, swelling, or redness in a leg. These symptoms may be an emergency. Get help right away. Call your local emergency services (911 in the [place] Do not wait to see if the symptoms will go away. Do not drive yourself to the hospital. Summary After this procedure, it is common to have mild discomfort, slight bruising, and mild swelling. Follow instructions from your doctor about how to take care of your cut from surgery (incision). Check every day for signs of infection. Do not lift heavy objects or play contact sports until your doctor says it is safe. Return to your normal activities as told by your doctor. This information is not intended to replace advice given to you by your health care provider. Make sure you discuss any questions you have with your health care provider. Document Revised: 08/07/2019 Document Reviewed: 08/07/2019 Elsevier Patient Education  2022 [employer] Anesthesia, Adult, Care After This sheet gives you information about how to care for yourself after your procedure. Your health care provider may also give you more specific instructions. If you have problems or questions, contact your health care provider. What can I expect after the procedure? After the procedure, the following side effects are common: Pain or discomfort at the IV site. Nausea. Vomiting. Sore throat. Trouble concentrating. Feeling cold or chills. Feeling weak or tired. Sleepiness and fatigue. Soreness and body aches.  These side effects can affect parts of the body that were not involved in surgery. Follow these instructions at home: For the time period you were told by your health care provider:  Rest. Do not participate in activities where you could fall or become injured. Do not drive or use machinery. Do not drink alcohol. Do not take sleeping pills or medicines that cause drowsiness. Do not make important decisions or sign legal documents. Do not take care of children on your own. Eating and drinking Follow any instructions from your health care provider about eating or drinking restrictions. When you feel hungry, start by eating small amounts of foods that are soft and easy to digest (bland), such as toast. Gradually return to your regular diet. Drink enough fluid to keep your urine pale yellow. If you vomit, rehydrate by drinking water, juice, or clear broth. General instructions If you have sleep apnea, surgery and certain medicines can increase your risk for  breathing problems. Follow instructions from your health care provider about wearing your sleep device: Anytime you are sleeping, including during daytime naps. While taking prescription pain medicines, sleeping medicines, or medicines that make you drowsy. Have a responsible adult stay with you for the time you are told. It is important to have someone help care for you until you are awake and alert. Return to your normal activities as told by your health care provider. Ask your health care provider what activities are safe for you. Take over-the-counter and prescription medicines only as told by your health care provider. If you smoke, do not smoke without supervision. Keep all follow-up visits as told by your health care provider. This is important. Contact a health care provider if: You have nausea or vomiting that does not get better with medicine. You cannot eat or drink without vomiting. You have pain that does not get better with  medicine. You are unable to pass urine. You develop a skin rash. You have a fever. You have redness around your IV site that gets worse. Get help right away if: You have difficulty breathing. You have chest pain. You have blood in your urine or stool, or you vomit blood. Summary After the procedure, it is common to have a sore throat or nausea. It is also common to feel tired. Have a responsible adult stay with you for the time you are told. It is important to have someone help care for you until you are awake and alert. When you feel hungry, start by eating small amounts of foods that are soft and easy to digest (bland), such as toast. Gradually return to your regular diet. Drink enough fluid to keep your urine pale yellow. Return to your normal activities as told by your health care provider. Ask your health care provider what activities are safe for you. This information is not intended to replace advice given to you by your health care provider. Make sure you discuss any questions you have with your health care provider. Document Revised: 09/07/2019 Document Reviewed: 04/06/2019 Elsevier Patient Education  2022 [place]. How to Use Chlorhexidine for Bathing Chlorhexidine gluconate (CHG) is a germ-killing (antiseptic) solution that is used to clean the skin. It can get rid of the bacteria that normally live on the skin and can keep them away for about 24 hours. To clean your skin with CHG, you may be given: A CHG solution to use in the shower or as part of a sponge bath. A prepackaged cloth that contains CHG. Cleaning your skin with CHG may help lower the risk for infection: While you are staying in the intensive care unit of the hospital. If you have a vascular access, such as a central line, to provide short-term or long-term access to your veins. If you have a catheter to drain urine from your bladder. If you are on a ventilator. A ventilator is a machine that helps you breathe  by moving air in and out of your lungs. After surgery. What are the risks? Risks of using CHG include: A skin reaction. Hearing loss, if CHG gets in your ears and you have a perforated eardrum. Eye injury, if CHG gets in your eyes and is not rinsed out. The CHG product catching fire. Make sure that you avoid smoking and flames after applying CHG to your skin. Do not use CHG: If you have a chlorhexidine allergy or have previously reacted to chlorhexidine. On babies younger than 74 months of age. How to  use CHG solution Use CHG only as told by your health care provider, and follow the instructions on the label. Use the full amount of CHG as directed. Usually, this is one bottle. During a shower Follow these steps when using CHG solution during a shower (unless your health care provider gives you different instructions): Start the shower. Use your normal soap and shampoo to wash your face and hair. Turn off the shower or move out of the shower stream. Pour the CHG onto a clean washcloth. Do not use any type of brush or rough-edged sponge. Starting at your neck, lather your body down to your toes. Make sure you follow these instructions: If you will be having surgery, pay special attention to the part of your body where you will be having surgery. Scrub this area for at least 1 minute. Do not use CHG on your head or face. If the solution gets into your ears or eyes, rinse them well with water. Avoid your genital area. Avoid any areas of skin that have broken skin, cuts, or scrapes. Scrub your back and under your arms. Make sure to wash skin folds. Let the lather sit on your skin for 1-2 minutes or as long as told by your health care provider. Thoroughly rinse your entire body in the shower. Make sure that all body creases and crevices are rinsed well. Dry off with a clean towel. Do not put any substances on your body afterward--such as powder, lotion, or perfume--unless you are told to do so  by your health care provider. Only use lotions that are recommended by the manufacturer. Put on clean clothes or pajamas. If it is the night before your surgery, sleep in clean sheets.  During a sponge bath Follow these steps when using CHG solution during a sponge bath (unless your health care provider gives you different instructions): Use your normal soap and shampoo to wash your face and hair. Pour the CHG onto a clean washcloth. Starting at your neck, lather your body down to your toes. Make sure you follow these instructions: If you will be having surgery, pay special attention to the part of your body where you will be having surgery. Scrub this area for at least 1 minute. Do not use CHG on your head or face. If the solution gets into your ears or eyes, rinse them well with water. Avoid your genital area. Avoid any areas of skin that have broken skin, cuts, or scrapes. Scrub your back and under your arms. Make sure to wash skin folds. Let the lather sit on your skin for 1-2 minutes or as long as told by your health care provider. Using a different clean, wet washcloth, thoroughly rinse your entire body. Make sure that all body creases and crevices are rinsed well. Dry off with a clean towel. Do not put any substances on your body afterward--such as powder, lotion, or perfume--unless you are told to do so by your health care provider. Only use lotions that are recommended by the manufacturer. Put on clean clothes or pajamas. If it is the night before your surgery, sleep in clean sheets. How to use CHG prepackaged cloths Only use CHG cloths as told by your health care provider, and follow the instructions on the label. Use the CHG cloth on clean, dry skin. Do not use the CHG cloth on your head or face unless your health care provider tells you to. When washing with the CHG cloth: Avoid your genital area. Avoid any areas  of skin that have broken skin, cuts, or scrapes. Before  surgery Follow these steps when using a CHG cloth to clean before surgery (unless your health care provider gives you different instructions): Using the CHG cloth, vigorously scrub the part of your body where you will be having surgery. Scrub using a back-and-forth motion for 3 minutes. The area on your body should be completely wet with CHG when you are done scrubbing. Do not rinse. Discard the cloth and let the area air-dry. Do not put any substances on the area afterward, such as powder, lotion, or perfume. Put on clean clothes or pajamas. If it is the night before your surgery, sleep in clean sheets.  For general bathing Follow these steps when using CHG cloths for general bathing (unless your health care provider gives you different instructions). Use a separate CHG cloth for each area of your body. Make sure you wash between any folds of skin and between your fingers and toes. Wash your body in the following order, switching to a new cloth after each step: The front of your neck, shoulders, and chest. Both of your arms, under your arms, and your hands. Your stomach and groin area, avoiding the genitals. Your right leg and foot. Your left leg and foot. The back of your neck, your back, and your buttocks. Do not rinse. Discard the cloth and let the area air-dry. Do not put any substances on your body afterward--such as powder, lotion, or perfume--unless you are told to do so by your health care provider. Only use lotions that are recommended by the manufacturer. Put on clean clothes or pajamas. Contact a health care provider if: Your skin gets irritated after scrubbing. You have questions about using your solution or cloth. You swallow any chlorhexidine. Call your local poison control center (1-365 753 7852 in the [place] Get help right away if: Your eyes itch badly, or they become very red or swollen. Your skin itches badly and is red or swollen. Your hearing changes. You have trouble  seeing. You have swelling or tingling in your mouth or throat. You have trouble breathing. These symptoms may represent a serious problem that is an emergency. Do not wait to see if the symptoms will go away. Get medical help right away. Call your local emergency services (911 in the [place] Do not drive yourself to the hospital. Summary Chlorhexidine gluconate (CHG) is a germ-killing (antiseptic) solution that is used to clean the skin. Cleaning your skin with CHG may help to lower your risk for infection. You may be given CHG to use for bathing. It may be in a bottle or in a prepackaged cloth to use on your skin. Carefully follow your health care provider's instructions and the instructions on the product label. Do not use CHG if you have a chlorhexidine allergy. Contact your health care provider if your skin gets irritated after scrubbing. This information is not intended to replace advice given to you by your health care provider. Make sure you discuss any questions you have with your health care provider. Document Revised: 03/04/2020 Document Reviewed: 03/04/2020 Elsevier Patient Education  2022 [employer].

## 2021-02-20 ENCOUNTER — Ambulatory Visit: Payer: Medicare PPO | Admitting: General Surgery

## 2021-02-20 ENCOUNTER — Other Ambulatory Visit: Payer: Self-pay

## 2021-02-20 ENCOUNTER — Encounter (HOSPITAL_COMMUNITY)
Admission: RE | Admit: 2021-02-20 | Discharge: 2021-02-20 | Disposition: A | Discharge status: Home / Self Care | Payer: Medicare PPO | Source: Ambulatory Visit | Attending: General Surgery | Admitting: General Surgery

## 2021-02-20 DIAGNOSIS — Z01810 Encounter for preprocedural cardiovascular examination: Secondary | ICD-10-CM | POA: Diagnosis not present

## 2021-02-20 NOTE — H&P (Signed)
[facility] History and Physical  Reason for Referral: Right inguinal hernia  Referring Physician: Fayrene Helper, MD   Chief Complaint   New Patient (Initial Visit)     Matthew Adkins is a 71 y.o. male.  HPI: Matthew Adkins is a 71 yo who has known he had a hernia for several months on the right side. This has been getting larger and more uncomfortable. He says that he had to go to the hospital for the pain and it was pushed back in. He says he had a hernia repair in the 97s. Documentation in his chart indicates it was a left sided hernia in the 80s. He has a history or bladder and prostate cancer and had surgery a few years ago. He has been doing well from this procedure. He has no cardiac history and no chest pain or SOB.   Past Medical History:  Diagnosis Date   Bladder cancer ([place]) 2000   Depression    Essential hypertension 01/16/2018   Nicotine addiction    Prostate cancer ([place]) 07/06/2014   Seasonal allergies     Past Surgical History:  Procedure Laterality Date   athroscopy of right knee  2002   cancerous polyps removed from bladder  2000   CATARACT EXTRACTION W/PHACO Left 03/01/2020   Procedure: CATARACT EXTRACTION PHACO AND INTRAOCULAR LENS PLACEMENT ([place]);  Surgeon: Baruch Goldmann, MD;  Location: [facility];  Service: Ophthalmology;  Laterality: Left;  CDE  4.96   CATARACT EXTRACTION W/PHACO Right 03/22/2020   Procedure: CATARACT EXTRACTION PHACO AND INTRAOCULAR LENS PLACEMENT RIGHT EYE;  Surgeon: Baruch Goldmann, MD;  Location: [facility];  Service: Ophthalmology;  Laterality: Right;  CDE 5.78   COLONOSCOPY  MJ 2009 pmhX: POLYPS   POLYP REMOVED BUT NOT RETRIEVED   COLONOSCOPY  LS 2007 ARS   ? POLYPS, no path available   COLONOSCOPY  09/26/2010   sessile poylp(2) pan-colonic diverticulosis,mild/internal hemorrhoids   COLONOSCOPY WITH PROPOFOL N/A 02/09/2017   Procedure: COLONOSCOPY WITH PROPOFOL;  Surgeon: Danie Binder, MD;  Location: [facility];   Service: Endoscopy;  Laterality: N/A;  8:30am   COLONOSCOPY WITH PROPOFOL N/A 04/26/2020   Procedure: COLONOSCOPY WITH PROPOFOL;  Surgeon: Eloise Harman, DO;  [facility];  Service: Endoscopy;  Laterality: N/A;  AM   ESOPHAGOGASTRODUODENOSCOPY (EGD) WITH PROPOFOL N/A 02/09/2017   Procedure: ESOPHAGOGASTRODUODENOSCOPY (EGD) WITH PROPOFOL;  Surgeon: Danie Binder, MD;  [facility];  Service: Endoscopy;  Laterality: N/A;   FOOT SURGERY Bilateral 2004   HERNIA REPAIR  1980's   left inguinal hernia   LYMPHADENECTOMY Bilateral 09/03/2014   Procedure: PELVIC LYMPHADENECTOMY;  Surgeon: Raynelle Bring, MD;  Location: [facility];  Service: Urology;  Laterality: Bilateral;   POLYPECTOMY  02/09/2017   Procedure: POLYPECTOMY;  Surgeon: Danie Binder, MD;  [facility];  Service: Endoscopy;;  cecal polyp, hepatic flexure polyps x3, transverse colon polyp, descending colon polyps x2, rectal polyps x2   POLYPECTOMY  04/26/2020   Procedure: POLYPECTOMY;  Surgeon: Eloise Harman, DO;  [facility];  Service: Endoscopy;;   PROSTATE BIOPSY  07/06/14   ROBOT ASSISTED LAPAROSCOPIC RADICAL PROSTATECTOMY N/A 09/03/2014   Procedure: ROBOTIC ASSISTED LAPAROSCOPIC RADICAL PROSTATECTOMY LEVEL 2;  Surgeon: Raynelle Bring, MD;  Location: [facility];  Service: Urology;  Laterality: N/A;    Family History  Problem Relation Age of Onset   Heart attack Father    Diabetes Sister    Hypertension Sister  Hypertension Sister    Aneurysm Sister        brain    Arthritis Other        family history    Colon cancer Neg Hx    Colon polyps Neg Hx     Social History   Tobacco Use   Smoking status: Every Day    Packs/day: 0.25    Years: 30.00    Pack years: 7.50    Types: Cigarettes   Smokeless tobacco: Never  Vaping Use   Vaping Use: Never used  Substance Use Topics   Alcohol use: Yes    Comment: 3 pints/month   Drug use: Yes    Types: Marijuana    Comment: occassionally     Medications: I have reviewed the patient's current medications. Allergies as of 02/18/2021       Reactions   Bee Venom    Codeine Nausea And Vomiting        Medication List        Accurate as of February 18, 2021 12:29 PM. If you have any questions, ask your nurse or doctor.          STOP taking these medications    benzonatate 100 MG capsule Commonly known as: TESSALON Stopped by: Virl Cagey, MD       TAKE these medications    acetaminophen 500 MG tablet Commonly known as: TYLENOL Take 1,000 mg by mouth every 6 (six) hours as needed for mild pain.   amLODipine 10 MG tablet Commonly known as: NORVASC Take 1 tablet (10 mg total) by mouth daily.   buPROPion 150 MG 12 hr tablet Commonly known as: Wellbutrin SR Take 1 tablet (150 mg total) by mouth 2 (two) times daily.   FLUoxetine 20 MG capsule Commonly known as: PROZAC Take 1 capsule (20 mg total) by mouth daily.   HYDROcodone-acetaminophen 5-325 MG tablet Commonly known as: NORCO/VICODIN Take 1 tablet by mouth every 6 (six) hours as needed for moderate pain.   olmesartan-hydrochlorothiazide 20-12.5 MG tablet Commonly known as: Benicar HCT Take 1 tablet by mouth daily.   omeprazole 20 MG capsule Commonly known as: PRILOSEC Take 1 capsule (20 mg total) by mouth daily. TAKE 1 CAPSULE BY MOUTH 30 MINUTES PRIOR TO BREAKFAST.   ONE DAILY MENS 50+ MULTIVIT PO Take 1 tablet by mouth daily.   polyethylene glycol 17 g packet Commonly known as: MiraLax Take 17 g by mouth daily.         ROS:  A comprehensive review of systems was negative except for: Gastrointestinal: positive for abdominal pain, reflux symptoms, and groin pain Musculoskeletal: positive for joint pain  Blood pressure 131/75, pulse (!) 52, temperature 97.9 F (36.6 C), temperature source Other (Comment), resp. rate 14, height 6\' 3"  (1.905 m), weight 198 lb (89.8 kg), SpO2 97 %. Physical Exam Vitals reviewed.   Constitutional:      Appearance: Normal appearance.  HENT:     Head: Normocephalic.     Nose: Nose normal.     Mouth/Throat:     Mouth: Mucous membranes are moist.  Cardiovascular:     Rate and Rhythm: Normal rate.  Pulmonary:     Effort: Pulmonary effort is normal.     Breath sounds: Normal breath sounds.  Abdominal:     General: There is no distension.     Palpations: Abdomen is soft.     Tenderness: There is no abdominal tenderness.     Hernia: A hernia is present. Hernia  is present in the right inguinal area. There is no hernia in the left inguinal area.     Comments: Reducible, tender right inguinal hernia  Musculoskeletal:        General: Normal range of motion.     Cervical back: Normal range of motion.  Skin:    General: Skin is warm.  Neurological:     General: No focal deficit present.     Mental Status: He is alert.  Psychiatric:        Mood and Affect: Mood normal.    Results: Personally reviewed CT right inguinal hernia with sigmoid colon  CLINICAL DATA:  Right lower quadrant pain since yesterday. Bowel obstruction suspected. Inguinal hernia. History of prostate and bladder cancer.   EXAM: CT ABDOMEN AND PELVIS WITH CONTRAST   TECHNIQUE: Multidetector CT imaging of the abdomen and pelvis was performed using the standard protocol following bolus administration of intravenous contrast.   RADIATION DOSE REDUCTION: This exam was performed according to the departmental dose-optimization program which includes automated exposure control, adjustment of the mA and/or kV according to patient size and/or use of iterative reconstruction technique.   CONTRAST:  140mL OMNIPAQUE IOHEXOL 300 MG/ML  SOLN   COMPARISON:  02/05/2021   FINDINGS: Lower chest: Emphysematous changes in the lung bases. No focal consolidation.   Hepatobiliary: Multiple hepatic cysts are unchanged since prior study. Gallbladder and bile ducts are unremarkable.   Pancreas:  Unremarkable. No pancreatic ductal dilatation or surrounding inflammatory changes.   Spleen: Normal in size without focal abnormality.   Adrenals/Urinary Tract: Multiple renal cysts are unchanged since prior study. No follow-up is indicated. Nephrograms are symmetrical. No hydronephrosis or hydroureter. No adrenal gland nodules. Bladder is unremarkable.   Stomach/Bowel: Large right inguinal hernia containing a portion of the sigmoid colon and fluid. On the previous study, the hernia contained terminal ileum. Possibly the hernia has been reduced in the interval and has recurred? No definite evidence of proximal obstruction although the proximal colon is diffusely stool-filled without significant dilatation. No wall thickening or inflammatory changes. Small bowel are decompressed. Appendix is normal.   Vascular/Lymphatic: Diffuse calcific and noncalcific plaque formation throughout the aortoiliac vessels. Infrarenal abdominal aortic aneurysm again demonstrated measuring 3.3 cm diameter. Bilateral iliac artery aneurysms, 1.8 cm on the right and 1.8 cm on the left. No change since previous study. No significant lymphadenopathy.   Reproductive: Prostate gland is surgically absent.   Other: No free air or free fluid in the abdomen.   Musculoskeletal: Degenerative changes in the spine. A few scattered tiny sclerotic bone lesions are demonstrated, unchanged since previous study from 07/18/2014. Degenerative changes in the hips.   IMPRESSION: 1. Persistent finding of a large right inguinal hernia containing a portion of the sigmoid colon and some fluid. No evidence of proximal dilatation although the proximal colon is diffusely stool-filled, which may indicate degree of obstruction. 2. Unchanged appearance of abdominal aortic and iliac artery aneurysms. Recommend follow-up ultrasound every 3 years. This recommendation follows ACR consensus guidelines: White Paper of the ACR Incidental  Findings Committee II on Vascular Findings. J Am Coll Radiol 2013; 10:789-794. 3. Aortic atherosclerosis. 4. Multiple benign-appearing renal and hepatic cyst. No follow-up is indicated.     Electronically Signed   By: Lucienne Capers M.D.   On: 02/13/2021 18:16     Assessment & Plan:  FORTINO HAAG is a 71 y.o. male with a right inguinal hernia. He is ready to  have this fixed.   Discussed the  risk and benefits including, bleeding, infection, use of mesh, risk of recurrence, risk of nerve damage causing numbness or changes in sensation, risk of damage to the cord structures. The patient understands the risk and benefits of repair with mesh, and has decided to proceed.  We also discussed open versus laparoscopic surgery and the use of mesh. We discussed that I do open repairs with mesh, and that this is considered equivalent to laparoscopic surgery. We discussed reasons for opting for laparoscopic surgery including if a bilateral repair is needed or if a patient has a recurrence after an open repair.   All questions were answered to the satisfaction of the patient.   Virl Cagey 02/18/2021, 12:29 PM

## 2021-02-21 ENCOUNTER — Ambulatory Visit (HOSPITAL_BASED_OUTPATIENT_CLINIC_OR_DEPARTMENT_OTHER): Payer: Medicare PPO | Admitting: Anesthesiology

## 2021-02-21 ENCOUNTER — Ambulatory Visit (HOSPITAL_COMMUNITY): Payer: Medicare PPO | Admitting: Anesthesiology

## 2021-02-21 ENCOUNTER — Ambulatory Visit (HOSPITAL_COMMUNITY)
Admission: RE | Admit: 2021-02-21 | Discharge: 2021-02-21 | Disposition: A | Discharge status: Home / Self Care | Payer: Medicare PPO | Source: Ambulatory Visit | Attending: General Surgery | Admitting: General Surgery

## 2021-02-21 ENCOUNTER — Encounter (HOSPITAL_COMMUNITY)
Admission: RE | Disposition: A | Discharge status: Home / Self Care | Payer: Self-pay | Source: Ambulatory Visit | Attending: General Surgery

## 2021-02-21 ENCOUNTER — Other Ambulatory Visit: Payer: Self-pay

## 2021-02-21 DIAGNOSIS — I10 Essential (primary) hypertension: Secondary | ICD-10-CM | POA: Diagnosis not present

## 2021-02-21 DIAGNOSIS — Z79899 Other long term (current) drug therapy: Secondary | ICD-10-CM | POA: Insufficient documentation

## 2021-02-21 DIAGNOSIS — K219 Gastro-esophageal reflux disease without esophagitis: Secondary | ICD-10-CM | POA: Diagnosis not present

## 2021-02-21 DIAGNOSIS — F17210 Nicotine dependence, cigarettes, uncomplicated: Secondary | ICD-10-CM | POA: Diagnosis not present

## 2021-02-21 DIAGNOSIS — Z8546 Personal history of malignant neoplasm of prostate: Secondary | ICD-10-CM | POA: Insufficient documentation

## 2021-02-21 DIAGNOSIS — M1991 Primary osteoarthritis, unspecified site: Secondary | ICD-10-CM | POA: Diagnosis not present

## 2021-02-21 DIAGNOSIS — K4090 Unilateral inguinal hernia, without obstruction or gangrene, not specified as recurrent: Secondary | ICD-10-CM

## 2021-02-21 DIAGNOSIS — Z8551 Personal history of malignant neoplasm of bladder: Secondary | ICD-10-CM | POA: Diagnosis not present

## 2021-02-21 DIAGNOSIS — F32A Depression, unspecified: Secondary | ICD-10-CM | POA: Diagnosis not present

## 2021-02-21 DIAGNOSIS — I739 Peripheral vascular disease, unspecified: Secondary | ICD-10-CM | POA: Insufficient documentation

## 2021-02-21 DIAGNOSIS — F419 Anxiety disorder, unspecified: Secondary | ICD-10-CM | POA: Insufficient documentation

## 2021-02-21 HISTORY — PX: INGUINAL HERNIA REPAIR: SHX194

## 2021-02-21 SURGERY — REPAIR, HERNIA, INGUINAL, ADULT
Anesthesia: General | Site: Inguinal | Laterality: Right

## 2021-02-21 MED ORDER — CHLORHEXIDINE GLUCONATE 0.12 % MT SOLN
15.0000 mL | Freq: Once | OROMUCOSAL | Status: AC
  Administered 2021-02-21: 15 mL via OROMUCOSAL

## 2021-02-21 MED ORDER — PROPOFOL 10 MG/ML IV BOLUS
INTRAVENOUS | Status: DC | PRN
  Administered 2021-02-21: 150 mg via INTRAVENOUS

## 2021-02-21 MED ORDER — ONDANSETRON HCL 4 MG PO TABS: 4.0000 mg | ORAL_TABLET | Freq: Three times a day (TID) | ORAL | 1 refills | Status: DC | PRN

## 2021-02-21 MED ORDER — PHENYLEPHRINE HCL (PRESSORS) 10 MG/ML IV SOLN
INTRAVENOUS | Status: DC | PRN
  Administered 2021-02-21: 100 ug via INTRAVENOUS

## 2021-02-21 MED ORDER — LIDOCAINE HCL (CARDIAC) PF 100 MG/5ML IV SOSY
PREFILLED_SYRINGE | INTRAVENOUS | Status: DC | PRN
  Administered 2021-02-21: 50 mg via INTRAVENOUS

## 2021-02-21 MED ORDER — BUPIVACAINE HCL (300 MG DOSE) 3 X 100 MG IL IMPL
DRUG_IMPLANT | Status: DC | PRN
  Administered 2021-02-21: 300 mg

## 2021-02-21 MED ORDER — ONDANSETRON HCL 4 MG/2ML IJ SOLN
INTRAMUSCULAR | Status: DC | PRN
  Administered 2021-02-21: 4 mg via INTRAVENOUS

## 2021-02-21 MED ORDER — EPHEDRINE SULFATE (PRESSORS) 50 MG/ML IJ SOLN
INTRAMUSCULAR | Status: DC | PRN
Start: 2021-02-21 — End: 2021-02-21
  Administered 2021-02-21 (×2): 5 mg via INTRAVENOUS

## 2021-02-21 MED ORDER — MIDAZOLAM HCL 2 MG/2ML IJ SOLN
INTRAMUSCULAR | Status: AC
  Filled 2021-02-21: qty 2

## 2021-02-21 MED ORDER — DEXAMETHASONE SODIUM PHOSPHATE 10 MG/ML IJ SOLN
INTRAMUSCULAR | Status: AC
  Filled 2021-02-21: qty 1

## 2021-02-21 MED ORDER — CEFAZOLIN SODIUM-DEXTROSE 2-4 GM/100ML-% IV SOLN
2.0000 g | INTRAVENOUS | Status: AC
  Administered 2021-02-21: 2 g via INTRAVENOUS

## 2021-02-21 MED ORDER — FENTANYL CITRATE (PF) 250 MCG/5ML IJ SOLN
INTRAMUSCULAR | Status: AC
  Filled 2021-02-21: qty 5

## 2021-02-21 MED ORDER — DEXAMETHASONE SODIUM PHOSPHATE 10 MG/ML IJ SOLN
INTRAMUSCULAR | Status: DC | PRN
  Administered 2021-02-21: 10 mg via INTRAVENOUS

## 2021-02-21 MED ORDER — SUGAMMADEX SODIUM 500 MG/5ML IV SOLN
INTRAVENOUS | Status: DC | PRN
Start: 2021-02-21 — End: 2021-02-21
  Administered 2021-02-21: 200 mg via INTRAVENOUS

## 2021-02-21 MED ORDER — BUPIVACAINE HCL (300 MG DOSE) 3 X 100 MG IL IMPL
DRUG_IMPLANT | Status: AC
  Filled 2021-02-21: qty 100

## 2021-02-21 MED ORDER — MIDAZOLAM HCL 5 MG/5ML IJ SOLN
INTRAMUSCULAR | Status: DC | PRN
Start: 2021-02-21 — End: 2021-02-21
  Administered 2021-02-21: 2 mg via INTRAVENOUS

## 2021-02-21 MED ORDER — CEFAZOLIN SODIUM-DEXTROSE 2-4 GM/100ML-% IV SOLN
INTRAVENOUS | Status: AC
  Filled 2021-02-21: qty 100

## 2021-02-21 MED ORDER — FENTANYL CITRATE (PF) 100 MCG/2ML IJ SOLN
INTRAMUSCULAR | Status: DC | PRN
  Administered 2021-02-21: 50 ug via INTRAVENOUS
  Administered 2021-02-21 (×2): 100 ug via INTRAVENOUS

## 2021-02-21 MED ORDER — ONDANSETRON HCL 4 MG/2ML IJ SOLN: 4.0000 mg | Freq: Once | INTRAMUSCULAR | Status: DC | PRN

## 2021-02-21 MED ORDER — ORAL CARE MOUTH RINSE: 15.0000 mL | Freq: Once | OROMUCOSAL | Status: AC

## 2021-02-21 MED ORDER — ONDANSETRON HCL 4 MG/2ML IJ SOLN
INTRAMUSCULAR | Status: AC
  Filled 2021-02-21: qty 2

## 2021-02-21 MED ORDER — HYDROMORPHONE HCL 1 MG/ML IJ SOLN: 0.2500 mg | INTRAMUSCULAR | Status: DC | PRN

## 2021-02-21 MED ORDER — LACTATED RINGERS IV SOLN: INTRAVENOUS | Status: DC

## 2021-02-21 MED ORDER — ROCURONIUM BROMIDE 10 MG/ML (PF) SYRINGE
PREFILLED_SYRINGE | INTRAVENOUS | Status: AC
  Filled 2021-02-21: qty 10

## 2021-02-21 MED ORDER — SODIUM CHLORIDE 0.9 % IR SOLN
Status: DC | PRN
  Administered 2021-02-21: 1000 mL

## 2021-02-21 MED ORDER — ROCURONIUM BROMIDE 100 MG/10ML IV SOLN
INTRAVENOUS | Status: DC | PRN
  Administered 2021-02-21: 50 mg via INTRAVENOUS

## 2021-02-21 MED ORDER — KETOROLAC TROMETHAMINE 30 MG/ML IJ SOLN
15.0000 mg | Freq: Once | INTRAMUSCULAR | Status: AC
  Administered 2021-02-21: 15 mg via INTRAVENOUS
  Filled 2021-02-21: qty 1

## 2021-02-21 MED ORDER — HYDROCODONE-ACETAMINOPHEN 5-325 MG PO TABS: 1.0000 | ORAL_TABLET | ORAL | 0 refills | Status: DC | PRN

## 2021-02-21 MED ORDER — CHLORHEXIDINE GLUCONATE CLOTH 2 % EX PADS: 6.0000 | MEDICATED_PAD | Freq: Once | CUTANEOUS | Status: DC

## 2021-02-21 MED ORDER — PHENYLEPHRINE 40 MCG/ML (10ML) SYRINGE FOR IV PUSH (FOR BLOOD PRESSURE SUPPORT)
PREFILLED_SYRINGE | INTRAVENOUS | Status: AC
  Filled 2021-02-21: qty 20

## 2021-02-21 SURGICAL SUPPLY — 33 items
ADH SKN CLS APL DERMABOND .7 (GAUZE/BANDAGES/DRESSINGS) ×1
CLOTH BEACON ORANGE TIMEOUT ST (SAFETY) ×2 IMPLANT
COVER LIGHT HANDLE STERIS (MISCELLANEOUS) ×4 IMPLANT
DERMABOND ADVANCED (GAUZE/BANDAGES/DRESSINGS) ×1
DERMABOND ADVANCED .7 DNX12 (GAUZE/BANDAGES/DRESSINGS) ×1 IMPLANT
DRAIN PENROSE 0.5X18 (DRAIN) ×2 IMPLANT
ELECT REM PT RETURN 9FT ADLT (ELECTROSURGICAL) ×2
ELECTRODE REM PT RTRN 9FT ADLT (ELECTROSURGICAL) ×1 IMPLANT
GAUZE 4X4 16PLY ~~LOC~~+RFID DBL (SPONGE) ×1 IMPLANT
GAUZE SPONGE 4X4 12PLY STRL (GAUZE/BANDAGES/DRESSINGS) ×2 IMPLANT
GLOVE SURG ENC MOIS LTX SZ6.5 (GLOVE) ×2 IMPLANT
GLOVE SURG POLYISO LF SZ6.5 (GLOVE) ×2 IMPLANT
GLOVE SURG POLYISO LF SZ7 (GLOVE) ×1 IMPLANT
GLOVE SURG UNDER POLY LF SZ6.5 (GLOVE) ×1 IMPLANT
GLOVE SURG UNDER POLY LF SZ7 (GLOVE) ×8 IMPLANT
GOWN STRL REUS W/TWL LRG LVL3 (GOWN DISPOSABLE) ×6 IMPLANT
INST SET MINOR GENERAL (KITS) ×2 IMPLANT
KIT TURNOVER KIT A (KITS) ×2 IMPLANT
MANIFOLD NEPTUNE II (INSTRUMENTS) ×2 IMPLANT
MESH HERNIA 1.6X1.9 PLUG LRG (Mesh General) IMPLANT
MESH HERNIA PLUG LRG (Mesh General) ×1 IMPLANT
NS IRRIG 1000ML POUR BTL (IV SOLUTION) ×2 IMPLANT
PACK MINOR (CUSTOM PROCEDURE TRAY) ×2 IMPLANT
PAD ARMBOARD 7.5X6 YLW CONV (MISCELLANEOUS) ×2 IMPLANT
SET BASIN LINEN APH (SET/KITS/TRAYS/PACK) ×2 IMPLANT
SOL PREP PROV IODINE SCRUB 4OZ (MISCELLANEOUS) ×2 IMPLANT
SUT MNCRL AB 4-0 PS2 18 (SUTURE) ×2 IMPLANT
SUT NOVA NAB GS-22 2 2-0 T-19 (SUTURE) ×3 IMPLANT
SUT SILK 2 0 FSL 18 (SUTURE) ×2 IMPLANT
SUT VIC AB 2-0 CT1 27 (SUTURE) ×4
SUT VIC AB 2-0 CT1 TAPERPNT 27 (SUTURE) ×1 IMPLANT
SUT VIC AB 3-0 SH 27 (SUTURE) ×6
SUT VIC AB 3-0 SH 27X BRD (SUTURE) ×1 IMPLANT

## 2021-02-21 NOTE — Anesthesia Preprocedure Evaluation (Signed)
Anesthesia Evaluation  Patient identified by MRN, date of birth, ID band Patient awake    Reviewed: Allergy & Precautions, NPO status , Patient's Chart, lab work & pertinent test results  History of Anesthesia Complications Negative for: history of anesthetic complications  Airway Mallampati: II  TM Distance: >3 FB Neck ROM: Full    Dental  (+) Dental Advisory Given, Missing   Pulmonary Current Smoker and Patient abstained from smoking.,    Pulmonary exam normal breath sounds clear to auscultation       Cardiovascular Exercise Tolerance: Good hypertension, Pt. on medications + Peripheral Vascular Disease  Normal cardiovascular exam Rhythm:Regular Rate:Normal     Neuro/Psych  Headaches, PSYCHIATRIC DISORDERS Anxiety Depression    GI/Hepatic GERD  Medicated,(+)     substance abuse  marijuana use,   Endo/Other  negative endocrine ROS  Renal/GU negative Renal ROS Bladder dysfunction (bladder cancer, prostate cancer)      Musculoskeletal  (+) Arthritis , Osteoarthritis,    Abdominal   Peds  Hematology negative hematology ROS (+)   Anesthesia Other Findings   Reproductive/Obstetrics negative OB ROS                           Anesthesia Physical Anesthesia Plan  ASA: 2  Anesthesia Plan: General   Post-op Pain Management: Dilaudid IV   Induction: Intravenous  PONV Risk Score and Plan: 3 and Ondansetron and Dexamethasone  Airway Management Planned: Oral ETT  Additional Equipment:   Intra-op Plan:   Post-operative Plan: Extubation in OR  Informed Consent: I have reviewed the patients History and Physical, chart, labs and discussed the procedure including the risks, benefits and alternatives for the proposed anesthesia with the patient or authorized representative who has indicated his/her understanding and acceptance.     Dental advisory given  Plan Discussed with: CRNA and  Surgeon  Anesthesia Plan Comments:        Anesthesia Quick Evaluation

## 2021-02-21 NOTE — Op Note (Signed)
[facility] Operative Note  02/21/21  Preoperative Diagnosis: Right inguinal hernia    Postoperative Diagnosis: Same   Procedure(s) Performed: Right inguinal hernia repair with mesh   Surgeon: Lanell Matar. Constance Haw, MD   Assistants: No qualified resident was available   Anesthesia: General endotracheal   Anesthesiologist: Denese Killings, MD    Specimens: None   Estimated Blood Loss: Minimal   Blood Replacement: None    Complications: None   Wound Class: Clean   Operative Indications: Matthew Adkins is a 71 yo with a hernia that has been growing for several months. Discussed repair with mesh and risk of bleeding, infection, recurrence, nerve or cord injury.   Findings: Indirect hernia, thin sac    Procedure: The patient was taken to the operating room and placed supine. General endotracheal anesthesia was induced. Intravenous antibiotics were  administered per protocol.  A time out was preformed verifying the correct patient, procedure, site, positioning and implants.  The right groin and scrotum were prepared and draped in the usual sterile fashion.   An incision was made in a natural skin crease between the pubic tubercle and the anterior superior iliac spine.  The incision was deepened with electrocautery through Scarpa's and Camper's fascia until the aponeurosis of the external oblique was encountered.  This was cleaned and the external ring was exposed.  An incision was made in the midportion of the external oblique aponeurosis in the direction of its fibers. The ilioinguinal nerve was  identified and was protected throughout the dissection.  Flaps of the external oblique were developed cephalad and inferiorly.    The cord was identified and it was gently dissented free at the pubic tubercle and encircled with a Penrose drain.  Attention was then directed at the anteromedial aspect of the cord, where an indirect hernia sac was identified.  The sac was  carefully dissected free from the cord down to the level of the internal ring.  The sac was large and very thin. I got into the sac several times, all the bowel was reduced. . The vas and testicular vessels were identified and protected from harm.  Once the sac was dissected free from the cords, the Penrose was placed around the cord which was retracted inferiorly out of the field of view.  I suture ligated the sac with a 2 -0 silk suture The remaining hernia was scored on the edges and reduced into the internal ring without difficulty.  A large Perfix Plug was placed into the defect and filled the space.  Attention was then turned to the floor of the canal, which was grossly weakened without any defined defect or sac.  The Perfix Mesh Patch was sutured to the inguinal ligament inferiorly starting at the pubic tubercle using 2-0 Novafil interrupted sutures.  The mesh was sutured superiorly to the conjoint tendon using 2-0 Novafil interrupted sutures.  Care was taken to ensure the mesh was placed in a relaxed fashion to avoid excessive tension and no neurovascular structures were caught in the repair.  Laterally the tails of the mesh were crossed and the internal ring was recreated, allowing for passage of cords without tension. Xaracoll was placed in layers throughout the repair.   Hemostasis was adequate.  The Penrose was removed.  The external oblique aponeurosis was closed with a 2-0 Vicryl suture in a running fashion, taking care to not catch the ilioinguinal nerve in the suture line.  Scarpa's fashion was closed with 3-0 Vicryl interrupted sutures. The  skin was closed with a subcuticular 4-0 Monocryl suture.  Dermabond was applied.   The testis was gently pulled down into its anatomic position in the scrotum.  The patient tolerated the procedure well and was taken to the PACU in stable condition. All counts were correct at the end of the case.        Matthew Labrum, MD [facility] [address] [place], [place] [address] [phone] (office)

## 2021-02-21 NOTE — Anesthesia Postprocedure Evaluation (Signed)
Anesthesia Post Note  Patient: Matthew Adkins  Procedure(s) Performed: HERNIA REPAIR INGUINAL ADULT w/ MESH (Right: Inguinal)  Patient location during evaluation: Phase II Anesthesia Type: General Level of consciousness: awake and alert and oriented Pain management: pain level controlled Vital Signs Assessment: post-procedure vital signs reviewed and stable Respiratory status: spontaneous breathing, nonlabored ventilation and respiratory function stable Cardiovascular status: blood pressure returned to baseline and stable Postop Assessment: no apparent nausea or vomiting Anesthetic complications: no   No notable events documented.   Last Vitals:  Vitals:   02/21/21 1300 02/21/21 1315  BP: 126/79 129/72  Pulse: 60 64  Resp: 15 19  Temp:    SpO2: 98% 98%    Last Pain:  Vitals:   02/21/21 1300  TempSrc:   PainSc: 3                  Eular Panek C Alecxis Baltzell

## 2021-02-21 NOTE — Interval H&P Note (Signed)
History and Physical Interval Note:  02/21/2021 10:21 AM  Matthew Adkins  has presented today for surgery, with the diagnosis of Right Inguinal Hernia.  The various methods of treatment have been discussed with the patient and family. After consideration of risks, benefits and other options for treatment, the patient has consented to  Procedure(s): HERNIA REPAIR INGUINAL ADULT w/ MESH (Right) as a surgical intervention.  The patient's history has been reviewed, patient examined, no change in status, stable for surgery.  I have reviewed the patient's chart and labs.  Questions were answered to the patient's satisfaction.   Marked.   Virl Cagey

## 2021-02-21 NOTE — Progress Notes (Signed)
[facility]  Updated son. Rx sent in. Will see 3/15. Needs to urinate prior to dc.  Curlene Labrum, MD [facility] [address] [place], [place] [address] [phone] (office)

## 2021-02-21 NOTE — Transfer of Care (Signed)
Immediate Anesthesia Transfer of Care Note  Patient: Matthew Adkins  Procedure(s) Performed: HERNIA REPAIR INGUINAL ADULT w/ MESH (Right: Inguinal)  Patient Location: PACU  Anesthesia Type:General  Level of Consciousness: awake, drowsy and patient cooperative  Airway & Oxygen Therapy: Patient Spontanous Breathing and Patient connected to nasal cannula oxygen  Post-op Assessment: Report given to RN, Post -op Vital signs reviewed and stable and Patient moving all extremities X 4  Post vital signs: Reviewed and stable  Last Vitals:  Vitals Value Taken Time  BP 138/76 02/21/21 1225  Temp    Pulse 65 02/21/21 1226  Resp 17 02/21/21 1226  SpO2 97 % 02/21/21 1226  Vitals shown include unvalidated device data.  Last Pain:  Vitals:   02/21/21 0940  TempSrc: Oral  PainSc: 0-No pain      Patients Stated Pain Goal: 7 (45/36/46 8032)  Complications: No notable events documented.

## 2021-02-21 NOTE — Discharge Instructions (Addendum)
Discharge Instructions Hernia:  Common Complaints: Pain at the incision site is common. This will improve with time. Take your pain medications as described below. Some nausea is common and poor appetite. The main goal is to stay hydrated the first few days after surgery.  Numbness at the incision or the thigh is common.  If you start to have burning or tingling pain in your groin, this is from a nerve being pinched. Please call and we can prescribe you a different type of pain medication for nerve pain.   Diet/ Activity: Diet as tolerated. You may not have an appetite, but it is important to stay hydrated. Drink 64 ounces of water a day. Your appetite will return with time.  Shower per your regular routine daily.  Do not take hot showers. Take warm showers that are less than 10 minutes. Some bruising on that side and into the scrotum is common, if you have more than that let us know. Place a towel under your scrotum to help with swelling.  Rest and listen to your body, but do not remain in bed all day.  Walk everyday for at least 15-20 minutes. Deep cough and move around every 1-2 hours in the first few days after surgery.  Do not pick at the dermabond glue on your incision sites.  This glue film will remain in place for 1-2 weeks and will start to peel off.  Do not place lotions or balms on your incision unless instructed to specifically by Dr. Constance Haw.  Do not lift > 10 lbs, perform excessive bending, pushing, pulling, squatting for 6-8 weeks after surgery.   Pain Expectations and Narcotics: -After surgery you will have pain associated with your incisions and this is normal. The pain is muscular and nerve pain, and will get better with time. -You are encouraged and expected to take non narcotic medications like tylenol and ibuprofen (when able) to treat pain as multiple modalities can aid with pain treatment. -Narcotics are only used when pain is severe or there is breakthrough pain. -You  are not expected to have a pain score of 0 after surgery, as we cannot prevent pain. A pain score of 3-4 that allows you to be functional, move, walk, and tolerate some activity is the goal. The pain will continue to improve over the days after surgery and is dependent on your surgery. -Due to [place] law, we are only able to give a certain amount of pain medication to treat post operative pain, and we only give additional narcotics on a patient by patient basis.  -For most laparoscopic surgery, studies have shown that the majority of patients only need 10-15 narcotic pills, and for open surgeries most patients only need 15-20.   -Having appropriate expectations of pain and knowledge of pain management with non narcotics is important as we do not want anyone to become addicted to narcotic pain medication.  -Using ice packs in the first 48 hours and heating pads after 48 hours, wearing an abdominal binder (when recommended), and using over the counter medications are all ways to help with pain management.   -Simple acts like meditation and mindfulness practices after surgery can also help with pain control and research has proven the benefit of these practices.  Medication: Take tylenol and ibuprofen as needed for pain control, alternating every 4-6 hours.  Example:  Tylenol 1000mg  @ 6am, 12noon, 6pm, 64midnight (Do not exceed 4000mg  of tylenol a day). Ibuprofen 800mg  @ 9am, 3pm, 9pm, 3am (Do not  exceed 3600mg  of ibuprofen a day).  Take Norco for breakthrough pain every 4 hours.  Take Colace for constipation related to narcotic pain medication. If you do not have a bowel movement in 2 days, take Miralax over the counter.  Drink plenty of water to also prevent constipation.   Contact Information: If you have questions or concerns, please call our office, [phone], Monday- Thursday 8AM-5PM and Friday 8AM-12Noon.  If it is after hours or on the weekend, please call [facility] Main Number, [phone],  [phone], and ask to speak to the surgeon on call for Dr. Constance Haw at [facility].

## 2021-02-21 NOTE — Anesthesia Procedure Notes (Signed)
Procedure Name: Intubation Date/Time: 02/21/2021 10:50 AM Performed by: Jonna Munro, CRNA Pre-anesthesia Checklist: Patient identified, Emergency Drugs available, Suction available, Patient being monitored and Timeout performed Patient Re-evaluated:Patient Re-evaluated prior to induction Oxygen Delivery Method: Circle system utilized Preoxygenation: Pre-oxygenation with 100% oxygen Induction Type: IV induction Laryngoscope Size: Mac and 3 Grade View: Grade I Tube type: Oral Tube size: 7.5 mm Number of attempts: 1 Airway Equipment and Method: Stylet Placement Confirmation: ETT inserted through vocal cords under direct vision, positive ETCO2, CO2 detector and breath sounds checked- equal and bilateral Secured at: 23 cm Tube secured with: Tape Dental Injury: Teeth and Oropharynx as per pre-operative assessment

## 2021-02-25 ENCOUNTER — Encounter (HOSPITAL_COMMUNITY): Payer: Self-pay | Admitting: General Surgery

## 2021-02-26 ENCOUNTER — Other Ambulatory Visit: Payer: Self-pay

## 2021-02-26 ENCOUNTER — Encounter: Payer: Self-pay | Admitting: Vascular Surgery

## 2021-02-26 ENCOUNTER — Ambulatory Visit: Payer: Medicare PPO | Admitting: Vascular Surgery

## 2021-02-26 VITALS — BP 120/72 | HR 65 | Temp 98.4°F | Resp 14 | Ht 75.0 in | Wt 195.5 lb

## 2021-02-26 DIAGNOSIS — I7143 Infrarenal abdominal aortic aneurysm, without rupture: Secondary | ICD-10-CM | POA: Diagnosis not present

## 2021-02-26 NOTE — Progress Notes (Signed)
[facility] [place]  Patient name: Matthew Adkins MRN: [id number] DOB: 04/12/1950 Sex: male  REASON FOR CONSULT: Evaluation abdominal aortic aneurysm and bilateral common iliac artery aneurysms  HPI: Matthew Adkins is a 71 y.o. male, who is here today for discussion of abdominal and iliac artery aneurysms.  He underwent CT scan was found to have a inguinal hernia which was repaired last Friday by Dr. Constance Haw.  He had incidental finding of a 3.3 cm infrarenal aneurysm and 1.8 cm common iliac artery aneurysms bilaterally.  He had no prior knowledge of this.  Does have a past history of bladder and prostate cancer.  Past Medical History:  Diagnosis Date   Bladder cancer ([place]) 2000   Depression    Essential hypertension 01/16/2018   Nicotine addiction    Prostate cancer ([place]) 07/06/2014   Seasonal allergies     Family History  Problem Relation Age of Onset   Heart attack Father    Diabetes Sister    Hypertension Sister    Hypertension Sister    Aneurysm Sister        brain    Arthritis Other        family history    Colon cancer Neg Hx    Colon polyps Neg Hx     SOCIAL HISTORY: Social History   Socioeconomic History   Marital status: Married    Spouse name: Not on file   Number of children: 2   Years of education: Not on file   Highest education level: Not on file  Occupational History   Occupation: unemployed   Tobacco Use   Smoking status: Every Day    Packs/day: 0.25    Years: 30.00    Pack years: 7.50    Types: Cigarettes   Smokeless tobacco: Never  Vaping Use   Vaping Use: Never used  Substance and Sexual Activity   Alcohol use: Yes    Comment: 3 pints/month   Drug use: Yes    Types: Marijuana    Comment: occassionally   Sexual activity: Yes    Birth control/protection: None  Other Topics Concern   Not on file  Social History Narrative   Not on file   Social Determinants of Health    Financial Resource Strain: Not on file  Food Insecurity: Not on file  Transportation Needs: No Transportation Needs   Lack of Transportation (Medical): No   Lack of Transportation (Non-Medical): No  Physical Activity: Not on file  Stress: Not on file  Social Connections: Moderately Isolated   Frequency of Communication with Friends and Family: Never   Frequency of Social Gatherings with Friends and Family: Never   Attends Religious Services: Never   Marine scientist or Organizations: Yes   Attends Music therapist: More than 4 times per year   Marital Status: Married  Human resources officer Violence: Not on file    Allergies  Allergen Reactions   Bee Venom Nausea Only    Dizziness    Codeine Nausea And Vomiting   Lime Oil Rash    Current Outpatient Medications  Medication Sig Dispense Refill   acetaminophen (TYLENOL) 500 MG tablet Take 1,000 mg by mouth every 6 (six) hours as needed for mild pain.     amLODipine (NORVASC) 10 MG tablet Take 1 tablet (10 mg total) by mouth daily. 90 tablet 3   buPROPion (WELLBUTRIN SR) 150 MG 12 hr tablet Take 1 tablet (150 mg total) by  mouth 2 (two) times daily. 60 tablet 3   Carboxymethylcellul-Glycerin (LUBRICATING EYE DROPS OP) Place 1 drop into both eyes daily as needed (dry eyes).     FLUoxetine (PROZAC) 20 MG capsule Take 1 capsule (20 mg total) by mouth daily. 90 capsule 3   HYDROcodone-acetaminophen (NORCO/VICODIN) 5-325 MG tablet Take 1 tablet by mouth every 4 (four) hours as needed for severe pain. 20 tablet 0   Multiple Vitamins-Minerals (ONE DAILY MENS 50+ MULTIVIT PO) Take 1 tablet by mouth daily.     olmesartan-hydrochlorothiazide (BENICAR HCT) 20-12.5 MG tablet Take 1 tablet by mouth daily. 30 tablet 3   omeprazole (PRILOSEC) 20 MG capsule Take 1 capsule (20 mg total) by mouth daily. TAKE 1 CAPSULE BY MOUTH 30 MINUTES PRIOR TO BREAKFAST. 90 capsule 3   ondansetron (ZOFRAN) 4 MG tablet Take 1 tablet (4 mg total) by  mouth every 8 (eight) hours as needed. 30 tablet 1   polyethylene glycol (MIRALAX) 17 g packet Take 17 g by mouth daily. (Patient taking differently: Take 17 g by mouth daily as needed for mild constipation.) 14 each 0   No current facility-administered medications for this visit.    REVIEW OF SYSTEMS:  [X]  denotes positive finding, [ ]  denotes negative finding Cardiac  Comments:  Chest pain or chest pressure:    Shortness of breath upon exertion:    Short of breath when lying flat:    Irregular heart rhythm:        Vascular    Pain in calf, thigh, or hip brought on by ambulation:    Pain in feet at night that wakes you up from your sleep:     Blood clot in your veins:    Leg swelling:         Pulmonary    Oxygen at home:    Productive cough:     Wheezing:         Neurologic    Sudden weakness in arms or legs:     Sudden numbness in arms or legs:     Sudden onset of difficulty speaking or slurred speech:    Temporary loss of vision in one eye:     Problems with dizziness:         Gastrointestinal    Blood in stool:     Vomited blood:         Genitourinary    Burning when urinating:     Blood in urine:        Psychiatric    Major depression:         Hematologic    Bleeding problems:    Problems with blood clotting too easily:        Skin    Rashes or ulcers:        Constitutional    Fever or chills:      PHYSICAL EXAM: Vitals:   02/26/21 1510  BP: 120/72  Pulse: 65  Resp: 14  Temp: 98.4 F (36.9 C)  TempSrc: Temporal  SpO2: 97%  Weight: 195 lb 8 oz (88.7 kg)  Height: 6\' 3"  (1.905 m)    GENERAL: The patient is a well-nourished male, in no acute distress. The vital signs are documented above. CARDIOVASCULAR: 2+ radial, 2+ femoral, 2+ popliteal pulses with no evidence of peripheral artery aneurysms.  Abdomen is soft and nontender.  I do not palpate an aneurysm PULMONARY: There is good air exchange  MUSCULOSKELETAL: There are no major deformities or  cyanosis. NEUROLOGIC: No  focal weakness or paresthesias are detected. SKIN: There are no ulcers or rashes noted. PSYCHIATRIC: The patient has a normal affect.  DATA:  Reviewed his CT scan which showed small infrarenal aneurysm and common iliac artery aneurysms bilaterally  MEDICAL ISSUES: I discussed these findings with the patient.  Explained that threshold for repair of aneurysm is typically 5.5 cm unless he has rapid expansion.  It is very small aneurysm, would recommend ultrasound follow-up in 2 years.  We will coordinate this through our office.  He understands he needs to have no restrictions in his activity related to his aneurysm.   Rosetta Posner, MD FACS [facility] [phone] Pager [phone]  Note: Portions of this report may have been transcribed using voice recognition software.  Every effort has been made to ensure accuracy; however, inadvertent computerized transcription errors may still be present.

## 2021-03-03 ENCOUNTER — Other Ambulatory Visit: Payer: Self-pay | Admitting: Gastroenterology

## 2021-03-03 DIAGNOSIS — K219 Gastro-esophageal reflux disease without esophagitis: Secondary | ICD-10-CM

## 2021-03-03 DIAGNOSIS — D126 Benign neoplasm of colon, unspecified: Secondary | ICD-10-CM

## 2021-03-03 NOTE — Telephone Encounter (Signed)
Last office visit 03/07/20 with Walden Field

## 2021-03-04 ENCOUNTER — Ambulatory Visit: Payer: Medicare PPO | Admitting: Family Medicine

## 2021-03-19 ENCOUNTER — Other Ambulatory Visit: Payer: Self-pay

## 2021-03-19 ENCOUNTER — Encounter: Payer: Self-pay | Admitting: General Surgery

## 2021-03-19 ENCOUNTER — Ambulatory Visit (INDEPENDENT_AMBULATORY_CARE_PROVIDER_SITE_OTHER): Payer: Medicare PPO | Admitting: General Surgery

## 2021-03-19 VITALS — BP 127/75 | HR 56 | Temp 97.4°F | Resp 16 | Ht 75.0 in | Wt 199.0 lb

## 2021-03-19 DIAGNOSIS — K4090 Unilateral inguinal hernia, without obstruction or gangrene, not specified as recurrent: Secondary | ICD-10-CM

## 2021-03-19 NOTE — Patient Instructions (Addendum)
No heavy lifting > 10 lbs, excessive bending, pushing, pulling, or squatting for 6-8 weeks after surgery.  ?Ok to start golfing, start slow. ? ?Buy Silicone Gel Sheets at the pharmacy/ can buy the pharmacy brand of them. ?Use on the area for the next 2-3 months to prevent keloid. Use the instructions on the box.   ?

## 2021-03-20 NOTE — Progress Notes (Signed)
[facility] ? ?Doing well. Wanting to golf. Feels good. Worried about keloid. ? ?BP 127/75   Pulse (!) 56   Temp (!) 97.4 ?F (36.3 ?C) (Other (Comment))   Resp 16   Ht '6\' 3"'$  (1.905 m)   Wt 199 lb (90.3 kg)   SpO2 92%   BMI 24.87 kg/m?  ?Right inguinal incision with minor induration, no overt keloid at this time ? ?Patient s/p R inguinal hernia repair with mesh. Doing well.  ?No heavy lifting > 10 lbs, excessive bending, pushing, pulling, or squatting for 6-8 weeks after surgery.  ?Ok to start golfing, start slow. ? ?Buy Silicone Gel Sheets at the pharmacy/ can buy the pharmacy brand of them. ?Use on the area for the next 2-3 months to prevent keloid. Use the instructions on the box.   ? ?PRN Follow up ? ?Curlene Labrum, MD ?[facility] ?[place], [place] [address] ?[phone] (office) ? ?

## 2021-03-31 ENCOUNTER — Other Ambulatory Visit: Payer: Self-pay

## 2021-04-03 ENCOUNTER — Other Ambulatory Visit: Payer: Self-pay | Admitting: Family Medicine

## 2021-04-03 ENCOUNTER — Ambulatory Visit (INDEPENDENT_AMBULATORY_CARE_PROVIDER_SITE_OTHER): Payer: Medicare PPO

## 2021-04-03 DIAGNOSIS — Z0000 Encounter for general adult medical examination without abnormal findings: Secondary | ICD-10-CM | POA: Diagnosis not present

## 2021-04-03 NOTE — Patient Instructions (Signed)
?  Matthew Adkins , ?Thank you for taking time to come for your Medicare Wellness Visit. I appreciate your ongoing commitment to your health goals. Please review the following plan we discussed and let me know if I can assist you in the future.  ? ?These are the goals we discussed: ? Goals   ? ?  Exercise 3x per week (30 min per time)   ?  LIFESTYLE - DECREASE FALLS RISK   ?  Patient Stated   ?  Patient states that his goal is to stay alive ?  ?  Quit smoking / using tobacco   ?  patient would like to quit smoking- has cut back recently  ?  ? ?  ?  ?This is a list of the screening recommended for you and due dates:  ?Health Maintenance  ?Topic Date Due  ? Zoster (Shingles) Vaccine (1 of 2) Never done  ? Tetanus Vaccine  10/22/2019  ? COVID-19 Vaccine (3 - Moderna risk series) 12/21/2019  ? Colon Cancer Screening  04/27/2030  ? Pneumonia Vaccine  Completed  ? Flu Shot  Completed  ? Hepatitis C Screening: USPSTF Recommendation to screen - Ages 54-79 yo.  Completed  ? HPV Vaccine  Aged Out  ?  ?

## 2021-04-03 NOTE — Progress Notes (Signed)
? ?Subjective:  ? Matthew Adkins is a 71 y.o. male who presents for Medicare Annual/Subsequent preventive examination. ?I connected with  [name] on 04/03/21 by a audio enabled telemedicine application and verified that I am speaking with the correct person using two identifiers. ? ?Patient Location: Home ? ?Provider Location: Office/Clinic ? ?I discussed the limitations of evaluation and management by telemedicine. The patient expressed understanding and agreed to proceed.  ?Review of Systems    ? ?Cardiac Risk Factors include: diabetes mellitus;advanced age (>70mn, >>57women);male gender;hypertension ? ?   ?Objective:  ?  ?There were no vitals filed for this visit. ?There is no height or weight on file to calculate BMI. ? ? ?  04/03/2021  ?  9:05 AM 02/20/2021  ?  1:08 PM 02/13/2021  ?  4:09 PM 04/26/2020  ?  8:38 AM 03/22/2020  ?  8:32 AM 03/01/2020  ? 11:55 AM 11/09/2019  ? 12:00 PM  ?Advanced Directives  ?Does Patient Have a Medical Advance Directive? No Yes No No No No No  ?Type of ACorporate treasurerof Attorney       ?Does patient want to make changes to medical advance directive?  No - Patient declined       ?Would patient like information on creating a medical advance directive? Yes (ED - Information included in AVS) No - Patient declined No - Patient declined No - Patient declined No - Patient declined No - Patient declined Yes (Inpatient - patient requests chaplain consult to create a medical advance directive)  ? ? ?Current Medications (verified) ?Outpatient Encounter Medications as of 04/03/2021  ?Medication Sig  ? acetaminophen (TYLENOL) 500 MG tablet Take 1,000 mg by mouth every 6 (six) hours as needed for mild pain.  ? amLODipine (NORVASC) 10 MG tablet Take 1 tablet (10 mg total) by mouth daily.  ? buPROPion (WELLBUTRIN SR) 150 MG 12 hr tablet Take 1 tablet (150 mg total) by mouth 2 (two) times daily.  ? Carboxymethylcellul-Glycerin (LUBRICATING EYE DROPS OP) Place 1 drop into  both eyes daily as needed (dry eyes).  ? FLUoxetine (PROZAC) 20 MG capsule Take 1 capsule (20 mg total) by mouth daily.  ? Multiple Vitamins-Minerals (ONE DAILY MENS 50+ MULTIVIT PO) Take 1 tablet by mouth daily.  ? olmesartan-hydrochlorothiazide (BENICAR HCT) 20-12.5 MG tablet Take 1 tablet by mouth daily.  ? omeprazole (PRILOSEC) 20 MG capsule TAKE 1 CAPSULE BY MOUTH 30 MINUTES PRIOR TO BREAKFAST.  ? polyethylene glycol (MIRALAX) 17 g packet Take 17 g by mouth daily. (Patient taking differently: Take 17 g by mouth daily as needed for mild constipation.)  ? ?No facility-administered encounter medications on file as of 04/03/2021.  ? ? ?Allergies (verified) ?Bee venom, Codeine, and Lime oil  ? ?History: ?Past Medical History:  ?Diagnosis Date  ? Bladder [place] ([place]  ? Depression   ? Essential hypertension 01/16/2018  ? Nicotine addiction   ? [place] cancer ([place] 07/06/2014  ? Seasonal allergies   ? ?Past Surgical History:  ?Procedure Laterality Date  ? athroscopy of right knee  2002  ? cancerous polyps removed from bladder  2000  ? CATARACT EXTRACTION W/PHACO Left 03/01/2020  ? Procedure: CATARACT EXTRACTION PHACO AND INTRAOCULAR LENS PLACEMENT (IOC);  Surgeon: WBaruch Goldmann MD;  Location: [facility];  Service: Ophthalmology;  Laterality: Left;  CDE  4.96  ? CATARACT EXTRACTION W/PHACO Right 03/22/2020  ? Procedure: CATARACT EXTRACTION PHACO AND INTRAOCULAR LENS PLACEMENT RIGHT EYE;  Surgeon: WMarisa Hua  Jeneen Rinks, MD;  Location: [facility];  Service: Ophthalmology;  Laterality: Right;  CDE 5.78  ? COLONOSCOPY  MJ 2009 pmhX: POLYPS  ? POLYP REMOVED BUT NOT RETRIEVED  ? COLONOSCOPY  LS 2007 ARS  ? ? POLYPS, no path available  ? COLONOSCOPY  09/26/2010  ? sessile poylp(2) pan-colonic diverticulosis,mild/internal hemorrhoids  ? COLONOSCOPY WITH PROPOFOL N/A 02/09/2017  ? Procedure: COLONOSCOPY WITH PROPOFOL;  Surgeon: Danie Binder, MD;  [facility];  Service: Endoscopy;  Laterality: N/A;  8:30am  ? COLONOSCOPY WITH  PROPOFOL N/A 04/26/2020  ? Procedure: COLONOSCOPY WITH PROPOFOL;  Surgeon: Eloise Harman, DO;  Location: [facility];  Service: Endoscopy;  Laterality: N/A;  AM  ? ESOPHAGOGASTRODUODENOSCOPY (EGD) WITH PROPOFOL N/A 02/09/2017  ? Procedure: ESOPHAGOGASTRODUODENOSCOPY (EGD) WITH PROPOFOL;  Surgeon: Danie Binder, MD;  [facility]: [facility];  Service: Endoscopy;  Laterality: N/A;  ? FOOT SURGERY Bilateral 2004  ? HERNIA REPAIR  1980's  ? left inguinal hernia  ? INGUINAL HERNIA REPAIR Right 02/21/2021  ? Procedure: HERNIA REPAIR INGUINAL ADULT w/ MESH;  Surgeon: Virl Cagey, MD;  Location: [facility];  Service: General;  Laterality: Right;  ? LYMPHADENECTOMY Bilateral 09/03/2014  ? Procedure: PELVIC LYMPHADENECTOMY;  Surgeon: Raynelle Bring, MD;  Location: [facility];  Service: Urology;  Laterality: Bilateral;  ? POLYPECTOMY  02/09/2017  ? Procedure: POLYPECTOMY;  Surgeon: Danie Binder, MD;  [facility];  Service: Endoscopy;;  cecal polyp, hepatic flexure polyps x3, transverse colon polyp, descending colon polyps x2, rectal polyps x2  ? POLYPECTOMY  04/26/2020  ? Procedure: POLYPECTOMY;  Surgeon: Eloise Harman, DO;  [facility]: [facility];  Service: Endoscopy;;  ? PROSTATE BIOPSY  07/06/14  ? ROBOT ASSISTED LAPAROSCOPIC RADICAL PROSTATECTOMY N/A 09/03/2014  ? Procedure: ROBOTIC ASSISTED LAPAROSCOPIC RADICAL PROSTATECTOMY LEVEL 2;  Surgeon: Raynelle Bring, MD;  Location: [facility];  Service: Urology;  Laterality: N/A;  ? ?Family History  ?Problem Relation Age of Onset  ? Heart attack Father   ? Diabetes Sister   ? Hypertension Sister   ? Hypertension Sister   ? Aneurysm Sister   ?     brain   ? Arthritis Other   ?     family history   ? Colon cancer Neg Hx   ? Colon polyps Neg Hx   ? ?Social History  ? ?Socioeconomic History  ? Marital status: Married  ?  Spouse name: Not on file  ? Number of children: 2  ? Years of education: Not on file  ? Highest education level: Not on file  ?Occupational History  ?  Occupation: unemployed   ?Tobacco Use  ? Smoking status: Every Day  ?  Packs/day: 0.25  ?  Years: 30.00  ?  Pack years: 7.50  ?  Types: Cigarettes  ? Smokeless tobacco: Never  ?Vaping Use  ? Vaping Use: Never used  ?Substance and Sexual Activity  ? Alcohol use: Yes  ?  Comment: 3 pints/month  ? Drug use: Yes  ?  Types: Marijuana  ?  Comment: occassionally  ? Sexual activity: Yes  ?  Birth control/protection: None  ?Other Topics Concern  ? Not on file  ?Social History Narrative  ? Not on file  ? ?Social Determinants of Health  ? ?Financial Resource Strain: Not on file  ?Food Insecurity: Not on file  ?Transportation Needs: Not on file  ?Physical Activity: Not on file  ?Stress: Not on file  ?Social Connections: Not on  file  ? ? ?Tobacco Counseling ?Ready to quit: Not Answered ?Counseling given: Not Answered ? ? ?Clinical Intake: ? ?  ? ?Pain : No/denies pain ? ?  ? ?Diabetes: No ? ?How often do you need to have someone help you when you read instructions, pamphlets, or other written materials from your doctor or pharmacy?: 1 - Never ? ?Diabetic?no ? ?  ? ?  ? ? ?Activities of Daily Living ? ?  04/03/2021  ?  9:09 AM 02/20/2021  ?  1:10 PM  ?In your present state of health, do you have any difficulty performing the following activities:  ?Hearing? 0   ?Vision? 1   ?Difficulty concentrating or making decisions? 0   ?Walking or climbing stairs? 0   ?Dressing or bathing? 0   ?Doing errands, shopping?  0  ?Preparing Food and eating ? N   ?Using the Toilet? N   ?In the past six months, have you accidently leaked urine? Y   ?Do you have problems with loss of bowel control? N   ?Managing your Medications? N   ?Managing your Finances? N   ?Housekeeping or managing your Housekeeping? N   ? ? ?Patient Care Team: ?Fayrene Helper, MD as PCP - General ?Harl Bowie Alphonse Guild, MD as PCP - Cardiology (Cardiology) ?Fields, Marga Melnick, MD (Inactive) (Gastroenterology) ? ?Indicate any recent Medical Services you may have received from other  than [facility] providers in the past year (date may be approximate). ? ?   ?Assessment:  ? This is a routine wellness examination for Mussa. ? ?Hearing/Vision screen ?No results found. ? ?Dietary issues and exerci

## 2021-05-01 ENCOUNTER — Other Ambulatory Visit: Payer: Self-pay | Admitting: Internal Medicine

## 2021-05-07 ENCOUNTER — Other Ambulatory Visit: Payer: Self-pay

## 2021-05-07 ENCOUNTER — Telehealth: Payer: Self-pay | Admitting: Orthopedic Surgery

## 2021-05-07 NOTE — Telephone Encounter (Signed)
Patient called for refill of HYDROcodone-acetaminophen (NORCO/VICODIN) 5-325 MG tablet  ?Pharmacy: [facility] ?

## 2021-05-07 NOTE — Telephone Encounter (Signed)
The medication is no longer on the patient's chart. Looks like he had surgery and Dr.Bridges discontinued it and prescribed something. Patient requesting refill on hydrocodone.  ?

## 2021-05-08 ENCOUNTER — Other Ambulatory Visit: Payer: Self-pay | Admitting: Orthopedic Surgery

## 2021-05-08 MED ORDER — HYDROCODONE-ACETAMINOPHEN 5-325 MG PO TABS: 1.0000 | ORAL_TABLET | Freq: Four times a day (QID) | ORAL | 0 refills | Status: DC | PRN

## 2021-05-08 NOTE — Progress Notes (Signed)
?  Meds ordered this encounter  ?Medications  ? HYDROcodone-acetaminophen (NORCO/VICODIN) 5-325 MG tablet  ?  Sig: Take 1 tablet by mouth every 6 (six) hours as needed for moderate pain.  ?  Dispense:  30 tablet  ?  Refill:  0  ? ? ?

## 2021-06-03 ENCOUNTER — Other Ambulatory Visit: Payer: Self-pay | Admitting: Family Medicine

## 2021-06-11 ENCOUNTER — Encounter: Payer: Self-pay | Admitting: Family Medicine

## 2021-06-11 ENCOUNTER — Ambulatory Visit (INDEPENDENT_AMBULATORY_CARE_PROVIDER_SITE_OTHER): Payer: Medicare PPO | Admitting: Family Medicine

## 2021-06-11 VITALS — BP 122/74 | HR 62 | Resp 15 | Ht 75.0 in | Wt 195.8 lb

## 2021-06-11 DIAGNOSIS — D126 Benign neoplasm of colon, unspecified: Secondary | ICD-10-CM | POA: Diagnosis not present

## 2021-06-11 DIAGNOSIS — E785 Hyperlipidemia, unspecified: Secondary | ICD-10-CM | POA: Diagnosis not present

## 2021-06-11 DIAGNOSIS — E559 Vitamin D deficiency, unspecified: Secondary | ICD-10-CM

## 2021-06-11 DIAGNOSIS — I10 Essential (primary) hypertension: Secondary | ICD-10-CM | POA: Diagnosis not present

## 2021-06-11 DIAGNOSIS — F17200 Nicotine dependence, unspecified, uncomplicated: Secondary | ICD-10-CM

## 2021-06-11 DIAGNOSIS — F419 Anxiety disorder, unspecified: Secondary | ICD-10-CM | POA: Diagnosis not present

## 2021-06-11 DIAGNOSIS — N3289 Other specified disorders of bladder: Secondary | ICD-10-CM | POA: Diagnosis not present

## 2021-06-11 DIAGNOSIS — F17210 Nicotine dependence, cigarettes, uncomplicated: Secondary | ICD-10-CM

## 2021-06-11 DIAGNOSIS — F320 Major depressive disorder, single episode, mild: Secondary | ICD-10-CM | POA: Diagnosis not present

## 2021-06-11 DIAGNOSIS — R7303 Prediabetes: Secondary | ICD-10-CM | POA: Diagnosis not present

## 2021-06-11 NOTE — Patient Instructions (Addendum)
Annual exam 9/23 or after call if you need me sooner  Labs today lipid, chem 7 and EGFr, TSH, HBa1C, vit D  Need shingrix and TdAP at pharmacy  You are  being referred again to vascular and new is local urologist, also for chestr scan  Thanks for choosing [facility], we consider it a privelige to serve you.   Please work on quitting smoking

## 2021-06-12 DIAGNOSIS — H01002 Unspecified blepharitis right lower eyelid: Secondary | ICD-10-CM | POA: Diagnosis not present

## 2021-06-12 DIAGNOSIS — H01004 Unspecified blepharitis left upper eyelid: Secondary | ICD-10-CM | POA: Diagnosis not present

## 2021-06-12 DIAGNOSIS — H01001 Unspecified blepharitis right upper eyelid: Secondary | ICD-10-CM | POA: Diagnosis not present

## 2021-06-12 DIAGNOSIS — H34832 Tributary (branch) retinal vein occlusion, left eye, with macular edema: Secondary | ICD-10-CM | POA: Diagnosis not present

## 2021-06-12 LAB — LIPID PANEL
Chol/HDL Ratio: 2.7 ratio (ref 0.0–5.0)
Cholesterol, Total: 167 mg/dL (ref 100–199)
HDL: 63 mg/dL (ref >39)
LDL Chol Calc (NIH): 93 mg/dL (ref 0–99)
Triglycerides: 55 mg/dL (ref 0–149)
VLDL Cholesterol Cal: 11 mg/dL (ref 5–40)

## 2021-06-12 LAB — BMP8+EGFR
BUN/Creatinine Ratio: 14 (ref 10–24)
BUN: 14 mg/dL (ref 8–27)
CO2: 24 mmol/L (ref 20–29)
Calcium: 9.2 mg/dL (ref 8.6–10.2)
Chloride: 99 mmol/L (ref 96–106)
Creatinine, Ser: 1.01 mg/dL (ref 0.76–1.27)
Glucose: 87 mg/dL (ref 70–99)
Potassium: 3.7 mmol/L (ref 3.5–5.2)
Sodium: 139 mmol/L (ref 134–144)
eGFR: 79 mL/min/{1.73_m2} (ref >59)

## 2021-06-12 LAB — HEMOGLOBIN A1C
Est. average glucose Bld gHb Est-mCnc: 126 mg/dL
Hgb A1c MFr Bld: 6 % — ABNORMAL HIGH (ref 4.8–5.6)

## 2021-06-12 LAB — VITAMIN D 25 HYDROXY (VIT D DEFICIENCY, FRACTURES): Vit D, 25-Hydroxy: 40.8 ng/mL (ref 30.0–100.0)

## 2021-06-12 LAB — TSH: TSH: 0.829 u[IU]/mL (ref 0.450–4.500)

## 2021-06-18 ENCOUNTER — Encounter: Payer: Self-pay | Admitting: Family Medicine

## 2021-06-18 NOTE — Assessment & Plan Note (Signed)
Patient educated about the importance of limiting  Carbohydrate intake , the need to commit to daily physical activity for a minimum of 30 minutes , and to commit weight loss. The fact that changes in all these areas will reduce or eliminate all together the development of diabetes is stressed.      Latest Ref Rng & Units 06/11/2021    3:38 PM 02/13/2021    5:03 PM 02/05/2021    2:41 PM 10/03/2020    4:39 PM 03/07/2020    8:56 AM  Diabetic Labs  HbA1c 4.8 - 5.6 % 6.0    6.1  6.1   Chol 100 - 199 mg/dL 167     177   HDL >39 mg/dL 63     56   Calc LDL 0 - 99 mg/dL 93     110   Triglycerides 0 - 149 mg/dL 55     54   Creatinine 0.76 - 1.27 mg/dL 1.01  0.96  1.30  0.99        06/11/2021    2:50 PM 03/19/2021    2:44 PM 02/26/2021    3:10 PM 02/21/2021    1:55 PM 02/21/2021    1:30 PM 02/21/2021    1:15 PM 02/21/2021    1:00 PM  BP/Weight  Systolic BP 786 767 209 470 [phone]  Diastolic BP 74 75 72 68 73 72 79  Wt. (Lbs) 195.8 199 195.5      BMI 24.47 kg/m2 24.87 kg/m2 24.44 kg/m2           No data to display

## 2021-06-18 NOTE — Assessment & Plan Note (Signed)
Controlled, no change in medication DASH diet and commitment to daily physical activity for a minimum of 30 minutes discussed and encouraged, as a part of hypertension management. The importance of attaining a healthy weight is also discussed.     06/11/2021    2:50 PM 03/19/2021    2:44 PM 02/26/2021    3:10 PM 02/21/2021    1:55 PM 02/21/2021    1:30 PM 02/21/2021    1:15 PM 02/21/2021    1:00 PM  BP/Weight  Systolic BP 244 010 272 536 [phone]  Diastolic BP 74 75 72 68 73 72 79  Wt. (Lbs) 195.8 199 195.5      BMI 24.47 kg/m2 24.87 kg/m2 24.44 kg/m2

## 2021-06-18 NOTE — Assessment & Plan Note (Signed)
Controlled, no change in medication  

## 2021-06-18 NOTE — Assessment & Plan Note (Signed)
Asked:confirms currently smokes cigarettes °Assess: Unwilling to set a quit date, but is cutting back °Advise: needs to QUIT to reduce risk of cancer, cardio and cerebrovascular disease °Assist: counseled for 5 minutes and literature provided °Arrange: follow up in 2 to 4 months ° °

## 2021-06-18 NOTE — Progress Notes (Signed)
Matthew Adkins     MRN: [id number]      DOB: 12-04-1950   HPI Matthew Adkins is here for follow up and re-evaluation of chronic medical conditions, medication management and review of any available recent lab and radiology data.  Preventive health is updated, specifically  Cancer screening and Immunization.   Questions or concerns regarding consultations or procedures which the PT has had in the interim are  addressed. The PT denies any adverse reactions to current medications since the last visit.  There are no new concerns.  There are no specific complaints , ongoing stress being sole/ primary caregiver for his wife with dementia  ROS Denies recent fever or chills. Denies sinus pressure, nasal congestion, ear pain or sore throat. Denies chest congestion, productive cough or wheezing. Denies chest pains, palpitations and leg swelling Denies abdominal pain, nausea, vomiting,diarrhea or constipation.   Denies dysuria, frequency, hesitancy or incontinence Chronic back pain, managed by orthopedics. Denies headaches, seizures, numbness, or tingling. Denies uncontrolled depression, anxiety or insomnia. Denies skin break down or rash.   PE  BP 122/74   Pulse 62   Resp 15   Ht '6\' 3"'$  (1.905 m)   Wt 195 lb 12.8 oz (88.8 kg)   SpO2 98%   BMI 24.47 kg/m   Patient alert and oriented and in no cardiopulmonary distress.  HEENT: No facial asymmetry, EOMI,     Neck supple .  Chest: Clear to auscultation bilaterally.  CVS: S1, S2 no murmurs, no S3.Regular rate.  ABD: Soft non tender.   Ext: No edema  MS: Adequate ROM spine, shoulders, hips and knees.  Skin: Intact, no ulcerations or rash noted.  Psych: Good eye contact, normal affect. Memory intact not anxious or depressed appearing.  CNS: CN 2-12 intact, power,  normal throughout.no focal deficits noted.   Assessment & Plan  Essential hypertension Controlled, no change in medication DASH diet and commitment to daily  physical activity for a minimum of 30 minutes discussed and encouraged, as a part of hypertension management. The importance of attaining a healthy weight is also discussed.     06/11/2021    2:50 PM 03/19/2021    2:44 PM 02/26/2021    3:10 PM 02/21/2021    1:55 PM 02/21/2021    1:30 PM 02/21/2021    1:15 PM 02/21/2021    1:00 PM  BP/Weight  Systolic BP 144 818 563 149 702 637 858  Diastolic BP 74 75 72 68 73 72 79  Wt. (Lbs) 195.8 199 195.5      BMI 24.47 kg/m2 24.87 kg/m2 24.44 kg/m2           NICOTINE ADDICTION Asked:confirms currently smokes cigarettes Assess: Unwilling to set a quit date, but is cutting back Advise: needs to QUIT to reduce risk of cancer, cardio and cerebrovascular disease Assist: counseled for 5 minutes and literature provided Arrange: follow up in 2 to 4 months   Prediabetes Patient educated about the importance of limiting  Carbohydrate intake , the need to commit to daily physical activity for a minimum of 30 minutes , and to commit weight loss. The fact that changes in all these areas will reduce or eliminate all together the development of diabetes is stressed.      Latest Ref Rng & Units 06/11/2021    3:38 PM 02/13/2021    5:03 PM 02/05/2021    2:41 PM 10/03/2020    4:39 PM 03/07/2020    8:56 AM  Diabetic Labs  HbA1c 4.8 - 5.6 % 6.0    6.1  6.1   Chol 100 - 199 mg/dL 167     177   HDL >39 mg/dL 63     56   Calc LDL 0 - 99 mg/dL 93     110   Triglycerides 0 - 149 mg/dL 55     54   Creatinine 0.76 - 1.27 mg/dL 1.01  0.96  1.30  0.99        06/11/2021    2:50 PM 03/19/2021    2:44 PM 02/26/2021    3:10 PM 02/21/2021    1:55 PM 02/21/2021    1:30 PM 02/21/2021    1:15 PM 02/21/2021    1:00 PM  BP/Weight  Systolic BP 482 707 867 544 920 100 712  Diastolic BP 74 75 72 68 73 72 79  Wt. 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

## 2021-06-27 ENCOUNTER — Encounter: Payer: Self-pay | Admitting: *Deleted

## 2021-07-07 ENCOUNTER — Other Ambulatory Visit: Payer: Self-pay | Admitting: Family Medicine

## 2021-07-15 ENCOUNTER — Other Ambulatory Visit: Payer: Self-pay | Admitting: Orthopedic Surgery

## 2021-07-15 NOTE — Telephone Encounter (Signed)
Called back to patient in response to voice message for refill of: HYDROcodone-acetaminophen (NORCO/VICODIN) 5-325 MG tablet 30 tablet  I called back to patient - left message on voice mail to return call to verify all information

## 2021-07-16 MED ORDER — HYDROCODONE-ACETAMINOPHEN 5-325 MG PO TABS: 1.0000 | ORAL_TABLET | Freq: Four times a day (QID) | ORAL | 0 refills | Status: DC | PRN

## 2021-07-21 ENCOUNTER — Telehealth: Payer: Self-pay | Admitting: Family Medicine

## 2021-07-21 NOTE — Telephone Encounter (Signed)
Pt called wanting to know if he can please get a prednisone dose pack. States when he wakes up he has a lot of fleem that is gagging him when he tries to get it up.    [employer]

## 2021-07-23 ENCOUNTER — Ambulatory Visit (INDEPENDENT_AMBULATORY_CARE_PROVIDER_SITE_OTHER): Payer: Medicare PPO | Admitting: Family Medicine

## 2021-07-23 ENCOUNTER — Encounter: Payer: Self-pay | Admitting: Family Medicine

## 2021-07-23 DIAGNOSIS — J309 Allergic rhinitis, unspecified: Secondary | ICD-10-CM

## 2021-07-23 MED ORDER — MONTELUKAST SODIUM 10 MG PO TABS: 10.0000 mg | ORAL_TABLET | Freq: Every day | ORAL | 3 refills | Status: DC

## 2021-07-23 MED ORDER — PREDNISONE 5 MG PO TABS: 5.0000 mg | ORAL_TABLET | Freq: Two times a day (BID) | ORAL | 0 refills | Status: AC

## 2021-07-23 NOTE — Progress Notes (Signed)
Virtual Visit via Video Note  I connected with Matthew Adkins on 07/23/21 at 11:40 AM EDT by a video enabled telemedicine application and verified that I am speaking with the correct person using two identifiers.  Location: Patient: home Provider: office   I discussed the limitations of evaluation and management by telemedicine and the availability of in person appointments. The patient expressed understanding and agreed to proceed.  History of Present Illness:   3 week h/o nasal congestion, clear drainage, watery wyes, sneezing, no fever or chills, no sore throat, cough is non productive Observations/Objective: There were no vitals taken for this visit. Good communication with no confusion and intact memory. Alert and oriented x 3 No signs of respiratory distress during speech   Assessment and Plan:  Allergic rhinitis Uncontrolled, start daily singulair and 5 day course of prednisone  Follow Up Instructions:    I discussed the assessment and treatment plan with the patient. The patient was provided an opportunity to ask questions and all were answered. The patient agreed with the plan and demonstrated an understanding of the instructions.   The patient was advised to call back or seek an in-person evaluation if the symptoms worsen or if the condition fails to improve as anticipated.  I provided 7 minutes of non-face-to-face time during this encounter.   [name], MD

## 2021-07-23 NOTE — Assessment & Plan Note (Signed)
Uncontrolled, start daily singulair and 5 day course of prednisone

## 2021-07-23 NOTE — Patient Instructions (Signed)
Keep September appointment as before, call if you need me sooner  Medications are prescribed for uncontrolled allergy symptoms, montelukast one daily, and prednisone twice daily for 5 days only  Thanks for choosing [facility], we consider it a privelige to serve you.

## 2021-08-07 ENCOUNTER — Other Ambulatory Visit: Payer: Self-pay | Admitting: Family Medicine

## 2021-08-28 ENCOUNTER — Ambulatory Visit: Payer: Medicare PPO | Admitting: Orthopedic Surgery

## 2021-09-09 ENCOUNTER — Other Ambulatory Visit: Payer: Self-pay | Admitting: Family Medicine

## 2021-09-15 ENCOUNTER — Encounter: Payer: Self-pay | Admitting: Orthopedic Surgery

## 2021-09-15 ENCOUNTER — Ambulatory Visit: Payer: Medicare PPO

## 2021-09-15 ENCOUNTER — Ambulatory Visit (INDEPENDENT_AMBULATORY_CARE_PROVIDER_SITE_OTHER): Payer: Medicare PPO

## 2021-09-15 ENCOUNTER — Ambulatory Visit (INDEPENDENT_AMBULATORY_CARE_PROVIDER_SITE_OTHER): Payer: Medicare PPO | Admitting: Orthopedic Surgery

## 2021-09-15 DIAGNOSIS — M170 Bilateral primary osteoarthritis of knee: Secondary | ICD-10-CM

## 2021-09-15 DIAGNOSIS — M25551 Pain in right hip: Secondary | ICD-10-CM

## 2021-09-15 DIAGNOSIS — G8929 Other chronic pain: Secondary | ICD-10-CM

## 2021-09-15 MED ORDER — HYDROCODONE-ACETAMINOPHEN 5-325 MG PO TABS: 1.0000 | ORAL_TABLET | Freq: Four times a day (QID) | ORAL | 0 refills | Status: DC | PRN

## 2021-09-15 NOTE — Progress Notes (Signed)
Chief Complaint  Patient presents with   Knee Pain    Right/ and left    Hip Pain    Right /not bad today     Scheduled routine follow-up examination hip both knees  Patient is stable with current regimen  Takes hydrocodone every now and then  Past Medical History:  Diagnosis Date   Bladder cancer ([place]) 2000   Depression    Essential hypertension 01/16/2018   Nicotine addiction    Prostate cancer ([place]) 07/06/2014   Seasonal allergies    Allergies  Allergen Reactions   Bee Venom Nausea Only    Dizziness    Codeine Nausea And Vomiting   Lime Oil Rash     Gait is good range of motion still very good with no noticeable limp  His knees are in varus which is chronic  X-rays of both knees and his right hip were obtained he has 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    Dispense:  30 tablet    Refill:  0

## 2021-09-30 ENCOUNTER — Encounter: Payer: Medicare PPO | Admitting: Family Medicine

## 2021-10-04 NOTE — Progress Notes (Signed)
No chief complaint on file.

## 2021-10-06 ENCOUNTER — Other Ambulatory Visit: Payer: Self-pay | Admitting: Orthopedic Surgery

## 2021-10-06 DIAGNOSIS — M170 Bilateral primary osteoarthritis of knee: Secondary | ICD-10-CM

## 2021-10-06 DIAGNOSIS — G8929 Other chronic pain: Secondary | ICD-10-CM

## 2021-10-06 MED ORDER — HYDROCODONE-ACETAMINOPHEN 5-325 MG PO TABS: 1.0000 | ORAL_TABLET | Freq: Four times a day (QID) | ORAL | 0 refills | Status: DC | PRN

## 2021-10-06 NOTE — Telephone Encounter (Signed)
Patient called for refill:  HYDROcodone-acetaminophen (NORCO/VICODIN) 5-325 MG tablet 30 tablet       [employer]

## 2021-10-09 ENCOUNTER — Other Ambulatory Visit: Payer: Self-pay | Admitting: Family Medicine

## 2021-10-14 ENCOUNTER — Encounter: Payer: Self-pay | Admitting: Family Medicine

## 2021-10-14 ENCOUNTER — Ambulatory Visit (INDEPENDENT_AMBULATORY_CARE_PROVIDER_SITE_OTHER): Payer: Medicare PPO | Admitting: Family Medicine

## 2021-10-14 VITALS — BP 122/72 | HR 62 | Ht 75.0 in | Wt 193.0 lb

## 2021-10-14 DIAGNOSIS — Z23 Encounter for immunization: Secondary | ICD-10-CM | POA: Diagnosis not present

## 2021-10-14 DIAGNOSIS — N3289 Other specified disorders of bladder: Secondary | ICD-10-CM

## 2021-10-14 DIAGNOSIS — Z8546 Personal history of malignant neoplasm of prostate: Secondary | ICD-10-CM | POA: Diagnosis not present

## 2021-10-14 DIAGNOSIS — R7303 Prediabetes: Secondary | ICD-10-CM | POA: Diagnosis not present

## 2021-10-14 DIAGNOSIS — Z0000 Encounter for general adult medical examination without abnormal findings: Secondary | ICD-10-CM | POA: Insufficient documentation

## 2021-10-14 DIAGNOSIS — F17200 Nicotine dependence, unspecified, uncomplicated: Secondary | ICD-10-CM

## 2021-10-14 NOTE — Assessment & Plan Note (Signed)
H/o b;laddr and prostate cancer, abn scan reported in Feb 2023, Urology to evaluate , needs local urology

## 2021-10-14 NOTE — Assessment & Plan Note (Signed)

## 2021-10-14 NOTE — Progress Notes (Signed)
   Matthew Adkins     MRN: [id number]      DOB: 1950-11-30   HPI: Patient is in for annual physical exam. No other health concerns are expressed or addressed at the visit. Recent labs, if available are reviewed. Immunization is reviewed , and  updated if needed.    PE; BP 122/72 (BP Location: Right Arm, Patient Position: Sitting)   Pulse 62   Ht '6\' 3"'$  (1.905 m)   Wt 193 lb (87.5 kg)   SpO2 94%   BMI 24.12 kg/m   Pleasant male, alert and oriented x 3, in no cardio-pulmonary distress. Afebrile. HEENT No facial trauma or asymetry. Sinuses non tender. EOMI External ears normal,  Neck: supple, no adenopathy,JVD or thyromegaly.No bruits.  Chest: Clear to ascultation bilaterally.No crackles or wheezes. Non tender to palpation  Cardiovascular system; Heart sounds normal,  S1 and  S2 ,no S3.  No murmur, or thrill. Apical beat not displaced Peripheral pulses normal.  Abdomen: Soft, non tender, no organomegaly or masses. No bruits. Bowel sounds normal. No guarding, tenderness or rebound.    Musculoskeletal exam: Full ROM of spine, hips , shoulders and knees. No deformity ,swelling or crepitus noted. No muscle wasting or atrophy.   Neurologic: Cranial nerves 2 to 12 intact. Power, tone ,sensation and reflexes normal throughout. No disturbance in gait. No tremor.  Skin: Intact, no ulceration, erythema , scaling or rash noted. Pigmentation normal throughout  Psych; Normal mood and affect. Judgement and concentration normal   Assessment & Plan:  Bladder wall thickening H/o b;laddr and prostate cancer, abn scan reported in Feb 2023, Urology to evaluate , needs local urology  NICOTINE ADDICTION Asked:confirms currently smokes cigarettes Assess: Unwilling to set a quit date, but is cutting back Advise: needs to QUIT to reduce risk of cancer, cardio and cerebrovascular disease Assist: counseled for 5 minutes and literature provided Arrange: follow up in 2 to 4  months   Encounter for annual physical exam Annual exam as documented. Counseling done  re healthy lifestyle involving commitment to 150 minutes exercise per week, heart healthy diet, and attaining healthy weight.The importance of adequate sleep also discussed. Regular seat belt use and home safety, is also discussed. Changes in health habits are decided on by the patient with goals and time frames  set for achieving them. Immunization and cancer screening needs are specifically addressed at this visit.

## 2021-10-14 NOTE — Assessment & Plan Note (Signed)
Asked:confirms currently smokes cigarettes °Assess: Unwilling to set a quit date, but is cutting back °Advise: needs to QUIT to reduce risk of cancer, cardio and cerebrovascular disease °Assist: counseled for 5 minutes and literature provided °Arrange: follow up in 2 to 4 months ° °

## 2021-10-14 NOTE — Patient Instructions (Addendum)
F/u in [place], call if you need me sooner  Flu vaccine today  Work on quitting smoking for your health   Need covid and shingrix vaccines at your pharmacy, get them over the next 1 to 3 weeks please  Need lung cancer screening chest scan, nurse please arrange  PSA, chem 7 and EGFR  and hBA1C today  You are referred to Urology in [place] evaluate bladder wall thickening seen on scan earlier this year  Thanks for choosing [facility], we consider it a privelige to serve you.

## 2021-10-15 LAB — BMP8+EGFR
BUN/Creatinine Ratio: 14 (ref 10–24)
BUN: 14 mg/dL (ref 8–27)
CO2: 23 mmol/L (ref 20–29)
Calcium: 9 mg/dL (ref 8.6–10.2)
Chloride: 104 mmol/L (ref 96–106)
Creatinine, Ser: 1 mg/dL (ref 0.76–1.27)
Glucose: 84 mg/dL (ref 70–99)
Potassium: 3.9 mmol/L (ref 3.5–5.2)
Sodium: 141 mmol/L (ref 134–144)
eGFR: 79 mL/min/{1.73_m2} (ref >59)

## 2021-10-15 LAB — HEMOGLOBIN A1C
Est. average glucose Bld gHb Est-mCnc: 128 mg/dL
Hgb A1c MFr Bld: 6.1 % — ABNORMAL HIGH (ref 4.8–5.6)

## 2021-10-15 LAB — PSA: Prostate Specific Ag, Serum: <0.1 ng/mL (ref 0.0–4.0)

## 2021-10-16 ENCOUNTER — Other Ambulatory Visit: Payer: Self-pay | Admitting: *Deleted

## 2021-10-16 DIAGNOSIS — F17210 Nicotine dependence, cigarettes, uncomplicated: Secondary | ICD-10-CM

## 2021-10-16 DIAGNOSIS — Z122 Encounter for screening for malignant neoplasm of respiratory organs: Secondary | ICD-10-CM

## 2021-10-16 DIAGNOSIS — Z87891 Personal history of nicotine dependence: Secondary | ICD-10-CM

## 2021-10-27 ENCOUNTER — Emergency Department (HOSPITAL_COMMUNITY): Payer: Medicare PPO

## 2021-10-27 ENCOUNTER — Other Ambulatory Visit: Payer: Self-pay

## 2021-10-27 ENCOUNTER — Encounter (HOSPITAL_COMMUNITY): Payer: Self-pay | Admitting: *Deleted

## 2021-10-27 ENCOUNTER — Emergency Department (HOSPITAL_COMMUNITY)
Admission: EM | Admit: 2021-10-27 | Discharge: 2021-10-28 | Disposition: A | Discharge status: Home / Self Care | Payer: Medicare PPO | Attending: Emergency Medicine | Admitting: Emergency Medicine

## 2021-10-27 DIAGNOSIS — I10 Essential (primary) hypertension: Secondary | ICD-10-CM | POA: Insufficient documentation

## 2021-10-27 DIAGNOSIS — Y92002 Bathroom of unspecified non-institutional (private) residence single-family (private) house as the place of occurrence of the external cause: Secondary | ICD-10-CM | POA: Diagnosis not present

## 2021-10-27 DIAGNOSIS — R109 Unspecified abdominal pain: Secondary | ICD-10-CM | POA: Diagnosis not present

## 2021-10-27 DIAGNOSIS — Z043 Encounter for examination and observation following other accident: Secondary | ICD-10-CM | POA: Diagnosis not present

## 2021-10-27 DIAGNOSIS — W1839XA Other fall on same level, initial encounter: Secondary | ICD-10-CM | POA: Diagnosis not present

## 2021-10-27 DIAGNOSIS — Z79899 Other long term (current) drug therapy: Secondary | ICD-10-CM | POA: Diagnosis not present

## 2021-10-27 DIAGNOSIS — Y9301 Activity, walking, marching and hiking: Secondary | ICD-10-CM | POA: Insufficient documentation

## 2021-10-27 DIAGNOSIS — W19XXXA Unspecified fall, initial encounter: Secondary | ICD-10-CM

## 2021-10-27 LAB — I-STAT CHEM 8, ED
BUN: 12 mg/dL (ref 8–23)
Calcium, Ion: 1.05 mmol/L — ABNORMAL LOW (ref 1.15–1.40)
Chloride: 100 mmol/L (ref 98–111)
Creatinine, Ser: 0.8 mg/dL (ref 0.61–1.24)
Glucose, Bld: 115 mg/dL — ABNORMAL HIGH (ref 70–99)
HCT: 43 % (ref 39.0–52.0)
Hemoglobin: 14.6 g/dL (ref 13.0–17.0)
Potassium: 3.1 mmol/L — ABNORMAL LOW (ref 3.5–5.1)
Sodium: 137 mmol/L (ref 135–145)
TCO2: 26 mmol/L (ref 22–32)

## 2021-10-27 LAB — URINALYSIS, ROUTINE W REFLEX MICROSCOPIC
Bilirubin Urine: NEGATIVE
Glucose, UA: NEGATIVE mg/dL
Hgb urine dipstick: NEGATIVE
Ketones, ur: 5 mg/dL — AB
Leukocytes,Ua: NEGATIVE
Nitrite: NEGATIVE
Protein, ur: NEGATIVE mg/dL
Specific Gravity, Urine: 1.013 (ref 1.005–1.030)
pH: 6 (ref 5.0–8.0)

## 2021-10-27 MED ORDER — KETOROLAC TROMETHAMINE 30 MG/ML IJ SOLN
15.0000 mg | Freq: Once | INTRAMUSCULAR | Status: AC
  Administered 2021-10-27: 15 mg via INTRAVENOUS
  Filled 2021-10-27: qty 1

## 2021-10-27 MED ORDER — OXYCODONE-ACETAMINOPHEN 5-325 MG PO TABS: ORAL_TABLET | ORAL | 0 refills | Status: DC

## 2021-10-27 MED ORDER — IOHEXOL 300 MG/ML  SOLN
100.0000 mL | Freq: Once | INTRAMUSCULAR | Status: AC | PRN
  Administered 2021-10-27: 100 mL via INTRAVENOUS

## 2021-10-27 NOTE — ED Triage Notes (Signed)
Pt states he fell last night while walking in the bathroom.  Denies hitting head.  C/o left lower back pain, pt thinks he hit the toilet. Denies taking any blood thinners. Pt states he drove self here.

## 2021-10-27 NOTE — ED Provider Notes (Signed)
[facility] EMERGENCY DEPARTMENT Provider Note   CSN: [id number] Arrival date & time: 10/27/21  1806     History  Chief Complaint  Patient presents with   Matthew Adkins is a 71 y.o. male.  Patient states he fell on his left side 2 days ago and has pain over there.  Patient has history of hypertension  The history is provided by the patient and medical records. No language interpreter was used.  Fall This is a new problem. The current episode started 2 days ago. The problem occurs constantly. The problem has not changed since onset.Pertinent negatives include no chest pain, no abdominal pain and no headaches. Nothing aggravates the symptoms. Nothing relieves the symptoms. He has tried nothing for the symptoms.       Home Medications Prior to Admission medications   Medication Sig Start Date End Date Taking? Authorizing Provider  oxyCODONE-acetaminophen (PERCOCET/ROXICET) 5-325 MG tablet Take 1 every 6 hours as needed for pain has not relieved by Tylenol or Motrin 10/27/21  Yes Milton Ferguson, MD  acetaminophen (TYLENOL) 500 MG tablet Take 1,000 mg by mouth every 6 (six) hours as needed for mild pain.    [provider]  amLODipine (NORVASC) 10 MG tablet Take 1 tablet (10 mg total) by mouth daily. 10/03/20   Fayrene Helper, MD  buPROPion (WELLBUTRIN SR) 150 MG 12 hr tablet Take 1 tablet (150 mg total) by mouth 2 (two) times daily. 01/07/21   Fayrene Helper, MD  Carboxymethylcellul-Glycerin (LUBRICATING EYE DROPS OP) Place 1 drop into both eyes daily as needed (dry eyes).    [provider]  FLUoxetine (PROZAC) 20 MG capsule Take 1 capsule (20 mg total) by mouth daily. 10/03/20   Fayrene Helper, MD  HYDROcodone-acetaminophen (NORCO/VICODIN) 5-325 MG tablet Take 1 tablet by mouth every 6 (six) hours as needed for moderate pain. 10/06/21   Carole Civil, MD  montelukast (SINGULAIR) 10 MG tablet Take 1 tablet (10 mg total) by mouth at  bedtime. 07/23/21   Fayrene Helper, MD  Multiple Vitamins-Minerals (ONE DAILY MENS 50+ MULTIVIT PO) Take 1 tablet by mouth daily.    [provider]  olmesartan-hydrochlorothiazide (BENICAR HCT) 20-12.5 MG tablet TAKE ONE TABLET BY MOUTH ONCE DAILY. 10/09/21   Fayrene Helper, MD  omeprazole (PRILOSEC) 20 MG capsule TAKE 1 CAPSULE BY MOUTH 30 MINUTES PRIOR TO BREAKFAST. 03/04/21   Annitta Needs, NP  polyethylene glycol (MIRALAX) 17 g packet Take 17 g by mouth daily. Patient taking differently: Take 17 g by mouth daily as needed for mild constipation. 02/13/21   Loni Beckwith, PA-C  simethicone (MYLICON) 80 MG chewable tablet TAKE (1) TABLET BY MOUTH ONCE DAILY. 05/01/21   Fayrene Helper, MD      Allergies    Bee venom, Codeine, and Lime oil    Review of Systems   Review of Systems  Constitutional:  Negative for appetite change and fatigue.  HENT:  Negative for congestion, ear discharge and sinus pressure.   Eyes:  Negative for discharge.  Respiratory:  Negative for cough.   Cardiovascular:  Negative for chest pain.  Gastrointestinal:  Negative for abdominal pain and diarrhea.  Genitourinary:  Negative for frequency and hematuria.       Flank pain  Musculoskeletal:  Negative for back pain.  Skin:  Negative for rash.  Neurological:  Negative for seizures and headaches.  Psychiatric/Behavioral:  Negative for hallucinations.     Physical  Exam Updated Vital Signs BP 122/78   Pulse 60   Temp 98.8 F (37.1 C) (Oral)   Resp 18   Ht '6\' 3"'$  (1.905 m)   Wt 90.7 kg   SpO2 98%   BMI 25.00 kg/m  Physical Exam Vitals and nursing note reviewed.  Constitutional:      Appearance: He is well-developed.  HENT:     Head: Normocephalic.     Nose: Nose normal.  Eyes:     General: No scleral icterus.    Conjunctiva/sclera: Conjunctivae normal.  Neck:     Thyroid: No thyromegaly.  Cardiovascular:     Rate and Rhythm: Normal rate and regular rhythm.     Heart  sounds: No murmur heard.    No friction rub. No gallop.  Pulmonary:     Breath sounds: No stridor. No wheezing or rales.  Chest:     Chest wall: No tenderness.  Abdominal:     General: There is no distension.     Tenderness: There is no abdominal tenderness. There is no rebound.  Genitourinary:    Comments: Tenderness left flank Musculoskeletal:        General: Normal range of motion.     Cervical back: Neck supple.  Lymphadenopathy:     Cervical: No cervical adenopathy.  Skin:    Findings: No erythema or rash.  Neurological:     Mental Status: He is alert and oriented to person, place, and time.     Motor: No abnormal muscle tone.     Coordination: Coordination normal.  Psychiatric:        Behavior: Behavior normal.     ED Results / Procedures / Treatments   Labs (all labs ordered are listed, but only abnormal results are displayed) Labs Reviewed  URINALYSIS, ROUTINE W REFLEX MICROSCOPIC - Abnormal; Notable for the following components:      Result Value   Ketones, ur 5 (*)    All other components within normal limits  I-STAT CHEM 8, ED - Abnormal; Notable for the following components:   Potassium 3.1 (*)    Glucose, Bld 115 (*)    Calcium, Ion 1.05 (*)    All other components within normal limits    EKG None  Radiology CT ABDOMEN PELVIS W CONTRAST  Result Date: 10/27/2021 CLINICAL DATA:  Recent fall with left flank pain, initial encounter EXAM: CT ABDOMEN AND PELVIS WITH CONTRAST TECHNIQUE: Multidetector CT imaging of the abdomen and pelvis was performed using the standard protocol following bolus administration of intravenous contrast. RADIATION DOSE REDUCTION: This exam was performed according to the departmental dose-optimization program which includes automated exposure control, adjustment of the mA and/or kV according to patient size and/or use of iterative reconstruction technique. CONTRAST:  165m OMNIPAQUE IOHEXOL 300 MG/ML  SOLN COMPARISON:  02/13/2021  FINDINGS: Lower chest: No acute abnormality. Mildly displaced fractures of the left eleventh and twelfth ribs are noted posteriorly. No pneumothorax is seen. Hepatobiliary: Multiple cysts are noted scattered throughout the liver stable in appearance from the prior exam. The gallbladder is partially decompressed. Pancreas: Unremarkable. No pancreatic ductal dilatation or surrounding inflammatory changes. Spleen: Normal in size without focal abnormality. Adrenals/Urinary Tract: Adrenal glands are within normal limits. Multiple renal cysts are seen bilaterally. These are stable in appearance from the prior exam and simple in nature. No further follow-up is recommended. Normal excretion is noted on delayed images. The bladder is decompressed. Stomach/Bowel: The appendix is within normal limits. No obstructive or inflammatory changes of the  colon are seen. Small bowel and stomach are stable in appearance. Vascular/Lymphatic: Aortic atherosclerosis. No enlarged abdominal or pelvic lymph nodes. 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 Mild stable dilatation of the infrarenal aorta to 3.3 cm is noted. 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 Reproductive: Prostate has been surgically removed. Other: No abdominal wall hernia or abnormality. No abdominopelvic ascites. Previously seen right inguinal hernia has been repaired. Musculoskeletal: Fractures of the left eleventh and twelfth ribs are noted posteriorly. Degenerative changes of the lumbar spine are seen. IMPRESSION: Fractures of the left eleventh and twelfth ribs posteriorly consistent with the given clinical history 3.3 cm infrarenal abdominal aortic aneurysm. Recommend follow-up every 3 years. Reference: J Am Coll Radiol 1740;81:448-185. The remainder of the exam is stable in appearance. Electronically Signed   By: Inez Catalina M.D.   On: 10/27/2021 23:15    Procedures Procedures     Medications Ordered in ED Medications  ketorolac (TORADOL) 30 MG/ML injection 15 mg (has no administration in time range)  iohexol (OMNIPAQUE) 300 MG/ML solution 100 mL (100 mLs Intravenous Contrast Given 10/27/21 2252)    ED Course/ Medical Decision Making/ A&P                           Medical Decision Making Amount and/or Complexity of Data Reviewed Labs: ordered. Radiology: ordered.  Risk Prescription drug management.  This patient presents to the ED for concern of fall, this involves an extensive number of treatment options, and is a complaint that carries with it a high risk of complications and morbidity.  The differential diagnosis includes flank pain, kidney injury or fractured rib   Co morbidities that complicate the patient evaluation  Hypertension   Additional history obtained:  Additional history obtained from patient External records from outside source obtained and reviewed including hospital record   Lab Tests:  I Ordered, and personally interpreted labs.  The pertinent results include: I-STAT 8 shows potassium 3.1, hemoglobin 14.6   Imaging Studies ordered:  I ordered imaging studies including CT abdomen I independently visualized and interpreted imaging which showed 2 broken ribs I agree with the radiologist interpretation   Cardiac Monitoring: / EKG:  The patient was maintained on a cardiac monitor.  I personally viewed and interpreted the cardiac monitored which showed an underlying rhythm of: Normal sinus rhythm   Consultations Obtained: No consultant  Problem List / ED Course / Critical interventions / Medication management  Hypertension fall I ordered medication including Toradol for pain Reevaluation of the patient after these medicines showed that the patient improved I have reviewed the patients home medicines and have made adjustments as needed   Social Determinants of Health:  None   Test / Admission - Considered:  No  additional test needed  Patient with 2 fractured ribs.  He is given Percocet and will follow-up with his PCP        Final Clinical Impression(s) / ED Diagnoses Final diagnoses:  Fall, initial encounter    Rx / DC Orders ED Discharge Orders          Ordered    oxyCODONE-acetaminophen (PERCOCET/ROXICET) 5-325 MG tablet        10/27/21 2341              Milton Ferguson, MD 11/02/21 1156

## 2021-10-27 NOTE — Discharge Instructions (Signed)
Follow-up with your family doctor next week if not improving °

## 2021-10-28 ENCOUNTER — Ambulatory Visit: Payer: Medicare PPO | Admitting: Urology

## 2021-10-28 NOTE — Progress Notes (Deleted)
Assessment: 1. History of bladder cancer   2. History of prostate cancer      Plan: ***  Chief Complaint: No chief complaint on file.   History of Present Illness:  Matthew Adkins is a 71 y.o. male who is seen in consultation from Fayrene Helper, MD for evaluation of ***.   Past Medical History:  Past Medical History:  Diagnosis Date   Bladder cancer ([place]) 2000   Depression    Essential hypertension 01/16/2018   Nicotine addiction    Prostate cancer ([place]) 07/06/2014   Seasonal allergies     Past Surgical History:  Past Surgical History:  Procedure Laterality Date   athroscopy of right knee  2002   cancerous polyps removed from bladder  2000   CATARACT EXTRACTION W/PHACO Left 03/01/2020   Procedure: CATARACT EXTRACTION PHACO AND INTRAOCULAR LENS PLACEMENT ([place]);  Surgeon: Baruch Goldmann, MD;  Location: [facility];  Service: Ophthalmology;  Laterality: Left;  CDE  4.96   CATARACT EXTRACTION W/PHACO Right 03/22/2020   Procedure: CATARACT EXTRACTION PHACO AND INTRAOCULAR LENS PLACEMENT RIGHT EYE;  Surgeon: Baruch Goldmann, MD;  Location: [facility];  Service: Ophthalmology;  Laterality: Right;  CDE 5.78   COLONOSCOPY  MJ 2009 pmhX: POLYPS   POLYP REMOVED BUT NOT RETRIEVED   COLONOSCOPY  LS 2007 ARS   ? POLYPS, no path available   COLONOSCOPY  09/26/2010   sessile poylp(2) pan-colonic diverticulosis,mild/internal hemorrhoids   COLONOSCOPY WITH PROPOFOL N/A 02/09/2017   Procedure: COLONOSCOPY WITH PROPOFOL;  Surgeon: Danie Binder, MD;  [facility];  Service: Endoscopy;  Laterality: N/A;  8:30am   COLONOSCOPY WITH PROPOFOL N/A 04/26/2020   Procedure: COLONOSCOPY WITH PROPOFOL;  Surgeon: Eloise Harman, DO;  [facility];  Service: Endoscopy;  Laterality: N/A;  AM   ESOPHAGOGASTRODUODENOSCOPY (EGD) WITH PROPOFOL N/A 02/09/2017   Procedure: ESOPHAGOGASTRODUODENOSCOPY (EGD) WITH PROPOFOL;  Surgeon: Danie Binder, MD;  [facility];  Service:  Endoscopy;  Laterality: N/A;   FOOT SURGERY Bilateral 2004   HERNIA REPAIR  1980's   left inguinal hernia   INGUINAL HERNIA REPAIR Right 02/21/2021   Procedure: HERNIA REPAIR INGUINAL ADULT w/ MESH;  Surgeon: Virl Cagey, MD;  Location: [facility];  Service: General;  Laterality: Right;   LYMPHADENECTOMY Bilateral 09/03/2014   Procedure: PELVIC LYMPHADENECTOMY;  Surgeon: Raynelle Bring, MD;  Location: [facility];  Service: Urology;  Laterality: Bilateral;   POLYPECTOMY  02/09/2017   Procedure: POLYPECTOMY;  Surgeon: Danie Binder, MD;  [facility];  Service: Endoscopy;;  cecal polyp, hepatic flexure polyps x3, transverse colon polyp, descending colon polyps x2, rectal polyps x2   POLYPECTOMY  04/26/2020   Procedure: POLYPECTOMY;  Surgeon: Eloise Harman, DO;  [facility];  Service: Endoscopy;;   PROSTATE BIOPSY  07/06/14   ROBOT ASSISTED LAPAROSCOPIC RADICAL PROSTATECTOMY N/A 09/03/2014   Procedure: ROBOTIC ASSISTED LAPAROSCOPIC RADICAL PROSTATECTOMY LEVEL 2;  Surgeon: Raynelle Bring, MD;  [facility];  Service: Urology;  Laterality: N/A;    Allergies:  Allergies  Allergen Reactions   Bee Venom Nausea Only    Dizziness    Codeine Nausea And Vomiting   Lime Oil Rash    Family History:  Family History  Problem Relation Age of Onset   Heart attack Father    Diabetes Sister    Hypertension Sister    Hypertension Sister    Aneurysm Sister        brain  Arthritis Other        family history    Colon cancer Neg Hx    Colon polyps Neg Hx     Social History:  Social History   Tobacco Use   Smoking status: Every Day    Packs/day: 0.25    Years: 30.00    Total pack years: 7.50    Types: Cigarettes   Smokeless tobacco: Never  Vaping Use   Vaping Use: Never used  Substance Use Topics   Alcohol use: Yes    Comment: 3 pints/month   Drug use: Yes    Types: Marijuana    Comment: occassionally    Review of symptoms:  Constitutional:  Negative for  unexplained weight loss, night sweats, fever, chills ENT:  Negative for nose bleeds, sinus pain, painful swallowing CV:  Negative for chest pain, shortness of breath, exercise intolerance, palpitations, loss of consciousness Resp:  Negative for cough, wheezing, shortness of breath GI:  Negative for nausea, vomiting, diarrhea, bloody stools GU:  Positives noted in HPI; otherwise negative for gross hematuria, dysuria, urinary incontinence Neuro:  Negative for seizures, poor balance, limb weakness, slurred speech Psych:  Negative for lack of energy, depression, anxiety Endocrine:  Negative for polydipsia, polyuria, symptoms of hypoglycemia (dizziness, hunger, sweating) Hematologic:  Negative for anemia, purpura, petechia, prolonged or excessive bleeding, use of anticoagulants  Allergic:  Negative for difficulty breathing or choking as a result of exposure to anything; no shellfish allergy; no allergic response (rash/itch) to materials, foods  Physical exam: There were no vitals taken for this visit. GENERAL APPEARANCE:  Well appearing, well developed, well nourished, NAD HEENT: Atraumatic, Normocephalic, oropharynx clear. NECK: Supple without lymphadenopathy or thyromegaly. LUNGS: Clear to auscultation bilaterally. HEART: Regular Rate and Rhythm without murmurs, gallops, or rubs. ABDOMEN: Soft, non-tender, No Masses. EXTREMITIES: Moves all extremities well.  Without clubbing, cyanosis, or edema. NEUROLOGIC:  Alert and oriented x 3, normal gait, CN II-XII grossly intact.  MENTAL STATUS:  Appropriate. BACK:  Non-tender to palpation.  No CVAT SKIN:  Warm, dry and intact.    Results: U/A:

## 2021-10-30 ENCOUNTER — Other Ambulatory Visit: Payer: Self-pay | Admitting: Family Medicine

## 2021-10-30 ENCOUNTER — Other Ambulatory Visit: Payer: Self-pay | Admitting: Orthopedic Surgery

## 2021-10-30 DIAGNOSIS — M170 Bilateral primary osteoarthritis of knee: Secondary | ICD-10-CM

## 2021-10-30 DIAGNOSIS — G8929 Other chronic pain: Secondary | ICD-10-CM

## 2021-11-01 ENCOUNTER — Other Ambulatory Visit: Payer: Self-pay | Admitting: Family Medicine

## 2021-11-03 ENCOUNTER — Telehealth: Payer: Self-pay | Admitting: Family Medicine

## 2021-11-03 NOTE — Telephone Encounter (Signed)
Patient called in requesting a call back .  Wants to know if provider can send something in for pain.  Pt appt 11/7

## 2021-11-03 NOTE — Telephone Encounter (Signed)
His med list is showing hydrocodone prescribed today by Aline Brochure

## 2021-11-06 ENCOUNTER — Telehealth: Payer: Self-pay

## 2021-11-06 ENCOUNTER — Ambulatory Visit (HOSPITAL_COMMUNITY)
Admission: RE | Admit: 2021-11-06 | Discharge: 2021-11-06 | Disposition: A | Discharge status: Home / Self Care | Payer: Medicare PPO | Source: Ambulatory Visit | Attending: Acute Care | Admitting: Acute Care

## 2021-11-06 DIAGNOSIS — Z122 Encounter for screening for malignant neoplasm of respiratory organs: Secondary | ICD-10-CM | POA: Diagnosis not present

## 2021-11-06 DIAGNOSIS — F17210 Nicotine dependence, cigarettes, uncomplicated: Secondary | ICD-10-CM | POA: Diagnosis not present

## 2021-11-06 DIAGNOSIS — Z87891 Personal history of nicotine dependence: Secondary | ICD-10-CM | POA: Insufficient documentation

## 2021-11-06 NOTE — Telephone Encounter (Signed)
        Patient  visited Dorita Fray  on 10/24     Telephone encounter attempt :1st    A HIPAA compliant voice message was left requesting a return call.  Instructed patient to call back    [place]  [phone] [address], [place], [place] [address] Phone: [phone] Email: [email or web address]

## 2021-11-07 ENCOUNTER — Telehealth: Payer: Self-pay

## 2021-11-07 NOTE — Telephone Encounter (Signed)
        Patient  visited Dorita Fray on 10/24     Telephone encounter attempt :  2nd  A HIPAA compliant voice message was left requesting a return call.  Instructed patient to call back    [place]  [phone] [address], [place], [place] [address] Phone: [phone] Email: [email or web address]

## 2021-11-10 ENCOUNTER — Other Ambulatory Visit: Payer: Self-pay | Admitting: Family Medicine

## 2021-11-11 ENCOUNTER — Other Ambulatory Visit: Payer: Self-pay

## 2021-11-11 ENCOUNTER — Encounter: Payer: Self-pay | Admitting: Family Medicine

## 2021-11-11 ENCOUNTER — Ambulatory Visit (INDEPENDENT_AMBULATORY_CARE_PROVIDER_SITE_OTHER): Payer: Medicare PPO | Admitting: Family Medicine

## 2021-11-11 VITALS — BP 116/70 | HR 68 | Ht 75.0 in | Wt 179.0 lb

## 2021-11-11 DIAGNOSIS — I7143 Infrarenal abdominal aortic aneurysm, without rupture: Secondary | ICD-10-CM

## 2021-11-11 DIAGNOSIS — I10 Essential (primary) hypertension: Secondary | ICD-10-CM

## 2021-11-11 DIAGNOSIS — Z87891 Personal history of nicotine dependence: Secondary | ICD-10-CM

## 2021-11-11 DIAGNOSIS — F17210 Nicotine dependence, cigarettes, uncomplicated: Secondary | ICD-10-CM | POA: Diagnosis not present

## 2021-11-11 DIAGNOSIS — F17200 Nicotine dependence, unspecified, uncomplicated: Secondary | ICD-10-CM

## 2021-11-11 DIAGNOSIS — S2242XD Multiple fractures of ribs, left side, subsequent encounter for fracture with routine healing: Secondary | ICD-10-CM | POA: Diagnosis not present

## 2021-11-11 DIAGNOSIS — S2242XA Multiple fractures of ribs, left side, initial encounter for closed fracture: Secondary | ICD-10-CM

## 2021-11-11 DIAGNOSIS — Z122 Encounter for screening for malignant neoplasm of respiratory organs: Secondary | ICD-10-CM

## 2021-11-11 MED ORDER — HYDROCODONE-ACETAMINOPHEN 5-325 MG PO TABS: 1.0000 | ORAL_TABLET | Freq: Four times a day (QID) | ORAL | 0 refills | Status: DC | PRN

## 2021-11-11 NOTE — Patient Instructions (Signed)
Follow-up as before, call if you need me sooner.  A limited supply of pain medication is prescribed as we discussed and I will refer you to Dr. Aline Brochure for a sooner appointment.  Be careful not to fall again.  Your recent screening chest scan was negative so continue to work on stopping smoking...  You are being referred to vascular team to follow-up and manage your aortic aneurysm. Thanks for choosing [facility], we consider it a privelige to serve you.

## 2021-11-12 ENCOUNTER — Encounter: Payer: Self-pay | Admitting: Family Medicine

## 2021-11-12 NOTE — Progress Notes (Signed)
   Matthew Adkins     MRN: [id number]      DOB: 1950-08-30   HPI Matthew Adkins is here for follow up of recent ED voisit when he presented with 2 fractured ribs  on 10/27/2021, when he fell in the bathroom accidentally Percocet was prescribed however he states he never took it as it made him feel badly.  He is on chronic pain medicine from orthopedics.  Still complains of pain with breathing however less severe. ROS Denies recent fever or chills. Denies sinus pressure, nasal congestion, ear pain or sore throat. Denies chest congestion, productive cough or wheezing. Denies  palpitations and leg swelling Denies abdominal pain, nausea, vomiting,diarrhea or constipation.   Denies dysuria, frequency, hesitancy or incontinence. . Denies headaches, seizures, numbness, or tingling. Denies depression, anxiety or insomnia. Denies skin break down or rash.   PE  BP 116/70 (BP Location: Right Arm, Patient Position: Sitting, Cuff Size: Normal)   Pulse 68   Ht '6\' 3"'$  (1.905 m)   Wt 179 lb (81.2 kg)   SpO2 95%   BMI 22.37 kg/m   Patient alert and oriented and in no cardiopulmonary distress.  HEENT: No facial asymmetry, EOMI,     Neck supple .  Chest: Clear to auscultation bilaterally.Tender over left lower ribcage anteromedial aspect  CVS: S1, S2 no murmurs, no S3.Regular rate.  ABD: Soft non tender.   Ext: No edema  MS: Adequate ROM spine, shoulders, hips and knees.  Skin: Intact, no ulcerations or rash noted.  Psych: Good eye contact, normal affect. Memory intact not anxious or depressed appearing.  CNS: CN 2-12 intact, power,  normal throughout.no focal deficits noted.   Assessment & Plan  Rib fractures Improving slowly, pain management an issue, reports not taking percocet prescribed, did not like it made him feel, refer to Ortho for ongoing management, already on chronic pain meds from ortho, 5 day script sent for hydrocodone   Aortic aneurysm (HCC) Needs f/u with  vascular  Essential hypertension Controlled, no change in medication   NICOTINE ADDICTION Asked:confirms currently smokes cigarettes Assess: Unwilling to set a quit date, but is cutting back Advise: needs to QUIT to reduce risk of cancer, cardio and cerebrovascular disease Assist: counseled for 5 minutes and literature provided Arrange: follow up in 2 to 4 months

## 2021-11-14 ENCOUNTER — Encounter: Payer: Self-pay | Admitting: Family Medicine

## 2021-11-14 DIAGNOSIS — S2249XA Multiple fractures of ribs, unspecified side, initial encounter for closed fracture: Secondary | ICD-10-CM | POA: Insufficient documentation

## 2021-11-14 NOTE — Assessment & Plan Note (Signed)
Controlled, no change in medication  

## 2021-11-14 NOTE — Assessment & Plan Note (Signed)
Needs f/u with vascular

## 2021-11-14 NOTE — Assessment & Plan Note (Signed)
Asked:confirms currently smokes cigarettes °Assess: Unwilling to set a quit date, but is cutting back °Advise: needs to QUIT to reduce risk of cancer, cardio and cerebrovascular disease °Assist: counseled for 5 minutes and literature provided °Arrange: follow up in 2 to 4 months ° °

## 2021-11-14 NOTE — Assessment & Plan Note (Signed)
Improving slowly, pain management an issue, reports not taking percocet prescribed, did not like it made him feel, refer to Ortho for ongoing management, already on chronic pain meds from ortho, 5 day script sent for hydrocodone

## 2021-11-18 ENCOUNTER — Ambulatory Visit: Payer: Medicare PPO | Admitting: Orthopaedic Surgery

## 2021-11-18 ENCOUNTER — Other Ambulatory Visit: Payer: Self-pay | Admitting: Family Medicine

## 2021-11-18 ENCOUNTER — Ambulatory Visit (INDEPENDENT_AMBULATORY_CARE_PROVIDER_SITE_OTHER): Payer: Medicare PPO | Admitting: Urology

## 2021-11-18 ENCOUNTER — Encounter: Payer: Self-pay | Admitting: Urology

## 2021-11-18 ENCOUNTER — Encounter: Payer: Self-pay | Admitting: Orthopaedic Surgery

## 2021-11-18 VITALS — BP 123/74 | HR 61 | Ht 75.0 in | Wt 188.4 lb

## 2021-11-18 VITALS — BP 117/77 | HR 75 | Ht 75.0 in | Wt 185.0 lb

## 2021-11-18 DIAGNOSIS — Z8551 Personal history of malignant neoplasm of bladder: Secondary | ICD-10-CM

## 2021-11-18 DIAGNOSIS — N39498 Other specified urinary incontinence: Secondary | ICD-10-CM

## 2021-11-18 DIAGNOSIS — Z8546 Personal history of malignant neoplasm of prostate: Secondary | ICD-10-CM

## 2021-11-18 DIAGNOSIS — S2242XA Multiple fractures of ribs, left side, initial encounter for closed fracture: Secondary | ICD-10-CM

## 2021-11-18 DIAGNOSIS — R32 Unspecified urinary incontinence: Secondary | ICD-10-CM | POA: Insufficient documentation

## 2021-11-18 MED ORDER — HYDROCODONE-ACETAMINOPHEN 7.5-325 MG PO TABS: 1.0000 | ORAL_TABLET | ORAL | 0 refills | Status: AC | PRN

## 2021-11-18 NOTE — Progress Notes (Signed)
Assessment: 1. History of prostate cancer; T3aN1Mx, Gleason 3+4, s/p RALP/BPLND 8/16   2. History of bladder cancer; low grade, diagnosed in 2000   3. Other urinary incontinence. post radical prostatectomy     Plan: I personally reviewed the patient's records from his PCP as well as prior records from Dr. Alinda Money. I personally reviewed the CT studies from February 2023 and October 2023.  The changes noted within the bladder were likely secondary to under distention.  I do not think that recurrent bladder cancer is likely at this time. Return to office in 1 year   Chief Complaint:  Chief Complaint  Patient presents with   Prostate Cancer   History of Present Illness:  Matthew Adkins is a 71 y.o. male who is seen in consultation from Fayrene Helper, MD for evaluation of history of prostate cancer and bladder cancer. He has a history of superficial bladder cancer diagnosed by Dr. [place] Pink in 2000.  No recurrences to date.  He has a history of prostate cancer and is status post robotic assisted laparoscopic radical prostatectomy and bilateral pelvic lymph node dissection by Dr. Alinda Money in August 2016.  Pathology showed pT3aN1Mx Gleason 3+4 equal 7 adenocarcinoma with negative margins and 1/13 lymph nodes positive.  His pretreatment PSA was 16.69.  Today has been undetectable postoperatively.  His most recent PSA from 10/23 was <0.1.  He was evaluated with a CT abdomen and pelvis with contrast in February 2023 for a inguinal hernia.  This study showed simple renal cysts bilaterally, no obstruction, decompressed urinary bladder with slight irregular bladder wall thickening.  Repeat CT imaging from 2/23 showed no bladder abnormality.  CT imaging from 10/27/2021 showed bilateral renal cyst, stable in appearance, and a decompressed bladder.  He has not had any gross hematuria or dysuria.  He has had urinary incontinence since his prostatectomy.  He uses 1 pad per day.  He has stress  incontinence and does report some urgency and occasional urge incontinence.  He voids with a good stream and feels like he empties his bladder completely.  No UTIs.   Past Medical History:  Past Medical History:  Diagnosis Date   Arthritis    Bladder cancer ([place]) 2000   Depression    Essential hypertension 01/16/2018   Nicotine addiction    Prostate cancer ([place]) 07/06/2014   Seasonal allergies     Past Surgical History:  Past Surgical History:  Procedure Laterality Date   athroscopy of right knee  2002   cancerous polyps removed from bladder  2000   CATARACT EXTRACTION W/PHACO Left 03/01/2020   Procedure: CATARACT EXTRACTION PHACO AND INTRAOCULAR LENS PLACEMENT ([place]);  Surgeon: Baruch Goldmann, MD;  Location: [facility];  Service: Ophthalmology;  Laterality: Left;  CDE  4.96   CATARACT EXTRACTION W/PHACO Right 03/22/2020   Procedure: CATARACT EXTRACTION PHACO AND INTRAOCULAR LENS PLACEMENT RIGHT EYE;  Surgeon: Baruch Goldmann, MD;  Location: [facility];  Service: Ophthalmology;  Laterality: Right;  CDE 5.78   COLONOSCOPY  MJ 2009 pmhX: POLYPS   POLYP REMOVED BUT NOT RETRIEVED   COLONOSCOPY  LS 2007 ARS   ? POLYPS, no path available   COLONOSCOPY  09/26/2010   sessile poylp(2) pan-colonic diverticulosis,mild/internal hemorrhoids   COLONOSCOPY WITH PROPOFOL N/A 02/09/2017   Procedure: COLONOSCOPY WITH PROPOFOL;  Surgeon: Danie Binder, MD;  [facility];  Service: Endoscopy;  Laterality: N/A;  8:30am   COLONOSCOPY WITH PROPOFOL N/A 04/26/2020   Procedure: COLONOSCOPY WITH PROPOFOL;  Surgeon:  Eloise Harman, DO;  [facility];  Service: Endoscopy;  Laterality: N/A;  AM   ESOPHAGOGASTRODUODENOSCOPY (EGD) WITH PROPOFOL N/A 02/09/2017   Procedure: ESOPHAGOGASTRODUODENOSCOPY (EGD) WITH PROPOFOL;  Surgeon: Danie Binder, MD;  [facility];  Service: Endoscopy;  Laterality: N/A;   FOOT SURGERY Bilateral 2004   HERNIA REPAIR  1980's   left inguinal hernia   INGUINAL  HERNIA REPAIR Right 02/21/2021   Procedure: HERNIA REPAIR INGUINAL ADULT w/ MESH;  Surgeon: Virl Cagey, MD;  Location: [facility];  Service: General;  Laterality: Right;   LYMPHADENECTOMY Bilateral 09/03/2014   Procedure: PELVIC LYMPHADENECTOMY;  Surgeon: Raynelle Bring, MD;  [facility];  Service: Urology;  Laterality: Bilateral;   POLYPECTOMY  02/09/2017   Procedure: POLYPECTOMY;  Surgeon: Danie Binder, MD;  [facility];  Service: Endoscopy;;  cecal polyp, hepatic flexure polyps x3, transverse colon polyp, descending colon polyps x2, rectal polyps x2   POLYPECTOMY  04/26/2020   Procedure: POLYPECTOMY;  Surgeon: Eloise Harman, DO;  [facility];  Service: Endoscopy;;   PROSTATE BIOPSY  07/06/14   ROBOT ASSISTED LAPAROSCOPIC RADICAL PROSTATECTOMY N/A 09/03/2014   Procedure: ROBOTIC ASSISTED LAPAROSCOPIC RADICAL PROSTATECTOMY LEVEL 2;  Surgeon: Raynelle Bring, MD;  [facility];  Service: Urology;  Laterality: N/A;    Allergies:  Allergies  Allergen Reactions   Bee Venom Nausea Only    Dizziness    Codeine Nausea And Vomiting   Lime Oil Rash    Family History:  Family History  Problem Relation Age of Onset   Heart attack Father    Diabetes Sister    Hypertension Sister    Hypertension Sister    Aneurysm Sister        brain    Arthritis Other        family history    Colon cancer Neg Hx    Colon polyps Neg Hx     Social History:  Social History   Tobacco Use   Smoking status: Every Day    Packs/day: 0.25    Years: 30.00    Total pack years: 7.50    Types: Cigarettes   Smokeless tobacco: Never  Vaping Use   Vaping Use: Never used  Substance Use Topics   Alcohol use: Yes    Comment: 3 pints/month   Drug use: Yes    Types: Marijuana    Comment: occassionally    Review of symptoms:  Constitutional:  Negative for unexplained weight loss, night sweats, fever, chills ENT:  Negative for nose bleeds, sinus pain, painful swallowing CV:   Negative for chest pain, shortness of breath, exercise intolerance, palpitations, loss of consciousness Resp:  Negative for cough, wheezing, shortness of breath GI:  Negative for nausea, vomiting, diarrhea, bloody stools GU:  Positives noted in HPI; otherwise negative for gross hematuria, dysuria Neuro:  Negative for seizures, poor balance, limb weakness, slurred speech Psych:  Negative for lack of energy, depression, anxiety Endocrine:  Negative for polydipsia, polyuria, symptoms of hypoglycemia (dizziness, hunger, sweating) Hematologic:  Negative for anemia, purpura, petechia, prolonged or excessive bleeding, use of anticoagulants  Allergic:  Negative for difficulty breathing or choking as a result of exposure to anything; no shellfish allergy; no allergic response (rash/itch) to materials, foods  Physical exam: BP 117/77   Pulse 75   Ht '6\' 3"'$  (1.905 m)   Wt 185 lb (83.9 kg)   BMI 23.12 kg/m  GENERAL APPEARANCE:  Well appearing, well developed,  well nourished, NAD HEENT: Atraumatic, Normocephalic, oropharynx clear. NECK: Supple without lymphadenopathy or thyromegaly. LUNGS: Clear to auscultation bilaterally. HEART: Regular Rate and Rhythm without murmurs, gallops, or rubs. ABDOMEN: Soft, non-tender, No Masses. EXTREMITIES: Moves all extremities well.  Without clubbing, cyanosis, or edema. NEUROLOGIC:  Alert and oriented x 3, normal gait, CN II-XII grossly intact.  MENTAL STATUS:  Appropriate. BACK:  Non-tender to palpation.  No CVAT SKIN:  Warm, dry and intact.    Results: U/A: 0-5 WBCs, 0-2 RBCs, no bacteria

## 2021-11-18 NOTE — Progress Notes (Signed)
I broke some ribs.  He fell and hurt his left side.  X-rays done on 10-27-21 showed nondisplaced fractures of the 11th and 12th ribs on the left side.  He has seen [facility] and referred here.   He has pain but less now.  He is uncomfortable at times.  He has no breathing problems.  He has tenderness of the left lower rib cage.  There is no subcutaneous air, full inspiration and expiration, NV intact.  I have independently reviewed and interpreted x-rays of this patient done at another site by another physician or qualified health professional.  I have reviewed ER notes and notes of Dr. Moshe Cipro.  BP 123/74   Pulse 61   Ht '6\' 3"'$  (1.905 m)   Wt 188 lb 6 oz (85.4 kg)   BMI 23.55 kg/m    Encounter Diagnosis  Name Primary?   Closed fracture of multiple ribs of left side, initial encounter Yes   I will call in pain medicine.    I have explained it takes about six weeks or so to heal.  I have gone over activities he should avoid.  Call if any problem.  Precautions discussed.  Return in three weeks.  I have reviewed the [facility] web site prior to prescribing narcotic medicine for this patient.  Electronically Signed Sanjuana Kava, MD 11/14/20239:22 AM

## 2021-11-18 NOTE — Patient Instructions (Addendum)
 As the weather changes and gets cooler, you may notice you are affected more. You may have more pain in your joints. This is normal. Dress warmly and make sure that area is covered well.   Dr.Keeling is here all day on Tuesdays, Wednesday mornings, and Thursday mornings. If you need anything such as a medication refill, please either call BEFORE the end of the day on [facility] or send a message through [place]. Your pharmacy can send a refill request for you. Calling by the end of the day on [facility] allows [place] time to send Dr.Keeling the request and for him to respond before he leaves on Thursdays.  If Dr. Luna Glasgow is out of the office, we may send it to one of the other providers and they may not refill it for the same amount that your original prescription is for.   MY NAME IS  AND I AM DR.KEELING'S ASSISTANT. IF YOU NEED ANYTHING, PLEASE DO NOT HESITATE TO EITHER SEND ME A MESSAGE VIA MYCHART OR CALL THE OFFICE [phone] AND LEAVE A MESSAGE FOR ME. I WILL RESPOND WITHIN 24-48 BUSINESS HOURS.   Smoking Tobacco Information, Adult Smoking tobacco can be harmful to your health. Tobacco contains a toxic colorless chemical called nicotine. Nicotine causes changes in your brain that make you want more and more. This is called addiction. This can make it hard to stop smoking once you start. Tobacco also has other toxic chemicals that can hurt your body and raise your risk of many cancers. Menthol or "lite" tobacco or cigarette brands are not safer than regular brands. How can smoking tobacco affect me? Smoking tobacco puts you at risk for: Cancer. Smoking is most commonly associated with lung cancer, but can also lead to cancer in other parts of the body. Chronic obstructive pulmonary disease (COPD). This is a long-term lung condition that makes it hard to breathe. It also gets worse over time. High blood pressure (hypertension), heart disease, stroke, heart attack, and lung infections, such as  pneumonia. Cataracts. This is when the lenses in the eyes become clouded. Digestive problems. This may include peptic ulcers, heartburn, and gastroesophageal reflux disease (GERD). Oral health problems, such as gum disease, mouth sores, and tooth loss. Loss of taste and smell. Smoking also affects how you look and smell. Smoking may cause: Wrinkles. Yellow or stained teeth, fingers, and fingernails. Bad breath. Bad-smelling clothes and hair. Smoking tobacco can also affect your social life, because: It may be challenging to find places to smoke when away from home. Many workplaces, [employer], hotels, and public places are tobacco-free. Smoking is expensive. This is due to the cost of tobacco and the long-term costs of treating health problems from smoking. Secondhand smoke may affect those around you. Secondhand smoke can cause lung cancer, breathing problems, and heart disease. Children of smokers have a higher risk for: Sudden infant death syndrome (SIDS). Ear infections. Lung infections. What actions can I take to prevent health problems? Quit smoking  Do not start smoking. Quit if you already smoke. Do not replace cigarette smoking with vaping devices, such as e-cigarettes. Make a plan to quit smoking and commit to it. Look for programs to help you, and ask your health care provider for recommendations and ideas. Set a date and write down all the reasons you want to quit. Let your friends and family know you are quitting so they can help and support you. Consider finding friends who also want to quit. It can be easier to quit with  someone else, so that you can support each other. Talk with your health care provider about using nicotine replacement medicines to help you quit. These include gum, lozenges, patches, sprays, or pills. If you try to quit but return to smoking, stay positive. It is common to slip up when you first quit, so take it one day at a time. Be prepared for cravings.  When you feel the urge to smoke, chew gum or suck on hard candy. Lifestyle Stay busy. Take care of your body. Get plenty of exercise, eat a healthy diet, and drink plenty of water. Find ways to manage your stress, such as meditation, yoga, exercise, or time spent with friends and family. Ask your health care provider about having regular tests (screenings) to check for cancer. This may include blood tests, imaging tests, and other tests. Where to find support To get support to quit smoking, consider: Asking your health care provider for more information and resources. Joining a support group for people who want to quit smoking in your local community. There are many effective programs that may help you to quit. Calling the smokefree.gov counselor helpline at 1-800-QUIT-NOW 367-204-7346). Where to find more information You may find more information about quitting smoking from: Centers for Disease Control and Prevention: https://www.chang-huffman.com/ https://hall.com/: smokefree.gov American Lung Association: freedomfromsmoking.org Contact a health care provider if: You have problems breathing. Your lips, nose, or fingers turn blue. You have chest pain. You are coughing up blood. You feel like you will faint. You have other health changes that cause you to worry. Summary Smoking tobacco can negatively affect your health, the health of those around you, your finances, and your social life. Do not start smoking. Quit if you already smoke. If you need help quitting, ask your health care provider. Consider joining a support group for people in your local community who want to quit smoking. There are many effective programs that may help you to quit. This information is not intended to replace advice given to you by your health care provider. Make sure you discuss any questions you have with your health care provider. Document Revised: 12/17/2020 Document Reviewed: 12/17/2020 Elsevier Patient Education  2023  Elsevier In

## 2021-11-19 LAB — MICROSCOPIC EXAMINATION: Bacteria, UA: NONE SEEN

## 2021-11-19 LAB — URINALYSIS, ROUTINE W REFLEX MICROSCOPIC
Bilirubin, UA: NEGATIVE
Glucose, UA: NEGATIVE
Leukocytes,UA: NEGATIVE
Nitrite, UA: NEGATIVE
RBC, UA: NEGATIVE
Specific Gravity, UA: 1.015 (ref 1.005–1.030)
Urobilinogen, Ur: 2 mg/dL — ABNORMAL HIGH (ref 0.2–1.0)
pH, UA: 6 (ref 5.0–7.5)

## 2021-11-24 ENCOUNTER — Telehealth: Payer: Self-pay | Admitting: Radiology

## 2021-11-24 MED ORDER — HYDROCODONE-ACETAMINOPHEN 7.5-325 MG PO TABS: 1.0000 | ORAL_TABLET | Freq: Four times a day (QID) | ORAL | 0 refills | Status: AC | PRN

## 2021-11-24 NOTE — Telephone Encounter (Signed)
Patient called requesting refill of pain medication.  I do not see any listed in meds list as current.  Please review meds history in chart for previous.

## 2021-11-24 NOTE — Addendum Note (Signed)
Addended by: Willette Pa on: 11/24/2021 11:43 PM   Modules accepted: Orders

## 2021-12-04 ENCOUNTER — Encounter: Payer: Medicare PPO | Admitting: Orthopedic Surgery

## 2021-12-05 ENCOUNTER — Other Ambulatory Visit: Payer: Self-pay

## 2021-12-05 NOTE — Telephone Encounter (Addendum)
Hydrocodone-Acetaminophen 5/325 MG Qty 60 Tablets   Take 1 tablet by mouth every 6 (six) hours as needed for pain.   PATIENT USES [facility]  He is asking if Dr. Aline Brochure will change it to 7.5/325

## 2021-12-08 ENCOUNTER — Other Ambulatory Visit: Payer: Self-pay | Admitting: Family Medicine

## 2021-12-09 ENCOUNTER — Encounter: Payer: Self-pay | Admitting: Orthopaedic Surgery

## 2021-12-09 ENCOUNTER — Ambulatory Visit (INDEPENDENT_AMBULATORY_CARE_PROVIDER_SITE_OTHER): Payer: Medicare PPO | Admitting: Orthopaedic Surgery

## 2021-12-09 VITALS — BP 100/52 | HR 66 | Ht 75.0 in | Wt 185.0 lb

## 2021-12-09 DIAGNOSIS — S2242XD Multiple fractures of ribs, left side, subsequent encounter for fracture with routine healing: Secondary | ICD-10-CM | POA: Diagnosis not present

## 2021-12-09 MED ORDER — HYDROCODONE-ACETAMINOPHEN 7.5-325 MG PO TABS: 1.0000 | ORAL_TABLET | Freq: Four times a day (QID) | ORAL | 0 refills | Status: DC | PRN

## 2021-12-09 NOTE — Progress Notes (Signed)
My ribs are better.  He still has some rib pain but is better.  He has no cough and no breathing issues.  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  Encounter Diagnosis  Name Primary?   Closed fracture of multiple ribs of left side with routine healing, subsequent encounter Yes   He requests pain medicine.  Computer shows that Dr. Aline Brochure gave Rx today.  I will hold off.  Return in one month.  Call if any problem.  Precautions discussed.  Electronically Signed Sanjuana Kava, MD 12/5/20232:05 PM

## 2021-12-09 NOTE — Telephone Encounter (Signed)
He most recently was here to see Dr Luna Glasgow [place] fractures asking for stronger Hydrocodone from Dr Aline Brochure, please advise/ pended

## 2021-12-11 ENCOUNTER — Telehealth: Payer: Self-pay | Admitting: Family Medicine

## 2021-12-11 MED ORDER — VALSARTAN 40 MG PO TABS: 40.0000 mg | ORAL_TABLET | Freq: Every day | ORAL | 2 refills | Status: DC

## 2021-12-11 NOTE — Telephone Encounter (Signed)
Light headed and qweak, BP low today at Ortho visit, check in office 100/60. Stop olmesartan /hCTZ, 20/12/5, new is diovan 40 mg daily Needs appt first 4 to 5 week

## 2021-12-11 NOTE — Telephone Encounter (Signed)
Appt 01/09/22 @ 2:40pm patient aware

## 2022-01-06 ENCOUNTER — Ambulatory Visit: Payer: Medicare PPO | Admitting: Orthopaedic Surgery

## 2022-01-09 ENCOUNTER — Ambulatory Visit: Payer: Medicare PPO | Admitting: Family Medicine

## 2022-01-09 ENCOUNTER — Telehealth: Payer: Self-pay | Admitting: Family Medicine

## 2022-01-09 NOTE — Telephone Encounter (Signed)
Good afternoon, pt has missed 3 appts as of today (03/04/21, 03/31/21, 01/09/22). How would you like to proceed?

## 2022-01-13 ENCOUNTER — Encounter: Payer: Self-pay | Admitting: Orthopaedic Surgery

## 2022-01-13 ENCOUNTER — Ambulatory Visit: Payer: Medicare PPO | Admitting: Orthopaedic Surgery

## 2022-01-13 VITALS — BP 150/85 | HR 61

## 2022-01-13 DIAGNOSIS — S2242XD Multiple fractures of ribs, left side, subsequent encounter for fracture with routine healing: Secondary | ICD-10-CM | POA: Diagnosis not present

## 2022-01-13 MED ORDER — HYDROCODONE-ACETAMINOPHEN 7.5-325 MG PO TABS: 1.0000 | ORAL_TABLET | Freq: Four times a day (QID) | ORAL | 0 refills | Status: DC | PRN

## 2022-01-13 NOTE — Progress Notes (Signed)
I am much better.  He has less pain of the left lower rib cage.  He has no new trauma.  He can hold his breath and cough with no pain.  NV intact. Encounter Diagnosis  Name Primary?   Closed fracture of multiple ribs of left side with routine healing, subsequent encounter Yes   I will refill pain medicine.  Return prn.  Call if any problem.  Precautions discussed.  Electronically Signed Sanjuana Kava, MD 1/9/20243:09 PM

## 2022-01-28 ENCOUNTER — Other Ambulatory Visit: Payer: Self-pay | Admitting: Family Medicine

## 2022-02-23 DIAGNOSIS — H01002 Unspecified blepharitis right lower eyelid: Secondary | ICD-10-CM | POA: Diagnosis not present

## 2022-02-23 DIAGNOSIS — H34832 Tributary (branch) retinal vein occlusion, left eye, with macular edema: Secondary | ICD-10-CM | POA: Diagnosis not present

## 2022-02-23 DIAGNOSIS — H26492 Other secondary cataract, left eye: Secondary | ICD-10-CM | POA: Diagnosis not present

## 2022-02-23 DIAGNOSIS — H01001 Unspecified blepharitis right upper eyelid: Secondary | ICD-10-CM | POA: Diagnosis not present

## 2022-02-24 ENCOUNTER — Other Ambulatory Visit: Payer: Self-pay | Admitting: Gastroenterology

## 2022-02-24 DIAGNOSIS — D126 Benign neoplasm of colon, unspecified: Secondary | ICD-10-CM

## 2022-02-24 DIAGNOSIS — K219 Gastro-esophageal reflux disease without esophagitis: Secondary | ICD-10-CM

## 2022-02-24 IMAGING — CT CT HEAD W/O CM
3 series · 16 of 47 positions shown, 19 images · non-contrast
Comparison: CT brain 04/16/2018

CLINICAL DATA: Right-sided headache

EXAM:
CT HEAD WITHOUT CONTRAST
TECHNIQUE: Contiguous axial images were obtained from the base of the skull
through the vertex without intravenous contrast.

[Series 3: head w o · axial · 0.50mm/px · z∈[+1365,+1500]mm · 10 of 33 slices shown, 13 images]
[im 3/33  brain]
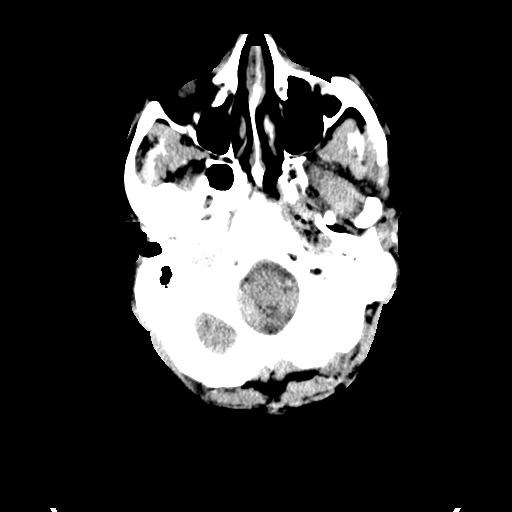
[im 3/33  bone]
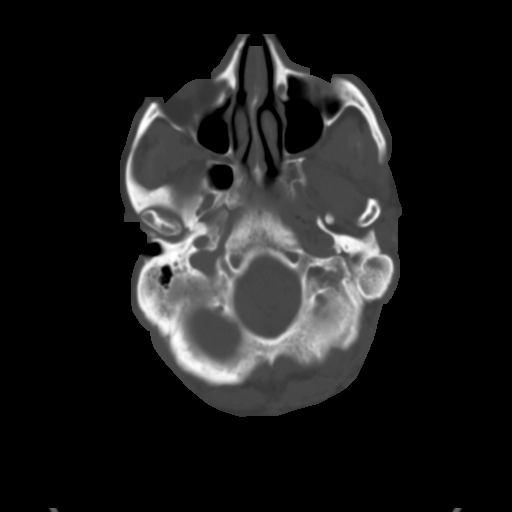
[im 6/33  brain]
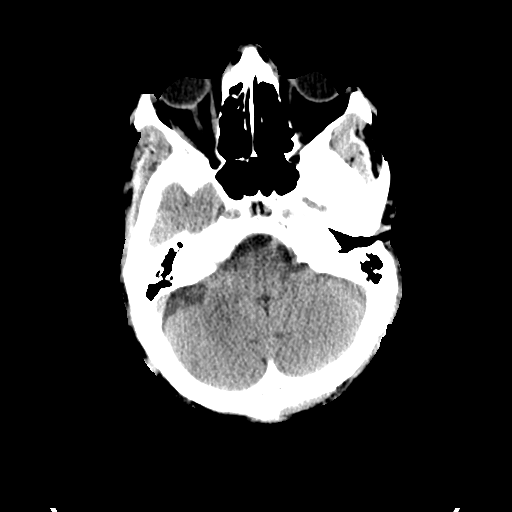
[im 9/33  brain]
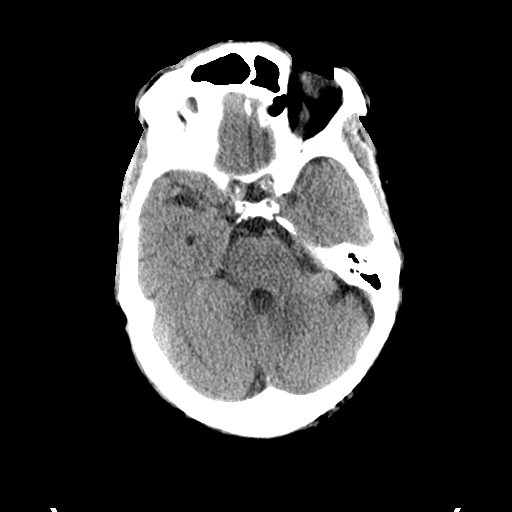
[im 12/33  brain]
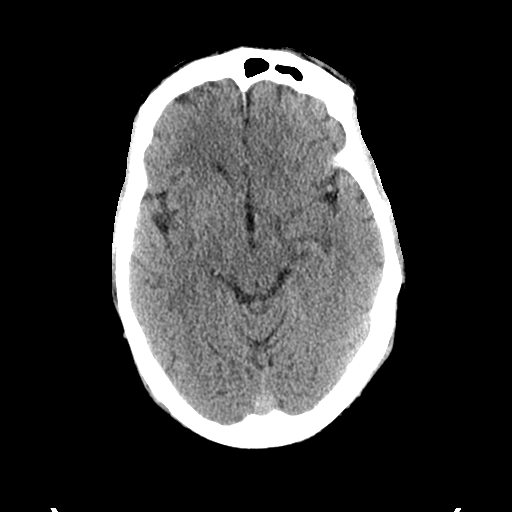
[im 15/33  brain]
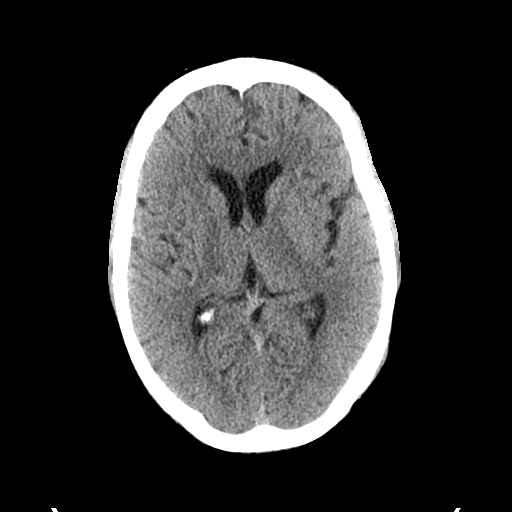
[im 15/33  bone]
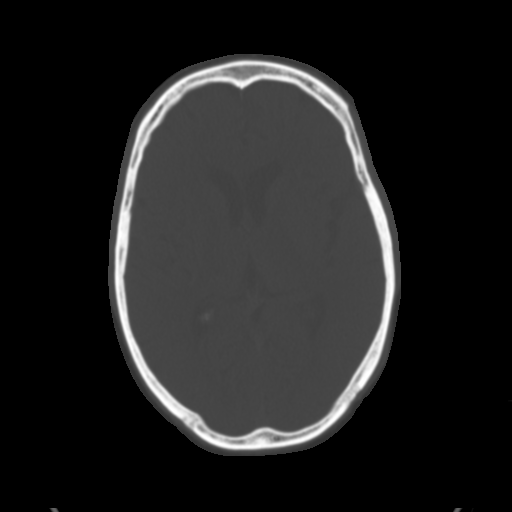
[im 18/33  brain]
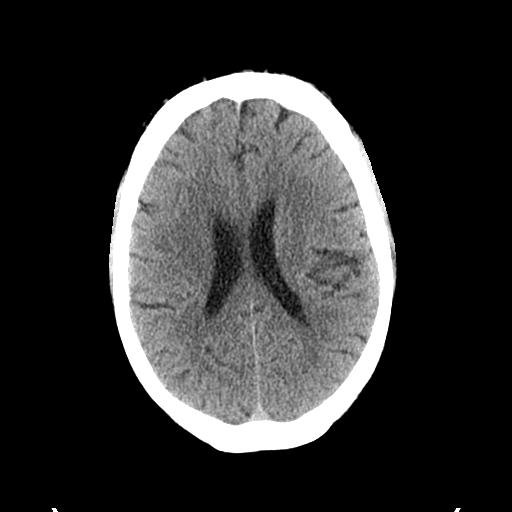
[im 21/33  brain]
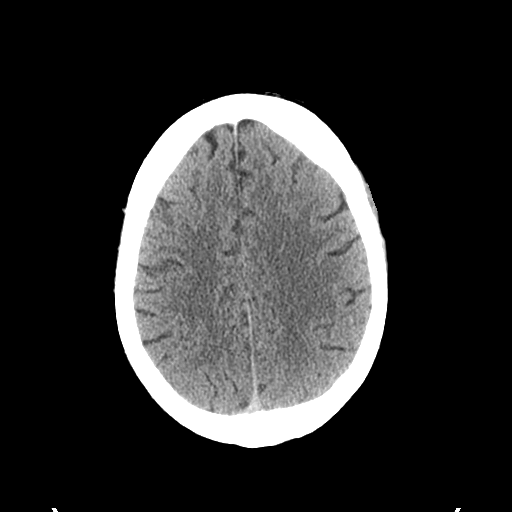
[im 25/33  brain]
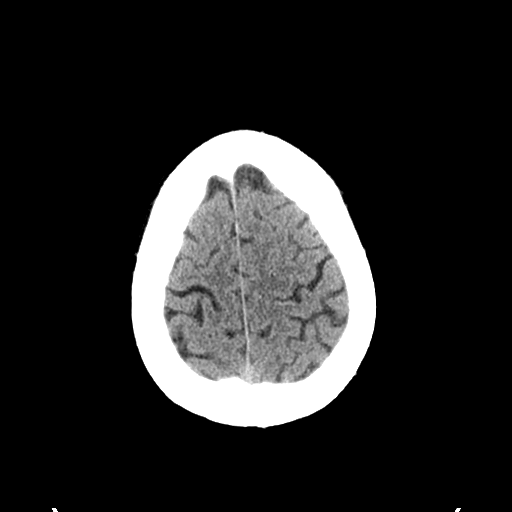
[im 27/33  brain]
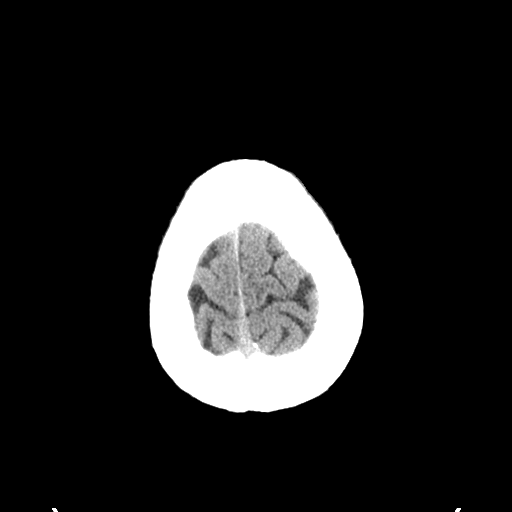
[im 27/33  bone]
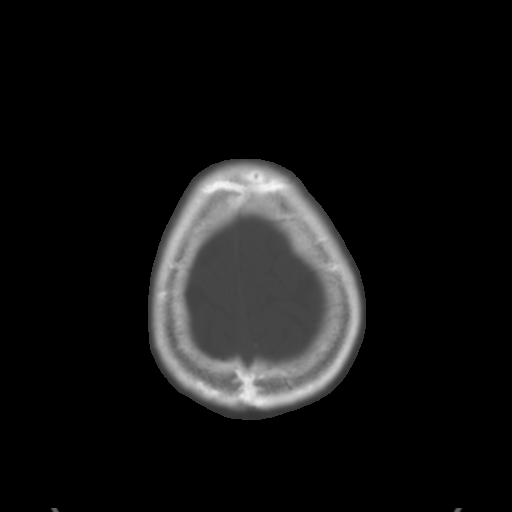
[im 30/33  brain]
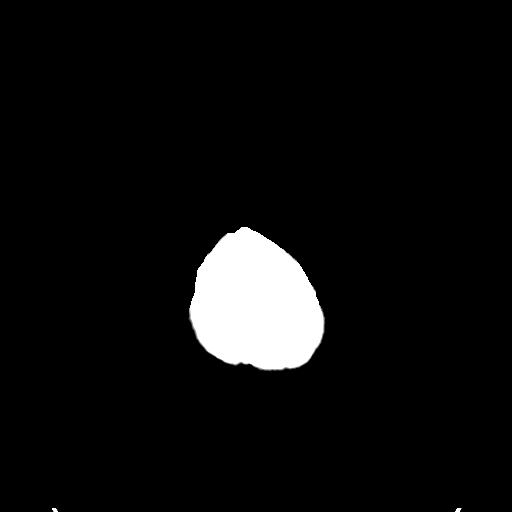

[Series 5: coronal soft · coronal · 0.36mm/px · 3 of 78 slices shown]
[im 26/78  brain]
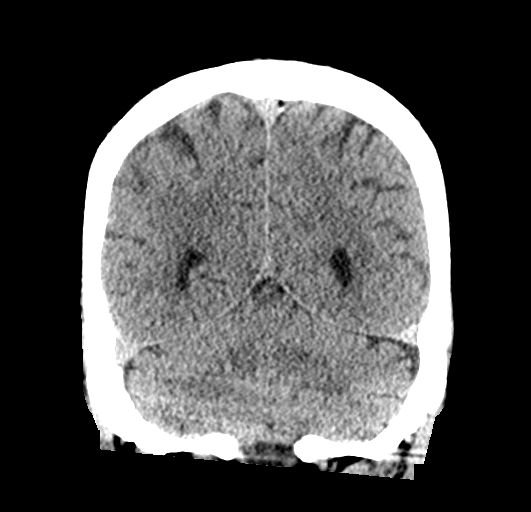
[im 35/78  brain]
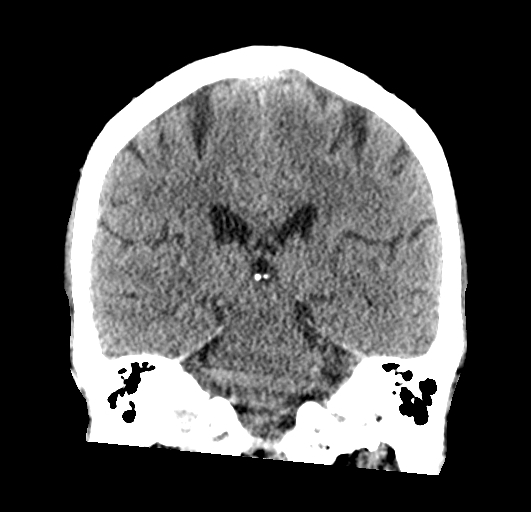
[im 43/78  brain]
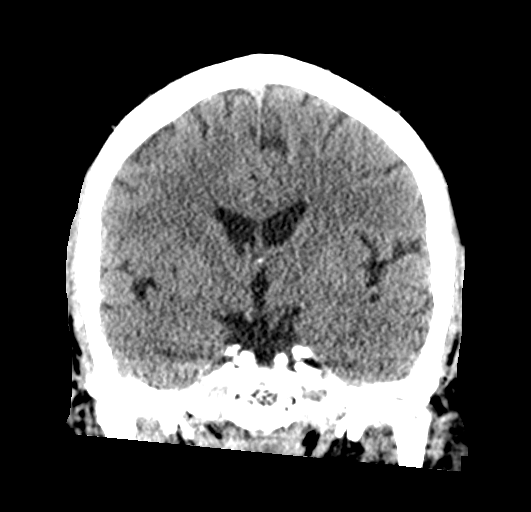

[Series 6: sagittal soft · sagittal · 0.38mm/px · 3 of 57 slices shown]
[im 19/57  brain]
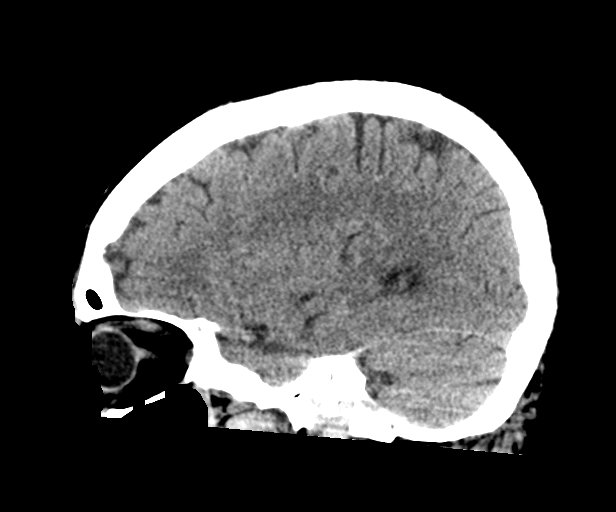
[im 29/57  brain]
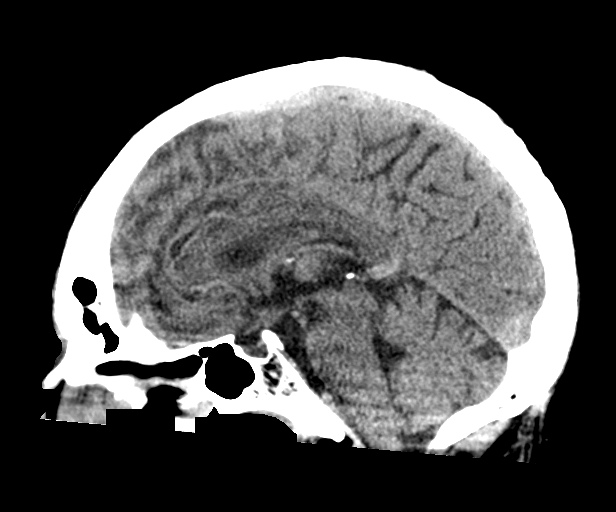
[im 38/57  brain]
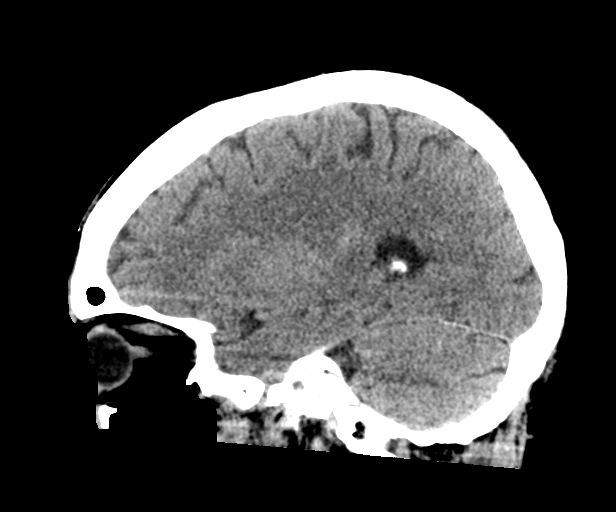

[16 of 47 positions shown; findings below may reference images not displayed]

FINDINGS: Brain: No acute territorial infarction, hemorrhage or intracranial
mass. The ventricles are nonenlarged.

Vascular: No hyperdense vessels. Scattered carotid vascular
calcification

Skull: Normal. Negative for fracture or focal lesion.

Sinuses/Orbits: Old appearing fracture of the medial wall left
orbit. Sinuses are clear

Other: None
IMPRESSION: No CT evidence for acute intracranial abnormality.

## 2022-02-24 NOTE — Telephone Encounter (Signed)
It has almost been 2 years since we last saw patient. Recommend OV for additional refills. Please let patient know and arrange follow-up.

## 2022-02-24 NOTE — Telephone Encounter (Signed)
No refills until seen  

## 2022-02-24 NOTE — Progress Notes (Unsigned)
GI Office Note    Referring Provider: Fayrene Helper, MD Primary Care Physician:  Fayrene Helper, MD  Primary Gastroenterologist:  Chief Complaint   No chief complaint on file.   History of Present Illness   Matthew Adkins is a 72 y.o. male presenting today for follow up. Last seen in 03/2020. H/o GERD, colon polyps.    EGD  TCS       Medications   Current Outpatient Medications  Medication Sig Dispense Refill   amLODipine (NORVASC) 10 MG tablet TAKE 1 TABLET BY MOUTH ONCE DAILY. 90 tablet 0   buPROPion (WELLBUTRIN SR) 150 MG 12 hr tablet Take 1 tablet (150 mg total) by mouth 2 (two) times daily. 60 tablet 3   Carboxymethylcellul-Glycerin (LUBRICATING EYE DROPS OP) Place 1 drop into both eyes daily as needed (dry eyes).     FLUoxetine (PROZAC) 20 MG capsule TAKE 1 CAPSULE BY MOUTH DAILY. 90 capsule 0   HYDROcodone-acetaminophen (NORCO) 7.5-325 MG tablet Take 1 tablet by mouth every 6 (six) hours as needed for moderate pain. 30 tablet 0   montelukast (SINGULAIR) 10 MG tablet TAKE 1 TABLET BY MOUTH ONCE AT BEDTIME. 30 tablet 0   Multiple Vitamins-Minerals (ONE DAILY MENS 50+ MULTIVIT PO) Take 1 tablet by mouth daily.     omeprazole (PRILOSEC) 20 MG capsule TAKE 1 CAPSULE BY MOUTH 30 MINUTES PRIOR TO BREAKFAST. 90 capsule 3   polyethylene glycol (MIRALAX) 17 g packet Take 17 g by mouth daily. (Patient taking differently: Take 17 g by mouth daily as needed for mild constipation.) 14 each 0   simethicone (MYLICON) 80 MG chewable tablet TAKE (1) TABLET BY MOUTH ONCE DAILY. 100 tablet 0   valsartan (DIOVAN) 40 MG tablet Take 1 tablet (40 mg total) by mouth daily. 30 tablet 2   No current facility-administered medications for this visit.    Allergies   Allergies as of 02/25/2022 - Review Complete 01/13/2022  Allergen Reaction Noted   Bee venom Nausea Only 09/06/2011   Codeine Nausea And Vomiting 01/03/2009   Lime oil Rash 02/18/2021     Past Medical  History   Past Medical History:  Diagnosis Date   Arthritis    Bladder cancer ([place]) 2000   Depression    Essential hypertension 01/16/2018   Nicotine addiction    Prostate cancer ([place]) 07/06/2014   Seasonal allergies     Past Surgical History   Past Surgical History:  Procedure Laterality Date   athroscopy of right knee  2002   cancerous polyps removed from bladder  2000   CATARACT EXTRACTION W/PHACO Left 03/01/2020   Procedure: CATARACT EXTRACTION PHACO AND INTRAOCULAR LENS PLACEMENT ([place]);  Surgeon: Baruch Goldmann, MD;  Location: [facility];  Service: Ophthalmology;  Laterality: Left;  CDE  4.96   CATARACT EXTRACTION W/PHACO Right 03/22/2020   Procedure: CATARACT EXTRACTION PHACO AND INTRAOCULAR LENS PLACEMENT RIGHT EYE;  Surgeon: Baruch Goldmann, MD;  Location: [facility];  Service: Ophthalmology;  Laterality: Right;  CDE 5.78   COLONOSCOPY  MJ 2009 pmhX: POLYPS   POLYP REMOVED BUT NOT RETRIEVED   COLONOSCOPY  LS 2007 ARS   ? POLYPS, no path available   COLONOSCOPY  09/26/2010   sessile poylp(2) pan-colonic diverticulosis,mild/internal hemorrhoids   COLONOSCOPY WITH PROPOFOL N/A 02/09/2017   Procedure: COLONOSCOPY WITH PROPOFOL;  Surgeon: Danie Binder, MD;  Location: [facility];  Service: Endoscopy;  Laterality: N/A;  8:30am   COLONOSCOPY WITH PROPOFOL N/A 04/26/2020  Procedure: COLONOSCOPY WITH PROPOFOL;  Surgeon: Eloise Harman, DO;  [facility]: [facility];  Service: Endoscopy;  Laterality: N/A;  AM   ESOPHAGOGASTRODUODENOSCOPY (EGD) WITH PROPOFOL N/A 02/09/2017   Procedure: ESOPHAGOGASTRODUODENOSCOPY (EGD) WITH PROPOFOL;  Surgeon: Danie Binder, MD;  [facility];  Service: Endoscopy;  Laterality: N/A;   FOOT SURGERY Bilateral 2004   HERNIA REPAIR  1980's   left inguinal hernia   INGUINAL HERNIA REPAIR Right 02/21/2021   Procedure: HERNIA REPAIR INGUINAL ADULT w/ MESH;  Surgeon: Virl Cagey, MD;  Location: [facility];  Service: General;  Laterality: Right;    LYMPHADENECTOMY Bilateral 09/03/2014   Procedure: PELVIC LYMPHADENECTOMY;  Surgeon: Raynelle Bring, MD;  Location: [facility];  Service: Urology;  Laterality: Bilateral;   POLYPECTOMY  02/09/2017   Procedure: POLYPECTOMY;  Surgeon: Danie Binder, MD;  Location: [facility];  Service: Endoscopy;;  cecal polyp, hepatic flexure polyps x3, transverse colon polyp, descending colon polyps x2, rectal polyps x2   POLYPECTOMY  04/26/2020   Procedure: POLYPECTOMY;  Surgeon: Eloise Harman, DO;  Location: [facility];  Service: Endoscopy;;   PROSTATE BIOPSY  07/06/14   ROBOT ASSISTED LAPAROSCOPIC RADICAL PROSTATECTOMY N/A 09/03/2014   Procedure: ROBOTIC ASSISTED LAPAROSCOPIC RADICAL PROSTATECTOMY LEVEL 2;  Surgeon: Raynelle Bring, MD;  Location: [facility];  Service: Urology;  Laterality: N/A;    Past Family History   Family History  Problem Relation Age of Onset   Heart attack Father    Diabetes Sister    Hypertension Sister    Hypertension Sister    Aneurysm Sister        brain    Arthritis Other        family history    Colon cancer Neg Hx    Colon polyps Neg Hx     Past Social History   Social History   Socioeconomic History   Marital status: Married    Spouse name: Not on file   Number of children: 2   Years of education: Not on file   Highest education level: Not on file  Occupational History   Occupation: unemployed   Tobacco Use   Smoking status: Every Day    Packs/day: 0.25    Years: 30.00    Total pack years: 7.50    Types: Cigarettes   Smokeless tobacco: Never  Vaping Use   Vaping Use: Never used  Substance and Sexual Activity   Alcohol use: Yes    Comment: 3 pints/month   Drug use: Yes    Types: Marijuana    Comment: occassionally   Sexual activity: Yes    Birth control/protection: None  Other Topics Concern   Not on file  Social History Narrative   Not on file   Social Determinants of Health   Financial Resource Strain: Low Risk  (03/27/2019)   Overall  Financial Resource Strain (CARDIA)    Difficulty of Paying Living Expenses: Not very hard  Food Insecurity: No Food Insecurity (03/15/2017)   Hunger Vital Sign    Worried About Running Out of Food in the Last Year: Never true    Ran Out of Food in the Last Year: Never true  Transportation Needs: No Transportation Needs (03/27/2020)   PRAPARE - Hydrologist (Medical): No    Lack of Transportation (Non-Medical): No  Physical Activity: Insufficiently Active (03/15/2017)   Exercise Vital Sign    Days of Exercise per Week: 2 days    Minutes  of Exercise per Session: 30 min  Stress: No Stress Concern Present (03/27/2019)   Santa Claus    Feeling of Stress : Only a little  Social Connections: Moderately Isolated (03/27/2020)   Social Connection and Isolation Panel [NHANES]    Frequency of Communication with Friends and Family: Never    Frequency of Social Gatherings with Friends and Family: Never    Attends Religious Services: Never    Marine scientist or Organizations: Yes    Attends Music therapist: More than 4 times per year    Marital Status: Married  Human resources officer Violence: Not At Risk (03/15/2017)   Humiliation, Afraid, Rape, and Kick questionnaire    Fear of Current or Ex-Partner: No    Emotionally Abused: No    Physically Abused: No    Sexually Abused: No    Review of Systems   General: Negative for anorexia, weight loss, fever, chills, fatigue, weakness. ENT: Negative for hoarseness, difficulty swallowing , nasal congestion. CV: Negative for chest pain, angina, palpitations, dyspnea on exertion, peripheral edema.  Respiratory: Negative for dyspnea at rest, dyspnea on exertion, cough, sputum, wheezing.  GI: See history of present illness. GU:  Negative for dysuria, hematuria, urinary incontinence, urinary frequency, nocturnal urination.  Endo: Negative for unusual  weight change.     Physical Exam   There were no vitals taken for this visit.   General: Well-nourished, well-developed in no acute distress.  Eyes: No icterus. Mouth: Oropharyngeal mucosa moist and pink , no lesions erythema or exudate. Lungs: Clear to auscultation bilaterally.  Heart: Regular rate and rhythm, no murmurs rubs or gallops.  Abdomen: Bowel sounds are normal, nontender, nondistended, no hepatosplenomegaly or masses,  no abdominal bruits or hernia , no rebound or guarding.  Rectal: ***  Extremities: No lower extremity edema. No clubbing or deformities. Neuro: Alert and oriented x 4   Skin: Warm and dry, no jaundice.   Psych: Alert and cooperative, normal mood and affect.  Labs   Lab Results  Component Value Date   CREATININE 0.80 10/27/2021   BUN 12 10/27/2021   NA 137 10/27/2021   K 3.1 (L) 10/27/2021   CL 100 10/27/2021   CO2 23 10/14/2021   Lab Results  Component Value Date   WBC 5.3 02/13/2021   HGB 14.6 10/27/2021   HCT 43.0 10/27/2021   MCV 93.8 02/13/2021   PLT 233 02/13/2021   Lab Results  Component Value Date   ALT 17 02/13/2021   AST 18 02/13/2021   ALKPHOS 56 02/13/2021   BILITOT 1.1 02/13/2021    Imaging Studies   No results found.  Assessment       PLAN   ***   Laureen Ochs. Bobby Rumpf, [place], [facility]

## 2022-02-25 ENCOUNTER — Ambulatory Visit: Payer: Medicare PPO | Admitting: Gastroenterology

## 2022-02-25 ENCOUNTER — Encounter: Payer: Self-pay | Admitting: Gastroenterology

## 2022-02-25 VITALS — BP 135/79 | HR 61 | Temp 98.3°F | Ht 75.0 in | Wt 194.6 lb

## 2022-02-25 DIAGNOSIS — K219 Gastro-esophageal reflux disease without esophagitis: Secondary | ICD-10-CM

## 2022-02-25 MED ORDER — OMEPRAZOLE 20 MG PO CPDR: DELAYED_RELEASE_CAPSULE | ORAL | 3 refills | Status: DC

## 2022-02-25 NOTE — Patient Instructions (Addendum)
Continue omeprazole 44m daily before breakfast.  Return to the office in 2 years or call sooner if needed.   It was a pleasure to see you today. I want to create trusting relationships with patients and provide genuine, compassionate, and quality care. I truly value your feedback, so please be on the lookout for a survey regarding your visit with me today. I appreciate your time in completing this!

## 2022-02-27 DIAGNOSIS — H34812 Central retinal vein occlusion, left eye, with macular edema: Secondary | ICD-10-CM | POA: Diagnosis not present

## 2022-02-27 DIAGNOSIS — H43812 Vitreous degeneration, left eye: Secondary | ICD-10-CM | POA: Diagnosis not present

## 2022-02-27 DIAGNOSIS — H43821 Vitreomacular adhesion, right eye: Secondary | ICD-10-CM | POA: Diagnosis not present

## 2022-03-05 ENCOUNTER — Other Ambulatory Visit: Payer: Self-pay | Admitting: Family Medicine

## 2022-03-18 ENCOUNTER — Ambulatory Visit: Payer: Medicare PPO | Admitting: Internal Medicine

## 2022-03-18 ENCOUNTER — Telehealth: Payer: Self-pay | Admitting: Family Medicine

## 2022-03-18 ENCOUNTER — Ambulatory Visit: Payer: Medicare PPO | Admitting: Family Medicine

## 2022-03-18 NOTE — Telephone Encounter (Signed)
Pt has missed 4 appts (03/04/21, 03/31/21, 01/09/22 & 3/13/240. How would you like to proceed?

## 2022-03-19 DIAGNOSIS — H34812 Central retinal vein occlusion, left eye, with macular edema: Secondary | ICD-10-CM | POA: Diagnosis not present

## 2022-04-01 ENCOUNTER — Other Ambulatory Visit: Payer: Self-pay | Admitting: Family Medicine

## 2022-04-13 NOTE — Progress Notes (Unsigned)
This encounter was created in error - please disregard.

## 2022-04-16 DIAGNOSIS — H43821 Vitreomacular adhesion, right eye: Secondary | ICD-10-CM | POA: Diagnosis not present

## 2022-04-16 DIAGNOSIS — H43812 Vitreous degeneration, left eye: Secondary | ICD-10-CM | POA: Diagnosis not present

## 2022-04-16 DIAGNOSIS — H34812 Central retinal vein occlusion, left eye, with macular edema: Secondary | ICD-10-CM | POA: Diagnosis not present

## 2022-04-21 ENCOUNTER — Ambulatory Visit: Payer: Medicare PPO | Admitting: Family Medicine

## 2022-04-23 ENCOUNTER — Encounter: Payer: Self-pay | Admitting: Family Medicine

## 2022-04-23 ENCOUNTER — Ambulatory Visit (INDEPENDENT_AMBULATORY_CARE_PROVIDER_SITE_OTHER): Payer: Medicare PPO | Admitting: Family Medicine

## 2022-04-23 VITALS — BP 132/77 | HR 64 | Resp 16 | Ht 75.0 in | Wt 194.0 lb

## 2022-04-23 DIAGNOSIS — Z720 Tobacco use: Secondary | ICD-10-CM | POA: Diagnosis not present

## 2022-04-23 DIAGNOSIS — I7143 Infrarenal abdominal aortic aneurysm, without rupture: Secondary | ICD-10-CM | POA: Diagnosis not present

## 2022-04-23 DIAGNOSIS — R7303 Prediabetes: Secondary | ICD-10-CM

## 2022-04-23 DIAGNOSIS — E559 Vitamin D deficiency, unspecified: Secondary | ICD-10-CM | POA: Diagnosis not present

## 2022-04-23 DIAGNOSIS — K219 Gastro-esophageal reflux disease without esophagitis: Secondary | ICD-10-CM

## 2022-04-23 DIAGNOSIS — F419 Anxiety disorder, unspecified: Secondary | ICD-10-CM | POA: Diagnosis not present

## 2022-04-23 DIAGNOSIS — F320 Major depressive disorder, single episode, mild: Secondary | ICD-10-CM | POA: Diagnosis not present

## 2022-04-23 DIAGNOSIS — I10 Essential (primary) hypertension: Secondary | ICD-10-CM | POA: Diagnosis not present

## 2022-04-23 DIAGNOSIS — E785 Hyperlipidemia, unspecified: Secondary | ICD-10-CM | POA: Diagnosis not present

## 2022-04-23 DIAGNOSIS — F17200 Nicotine dependence, unspecified, uncomplicated: Secondary | ICD-10-CM

## 2022-04-23 NOTE — Patient Instructions (Addendum)
Annual exam in October, call if you need me sooner  Continue to work on quitting smoking  Lung cancer screen due in Novemeber   Fasting CBC, lipid , cmp and EGFr, HBA1C, TSH and vit D second week in June  Need Covid vaccine  RSV Vaccine  Shingrix vaccine (2)  TdAP vaccine  All are at your pharmacy  Please get them on separate days  It is important that you exercise regularly at least 30 minutes 5 times a week. If you develop chest pain, have severe difficulty breathing, or feel very tired, stop exercising immediately and seek medical attention  Thanks for choosing [facility], we consider it a privelige to serve you.

## 2022-04-27 ENCOUNTER — Encounter: Payer: Self-pay | Admitting: Family Medicine

## 2022-04-27 DIAGNOSIS — E559 Vitamin D deficiency, unspecified: Secondary | ICD-10-CM | POA: Insufficient documentation

## 2022-04-27 NOTE — Progress Notes (Signed)
Matthew Adkins     MRN: [id number]      DOB: Aug 31, 1950   HPI Matthew Adkins is here for follow up and re-evaluation of chronic medical conditions, medication management and review of any available recent lab and radiology data.  Preventive health is updated, specifically  Cancer screening and Immunization.   Questions or concerns regarding consultations or procedures which the PT has had in the interim are  addressed. The PT denies any adverse reactions to current medications since the last visit.  There are no new concerns.  There are no specific complaints   ROS Denies recent fever or chills. Denies sinus pressure, nasal congestion, ear pain or sore throat. Denies chest congestion, productive cough or wheezing. Denies chest pains, palpitations and leg swelling Denies abdominal pain, nausea, vomiting,diarrhea or constipation.   Denies dysuria, frequency, hesitancy or incontinence. Chronic joint pain, swelling and limitation in mobility. Denies headaches, seizures, numbness, or tingling. Denies  uncontrolled depression, anxiety or insomnia. Denies skin break down or rash.   PE  BP 132/77   Pulse 64   Resp 16   Ht  (1.905 m)   Wt 194 lb (88 kg)   SpO2 93%   BMI 24.25 kg/m   Patient alert and oriented and in no cardiopulmonary distress.  HEENT: No facial asymmetry, EOMI,     Neck supple .  Chest: Clear to auscultation bilaterally.  CVS: S1, S2 no murmurs, no S3.Regular rate.  ABD: Soft non tender.   Ext: No edema  MS: Adequate ROM spine, shoulders, hips and knees.  Skin: Intact, no ulcerations or rash noted.  Psych: Good eye contact, normal affect. Memory intact not anxious or depressed appearing.  CNS: CN 2-12 intact, power,  normal throughout.no focal deficits noted.   Assessment & Plan  Essential hypertension Controlled, no change in medication DASH diet and commitment to daily physical activity for a minimum of 30 minutes discussed and encouraged,  as a part of hypertension management. The importance of attaining a healthy weight is also discussed.     04/23/2022    3:52 PM 02/25/2022   10:06 AM 01/13/2022    3:06 PM 12/09/2021    1:54 PM 11/18/2021    3:21 PM 11/18/2021    8:57 AM 11/11/2021    4:20 PM  BP/Weight  Systolic BP 132 135 150 100 117 123 116  Diastolic BP 77 79 85 52 77 74 70  Wt. (Lbs) 194 194.6  185 185 188.38 179  BMI 24.25 kg/m2 24.32 kg/m2  23.12 kg/m2 23.12 kg/m2 23.55 kg/m2 22.37 kg/m2       Aortic aneurysm (HCC) Vasc f/u in 02/2023 per  note in 2023  GERD (gastroesophageal reflux disease) Controlled, no change in medication   NICOTINE ADDICTION Asked:confirms currently smokes cigarettes Assess: Unwilling to set a quit date, but is cutting back Advise: needs to QUIT to reduce risk of cancer, cardio and cerebrovascular disease Assist: counseled for 5 minutes and literature provided Arrange: follow up in 2 to 4 months   Dyslipidemia Hyperlipidemia:Low fat diet discussed and encouraged.   Lipid Panel  Lab Results  Component Value Date   CHOL 167 06/11/2021   HDL 63 06/11/2021   LDLCALC 93 06/11/2021   LDLDIRECT 105 08/22/2014   TRIG 55 06/11/2021   CHOLHDL 2.7 06/11/2021     Updated lab needed at/ before next visit.   Vitamin D deficiency Updated lab needed at/ before next visit.   Prediabetes Patient educated about the importance  of limiting  Carbohydrate intake , the need to commit to daily physical activity for a minimum of 30 minutes , and to commit weight loss. The fact that changes in all these areas will reduce or eliminate all together the development of diabetes is stressed.   Updated lab needed at/ before next visit.     Latest Ref Rng & Units 10/27/2021   10:34 PM 10/14/2021    4:05 PM 06/11/2021    3:38 PM 02/13/2021    5:03 PM 02/05/2021    2:41 PM  Diabetic Labs  HbA1c 4.8 - 5.6 %  6.1  6.0     Chol 100 - 199 mg/dL   161     HDL >09 mg/dL   63     Calc LDL 0 - 99  mg/dL   93     Triglycerides 0 - 149 mg/dL   55     Creatinine 6.04 - 1.24 mg/dL 5.40  9.81  1.91  4.78  1.30       04/23/2022    3:52 PM 02/25/2022   10:06 AM 01/13/2022    3:06 PM 12/09/2021    1:54 PM 11/18/2021    3:21 PM 11/18/2021    8:57 AM 11/11/2021    4:20 PM  BP/Weight  Systolic BP 132 135 150 100 117 123 116  Diastolic BP 77 79 85 52 77 74 70  Wt. 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

## 2022-04-27 NOTE — Assessment & Plan Note (Signed)
Asked:confirms currently smokes cigarettes °Assess: Unwilling to set a quit date, but is cutting back °Advise: needs to QUIT to reduce risk of cancer, cardio and cerebrovascular disease °Assist: counseled for 5 minutes and literature provided °Arrange: follow up in 2 to 4 months ° °

## 2022-04-27 NOTE — Assessment & Plan Note (Signed)
Controlled, no change in medication  

## 2022-04-27 NOTE — Assessment & Plan Note (Signed)
Vasc f/u in 02/2023 per  note in 2023

## 2022-04-27 NOTE — Assessment & Plan Note (Signed)
Updated lab needed at/ before next visit.   

## 2022-04-27 NOTE — Assessment & Plan Note (Signed)
Controlled, no change in medication DASH diet and commitment to daily physical activity for a minimum of 30 minutes discussed and encouraged, as a part of hypertension management. The importance of attaining a healthy weight is also discussed.     04/23/2022    3:52 PM 02/25/2022   10:06 AM 01/13/2022    3:06 PM 12/09/2021    1:54 PM 11/18/2021    3:21 PM 11/18/2021    8:57 AM 11/11/2021    4:20 PM  BP/Weight  Systolic BP 132 135 150 100 117 123 116  Diastolic BP 77 79 85 52 77 74 70  Wt. (Lbs) 194 194.6  185 185 188.38 179  BMI 24.25 kg/m2 24.32 kg/m2  23.12 kg/m2 23.12 kg/m2 23.55 kg/m2 22.37 kg/m2

## 2022-04-27 NOTE — Assessment & Plan Note (Signed)
Hyperlipidemia:Low fat diet discussed and encouraged.   Lipid Panel  Lab Results  Component Value Date   CHOL 167 06/11/2021   HDL 63 06/11/2021   LDLCALC 93 06/11/2021   LDLDIRECT 105 08/22/2014   TRIG 55 06/11/2021   CHOLHDL 2.7 06/11/2021     Updated lab needed at/ before next visit.

## 2022-04-27 NOTE — Assessment & Plan Note (Signed)
Patient educated about the importance of limiting  Carbohydrate intake , the need to commit to daily physical activity for a minimum of 30 minutes , and to commit weight loss. The fact that changes in all these areas will reduce or eliminate all together the development of diabetes is stressed.   Updated lab needed at/ before next visit.     Latest Ref Rng & Units 10/27/2021   10:34 PM 10/14/2021    4:05 PM 06/11/2021    3:38 PM 02/13/2021    5:03 PM 02/05/2021    2:41 PM  Diabetic Labs  HbA1c 4.8 - 5.6 %  6.1  6.0     Chol 100 - 199 mg/dL   295     HDL >62 mg/dL   63     Calc LDL 0 - 99 mg/dL   93     Triglycerides 0 - 149 mg/dL   55     Creatinine 1.30 - 1.24 mg/dL 8.65  7.84  6.96  2.95  1.30       04/23/2022    3:52 PM 02/25/2022   10:06 AM 01/13/2022    3:06 PM 12/09/2021    1:54 PM 11/18/2021    3:21 PM 11/18/2021    8:57 AM 11/11/2021    4:20 PM  BP/Weight  Systolic BP 132 135 150 100 117 123 116  Diastolic BP 77 79 85 52 77 74 70  Wt. (Lbs) 194 194.6  185 185 188.38 179  BMI 24.25 kg/m2 24.32 kg/m2  23.12 kg/m2 23.12 kg/m2 23.55 kg/m2 22.37 kg/m2       No data to display

## 2022-04-29 ENCOUNTER — Other Ambulatory Visit: Payer: Self-pay | Admitting: Family Medicine

## 2022-05-07 DIAGNOSIS — H43821 Vitreomacular adhesion, right eye: Secondary | ICD-10-CM | POA: Diagnosis not present

## 2022-05-07 DIAGNOSIS — H34812 Central retinal vein occlusion, left eye, with macular edema: Secondary | ICD-10-CM | POA: Diagnosis not present

## 2022-05-11 DIAGNOSIS — S0522XA Ocular laceration and rupture with prolapse or loss of intraocular tissue, left eye, initial encounter: Secondary | ICD-10-CM | POA: Diagnosis not present

## 2022-05-11 DIAGNOSIS — H4302 Vitreous prolapse, left eye: Secondary | ICD-10-CM | POA: Diagnosis not present

## 2022-05-11 DIAGNOSIS — H33312 Horseshoe tear of retina without detachment, left eye: Secondary | ICD-10-CM | POA: Diagnosis not present

## 2022-05-11 DIAGNOSIS — H26492 Other secondary cataract, left eye: Secondary | ICD-10-CM | POA: Diagnosis not present

## 2022-06-02 ENCOUNTER — Other Ambulatory Visit: Payer: Self-pay | Admitting: Family Medicine

## 2022-06-22 DIAGNOSIS — H34832 Tributary (branch) retinal vein occlusion, left eye, with macular edema: Secondary | ICD-10-CM | POA: Diagnosis not present

## 2022-06-22 DIAGNOSIS — H01001 Unspecified blepharitis right upper eyelid: Secondary | ICD-10-CM | POA: Diagnosis not present

## 2022-06-22 DIAGNOSIS — H01002 Unspecified blepharitis right lower eyelid: Secondary | ICD-10-CM | POA: Diagnosis not present

## 2022-06-22 DIAGNOSIS — H01004 Unspecified blepharitis left upper eyelid: Secondary | ICD-10-CM | POA: Diagnosis not present

## 2022-07-01 ENCOUNTER — Other Ambulatory Visit: Payer: Self-pay | Admitting: Family Medicine

## 2022-07-24 ENCOUNTER — Other Ambulatory Visit: Payer: Self-pay | Admitting: Family Medicine

## 2022-07-28 ENCOUNTER — Other Ambulatory Visit: Payer: Self-pay | Admitting: Family Medicine

## 2022-08-30 ENCOUNTER — Other Ambulatory Visit: Payer: Self-pay | Admitting: Family Medicine

## 2022-09-17 ENCOUNTER — Ambulatory Visit: Payer: Medicare PPO | Admitting: Orthopedic Surgery

## 2022-09-21 DIAGNOSIS — H01001 Unspecified blepharitis right upper eyelid: Secondary | ICD-10-CM | POA: Diagnosis not present

## 2022-09-21 DIAGNOSIS — H01002 Unspecified blepharitis right lower eyelid: Secondary | ICD-10-CM | POA: Diagnosis not present

## 2022-09-21 DIAGNOSIS — H34832 Tributary (branch) retinal vein occlusion, left eye, with macular edema: Secondary | ICD-10-CM | POA: Diagnosis not present

## 2022-09-21 DIAGNOSIS — H01004 Unspecified blepharitis left upper eyelid: Secondary | ICD-10-CM | POA: Diagnosis not present

## 2022-10-01 ENCOUNTER — Other Ambulatory Visit: Payer: Self-pay | Admitting: Family Medicine

## 2022-10-08 ENCOUNTER — Ambulatory Visit (INDEPENDENT_AMBULATORY_CARE_PROVIDER_SITE_OTHER): Payer: Medicare PPO

## 2022-10-08 ENCOUNTER — Telehealth: Payer: Self-pay | Admitting: Family Medicine

## 2022-10-08 VITALS — Ht 75.0 in | Wt 190.0 lb

## 2022-10-08 DIAGNOSIS — Z0000 Encounter for general adult medical examination without abnormal findings: Secondary | ICD-10-CM

## 2022-10-08 NOTE — Telephone Encounter (Signed)
Prescription Request  10/08/2022  LOV: 04/23/2022  What is the name of the medication or equipment? FLUoxetine (PROZAC) 20 MG capsule [[id number]]  Pt states that he has been taking 2 tabs in stead wants to have increased   Have you contacted your pharmacy to request a refill? No   Which pharmacy would you like this sent to?  [facility] - [place], [place] - [address] [place] [address] Phone: [phone] Fax: [phone]    Patient notified that their request is being sent to the clinical staff for review and that they should receive a response within 2 business days.   Please advise at Mobile [phone] (mobile)

## 2022-10-08 NOTE — Progress Notes (Signed)
 Because this visit was a virtual/telehealth visit,  certain criteria was not obtained, such a blood pressure, CBG if applicable, and timed get up and go. Any medications not marked as "taking" were not mentioned during the medication reconciliation part of the visit. Any vitals not documented were not able to be obtained due to this being a telehealth visit or patient was unable to self-report a recent blood pressure reading due to a lack of equipment at home via telehealth. Vitals that have been documented are verbally provided by the patient.   Subjective:   Matthew Adkins is a 72 y.o. male who presents for Medicare Annual/Subsequent preventive examination.  Visit Complete: Virtual  I connected with  Quillian Quince on 10/08/22 by a audio enabled telemedicine application and verified that I am speaking with the correct person using two identifiers.  Patient Location: Home  Provider Location: Home Office  I discussed the limitations of evaluation and management by telemedicine. The patient expressed understanding and agreed to proceed.  Patient Medicare AWV questionnaire was completed by the patient on na; I have confirmed that all information answered by patient is correct and no changes since this date.  Cardiac Risk Factors include: advanced age (>86men, >14 women);hypertension;male gender;smoking/ tobacco exposure;sedentary lifestyle     Objective:    Today's Vitals   10/08/22 1001  Weight: 190 lb (86.2 kg)  Height: 6\' 3"  (1.905 m)   Body mass index is 23.75 kg/m.     10/08/2022   10:04 AM 04/03/2021    9:05 AM 02/20/2021    1:08 PM 02/13/2021    4:09 PM 04/26/2020    8:38 AM 03/22/2020    8:32 AM 03/01/2020   11:55 AM  Advanced Directives  Does Patient Have a Medical Advance Directive? No No Yes No No No No  Type of Advance Directive   Healthcare Power of Attorney      Does patient want to make changes to medical advance directive?   No - Patient declined      Would  patient like information on creating a medical advance directive? No - Patient declined Yes (ED - Information included in AVS) No - Patient declined No - Patient declined No - Patient declined No - Patient declined No - Patient declined    Current Medications (verified) Outpatient Encounter Medications as of 10/08/2022  Medication Sig   amLODipine (NORVASC) 10 MG tablet TAKE 1 TABLET BY MOUTH ONCE DAILY.   buPROPion (WELLBUTRIN SR) 150 MG 12 hr tablet Take 1 tablet (150 mg total) by mouth 2 (two) times daily.   Carboxymethylcellul-Glycerin (LUBRICATING EYE DROPS OP) Place 1 drop into both eyes daily as needed (dry eyes).   FLUoxetine (PROZAC) 20 MG capsule TAKE 1 CAPSULE BY MOUTH DAILY.   HYDROcodone-acetaminophen (NORCO) 7.5-325 MG tablet Take 1 tablet by mouth every 6 (six) hours as needed for moderate pain.   montelukast (SINGULAIR) 10 MG tablet TAKE 1 TABLET BY MOUTH ONCE AT BEDTIME.   Multiple Vitamins-Minerals (ONE DAILY MENS 50+ MULTIVIT PO) Take 1 tablet by mouth daily.   omeprazole (PRILOSEC) 20 MG capsule TAKE 1 CAPSULE BY MOUTH 30 MINUTES PRIOR TO BREAKFAST.   polyethylene glycol (MIRALAX) 17 g packet Take 17 g by mouth daily. (Patient taking differently: Take 17 g by mouth daily as needed for mild constipation.)   simethicone (MYLICON) 80 MG chewable tablet TAKE (1) TABLET BY MOUTH ONCE DAILY.   valsartan (DIOVAN) 40 MG tablet TAKE ONE TABLET BY MOUTH ONCE DAILY.  No facility-administered encounter medications on file as of 10/08/2022.    Allergies (verified) Bee venom, Codeine, and Lime oil   History: Past Medical History:  Diagnosis Date   Arthritis    Bladder cancer (HCC) 2000   Depression    Essential hypertension 01/16/2018   Nicotine addiction    Prostate cancer (HCC) 07/06/2014   Seasonal allergies    Past Surgical History:  Procedure Laterality Date   athroscopy of right knee  2002   cancerous polyps removed from bladder  2000   CATARACT EXTRACTION W/PHACO  Left 03/01/2020   Procedure: CATARACT EXTRACTION PHACO AND INTRAOCULAR LENS PLACEMENT (IOC);  Surgeon: Fabio Pierce, MD;  Location: [facility];  Service: Ophthalmology;  Laterality: Left;  CDE  4.96   CATARACT EXTRACTION W/PHACO Right 03/22/2020   Procedure: CATARACT EXTRACTION PHACO AND INTRAOCULAR LENS PLACEMENT RIGHT EYE;  Surgeon: Fabio Pierce, MD;  Location: [facility];  Service: Ophthalmology;  Laterality: Right;  CDE 5.78   COLONOSCOPY  MJ 2009 pmhX: POLYPS   POLYP REMOVED BUT NOT RETRIEVED   COLONOSCOPY  LS 2007 ARS   ? POLYPS, no path available   COLONOSCOPY  09/26/2010   sessile poylp(2) pan-colonic diverticulosis,mild/internal hemorrhoids   COLONOSCOPY WITH PROPOFOL N/A 02/09/2017   Procedure: COLONOSCOPY WITH PROPOFOL;  Surgeon: West Bali, MD;  Location: [facility];  Service: Endoscopy;  Laterality: N/A;  8:30am   COLONOSCOPY WITH PROPOFOL N/A 04/26/2020   Procedure: COLONOSCOPY WITH PROPOFOL;  Surgeon: Lanelle Bal, DO;  [facility]: [facility];  Service: Endoscopy;  Laterality: N/A;  AM   ESOPHAGOGASTRODUODENOSCOPY (EGD) WITH PROPOFOL N/A 02/09/2017   Procedure: ESOPHAGOGASTRODUODENOSCOPY (EGD) WITH PROPOFOL;  Surgeon: West Bali, MD;  [facility]: [facility];  Service: Endoscopy;  Laterality: N/A;   FOOT SURGERY Bilateral 2004   HERNIA REPAIR  1980's   left inguinal hernia   INGUINAL HERNIA REPAIR Right 02/21/2021   Procedure: HERNIA REPAIR INGUINAL ADULT w/ MESH;  Surgeon: Lucretia Roers, MD;  Location: [facility];  Service: General;  Laterality: Right;   LYMPHADENECTOMY Bilateral 09/03/2014   Procedure: PELVIC LYMPHADENECTOMY;  Surgeon: Heloise Purpura, MD;  Location: [facility];  Service: Urology;  Laterality: Bilateral;   POLYPECTOMY  02/09/2017   Procedure: POLYPECTOMY;  Surgeon: West Bali, MD;  [facility];  Service: Endoscopy;;  cecal polyp, hepatic flexure polyps x3, transverse colon polyp, descending colon polyps x2, rectal polyps x2   POLYPECTOMY   04/26/2020   Procedure: POLYPECTOMY;  Surgeon: Lanelle Bal, DO;  [facility];  Service: Endoscopy;;   PROSTATE BIOPSY  07/06/14   ROBOT ASSISTED LAPAROSCOPIC RADICAL PROSTATECTOMY N/A 09/03/2014   Procedure: ROBOTIC ASSISTED LAPAROSCOPIC RADICAL PROSTATECTOMY LEVEL 2;  Surgeon: Heloise Purpura, MD;  Location: [facility];  Service: Urology;  Laterality: N/A;   Family History  Problem Relation Age of Onset   Heart attack Father    Diabetes Sister    Hypertension Sister    Hypertension Sister    Aneurysm Sister        brain    Arthritis Other        family history    Colon cancer Neg Hx    Colon polyps Neg Hx    Social History   Socioeconomic History   Marital status: Married    Spouse name: Not on file   Number of children: 2   Years of education: Not on file   Highest education level: Not on file  Occupational History  Occupation: unemployed   Tobacco Use   Smoking status: Every Day    Current packs/day: 0.25    Average packs/day: 0.3 packs/day for 30.0 years (7.5 ttl pk-yrs)    Types: Cigarettes   Smokeless tobacco: Never  Vaping Use   Vaping status: Never Used  Substance and Sexual Activity   Alcohol use: Yes    Comment: 3 pints/month   Drug use: Yes    Types: Marijuana    Comment: occassionally   Sexual activity: Yes    Birth control/protection: None  Other Topics Concern   Not on file  Social History Narrative   Not on file   Social Determinants of Health   Financial Resource Strain: Low Risk  (10/08/2022)   Overall Financial Resource Strain (CARDIA)    Difficulty of Paying Living Expenses: Not very hard  Food Insecurity: No Food Insecurity (10/08/2022)   Hunger Vital Sign    Worried About Running Out of Food in the Last Year: Never true    Ran Out of Food in the Last Year: Never true  Transportation Needs: No Transportation Needs (10/08/2022)   PRAPARE - Administrator, Civil Service (Medical): No    Lack of Transportation  (Non-Medical): No  Physical Activity: Inactive (10/08/2022)   Exercise Vital Sign    Days of Exercise per Week: 0 days    Minutes of Exercise per Session: 0 min  Stress: No Stress Concern Present (10/08/2022)   [employer] of Occupational Health - Occupational Stress Questionnaire    Feeling of Stress : Only a little  Social Connections: Socially Integrated (10/08/2022)   Social Connection and Isolation Panel [NHANES]    Frequency of Communication with Friends and Family: More than three times a week    Frequency of Social Gatherings with Friends and Family: More than three times a week    Attends Religious Services: More than 4 times per year    Active Member of [employer] or Organizations: Yes    Attends Engineer, structural: More than 4 times per year    Marital Status: Married    Tobacco Counseling Ready to quit: Yes Counseling given: Yes   Clinical Intake:  Pre-visit preparation completed: Yes  Pain : No/denies pain     BMI - recorded: 23.75 Nutritional Status: BMI of 19-24  Normal Nutritional Risks: None Diabetes: No  How often do you need to have someone help you when you read instructions, pamphlets, or other written materials from your doctor or pharmacy?: 1 - Never  Interpreter Needed?: No  Information entered by ::  Diarra Ceja, CMA   Activities of Daily Living    10/08/2022   10:03 AM  In your present state of health, do you have any difficulty performing the following activities:  Hearing? 0  Vision? 0  Difficulty concentrating or making decisions? 0  Walking or climbing stairs? 0  Dressing or bathing? 0  Doing errands, shopping? 0  Preparing Food and eating ? N  Using the Toilet? N  In the past six months, have you accidently leaked urine? N  Do you have problems with loss of bowel control? N  Managing your Medications? N  Managing your Finances? N  Housekeeping or managing your Housekeeping? N    Patient Care Team: Kerri Perches, MD as PCP - General [facility], Dorothe Pea, MD as PCP - Cardiology (Cardiology) West Bali, MD (Inactive) (Gastroenterology)  Indicate any recent Medical Services you may have received from other than [facility]  providers in the past year (date may be approximate).     Assessment:   This is a routine wellness examination for Damier.  Hearing/Vision screen Hearing Screening - Comments:: Patient denies any hearing difficulties.   Vision Screening - Comments:: Wears rx glasses - up to date with routine eye exams     Goals Addressed             This Visit's Progress    Patient Stated       Increase physical activity       Depression Screen    10/08/2022   10:06 AM 04/23/2022    3:54 PM 11/11/2021    4:23 PM 10/14/2021    3:03 PM 07/23/2021   11:29 AM 04/03/2021    9:09 AM 02/12/2021   11:42 AM  PHQ 2/9 Scores  PHQ - 2 Score 2 0 2 0 0 0 0  PHQ- 9 Score 3  5        Fall Risk    10/08/2022   10:04 AM 04/23/2022    3:54 PM 11/11/2021    4:23 PM 10/14/2021    3:03 PM 07/23/2021   11:29 AM  Fall Risk   Falls in the past year? 1 1 1  0 0  Number falls in past yr: 0 0 0 0 0  Injury with Fall? 0 1 1 0 0  Risk for fall due to : History of fall(s)  History of fall(s) No Fall Risks No Fall Risks  Follow up Falls prevention discussed;Education provided  Falls evaluation completed Falls evaluation completed Falls evaluation completed    MEDICARE RISK AT HOME: Medicare Risk at Home Any stairs in or around the home?: No If so, are there any without handrails?: No Home free of loose throw rugs in walkways, pet beds, electrical cords, etc?: Yes Adequate lighting in your home to reduce risk of falls?: Yes Life alert?: No Use of a cane, walker or w/c?: No Grab bars in the bathroom?: Yes Shower chair or bench in shower?: Yes Elevated toilet seat or a handicapped toilet?: Yes  TIMED UP AND GO:  Was the test performed?  No    Cognitive Function:        10/08/2022   10:06 AM  04/03/2021    9:11 AM 03/27/2020    3:07 PM 03/27/2019   10:20 AM 07/19/2018    1:45 PM  6CIT Screen  What Year? 0 points 0 points 0 points 0 points 0 points  What month? 0 points 0 points 0 points 0 points 0 points  What time? 0 points 0 points 0 points 0 points 0 points  Count back from 20 0 points 0 points 0 points 0 points 0 points  Months in reverse 0 points 0 points 0 points 0 points 0 points  Repeat phrase 0 points 0 points 0 points 0 points 0 points  Total Score 0 points 0 points 0 points 0 points 0 points    Immunizations Immunization History  Administered Date(s) Administered   Fluad Quad(high Dose 65+) 10/03/2018, 10/03/2020, 10/14/2021   Influenza Split 09/20/2010, 10/06/2011   Influenza Whole 10/02/2008, 09/19/2009   Influenza, High Dose Seasonal PF 11/05/2017, 11/07/2019   Influenza,inj,Quad PF,6+ Mos 09/19/2014, 10/23/2015, 10/07/2016   Moderna SARS-COV2 Booster Vaccination 11/23/2019   Moderna Sars-Covid-2 Vaccination 02/12/2019, 03/15/2019   Pneumococcal Conjugate-13 05/30/2014   Pneumococcal Polysaccharide-23 10/23/2015   Td 10/21/2009   Zoster Recombinant(Shingrix) 04/25/2022, 08/25/2022   Zoster, Live 06/25/2010    TDAP status: Due,  Education has been provided regarding the importance of this vaccine. Advised may receive this vaccine at local pharmacy or Health Dept. Aware to provide a copy of the vaccination record if obtained from local pharmacy or Health Dept. Verbalized acceptance and understanding.  Flu Vaccine status: Due, Education has been provided regarding the importance of this vaccine. Advised may receive this vaccine at local pharmacy or Health Dept. Aware to provide a copy of the vaccination record if obtained from local pharmacy or Health Dept. Verbalized acceptance and understanding.  Pneumococcal vaccine status: Completed during today's visit.  Covid-19 vaccine status: Information provided on how to obtain vaccines.   Qualifies for Shingles  Vaccine? No   Zostavax completed Yes   Shingrix Completed?: Yes  Screening Tests Health Maintenance  Topic Date Due   DTaP/Tdap/Td (2 - Tdap) 10/22/2019   COVID-19 Vaccine (3 - Moderna risk series) 12/21/2019   Medicare Annual Wellness (AWV)  04/04/2022   INFLUENZA VACCINE  08/06/2022   Colonoscopy  04/27/2030   Pneumonia Vaccine 10+ Years old  Completed   Hepatitis C Screening  Completed   Zoster Vaccines- Shingrix  Completed   HPV VACCINES  Aged Out    Health Maintenance  Health Maintenance Due  Topic Date Due   DTaP/Tdap/Td (2 - Tdap) 10/22/2019   COVID-19 Vaccine (3 - Moderna risk series) 12/21/2019   Medicare Annual Wellness (AWV)  04/04/2022   INFLUENZA VACCINE  08/06/2022    Colorectal cancer screening: Type of screening: Colonoscopy. Completed 04/26/2020. Repeat every 5 years  Lung Cancer Screening: (Low Dose CT Chest recommended if Age 55-80 years, 20 pack-year currently smoking OR have quit w/in 15years.) does not qualify.   Lung Cancer Screening Referral: na  Additional Screening:  Hepatitis C Screening: does not qualify; Completed 05/29/2015  Vision Screening: Recommended annual ophthalmology exams for early detection of glaucoma and other disorders of the eye. Is the patient up to date with their annual eye exam?  Yes  Who is the provider or what is the name of the office in which the patient attends annual eye exams? [facility] If pt is not established with a provider, would they like to be referred to a provider to establish care? No .   Dental Screening: Recommended annual dental exams for proper oral hygiene  Diabetic Foot Exam: na  Community Resource Referral / Chronic Care Management: CRR required this visit?  No   CCM required this visit?  No     Plan:     I have personally reviewed and noted the following in the patient's chart:   Medical and social history Use of alcohol, tobacco or illicit drugs  Current medications and  supplements including opioid prescriptions. Patient is not currently taking opioid prescriptions. Functional ability and status Nutritional status Physical activity Advanced directives List of other physicians Hospitalizations, surgeries, and ER visits in previous 12 months Vitals Screenings to include cognitive, depression, and falls Referrals and appointments  In addition, I have reviewed and discussed with patient certain preventive protocols, quality metrics, and best practice recommendations. A written personalized care plan for preventive services as well as general preventive health recommendations were provided to patient.     Jordan Hawks Sai Moura, CMA   10/08/2022   After Visit Summary: (Mail) Due to this being a telephonic visit, the after visit summary with patients personalized plan was offered to patient via mail   Nurse Notes:

## 2022-10-08 NOTE — Patient Instructions (Signed)
Matthew Adkins , Thank you for taking time to come for your Medicare Wellness Visit. I appreciate your ongoing commitment to your health goals. Please review the following plan we discussed and let me know if I can assist you in the future.   Referrals/Orders/Follow-Ups/Clinician Recommendations:  Next Medicare Annual Wellness Visit: October 11, 2023 at 1040am virtual visit  This is a list of the screening recommended for you and due dates:  Health Maintenance  Topic Date Due   DTaP/Tdap/Td vaccine (2 - Tdap) 10/22/2019   COVID-19 Vaccine (3 - Moderna risk series) 12/21/2019   Flu Shot  08/06/2022   Medicare Annual Wellness Visit  10/08/2023   Colon Cancer Screening  04/26/2025   Pneumonia Vaccine  Completed   Hepatitis C Screening  Completed   Zoster (Shingles) Vaccine  Completed   HPV Vaccine  Aged Out    Advanced directives: (Declined) Advance directive discussed with you today. Even though you declined this today, please call our office should you change your mind, and we can give you the proper paperwork for you to fill out. Advance Care Planning is important because it:  [x]  Makes sure you receive the medical care that is consistent with your values, goals, and preferences  [x]  It provides guidance to your family and loved ones and it also reduces their decisional burden about whether or not they are making the right decisions based on what you want done  Follow the link provided in your after visit summary or read over the paperwork we have mailed to you to help you started getting your Advance Directives in place. If you need assistance in completing these, please reach out to [place] so that we can help you!  Next Medicare Annual Wellness Visit scheduled for next year: Yes  Preventive Care 74 Years and Older, Male Preventive care refers to lifestyle choices and visits with your health care provider that can promote health and wellness. Preventive care visits are also called wellness  exams. What can I expect for my preventive care visit? Counseling During your preventive care visit, your health care provider may ask about your: Medical history, including: Past medical problems. Family medical history. History of falls. Current health, including: Emotional well-being. Home life and relationship well-being. Sexual activity. Memory and ability to understand (cognition). Lifestyle, including: Alcohol, nicotine or tobacco, and drug use. Access to firearms. Diet, exercise, and sleep habits. Work and work Astronomer. Sunscreen use. Safety issues such as seatbelt and bike helmet use. Physical exam Your health care provider will check your: Height and weight. These may be used to calculate your BMI (body mass index). BMI is a measurement that tells if you are at a healthy weight. Waist circumference. This measures the distance around your waistline. This measurement also tells if you are at a healthy weight and may help predict your risk of certain diseases, such as type 2 diabetes and high blood pressure. Heart rate and blood pressure. Body temperature. Skin for abnormal spots. What immunizations do I need?  Vaccines are usually given at various ages, according to a schedule. Your health care provider will recommend vaccines for you based on your age, medical history, and lifestyle or other factors, such as travel or where you work. What tests do I need? Screening Your health care provider may recommend screening tests for certain conditions. This may include: Lipid and cholesterol levels. Diabetes screening. This is done by checking your blood sugar (glucose) after you have not eaten for a while (fasting). Hepatitis  C test. Hepatitis B test. HIV (human immunodeficiency virus) test. STI (sexually transmitted infection) testing, if you are at risk. Lung cancer screening. Colorectal cancer screening. Prostate cancer screening. Abdominal aortic aneurysm (AAA)  screening. You may need this if you are a current or former smoker. Talk with your health care provider about your test results, treatment options, and if necessary, the need for more tests. Follow these instructions at home: Eating and drinking  Eat a diet that includes fresh fruits and vegetables, whole grains, lean protein, and low-fat dairy products. Limit your intake of foods with high amounts of sugar, saturated fats, and salt. Take vitamin and mineral supplements as recommended by your health care provider. Do not drink alcohol if your health care provider tells you not to drink. If you drink alcohol: Limit how much you have to 0-2 drinks a day. Know how much alcohol is in your drink. In the [place] one drink equals one 12 oz bottle of beer (355 mL), one 5 oz glass of wine (148 mL), or one 1 oz glass of hard liquor (44 mL). Lifestyle Brush your teeth every morning and night with fluoride toothpaste. Floss one time each day. Exercise for at least 30 minutes 5 or more days each week. Do not use any products that contain nicotine or tobacco. These products include cigarettes, chewing tobacco, and vaping devices, such as e-cigarettes. If you need help quitting, ask your health care provider. Do not use drugs. If you are sexually active, practice safe sex. Use a condom or other form of protection to prevent STIs. Take aspirin only as told by your health care provider. Make sure that you understand how much to take and what form to take. Work with your health care provider to find out whether it is safe and beneficial for you to take aspirin daily. Ask your health care provider if you need to take a cholesterol-lowering medicine (statin). Find healthy ways to manage stress, such as: Meditation, yoga, or listening to music. Journaling. Talking to a trusted person. Spending time with friends and family. Safety Always wear your seat belt while driving or riding in a vehicle. Do not drive: If  you have been drinking alcohol. Do not ride with someone who has been drinking. When you are tired or distracted. While texting. If you have been using any mind-altering substances or drugs. Wear a helmet and other protective equipment during sports activities. If you have firearms in your house, make sure you follow all gun safety procedures. Minimize exposure to UV radiation to reduce your risk of skin cancer. What's next? Visit your health care provider once a year for an annual wellness visit. Ask your health care provider how often you should have your eyes and teeth checked. Stay up to date on all vaccines. This information is not intended to replace advice given to you by your health care provider. Make sure you discuss any questions you have with your health care provider. Document Revised: 06/19/2020 Document Reviewed: 06/19/2020 Elsevier Patient Education  2024 [employer]. Understanding Your Risk for Falls Millions of people have serious injuries from falls each year. It is important to understand your risk of falling. Talk with your health care provider about your risk and what you can do to lower it. If you do have a serious fall, make sure to tell your provider. Falling once raises your risk of falling again. How can falls affect me? Serious injuries from falls are common. These include: Broken bones, such as  hip fractures. Head injuries, such as traumatic brain injuries (TBI) or concussions. A fear of falling can cause you to avoid activities and stay at home. This can make your muscles weaker and raise your risk for a fall. What can increase my risk? There are a number of risk factors that increase your risk for falling. The more risk factors you have, the higher your risk of falling. Serious injuries from a fall happen most often to people who are older than 74 years old. Teenagers and young adults ages 42-29 are also at higher risk. Common risk factors include: Weakness  in the lower body. Being generally weak or confused due to long-term (chronic) illness. Dizziness or balance problems. Poor vision. Medicines that cause dizziness or drowsiness. These may include: Medicines for your blood pressure, heart, anxiety, insomnia, or swelling (edema). Pain medicines. Muscle relaxants. Other risk factors include: Drinking alcohol. Having had a fall in the past. Having foot pain or wearing improper footwear. Working at a dangerous job. Having any of the following in your home: Tripping hazards, such as floor clutter or loose rugs. Poor lighting. Pets. Having dementia or memory loss. What actions can I take to lower my risk of falling?     Physical activity Stay physically fit. Do strength and balance exercises. Consider taking a regular class to build strength and balance. Yoga and tai chi are good options. Vision Have your eyes checked every year and your prescription for glasses or contacts updated as needed. Shoes and walking aids Wear non-skid shoes. Wear shoes that have rubber soles and low heels. Do not wear high heels. Do not walk around the house in socks or slippers. Use a cane or walker as told by your provider. Home safety Attach secure railings on both sides of your stairs. Install grab bars for your bathtub, shower, and toilet. Use a non-skid mat in your bathtub or shower. Attach bath mats securely with double-sided, non-slip rug tape. Use good lighting in all rooms. Keep a flashlight near your bed. Make sure there is a clear path from your bed to the bathroom. Use night-lights. Do not use throw rugs. Make sure all carpeting is taped or tacked down securely. Remove all clutter from walkways and stairways, including extension cords. Repair uneven or broken steps and floors. Avoid walking on icy or slippery surfaces. Walk on the grass instead of on icy or slick sidewalks. Use ice melter to get rid of ice on walkways in the winter. Use a  cordless phone. Questions to ask your health care provider Can you help me check my risk for a fall? Do any of my medicines make me more likely to fall? Should I take a vitamin D supplement? What exercises can I do to improve my strength and balance? Should I make an appointment to have my vision checked? Do I need a bone density test to check for weak bones (osteoporosis)? Would it help to use a cane or a walker? Where to find more information Centers for Disease Control and Prevention, STEADI: TonerPromos.no Community-Based Fall Prevention Programs: TonerPromos.no [employer] on Aging: BaseRingTones.pl Contact a health care provider if: You fall at home. You are afraid of falling at home. You feel weak, drowsy, or dizzy. This information is not intended to replace advice given to you by your health care provider. Make sure you discuss any questions you have with your health care provider. Document Revised: 08/25/2021 Document Reviewed: 08/25/2021 Elsevier Patient Education  2024 [employer].

## 2022-10-09 DIAGNOSIS — H43821 Vitreomacular adhesion, right eye: Secondary | ICD-10-CM | POA: Diagnosis not present

## 2022-10-09 DIAGNOSIS — H31092 Other chorioretinal scars, left eye: Secondary | ICD-10-CM | POA: Diagnosis not present

## 2022-10-09 DIAGNOSIS — H34812 Central retinal vein occlusion, left eye, with macular edema: Secondary | ICD-10-CM | POA: Diagnosis not present

## 2022-10-15 DIAGNOSIS — H34812 Central retinal vein occlusion, left eye, with macular edema: Secondary | ICD-10-CM | POA: Diagnosis not present

## 2022-10-21 ENCOUNTER — Encounter: Payer: Self-pay | Admitting: Family Medicine

## 2022-10-21 ENCOUNTER — Ambulatory Visit: Payer: Medicare PPO | Admitting: Family Medicine

## 2022-10-21 VITALS — BP 167/79 | HR 59 | Ht 75.0 in | Wt 189.0 lb

## 2022-10-21 DIAGNOSIS — I7143 Infrarenal abdominal aortic aneurysm, without rupture: Secondary | ICD-10-CM | POA: Diagnosis not present

## 2022-10-21 DIAGNOSIS — I10 Essential (primary) hypertension: Secondary | ICD-10-CM | POA: Diagnosis not present

## 2022-10-21 DIAGNOSIS — F324 Major depressive disorder, single episode, in partial remission: Secondary | ICD-10-CM

## 2022-10-21 DIAGNOSIS — C61 Malignant neoplasm of prostate: Secondary | ICD-10-CM | POA: Diagnosis not present

## 2022-10-21 DIAGNOSIS — F17200 Nicotine dependence, unspecified, uncomplicated: Secondary | ICD-10-CM | POA: Diagnosis not present

## 2022-10-21 DIAGNOSIS — Z0001 Encounter for general adult medical examination with abnormal findings: Secondary | ICD-10-CM | POA: Diagnosis not present

## 2022-10-21 DIAGNOSIS — E559 Vitamin D deficiency, unspecified: Secondary | ICD-10-CM | POA: Diagnosis not present

## 2022-10-21 DIAGNOSIS — R7303 Prediabetes: Secondary | ICD-10-CM | POA: Diagnosis not present

## 2022-10-21 DIAGNOSIS — E785 Hyperlipidemia, unspecified: Secondary | ICD-10-CM | POA: Diagnosis not present

## 2022-10-21 DIAGNOSIS — R058 Other specified cough: Secondary | ICD-10-CM | POA: Insufficient documentation

## 2022-10-21 DIAGNOSIS — Z125 Encounter for screening for malignant neoplasm of prostate: Secondary | ICD-10-CM | POA: Insufficient documentation

## 2022-10-21 DIAGNOSIS — Z23 Encounter for immunization: Secondary | ICD-10-CM | POA: Diagnosis not present

## 2022-10-21 MED ORDER — FLUOXETINE HCL 40 MG PO CAPS: 40.0000 mg | ORAL_CAPSULE | Freq: Every day | ORAL | 3 refills | Status: DC

## 2022-10-21 MED ORDER — BENZONATATE 100 MG PO CAPS: 100.0000 mg | ORAL_CAPSULE | Freq: Two times a day (BID) | ORAL | 0 refills | Status: DC | PRN

## 2022-10-21 MED ORDER — AZITHROMYCIN 250 MG PO TABS: ORAL_TABLET | ORAL | 0 refills | Status: AC

## 2022-10-21 NOTE — Assessment & Plan Note (Signed)
Continue increased dose of fluoxetine, no need for therapy, reports good support

## 2022-10-21 NOTE — Patient Instructions (Signed)
F/u in 4 to 6 weeks re evaluate blood pressure, call if you need me sooner  Flu vaccine today  Please keep appt for chest scan  Dose increase in fluoxetine to 40 mg daily  Labs today already ordered , nurse pls add PSA  Tessalon perle and Azithromuycin are prescribed for cough  Thanks for choosing [facility], we consider it a privelige to serve you.

## 2022-10-21 NOTE — Assessment & Plan Note (Signed)
Annual exam as documented. Counseling done  re healthy lifestyle involving commitment to 150 minutes exercise per week, heart healthy diet, and attaining healthy weight.The importance of adequate sleep also discussed.  Immunization and cancer screening needs are specifically addressed at this visit.

## 2022-10-21 NOTE — Assessment & Plan Note (Signed)
Asked:confirms currently smokes cigarettes approx 10/day Assess: Unwilling to set a quit date, stress reliever Advise: needs to QUIT to reduce risk of cancer, cardio and cerebrovascular disease Assist: counseled for 5 minutes and literature provided Arrange: follow up in 2 to 4 months

## 2022-10-21 NOTE — Progress Notes (Signed)
Matthew Adkins     MRN: [id number]      DOB: [birth year]  Chief Complaint  Patient presents with   Annual Exam    CPE cough congestion tried mucinex flu shot    HPI: Patient is in for annual physical exam. 3 week h/o chest congestion , mostly clear sputum, no fever , chills or constitutional symptoms.  Still smokes no quit date, encouraged to quit, chest scan scheduled for screenng Increased fluoxetine dose to 40 mg in recent times and states feels better onn this dose Forgot to take BP med today, stressed , had a lot going on Labs today will be reviewed Immunization is reviewed , and  updated .    PE; BP (!) 167/79 (BP Location: Left Arm, Patient Position: Sitting, Cuff Size: Large)   Pulse (!) 59   Ht 6\' 3"  (1.905 m)   Wt 189 lb (85.7 kg)   SpO2 97%   BMI 23.62 kg/m   Pleasant male, alert and oriented x 3, in no cardio-pulmonary distress. Afebrile. HEENT No facial trauma or asymetry. Sinuses non tender. EOMI External ears normal,  Neck: supple, no adenopathy,JVD or thyromegaly.No bruits.  Chest: Clear to ascultation bilaterally.Decreased air entry, few bibasilar crackles no wheezes. Non tender to palpation  Cardiovascular system; Heart sounds normal,  S1 and  S2 ,no S3.  No murmur, or thrill. Apical beat not displaced Peripheral pulses normal.  Abdomen: Soft, non tender,  Musculoskeletal exam: Full ROM of spine, hips , shoulders and knees. No deformity ,swelling or crepitus noted. No muscle wasting or atrophy.   Neurologic: Cranial nerves 2 to 12 intact. Power, tone ,sensation and reflexes normal throughout. No disturbance in gait. No tremor.  Skin: Intact, no ulceration, erythema , scaling or rash noted. Pigmentation normal throughout  Psych; Normal mood and affect. Judgement and concentration normal   Assessment & Plan:  Encounter for Medicare annual examination with abnormal findings Annual exam as documented. Counseling done  re healthy  lifestyle involving commitment to 150 minutes exercise per week, heart healthy diet, and attaining healthy weight.The importance of adequate sleep also discussed. . Immunization and cancer screening needs are specifically addressed at this visit.   Prostate cancer screening PSA checked  NICOTINE ADDICTION Asked:confirms currently smokes cigarettes approx 10/day Assess: Unwilling to set a quit date, stress reliever Advise: needs to QUIT to reduce risk of cancer, cardio and cerebrovascular disease Assist: counseled for 5 minutes and literature provided Arrange: follow up in 2 to 4 months   Cough productive of yellow sputum Smoker and advanced age, Z pack and tessalon perle  Essential hypertension Uncontrolled at  visit, misssed med tody Return in 4 to 6 weeks DASH diet and commitment to daily physical activity for a minimum of 30 minutes discussed and encouraged, as a part of hypertension management. The importance of attaining a healthy weight is also discussed.     10/21/2022    3:13 PM 10/21/2022    3:11 PM 10/08/2022   10:01 AM 04/23/2022    3:52 PM 02/25/2022   10:06 AM 01/13/2022    3:06 PM 12/09/2021    1:54 PM  BP/Weight  Systolic BP 167 146 -- 132 135 150 100  Diastolic BP 79 86 -- 77 79 85 52  Wt. (Lbs)  189 190 194 194.6  185  BMI  23.62 kg/m2 23.75 kg/m2 24.25 kg/m2 24.32 kg/m2  23.12 kg/m2       Immunization due After obtaining informed consent, the vaccine is  administered , with no adverse effect noted at the time of administration.   Depression, major, single episode, in partial remission (HCC) Continue increased dose of fluoxetine, no need for therapy, reports good support

## 2022-10-21 NOTE — Assessment & Plan Note (Signed)
PSA checked

## 2022-10-21 NOTE — Assessment & Plan Note (Signed)
After obtaining informed consent, the vaccine is  administered , with no adverse effect noted at the time of administration.  

## 2022-10-21 NOTE — Assessment & Plan Note (Signed)
Smoker and advanced age, Z pack and tessalon perle

## 2022-10-21 NOTE — Assessment & Plan Note (Signed)
Uncontrolled at  visit, misssed med tody Return in 4 to 6 weeks DASH diet and commitment to daily physical activity for a minimum of 30 minutes discussed and encouraged, as a part of hypertension management. The importance of attaining a healthy weight is also discussed.     10/21/2022    3:13 PM 10/21/2022    3:11 PM 10/08/2022   10:01 AM 04/23/2022    3:52 PM 02/25/2022   10:06 AM 01/13/2022    3:06 PM 12/09/2021    1:54 PM  BP/Weight  Systolic BP 167 146 -- 132 135 150 100  Diastolic BP 79 86 -- 77 79 85 52  Wt. (Lbs)  189 190 194 194.6  185  BMI  23.62 kg/m2 23.75 kg/m2 24.25 kg/m2 24.32 kg/m2  23.12 kg/m2

## 2022-10-22 DIAGNOSIS — E785 Hyperlipidemia, unspecified: Secondary | ICD-10-CM | POA: Diagnosis not present

## 2022-10-22 DIAGNOSIS — R7303 Prediabetes: Secondary | ICD-10-CM | POA: Diagnosis not present

## 2022-10-22 DIAGNOSIS — N182 Chronic kidney disease, stage 2 (mild): Secondary | ICD-10-CM | POA: Diagnosis not present

## 2022-10-22 DIAGNOSIS — K219 Gastro-esophageal reflux disease without esophagitis: Secondary | ICD-10-CM | POA: Diagnosis not present

## 2022-10-22 DIAGNOSIS — I2510 Atherosclerotic heart disease of native coronary artery without angina pectoris: Secondary | ICD-10-CM | POA: Diagnosis not present

## 2022-10-22 DIAGNOSIS — F325 Major depressive disorder, single episode, in full remission: Secondary | ICD-10-CM | POA: Diagnosis not present

## 2022-10-22 DIAGNOSIS — R32 Unspecified urinary incontinence: Secondary | ICD-10-CM | POA: Diagnosis not present

## 2022-10-22 DIAGNOSIS — F419 Anxiety disorder, unspecified: Secondary | ICD-10-CM | POA: Diagnosis not present

## 2022-10-22 DIAGNOSIS — N529 Male erectile dysfunction, unspecified: Secondary | ICD-10-CM | POA: Diagnosis not present

## 2022-10-22 LAB — CMP14+EGFR
ALT: 13 [IU]/L (ref 0–44)
AST: 15 [IU]/L (ref 0–40)
Albumin: 4.2 g/dL (ref 3.8–4.8)
Alkaline Phosphatase: 74 [IU]/L (ref 44–121)
BUN/Creatinine Ratio: 12 (ref 10–24)
BUN: 11 mg/dL (ref 8–27)
Bilirubin Total: 0.8 mg/dL (ref 0.0–1.2)
CO2: 24 mmol/L (ref 20–29)
Calcium: 9 mg/dL (ref 8.6–10.2)
Chloride: 103 mmol/L (ref 96–106)
Creatinine, Ser: 0.93 mg/dL (ref 0.76–1.27)
Globulin, Total: 2.4 g/dL (ref 1.5–4.5)
Glucose: 84 mg/dL (ref 70–99)
Potassium: 3.9 mmol/L (ref 3.5–5.2)
Sodium: 143 mmol/L (ref 134–144)
Total Protein: 6.6 g/dL (ref 6.0–8.5)
eGFR: 86 mL/min/{1.73_m2} (ref >59)

## 2022-10-22 LAB — LIPID PANEL
Chol/HDL Ratio: 2.5 {ratio} (ref 0.0–5.0)
Cholesterol, Total: 178 mg/dL (ref 100–199)
HDL: 72 mg/dL (ref >39)
LDL Chol Calc (NIH): 95 mg/dL (ref 0–99)
Triglycerides: 55 mg/dL (ref 0–149)
VLDL Cholesterol Cal: 11 mg/dL (ref 5–40)

## 2022-10-22 LAB — CBC
Hematocrit: 44.2 % (ref 37.5–51.0)
Hemoglobin: 14.8 g/dL (ref 13.0–17.7)
MCH: 31.2 pg (ref 26.6–33.0)
MCHC: 33.5 g/dL (ref 31.5–35.7)
MCV: 93 fL (ref 79–97)
Platelets: 230 10*3/uL (ref 150–450)
RBC: 4.74 x10E6/uL (ref 4.14–5.80)
RDW: 12.6 % (ref 11.6–15.4)
WBC: 5.6 10*3/uL (ref 3.4–10.8)

## 2022-10-22 LAB — VITAMIN D 25 HYDROXY (VIT D DEFICIENCY, FRACTURES): Vit D, 25-Hydroxy: 37.6 ng/mL (ref 30.0–100.0)

## 2022-10-22 LAB — HEMOGLOBIN A1C
Est. average glucose Bld gHb Est-mCnc: 134 mg/dL
Hgb A1c MFr Bld: 6.3 % — ABNORMAL HIGH (ref 4.8–5.6)

## 2022-10-22 LAB — TSH: TSH: 0.739 u[IU]/mL (ref 0.450–4.500)

## 2022-10-23 LAB — PSA: Prostate Specific Ag, Serum: <0.1 ng/mL (ref 0.0–4.0)

## 2022-10-29 ENCOUNTER — Other Ambulatory Visit: Payer: Self-pay | Admitting: Family Medicine

## 2022-11-09 ENCOUNTER — Ambulatory Visit (HOSPITAL_COMMUNITY): Admission: RE | Admit: 2022-11-09 | Payer: Medicare PPO | Source: Ambulatory Visit

## 2022-11-18 ENCOUNTER — Ambulatory Visit: Payer: Medicare PPO | Admitting: Urology

## 2022-11-26 ENCOUNTER — Ambulatory Visit: Payer: Medicare PPO | Admitting: Family Medicine

## 2022-11-27 ENCOUNTER — Other Ambulatory Visit: Payer: Self-pay | Admitting: Family Medicine

## 2022-12-09 ENCOUNTER — Encounter: Payer: Self-pay | Admitting: Family Medicine

## 2022-12-31 ENCOUNTER — Other Ambulatory Visit: Payer: Self-pay

## 2022-12-31 MED ORDER — VALSARTAN 40 MG PO TABS: 40.0000 mg | ORAL_TABLET | Freq: Every day | ORAL | 2 refills | Status: DC

## 2023-01-12 ENCOUNTER — Ambulatory Visit
Admission: EM | Admit: 2023-01-12 | Discharge: 2023-01-12 | Discharge status: Against Medical Advice | Payer: Medicare PPO

## 2023-01-12 ENCOUNTER — Other Ambulatory Visit: Payer: Self-pay

## 2023-01-12 NOTE — ED Triage Notes (Addendum)
 Pt reports headache x3 days. Pt reports hx of left sided dental pain and reports would like blood pressure checked today to see if tooth or elevated BP is the cause of headache.

## 2023-01-12 NOTE — ED Provider Notes (Signed)
 Patient left without being seen during triage, per RN stated that he just wanted his blood pressure checked   Particia Nearing, PA-C 01/12/23 1945

## 2023-01-12 NOTE — ED Notes (Signed)
 Pt left after vital signs obtained. Pt reported "I just wanted my bp checked." Discussed care plan with pt and pt reported "I don't need to be seen."   Pt alert and oriented. Steady gait. NAD noted. PA notified.

## 2023-01-28 ENCOUNTER — Other Ambulatory Visit: Payer: Self-pay | Admitting: Family Medicine

## 2023-02-19 ENCOUNTER — Other Ambulatory Visit: Payer: Self-pay | Admitting: Family Medicine

## 2023-02-22 ENCOUNTER — Telehealth: Payer: Self-pay

## 2023-02-22 DIAGNOSIS — K219 Gastro-esophageal reflux disease without esophagitis: Secondary | ICD-10-CM

## 2023-02-22 NOTE — Telephone Encounter (Signed)
Refill request was received via fax from [facility] for omeprazole dr 20 mg capsule, qty: 90. Pt was last seen on 02/25/22

## 2023-02-25 MED ORDER — OMEPRAZOLE 20 MG PO CPDR: DELAYED_RELEASE_CAPSULE | ORAL | 3 refills | Status: DC

## 2023-02-25 NOTE — Addendum Note (Signed)
Addended by: Tiffany Kocher on: 02/25/2023 05:58 AM   Modules accepted: Orders

## 2023-03-04 ENCOUNTER — Telehealth: Payer: Self-pay | Admitting: Orthopedic Surgery

## 2023-03-04 NOTE — Telephone Encounter (Signed)
Left message for him to call back.  

## 2023-03-04 NOTE — Telephone Encounter (Signed)
 Dr Romeo Apple has not seen him in a couple years Has not prescribed I will call him

## 2023-03-04 NOTE — Telephone Encounter (Signed)
 DR. Romeo Apple   Patient called wants a refill on his pain medicine   HYDROcodone-acetaminophen (NORCO/VICODIN) 5-325 MG per tablet    Pharmacy:  [facility]

## 2023-03-08 ENCOUNTER — Ambulatory Visit: Payer: Medicare PPO | Admitting: Orthopedic Surgery

## 2023-03-20 DIAGNOSIS — Z6823 Body mass index (BMI) 23.0-23.9, adult: Secondary | ICD-10-CM | POA: Diagnosis not present

## 2023-03-20 DIAGNOSIS — R030 Elevated blood-pressure reading, without diagnosis of hypertension: Secondary | ICD-10-CM | POA: Diagnosis not present

## 2023-03-20 DIAGNOSIS — J209 Acute bronchitis, unspecified: Secondary | ICD-10-CM | POA: Diagnosis not present

## 2023-03-24 ENCOUNTER — Encounter: Payer: Self-pay | Admitting: Cardiology

## 2023-03-25 ENCOUNTER — Ambulatory Visit: Admitting: Orthopedic Surgery

## 2023-04-19 ENCOUNTER — Telehealth: Payer: Self-pay | Admitting: Orthopedic Surgery

## 2023-04-19 NOTE — Telephone Encounter (Signed)
 Returned the pt's call from the weekend, lvm for him to call back.  He is wanting an appt w/Dr. Marvina Slough.

## 2023-04-27 ENCOUNTER — Other Ambulatory Visit: Payer: Self-pay | Admitting: Family Medicine

## 2023-05-03 ENCOUNTER — Encounter: Payer: Self-pay | Admitting: Orthopedic Surgery

## 2023-05-03 ENCOUNTER — Ambulatory Visit: Admitting: Orthopedic Surgery

## 2023-05-03 DIAGNOSIS — M170 Bilateral primary osteoarthritis of knee: Secondary | ICD-10-CM | POA: Diagnosis not present

## 2023-05-03 MED ORDER — METHYLPREDNISOLONE ACETATE 40 MG/ML IJ SUSP
40.0000 mg | Freq: Once | INTRAMUSCULAR | Status: AC
  Administered 2023-05-03: 40 mg via INTRA_ARTICULAR

## 2023-05-03 MED ORDER — HYDROCODONE-ACETAMINOPHEN 10-325 MG PO TABS: 1.0000 | ORAL_TABLET | Freq: Three times a day (TID) | ORAL | 0 refills | Status: DC | PRN

## 2023-05-03 NOTE — Addendum Note (Signed)
 Addended by: Darrin Emerald on: 05/03/2023 03:25 PM   Modules accepted: Orders

## 2023-05-03 NOTE — Progress Notes (Addendum)
   There were no vitals taken for this visit.  There is no height or weight on file to calculate BMI.  Chief Complaint  Patient presents with   Knee Pain    Bilateral     Encounter Diagnosis  Name Primary?   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  Mild medial joint line pain  No effusion  Flexion is up to 115 degrees  Both knees similar  Recommend injection bilaterally  Hydrocodone  is okay but I recommend against oxycodone   We agreed to inject both knees  Note his wife is become total care she has dementia or at least does not really know where she is and he has to lifter on and off the toilet put her in and out of the car  Procedure note for bilateral knee injections  Procedure note left knee injection verbal consent was obtained to inject left knee joint  Timeout was completed to confirm the site of injection  The medications used were 40 mg depomedrol and 3 cc of 1% lidocaine   Anesthesia was provided by ethyl chloride and the skin was prepped with alcohol.  After cleaning the skin with alcohol a 20-gauge needle was used to inject the left knee joint. There were no complications. A sterile bandage was applied.   Procedure note right knee injection verbal consent was obtained to inject right knee joint  Timeout was completed to confirm the site of injection  The medications used were 40 mg depomedrol and 3 cc of 1% lidocaine   Anesthesia was provided by ethyl chloride and the skin was prepped with alcohol.  After cleaning the skin with alcohol a 20-gauge  needle was used to inject the right knee joint.  Patient had a vagal reaction  Only half of the medication injected into the right knee  Patient given a cool cloth he was placed supine  He got some food and water  Feels better  44-month follow-up x-ray both knees  He has had some issues with the hydrocodone  as we stated it made him feel funny but when discussing Tylenol  with codeine versus oxycodone  versus hydrocodone  we decided to try 10 mg  He is not in a position to have surgery right now because his wife is in such bad condition   Meds ordered this encounter  Medications   methylPREDNISolone  acetate (DEPO-MEDROL ) injection 40 mg   methylPREDNISolone  acetate (DEPO-MEDROL ) injection 40 mg  .medsthj Meds ordered this encounter  Medications   methylPREDNISolone  acetate (DEPO-MEDROL ) injection 40 mg   methylPREDNISolone  acetate (DEPO-MEDROL ) injection 40 mg   HYDROcodone -acetaminophen  (NORCO) 10-325 MG tablet    Sig: Take 1 tablet by mouth every 8 (eight) hours as needed.    Dispense:  30 tablet    Refill:  0

## 2023-05-03 NOTE — Progress Notes (Signed)
   There were no vitals taken for this visit.  There is no height or weight on file to calculate BMI.  Chief Complaint  Patient presents with   Knee Pain    Bilateral     No diagnosis found.  DOI/DOS/ Date: pain comes and goes states ok today  Unchanged

## 2023-05-06 ENCOUNTER — Telehealth: Payer: Self-pay | Admitting: Radiology

## 2023-05-06 NOTE — Telephone Encounter (Signed)
 Completed prior auth for hydrocodone  on cover my meds it was approved

## 2023-05-19 DIAGNOSIS — Z20828 Contact with and (suspected) exposure to other viral communicable diseases: Secondary | ICD-10-CM | POA: Diagnosis not present

## 2023-06-03 ENCOUNTER — Other Ambulatory Visit: Payer: Self-pay | Admitting: Orthopedic Surgery

## 2023-06-03 DIAGNOSIS — M170 Bilateral primary osteoarthritis of knee: Secondary | ICD-10-CM

## 2023-06-08 ENCOUNTER — Ambulatory Visit: Admitting: Family Medicine

## 2023-06-08 ENCOUNTER — Encounter: Payer: Self-pay | Admitting: Family Medicine

## 2023-06-08 VITALS — BP 135/70 | HR 66 | Resp 18 | Ht 75.0 in | Wt 182.0 lb

## 2023-06-08 DIAGNOSIS — I10 Essential (primary) hypertension: Secondary | ICD-10-CM

## 2023-06-08 DIAGNOSIS — Z122 Encounter for screening for malignant neoplasm of respiratory organs: Secondary | ICD-10-CM

## 2023-06-08 DIAGNOSIS — M170 Bilateral primary osteoarthritis of knee: Secondary | ICD-10-CM

## 2023-06-08 DIAGNOSIS — F17210 Nicotine dependence, cigarettes, uncomplicated: Secondary | ICD-10-CM | POA: Diagnosis not present

## 2023-06-08 DIAGNOSIS — R7302 Impaired glucose tolerance (oral): Secondary | ICD-10-CM | POA: Insufficient documentation

## 2023-06-08 NOTE — Assessment & Plan Note (Signed)
 Controlled, no change in medication DASH diet and commitment to daily physical activity for a minimum of 30 minutes discussed and encouraged, as a part of hypertension management. The importance of attaining a healthy weight is also discussed.     06/08/2023    3:50 PM 01/12/2023    7:39 PM 10/21/2022    3:13 PM 10/21/2022    3:11 PM 10/08/2022   10:01 AM 04/23/2022    3:52 PM 02/25/2022   10:06 AM  BP/Weight  Systolic BP 135 147 167 146 -- 132 135  Diastolic BP 70 69 79 86 -- 77 79  Wt. (Lbs) 182.04   189 190 194 194.6  BMI 22.75 kg/m2   23.62 kg/m2 23.75 kg/m2 24.25 kg/m2 24.32 kg/m2

## 2023-06-08 NOTE — Assessment & Plan Note (Signed)
 Patient educated about the importance of limiting  Carbohydrate intake , the need to commit to daily physical activity for a minimum of 30 minutes , and to commit weight loss. The fact that changes in all these areas will reduce or eliminate all together the development of diabetes is stressed.      Latest Ref Rng & Units 10/21/2022    4:15 PM 10/27/2021   10:34 PM 10/14/2021    4:05 PM 06/11/2021    3:38 PM 02/13/2021    5:03 PM  Diabetic Labs  HbA1c 4.8 - 5.6 % 6.3   6.1  6.0    Chol 100 - 199 mg/dL 829    562    HDL >13 mg/dL 72    63    Calc LDL 0 - 99 mg/dL 95    93    Triglycerides 0 - 149 mg/dL 55    55    Creatinine 0.76 - 1.27 mg/dL 0.86  5.78  4.69  6.29  0.96       06/08/2023    3:50 PM 01/12/2023    7:39 PM 10/21/2022    3:13 PM 10/21/2022    3:11 PM 10/08/2022   10:01 AM 04/23/2022    3:52 PM 02/25/2022   10:06 AM  BP/Weight  Systolic BP 135 147 167 146 -- 132 135  Diastolic BP 70 69 79 86 -- 77 79  Wt. (Lbs) 182.04   189 190 194 194.6  BMI 22.75 kg/m2   23.62 kg/m2 23.75 kg/m2 24.25 kg/m2 24.32 kg/m2       No data to display

## 2023-06-08 NOTE — Assessment & Plan Note (Signed)
 Managed by Ortho

## 2023-06-08 NOTE — Patient Instructions (Signed)
 Annual exam in October, call if t you need me sooner  TdAp past due plsget at pharmacy asap  Pls get chest scan for screening  Labs today  Work on cutting back smoking  .Thanks for choosing [facility], we consider it a privelige to serve you.

## 2023-06-08 NOTE — Progress Notes (Signed)
 Matthew Adkins     MRN: [id number]      DOB: 19-Apr-1950  Chief Complaint  Patient presents with   Hypertension    Follow up    HPI Matthew Adkins is here for follow up and re-evaluation of chronic medical conditions, medication management and review of any available recent lab and radiology data.  Preventive health is updated, specifically  Cancer screening and Immunization.   Questions or concerns regarding consultations or procedures which the PT has had in the interim are  addressed. The PT denies any adverse reactions to current medications since the last visit.  C/o increased stress caring for his wife with severe dementia and very little help C/o light headedness at times ROS Denies recent fever or chills. Denies sinus pressure, nasal congestion, ear pain or sore throat. Denies chest congestion, productive cough or wheezing. Denies chest pains, palpitations and leg swelling Denies abdominal pain, nausea, vomiting,diarrhea or constipation.   Denies dysuria, frequency, hesitancy or incontinence. Chronic kn ee pain  limitation in mobility.managed by Ortho Denies headaches, seizures,  C/o intermittent numbness and tingling in the hands day and night, right worse than left , no weakness noted. Denies uncontrolled  depression, anxiety or insomnia. Denies skin break down or rash.   PE  BP 135/70   Pulse 66   Resp 18   Ht 6\' 3"  (1.905 m)   Wt 182 lb 0.6 oz (82.6 kg)   SpO2 96%   BMI 22.75 kg/m   Patient alert and oriented and in no cardiopulmonary distress.  HEENT: No facial asymmetry, EOMI,     Neck supple .  Chest: Clear to auscultation bilaterally.  CVS: S1, S2 no murmurs, no S3.Regular rate.  ABD: Soft non tender.   Ext: No edema  MS: Adequate ROM spine, shoulders, hips and knees.  Skin: Intact, no ulcerations or rash noted.  Psych: Good eye contact, normal affect. Memory intact not anxious or depressed appearing.  CNS: CN 2-12 intact, power,  normal  throughout.no focal deficits noted.   Assessment & Plan  Essential hypertension Controlled, no change in medication DASH diet and commitment to daily physical activity for a minimum of 30 minutes discussed and encouraged, as a part of hypertension management. The importance of attaining a healthy weight is also discussed.     06/08/2023    3:50 PM 01/12/2023    7:39 PM 10/21/2022    3:13 PM 10/21/2022    3:11 PM 10/08/2022   10:01 AM 04/23/2022    3:52 PM 02/25/2022   10:06 AM  BP/Weight  Systolic BP 135 147 167 146 -- 132 135  Diastolic BP 70 69 79 86 -- 77 79  Wt. (Lbs) 182.04   189 190 194 194.6  BMI 22.75 kg/m2   23.62 kg/m2 23.75 kg/m2 24.25 kg/m2 24.32 kg/m2       IGT (impaired glucose tolerance) Patient educated about the importance of limiting  Carbohydrate intake , the need to commit to daily physical activity for a minimum of 30 minutes , and to commit weight loss. The fact that changes in all these areas will reduce or eliminate all together the development of diabetes is stressed.      Latest Ref Rng & Units 10/21/2022    4:15 PM 10/27/2021   10:34 PM 10/14/2021    4:05 PM 06/11/2021    3:38 PM 02/13/2021    5:03 PM  Diabetic Labs  HbA1c 4.8 - 5.6 % 6.3   6.1  6.0  Chol 100 - 199 mg/dL 811    914    HDL >78 mg/dL 72    63    Calc LDL 0 - 99 mg/dL 95    93    Triglycerides 0 - 149 mg/dL 55    55    Creatinine 0.76 - 1.27 mg/dL 2.95  6.21  3.08  6.57  0.96       06/08/2023    3:50 PM 01/12/2023    7:39 PM 10/21/2022    3:13 PM 10/21/2022    3:11 PM 10/08/2022   10:01 AM 04/23/2022    3:52 PM 02/25/2022   10:06 AM  BP/Weight  Systolic BP 135 147 167 146 -- 132 135  Diastolic BP 70 69 79 86 -- 77 79  Wt. (Lbs) 182.04   189 190 194 194.6  BMI 22.75 kg/m2   23.62 kg/m2 23.75 kg/m2 24.25 kg/m2 24.32 kg/m2       No data to display            Cigarette smoker Asked:confirms currently smokes cigarettes 1PPD Assess: Unwilling to set a quit date, but is  cutting back Advise: needs to QUIT to reduce risk of cancer, cardio and cerebrovascular disease Assist: counseled for 5 minutes and literature provided Arrange: follow up in 2 to 4 months   Arthritis of knee, degenerative Managed by Ortho

## 2023-06-08 NOTE — Assessment & Plan Note (Signed)
Asked:confirms currently smokes cigarettes 1 PPD Assess: Unwilling to set a quit date, but is cutting back Advise: needs to QUIT to reduce risk of cancer, cardio and cerebrovascular disease Assist: counseled for 5 minutes and literature provided Arrange: follow up in 2 to 4 months  

## 2023-06-09 ENCOUNTER — Ambulatory Visit: Payer: Self-pay | Admitting: Family Medicine

## 2023-06-09 LAB — CMP14+EGFR
ALT: 14 IU/L (ref 0–44)
AST: 16 IU/L (ref 0–40)
Albumin: 4.1 g/dL (ref 3.8–4.8)
Alkaline Phosphatase: 70 IU/L (ref 44–121)
BUN/Creatinine Ratio: 13 (ref 10–24)
BUN: 12 mg/dL (ref 8–27)
Bilirubin Total: 0.5 mg/dL (ref 0.0–1.2)
CO2: 22 mmol/L (ref 20–29)
Calcium: 8.9 mg/dL (ref 8.6–10.2)
Chloride: 106 mmol/L (ref 96–106)
Creatinine, Ser: 0.95 mg/dL (ref 0.76–1.27)
Globulin, Total: 2.6 g/dL (ref 1.5–4.5)
Glucose: 91 mg/dL (ref 70–99)
Potassium: 4 mmol/L (ref 3.5–5.2)
Sodium: 143 mmol/L (ref 134–144)
Total Protein: 6.7 g/dL (ref 6.0–8.5)
eGFR: 84 mL/min/{1.73_m2} (ref >59)

## 2023-06-09 LAB — HEMOGLOBIN A1C
Est. average glucose Bld gHb Est-mCnc: 126 mg/dL
Hgb A1c MFr Bld: 6 % — ABNORMAL HIGH (ref 4.8–5.6)

## 2023-06-15 ENCOUNTER — Other Ambulatory Visit: Payer: Self-pay

## 2023-06-15 DIAGNOSIS — Z87891 Personal history of nicotine dependence: Secondary | ICD-10-CM

## 2023-06-15 DIAGNOSIS — Z122 Encounter for screening for malignant neoplasm of respiratory organs: Secondary | ICD-10-CM

## 2023-06-15 DIAGNOSIS — F17210 Nicotine dependence, cigarettes, uncomplicated: Secondary | ICD-10-CM

## 2023-06-15 NOTE — Addendum Note (Signed)
 Addended by: Alberteen Huge E on: 06/15/2023 12:59 PM   Modules accepted: Orders

## 2023-07-05 ENCOUNTER — Other Ambulatory Visit: Payer: Self-pay | Admitting: Orthopedic Surgery

## 2023-07-05 DIAGNOSIS — M170 Bilateral primary osteoarthritis of knee: Secondary | ICD-10-CM

## 2023-07-08 ENCOUNTER — Telehealth: Payer: Self-pay | Admitting: Family Medicine

## 2023-07-08 ENCOUNTER — Other Ambulatory Visit: Payer: Self-pay

## 2023-07-08 ENCOUNTER — Encounter: Payer: Self-pay | Admitting: Acute Care

## 2023-07-08 MED ORDER — EPINEPHRINE 0.3 MG/0.3ML IJ SOAJ: 0.3000 mg | INTRAMUSCULAR | 3 refills | Status: AC | PRN

## 2023-07-08 NOTE — Telephone Encounter (Unsigned)
 Copied from CRM [id number]. Topic: Clinical - Medication Refill >> Jul 08, 2023  3:06 PM Tiffini S wrote: Medication: Epi Pen   Has the patient contacted their pharmacy? No (Agent: If no, request that the patient contact the pharmacy for the refill. If patient does not wish to contact the pharmacy document the reason why and proceed with request.) (Agent: If yes, when and what did the pharmacy advise?)  This is the patient's preferred pharmacy:  [facility] - [place], [place] - [address] [place] [address] Phone: [phone] Fax: [phone]  Is this the correct pharmacy for this prescription? Yes If no, delete pharmacy and type the correct one.   Has the prescription been filled recently? Yes  Is the patient out of the medication? Yes  Has the patient been seen for an appointment in the last year OR does the patient have an upcoming appointment? Yes  Can we respond through MyChart? No, patient is asking for phone call at [phone]  Agent: Please be advised that Rx refills may take up to 3 business days. We ask that you follow-up with your pharmacy.

## 2023-07-08 NOTE — Telephone Encounter (Signed)
 Sent!

## 2023-07-28 ENCOUNTER — Other Ambulatory Visit: Payer: Self-pay | Admitting: Family Medicine

## 2023-08-02 ENCOUNTER — Other Ambulatory Visit (INDEPENDENT_AMBULATORY_CARE_PROVIDER_SITE_OTHER): Payer: Self-pay

## 2023-08-02 ENCOUNTER — Other Ambulatory Visit: Payer: Self-pay

## 2023-08-02 ENCOUNTER — Ambulatory Visit: Admitting: Orthopedic Surgery

## 2023-08-02 DIAGNOSIS — M170 Bilateral primary osteoarthritis of knee: Secondary | ICD-10-CM

## 2023-08-02 DIAGNOSIS — F1190 Opioid use, unspecified, uncomplicated: Secondary | ICD-10-CM

## 2023-08-02 DIAGNOSIS — G8929 Other chronic pain: Secondary | ICD-10-CM | POA: Diagnosis not present

## 2023-08-02 MED ORDER — HYDROCODONE-ACETAMINOPHEN 10-325 MG PO TABS: 1.0000 | ORAL_TABLET | Freq: Three times a day (TID) | ORAL | 0 refills | Status: DC | PRN

## 2023-08-02 NOTE — Progress Notes (Signed)
   There were no vitals taken for this visit.  There is no height or weight on file to calculate BMI.  Chief Complaint  Patient presents with   Knee Pain    Bilateral     Encounter Diagnosis  Name Primary?   Primary osteoarthritis of both knees Yes    DOI/DOS/    Stable

## 2023-08-02 NOTE — Progress Notes (Signed)
 Encounter Diagnoses  Name Primary?   Primary osteoarthritis of both knees Yes   Opioid use    Encounter for chronic pain management       VISIT TYPE: FOLLOW UP   Chief Complaint  Patient presents with   Knee Pain    Bilateral     Encounter Diagnoses  Name Primary?   Primary osteoarthritis of both knees Yes   Opioid use    Encounter for chronic pain management     Assessment and Plan: Matthew Adkins comes in for follow-up he has and fairly stable condition with his knee pain managed with hydrocodone  10/08/2023 2 to 3 tablets/day  He is still having to take care of his wife who is full care and cannot walk on her own.  He cannot do knee replacements now so we have opted for opioid management for his knee pain after failing other treatment options  No changes really in his symptoms at this time  Return 90 days for chronic opioid management  There were no vitals taken for this visit.  Ortho Exam  Varus bilateral knees No assistive devices Slight flexion contractures in each knee Knee flexion arc up to 125 degrees     Imaging DG Knee AP/LAT W/Sunrise Left Result Date: 08/02/2023 X-ray report Chief complaint bilateral knee pain Images bilateral knee x-rays 3 views each Reading: Both knees are in varus just a mild amount less than 10 degrees both knees have severe narrowing of the medial compartment and the bone looks like it is touching medially.  There are osteophyte surrounding all 3 compartments.  All 3 compartments have disease but the medial compartment is worse.  The patellas are lined up and out normally. Impression severe osteoarthritis with moderate to severe amount of secondary bone changes.   DG Knee AP/LAT W/Sunrise Right Result Date: 08/02/2023 X-ray report Chief complaint bilateral knee pain Images bilateral knee x-rays 3 views each Reading: Both knees are in varus just a mild amount less than 10 degrees both knees have severe narrowing of the medial compartment and  the bone looks like it is touching medially.  There are osteophyte surrounding all 3 compartments.  All 3 compartments have disease but the medial compartment is worse.  The patellas are lined up and out normally. Impression severe osteoarthritis with moderate to severe amount of secondary bone changes.     A/P Encounter Diagnoses  Name Primary?   Primary osteoarthritis of both knees Yes   Opioid use    Encounter for chronic pain management     Meds ordered this encounter  Medications   HYDROcodone -acetaminophen  (NORCO) 10-325 MG tablet    Sig: Take 1 tablet by mouth every 8 (eight) hours as needed.    Dispense:  60 tablet    Refill:  0

## 2023-08-27 ENCOUNTER — Encounter: Payer: Self-pay | Admitting: Radiology

## 2023-09-02 ENCOUNTER — Telehealth: Payer: Self-pay | Admitting: Orthopedic Surgery

## 2023-09-02 ENCOUNTER — Other Ambulatory Visit: Payer: Self-pay | Admitting: Orthopedic Surgery

## 2023-09-02 DIAGNOSIS — M170 Bilateral primary osteoarthritis of knee: Secondary | ICD-10-CM

## 2023-09-02 MED ORDER — HYDROCODONE-ACETAMINOPHEN 10-325 MG PO TABS: 1.0000 | ORAL_TABLET | Freq: Three times a day (TID) | ORAL | 0 refills | Status: DC | PRN

## 2023-09-02 NOTE — Telephone Encounter (Signed)
 Dr. Areatha pt - spoke w/the pt, he is requesting a refill for Hydrocodone  10-325, 60 tablets, every 8hours PRN to be sent to [facility]

## 2023-10-11 ENCOUNTER — Other Ambulatory Visit: Payer: Self-pay | Admitting: Family Medicine

## 2023-10-28 ENCOUNTER — Other Ambulatory Visit: Payer: Self-pay | Admitting: Family Medicine

## 2023-10-28 DIAGNOSIS — H59032 Cystoid macular edema following cataract surgery, left eye: Secondary | ICD-10-CM | POA: Diagnosis not present

## 2023-11-01 ENCOUNTER — Ambulatory Visit: Admitting: Orthopedic Surgery

## 2023-11-02 ENCOUNTER — Other Ambulatory Visit: Payer: Self-pay | Admitting: Orthopedic Surgery

## 2023-11-02 DIAGNOSIS — M170 Bilateral primary osteoarthritis of knee: Secondary | ICD-10-CM

## 2023-11-05 ENCOUNTER — Encounter: Admitting: Family Medicine

## 2023-11-08 ENCOUNTER — Encounter: Payer: Self-pay | Admitting: Radiology

## 2023-11-11 ENCOUNTER — Ambulatory Visit: Admitting: Orthopedic Surgery

## 2023-11-16 ENCOUNTER — Ambulatory Visit (INDEPENDENT_AMBULATORY_CARE_PROVIDER_SITE_OTHER)

## 2023-11-16 DIAGNOSIS — Z23 Encounter for immunization: Secondary | ICD-10-CM

## 2023-11-18 ENCOUNTER — Ambulatory Visit: Admitting: Orthopedic Surgery

## 2023-11-22 ENCOUNTER — Ambulatory Visit: Admitting: Orthopedic Surgery

## 2023-12-07 ENCOUNTER — Other Ambulatory Visit: Payer: Self-pay | Admitting: Orthopedic Surgery

## 2023-12-07 DIAGNOSIS — M170 Bilateral primary osteoarthritis of knee: Secondary | ICD-10-CM

## 2023-12-07 MED ORDER — HYDROCODONE-ACETAMINOPHEN 10-325 MG PO TABS: 1.0000 | ORAL_TABLET | Freq: Three times a day (TID) | ORAL | 0 refills | Status: DC | PRN

## 2023-12-07 NOTE — Telephone Encounter (Signed)
 Refill request received via fax for Hydrocodone  [facility]

## 2024-01-11 ENCOUNTER — Other Ambulatory Visit: Payer: Self-pay | Admitting: Orthopedic Surgery

## 2024-01-11 DIAGNOSIS — M170 Bilateral primary osteoarthritis of knee: Secondary | ICD-10-CM

## 2024-01-11 NOTE — Telephone Encounter (Signed)
 Dr. Areatha pt - pt lvm requesting a refill for Hydrocodone  10-325, 60 tablets, every 8 hours PRN.

## 2024-01-12 MED ORDER — HYDROCODONE-ACETAMINOPHEN 10-325 MG PO TABS: 1.0000 | ORAL_TABLET | Freq: Three times a day (TID) | ORAL | 0 refills | Status: AC | PRN

## 2024-01-20 ENCOUNTER — Encounter: Payer: Self-pay | Admitting: Orthopedic Surgery

## 2024-01-20 ENCOUNTER — Ambulatory Visit: Admitting: Orthopedic Surgery

## 2024-01-20 DIAGNOSIS — M170 Bilateral primary osteoarthritis of knee: Secondary | ICD-10-CM | POA: Diagnosis not present

## 2024-01-20 DIAGNOSIS — F1190 Opioid use, unspecified, uncomplicated: Secondary | ICD-10-CM | POA: Diagnosis not present

## 2024-01-20 DIAGNOSIS — G8929 Other chronic pain: Secondary | ICD-10-CM | POA: Diagnosis not present

## 2024-01-20 MED ORDER — HYDROCODONE-ACETAMINOPHEN 10-325 MG PO TABS: 1.0000 | ORAL_TABLET | Freq: Three times a day (TID) | ORAL | 0 refills | Status: AC | PRN

## 2024-01-20 NOTE — Progress Notes (Signed)
" ° ° °  01/20/2024   Chief Complaint  Patient presents with   Knee Pain    Bilateral     No diagnosis found.  What pharmacy do you use ? ___[facility]________  DOI/DOS/ Date:    Did you get better, worse or no change (Answer below)   Unchanged      "

## 2024-01-20 NOTE — Progress Notes (Signed)
" ° °  Chief Complaint  Patient presents with   Knee Pain    Bilateral     Encounter Diagnoses  Name Primary?   Primary osteoarthritis of both knees    Other chronic pain Yes   Chronic, continuous use of opioids    Encounter for chronic pain management    Opioid use      Encounter for chronic pain and opioid management  Diagnosis chronic pain from osteoarthritis both knees  The patient is caring for his wife who is a total care patient  He takes hydrocodone  for pain and it manages his pain well and increases his function and overall activities of daily living and allows him to take care of his wife  He denies any problems with the medication or side effects    Knee Pain     PHYSICAL EXAM:  Flexion contractures noted in the right and left knee approximately 5 degrees the right knee flexes up to 110 degrees the left 115 degrees with crepitance   Assessment and Plan:   Stable chronic pain from osteoarthritis of the knee  Medication refill   Meds ordered this encounter  Medications   HYDROcodone -acetaminophen  (NORCO) 10-325 MG tablet    Sig: Take 1 tablet by mouth every 8 (eight) hours as needed.    Dispense:  60 tablet    Refill:  0   Follow-up in 3 months "

## 2024-01-24 ENCOUNTER — Other Ambulatory Visit: Payer: Self-pay | Admitting: Family Medicine

## 2024-02-10 ENCOUNTER — Other Ambulatory Visit: Payer: Self-pay | Admitting: Gastroenterology

## 2024-02-10 DIAGNOSIS — K219 Gastro-esophageal reflux disease without esophagitis: Secondary | ICD-10-CM

## 2024-02-10 NOTE — Telephone Encounter (Signed)
 Two years since last seen. Needs ov. Limited refills provided.

## 2024-02-18 ENCOUNTER — Ambulatory Visit: Admitting: Family Medicine

## 2024-03-14 ENCOUNTER — Ambulatory Visit: Admitting: Family Medicine

## 2024-04-20 ENCOUNTER — Ambulatory Visit: Admitting: Orthopedic Surgery
